# Patient Record
Sex: Female | Born: 1935 | Race: Black or African American | Hispanic: No | Smoking: Current some day smoker
Health system: Southern US, Community
[De-identification: ages and names within clinical notes are randomized; demographics above are authoritative.]

## PROBLEM LIST (undated history)

## (undated) DIAGNOSIS — Z5189 Encounter for other specified aftercare: Secondary | ICD-10-CM

## (undated) DIAGNOSIS — Z9289 Personal history of other medical treatment: Secondary | ICD-10-CM

## (undated) DIAGNOSIS — D649 Anemia, unspecified: Secondary | ICD-10-CM

## (undated) DIAGNOSIS — I340 Nonrheumatic mitral (valve) insufficiency: Secondary | ICD-10-CM

## (undated) DIAGNOSIS — I429 Cardiomyopathy, unspecified: Secondary | ICD-10-CM

## (undated) DIAGNOSIS — N183 Chronic kidney disease, stage 3 unspecified: Secondary | ICD-10-CM

## (undated) DIAGNOSIS — I351 Nonrheumatic aortic (valve) insufficiency: Secondary | ICD-10-CM

## (undated) DIAGNOSIS — I10 Essential (primary) hypertension: Secondary | ICD-10-CM

## (undated) DIAGNOSIS — I509 Heart failure, unspecified: Secondary | ICD-10-CM

## (undated) DIAGNOSIS — C50919 Malignant neoplasm of unspecified site of unspecified female breast: Secondary | ICD-10-CM

## (undated) HISTORY — DX: Personal history of other medical treatment: Z92.89

## (undated) HISTORY — DX: Encounter for other specified aftercare: Z51.89

## (undated) HISTORY — DX: Malignant neoplasm of unspecified site of unspecified female breast: C50.919

## (undated) HISTORY — DX: Nonrheumatic mitral (valve) insufficiency: I34.0

## (undated) HISTORY — DX: Cardiomyopathy, unspecified: I42.9

## (undated) HISTORY — DX: Nonrheumatic aortic (valve) insufficiency: I35.1

## (undated) HISTORY — PX: MASTECTOMY: SHX3

## (undated) SURGERY — VIDEO BRONCHOSCOPY WITHOUT FLUORO
Anesthesia: General

---

## 2002-12-27 ENCOUNTER — Encounter (INDEPENDENT_AMBULATORY_CARE_PROVIDER_SITE_OTHER): Payer: Self-pay | Admitting: *Deleted

## 2002-12-27 LAB — CONVERTED CEMR LAB

## 2004-06-24 ENCOUNTER — Encounter: Admission: RE | Admit: 2004-06-24 | Discharge: 2004-06-24 | Payer: Self-pay | Admitting: Family Medicine

## 2004-07-09 ENCOUNTER — Encounter: Admission: RE | Admit: 2004-07-09 | Discharge: 2004-07-09 | Payer: Self-pay | Admitting: Family Medicine

## 2004-08-10 ENCOUNTER — Encounter: Admission: RE | Admit: 2004-08-10 | Discharge: 2004-08-10 | Payer: Self-pay | Admitting: Family Medicine

## 2004-09-28 ENCOUNTER — Ambulatory Visit: Payer: Self-pay | Admitting: Family Medicine

## 2004-11-18 ENCOUNTER — Emergency Department (HOSPITAL_COMMUNITY): Admission: EM | Admit: 2004-11-18 | Discharge: 2004-11-18 | Payer: Self-pay | Admitting: Emergency Medicine

## 2005-01-25 ENCOUNTER — Ambulatory Visit: Payer: Self-pay | Admitting: Family Medicine

## 2005-02-27 ENCOUNTER — Emergency Department (HOSPITAL_COMMUNITY): Admission: EM | Admit: 2005-02-27 | Discharge: 2005-02-28 | Payer: Self-pay | Admitting: Emergency Medicine

## 2005-05-24 ENCOUNTER — Emergency Department (HOSPITAL_COMMUNITY): Admission: EM | Admit: 2005-05-24 | Discharge: 2005-05-24 | Payer: Self-pay | Admitting: Emergency Medicine

## 2007-02-23 DIAGNOSIS — I10 Essential (primary) hypertension: Secondary | ICD-10-CM

## 2007-02-23 DIAGNOSIS — H269 Unspecified cataract: Secondary | ICD-10-CM | POA: Insufficient documentation

## 2007-02-24 ENCOUNTER — Encounter (INDEPENDENT_AMBULATORY_CARE_PROVIDER_SITE_OTHER): Payer: Self-pay | Admitting: *Deleted

## 2007-06-13 ENCOUNTER — Other Ambulatory Visit: Admission: RE | Admit: 2007-06-13 | Discharge: 2007-06-13 | Payer: Self-pay | Admitting: Nephrology

## 2007-06-14 ENCOUNTER — Encounter: Admission: RE | Admit: 2007-06-14 | Discharge: 2007-06-14 | Payer: Self-pay | Admitting: Nephrology

## 2008-07-05 ENCOUNTER — Encounter: Admission: RE | Admit: 2008-07-05 | Discharge: 2008-07-05 | Payer: Self-pay | Admitting: Nephrology

## 2008-12-06 ENCOUNTER — Encounter: Admission: RE | Admit: 2008-12-06 | Discharge: 2008-12-06 | Payer: Self-pay | Admitting: Nephrology

## 2010-03-12 ENCOUNTER — Encounter: Admission: RE | Admit: 2010-03-12 | Discharge: 2010-03-12 | Payer: Self-pay | Admitting: Nephrology

## 2010-03-16 ENCOUNTER — Emergency Department (HOSPITAL_COMMUNITY): Admission: EM | Admit: 2010-03-16 | Discharge: 2010-03-16 | Payer: Self-pay | Admitting: Emergency Medicine

## 2010-05-05 ENCOUNTER — Ambulatory Visit: Payer: Self-pay | Admitting: Vascular Surgery

## 2010-11-27 ENCOUNTER — Encounter: Admission: RE | Admit: 2010-11-27 | Discharge: 2010-11-27 | Payer: Self-pay | Admitting: Nephrology

## 2011-05-08 ENCOUNTER — Emergency Department (HOSPITAL_COMMUNITY)
Admission: EM | Admit: 2011-05-08 | Discharge: 2011-05-08 | Disposition: A | Discharge status: Home / Self Care | Payer: Medicare Other | Attending: Emergency Medicine | Admitting: Emergency Medicine

## 2011-05-08 ENCOUNTER — Emergency Department (HOSPITAL_COMMUNITY): Payer: Medicare Other

## 2011-05-08 DIAGNOSIS — R071 Chest pain on breathing: Secondary | ICD-10-CM | POA: Insufficient documentation

## 2011-05-08 DIAGNOSIS — R0789 Other chest pain: Secondary | ICD-10-CM | POA: Insufficient documentation

## 2011-05-08 LAB — DIFFERENTIAL
Basophils Absolute: 0 10*3/uL (ref 0.0–0.1)
Basophils Relative: 0 % (ref 0–1)
Eosinophils Absolute: 0.1 10*3/uL (ref 0.0–0.7)
Eosinophils Relative: 1 % (ref 0–5)
Lymphocytes Relative: 19 % (ref 12–46)
Lymphs Abs: 1.6 10*3/uL (ref 0.7–4.0)
Monocytes Absolute: 0.6 10*3/uL (ref 0.1–1.0)
Monocytes Relative: 8 % (ref 3–12)
Neutro Abs: 6.2 10*3/uL (ref 1.7–7.7)
Neutrophils Relative %: 73 % (ref 43–77)

## 2011-05-08 LAB — TROPONIN I
Troponin I: <0.3 ng/mL (ref <0.30)
Troponin I: <0.3 ng/mL (ref <0.30)

## 2011-05-08 LAB — POCT I-STAT, CHEM 8
BUN: 17 mg/dL (ref 6–23)
Calcium, Ion: 1.23 mmol/L (ref 1.12–1.32)
Chloride: 105 mEq/L (ref 96–112)
Creatinine, Ser: 1 mg/dL (ref 0.4–1.2)
Glucose, Bld: 115 mg/dL — ABNORMAL HIGH (ref 70–99)
HCT: 34 % — ABNORMAL LOW (ref 36.0–46.0)
Hemoglobin: 11.6 g/dL — ABNORMAL LOW (ref 12.0–15.0)
Potassium: 4.3 mEq/L (ref 3.5–5.1)
Sodium: 141 mEq/L (ref 135–145)
TCO2: 29 mmol/L (ref 0–100)

## 2011-05-08 LAB — CBC
HCT: 33.2 % — ABNORMAL LOW (ref 36.0–46.0)
Hemoglobin: 11 g/dL — ABNORMAL LOW (ref 12.0–15.0)
MCH: 29.3 pg (ref 26.0–34.0)
MCHC: 33.1 g/dL (ref 30.0–36.0)
MCV: 88.5 fL (ref 78.0–100.0)
Platelets: 212 10*3/uL (ref 150–400)
RBC: 3.75 MIL/uL — ABNORMAL LOW (ref 3.87–5.11)
RDW: 13.7 % (ref 11.5–15.5)
WBC: 8.4 10*3/uL (ref 4.0–10.5)

## 2011-05-08 LAB — POCT CARDIAC MARKERS
CKMB, poc: 1.3 ng/mL (ref 1.0–8.0)
Myoglobin, poc: 74.5 ng/mL (ref 12–200)
Troponin i, poc: 0.19 ng/mL — ABNORMAL HIGH (ref 0.00–0.09)

## 2011-05-08 LAB — CK TOTAL AND CKMB (NOT AT ARMC)
CK, MB: 2 ng/mL (ref 0.3–4.0)
CK, MB: 2.1 ng/mL (ref 0.3–4.0)
Relative Index: INVALID (ref 0.0–2.5)
Relative Index: INVALID (ref 0.0–2.5)
Total CK: 76 U/L (ref 7–177)
Total CK: 75 U/L (ref 7–177)

## 2011-05-08 LAB — D-DIMER, QUANTITATIVE: D-Dimer, Quant: 1.59 ug/mL-FEU — ABNORMAL HIGH (ref 0.00–0.48)

## 2011-05-08 MED ORDER — IOHEXOL 300 MG/ML  SOLN
60.0000 mL | Freq: Once | INTRAMUSCULAR | Status: AC | PRN
  Administered 2011-05-08: 60 mL via INTRAVENOUS

## 2011-05-11 NOTE — Consult Note (Signed)
NEW PATIENT CONSULTATION   Downs, Valerie E  DOB:  Aug 03, 1930                                       05/05/2010  ZOXWR#:[id number]   The patient is a 77 year old female referred by Dr. Suzette Battiest for  circulation evaluation.  She has a fungus problem in the right first  toenail and needs removal of the nail.  He was concerned about her  healing potential.  She denies any claudication symptoms but does not  ambulate long distances.  She denies history of rest pain, nonhealing  ulcers, infection or other vascular problems.   CHRONIC MEDICAL PROBLEMS:  1. Proteinuria followed by Dr. Bascom Levels.  2. Previous left mastectomy for cancer.  3. Negative for diabetes, COPD, hypertension, hyperlipidemia, coronary      artery disease or stroke.   FAMILY HISTORY:  Negative for coronary artery disease, diabetes and  stroke.   PAST SURGICAL HISTORY:  Includes left mastectomy and cataract surgery.   SOCIAL HISTORY:  The patient has 10 children.  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  She smoked 4-5 cigarettes per day most of her  life until 18 months ago when she quit.  She does not use alcohol.   REVIEW OF SYSTEMS:  Positive for weight loss.  Negative for anorexia.  Negative for chest pain, dyspnea on exertion, wheezing, chronic  bronchitis.  Does have some kidney problems followed by Dr. Bascom Levels with  proteinuria, muscle pain.  All other systems in the review of systems  are negative.   PHYSICAL EXAMINATION:  Vital signs:  Blood pressure 148/73, heart rate  76, temperature 98.  General:  She is a well-developed, well-nourished  elderly female in no apparent distress.  Alert and oriented x3.  HEENT:  Exam is normal.  EOMs intact.  Lungs:  Clear to auscultation.  No  rhonchi or wheezing.  Cardiovascular:  Regular rhythm.  No murmurs.  Carotid pulses 3+.  No audible bruits.  Abdomen:  Soft, nontender with  no masses.  She has 3+ femoral, 2+ popliteal and 2+ dorsalis pedis  pulses palpable bilaterally.  Both feet are well-perfused.  There is no  distal edema noted.  Musculoskeletal:  Exam is free of major  deformities.  Neurological:  Normal.  Skin:  Free of rashes.  She does  have a hypertrophic nail of her left first toe but no evidence of  adjacent cellulitis is noted.   Today I ordered lower extremity arterial Doppler studies which I  reviewed and interpreted.  She has biphasic flow in posterior tibial  artery of both ankles with ABIs of 0.96 on the left and 0.94 on the  right.   I have reassured her regarding these findings.  I do think her  circulation is adequate for removal of the left first toe nail as much  as can be determined and would be happy to see her in the future as  necessary.     Quita Skye Hart [place], M.D.  Electronically Signed   JDL/MEDQ  D:  05/05/2010  T:  05/06/2010  Job:  [id number]   cc:   Nicole Kindred, M.D.

## 2011-06-17 ENCOUNTER — Ambulatory Visit
Admission: RE | Admit: 2011-06-17 | Discharge: 2011-06-17 | Disposition: A | Discharge status: Home / Self Care | Payer: Medicare Other | Source: Ambulatory Visit | Attending: Nephrology | Admitting: Nephrology

## 2011-06-17 ENCOUNTER — Other Ambulatory Visit: Payer: Self-pay | Admitting: Nephrology

## 2011-06-17 DIAGNOSIS — R52 Pain, unspecified: Secondary | ICD-10-CM

## 2013-02-16 ENCOUNTER — Emergency Department (HOSPITAL_COMMUNITY): Payer: Medicare Other

## 2013-02-16 ENCOUNTER — Encounter (HOSPITAL_COMMUNITY): Payer: Self-pay | Admitting: *Deleted

## 2013-02-16 ENCOUNTER — Emergency Department (HOSPITAL_COMMUNITY)
Admission: EM | Admit: 2013-02-16 | Discharge: 2013-02-16 | Disposition: A | Discharge status: Home / Self Care | Payer: Medicare Other | Attending: Emergency Medicine | Admitting: Emergency Medicine

## 2013-02-16 DIAGNOSIS — R29898 Other symptoms and signs involving the musculoskeletal system: Secondary | ICD-10-CM | POA: Insufficient documentation

## 2013-02-16 DIAGNOSIS — Z853 Personal history of malignant neoplasm of breast: Secondary | ICD-10-CM | POA: Insufficient documentation

## 2013-02-16 DIAGNOSIS — G8929 Other chronic pain: Secondary | ICD-10-CM | POA: Insufficient documentation

## 2013-02-16 DIAGNOSIS — M25562 Pain in left knee: Secondary | ICD-10-CM

## 2013-02-16 DIAGNOSIS — M25569 Pain in unspecified knee: Secondary | ICD-10-CM | POA: Insufficient documentation

## 2013-02-16 DIAGNOSIS — Z87891 Personal history of nicotine dependence: Secondary | ICD-10-CM | POA: Insufficient documentation

## 2013-02-16 LAB — CBC WITH DIFFERENTIAL/PLATELET
Basophils Relative: 0 % (ref 0–1)
Eosinophils Absolute: 0.1 10*3/uL (ref 0.0–0.7)
Eosinophils Relative: 1 % (ref 0–5)
HCT: 31.8 % — ABNORMAL LOW (ref 36.0–46.0)
Hemoglobin: 10.1 g/dL — ABNORMAL LOW (ref 12.0–15.0)
MCH: 28.5 pg (ref 26.0–34.0)
MCHC: 31.8 g/dL (ref 30.0–36.0)
MCV: 89.8 fL (ref 78.0–100.0)
Monocytes Absolute: 0.6 10*3/uL (ref 0.1–1.0)
Monocytes Relative: 12 % (ref 3–12)
RDW: 13.9 % (ref 11.5–15.5)

## 2013-02-16 LAB — D-DIMER, QUANTITATIVE: D-Dimer, Quant: 3.36 ug{FEU}/mL — ABNORMAL HIGH (ref 0.00–0.48)

## 2013-02-16 LAB — POCT I-STAT, CHEM 8
BUN: 21 mg/dL (ref 6–23)
Calcium, Ion: 1.28 mmol/L (ref 1.13–1.30)
Creatinine, Ser: 1.1 mg/dL (ref 0.50–1.10)
Glucose, Bld: 94 mg/dL (ref 70–99)
Sodium: 141 mEq/L (ref 135–145)
TCO2: 26 mmol/L (ref 0–100)

## 2013-02-16 LAB — PROTIME-INR
INR: 1.07 (ref 0.00–1.49)
Prothrombin Time: 13.8 s (ref 11.6–15.2)

## 2013-02-16 MED ORDER — OXYCODONE-ACETAMINOPHEN 5-325 MG PO TABS
1.0000 | ORAL_TABLET | Freq: Once | ORAL | Status: AC
  Administered 2013-02-16: 1 via ORAL
  Filled 2013-02-16: qty 1

## 2013-02-16 MED ORDER — OXYCODONE-ACETAMINOPHEN 5-325 MG PO TABS: 1.0000 | ORAL_TABLET | Freq: Four times a day (QID) | ORAL | Status: DC | PRN

## 2013-02-16 NOTE — ED Notes (Signed)
Pt reports chronic knee pain, today pt reports knot formed behind left knee and pain 8/10.

## 2013-02-16 NOTE — ED Provider Notes (Signed)
History     CSN: [id number]  Arrival date & time 02/16/13  1652   First MD Initiated Contact with Patient 02/16/13 1957      Chief Complaint  Patient presents with  . Knee Pain    (Consider location/radiation/quality/duration/timing/severity/associated sxs/prior treatment) HPI This 79 year old female has chronic left knee pain intermittently, for several months she has had intermittent left knee pain pushed in her for days to weeks at a time, sometimes her left knee does not hurt at all but quite often it does, she is a flare up of left knee pain for last several days, today she felt a small nontender lump in her popliteal region and her family was concerned about a blood clots she came to the ED, she is able to walk with a cane with a limp, she is no weakness or numbness to the leg there is no color change or swelling to the leg, she has no thigh pain or calf pain, she is no edema to the leg, she is no recent immobilization, she is no chest pain shortness of breath lightheadedness abdominal pain or other concerns. There is no treatment prior to arrival. Past Medical History  Diagnosis Date  . Cancer     breast    Past Surgical History  Procedure Laterality Date  . Mastectomy      left    History reviewed. No pertinent family history.  History  Substance Use Topics  . Smoking status: Former Games developer  . Smokeless tobacco: Not on file  . Alcohol Use: No    OB History   Grav Para Term Preterm Abortions TAB SAB Ect Mult Living                  Review of Systems 10 Systems reviewed and are negative for acute change except as noted in the HPI. Allergies  Penicillins  Home Medications   Current Outpatient Rx  Name  Route  Sig  Dispense  Refill  . oxyCODONE-acetaminophen (PERCOCET) 5-325 MG per tablet   Oral   Take 1 tablet by mouth every 6 (six) hours as needed for pain.   15 tablet   0     BP 148/71  Pulse 72  Temp(Src) 98.1 F (36.7 C) (Oral)  Resp 20  SpO2  96%  Physical Exam  Nursing note and vitals reviewed. Constitutional:  Awake, alert, nontoxic appearance.  HENT:  Head: Atraumatic.  Eyes: Right eye exhibits no discharge. Left eye exhibits no discharge.  Neck: Neck supple.  Pulmonary/Chest: Effort normal. She exhibits no tenderness.  Abdominal: Soft. There is no tenderness. There is no rebound.  Musculoskeletal: She exhibits tenderness. She exhibits no edema.  Baseline ROM, no obvious new focal weakness. Both arms and right leg are nontender. Left leg is nontender at the thigh calf ankle and foot. Left leg has minimal tenderness to the knee medially as well as laterally with no swelling appreciated except for a subcutaneous nodule in the popliteal region approximately 2-3 cm diameter which is nontender, there is no erythema to the skin of the knee, she has negative Lachman's testing negative McMurray's testing no laxity with varus or valgus stress testing full extension as well as good flexion past 90, her calves are symmetric, left foot his dorsalis pedis pulse intact, cap refill less than 2 seconds normal light touch good movement with good strength in her left leg  Neurological: She is alert.  Mental status and motor strength appears baseline for patient and situation.  Skin: No rash noted.  Psychiatric: She has a normal mood and affect.    ED Course  Procedures (including critical care time)  Labs Reviewed  D-DIMER, QUANTITATIVE - Abnormal; Notable for the following:    D-Dimer, Quant 3.36 (*)    All other components within normal limits  CBC WITH DIFFERENTIAL - Abnormal; Notable for the following:    RBC 3.54 (*)    Hemoglobin 10.1 (*)    HCT 31.8 (*)    Neutrophils Relative 38 (*)    Lymphocytes Relative 49 (*)    All other components within normal limits  POCT I-STAT, CHEM 8 - Abnormal; Notable for the following:    Hemoglobin 10.9 (*)    HCT 32.0 (*)    All other components within normal limits  PROTIME-INR   No  results found.   1. Left knee pain       MDM   Pt stable in ED with no significant deterioration in condition.  Patient / Family / Caregiver informed of clinical course, understand medical decision-making process, and agree with plan. I doubt any other EMC precluding discharge at this time including, but not necessarily limited to the following:DVT, SBI.      Hurman Horn, MD 02/24/13 2207

## 2013-02-16 NOTE — ED Notes (Signed)
MD at bedside. 

## 2013-02-17 ENCOUNTER — Emergency Department (HOSPITAL_COMMUNITY)
Admission: EM | Admit: 2013-02-17 | Discharge: 2013-02-17 | Discharge status: Against Medical Advice | Payer: Medicare Other

## 2013-02-17 ENCOUNTER — Ambulatory Visit (HOSPITAL_COMMUNITY)
Admission: RE | Admit: 2013-02-17 | Discharge: 2013-02-17 | Disposition: A | Discharge status: Home / Self Care | Payer: Medicare Other | Source: Ambulatory Visit | Attending: Emergency Medicine | Admitting: Emergency Medicine

## 2013-02-17 DIAGNOSIS — M7989 Other specified soft tissue disorders: Secondary | ICD-10-CM | POA: Insufficient documentation

## 2013-02-17 DIAGNOSIS — M79609 Pain in unspecified limb: Secondary | ICD-10-CM

## 2013-02-19 NOTE — Progress Notes (Signed)
VASCULAR LAB PRELIMINARY  PRELIMINARY  PRELIMINARY  PRELIMINARY  Left lower extremity venous Doppler completed.    Preliminary report:  There is no DVT or SVT noted in the left lower extremity.  Irvin Bastin, RVT 02/19/2013, 10:23 AM

## 2013-07-26 ENCOUNTER — Inpatient Hospital Stay (HOSPITAL_COMMUNITY)
Admission: EM | Admit: 2013-07-26 | Discharge: 2013-07-29 | DRG: 292 | Disposition: A | Discharge status: Home / Self Care | Payer: Medicare Other | Attending: Internal Medicine | Admitting: Internal Medicine

## 2013-07-26 ENCOUNTER — Emergency Department (HOSPITAL_COMMUNITY): Payer: Medicare Other

## 2013-07-26 ENCOUNTER — Encounter (HOSPITAL_COMMUNITY): Payer: Self-pay | Admitting: Emergency Medicine

## 2013-07-26 DIAGNOSIS — I10 Essential (primary) hypertension: Secondary | ICD-10-CM | POA: Diagnosis present

## 2013-07-26 DIAGNOSIS — D649 Anemia, unspecified: Secondary | ICD-10-CM

## 2013-07-26 DIAGNOSIS — Z880 Allergy status to penicillin: Secondary | ICD-10-CM

## 2013-07-26 DIAGNOSIS — I43 Cardiomyopathy in diseases classified elsewhere: Secondary | ICD-10-CM | POA: Diagnosis present

## 2013-07-26 DIAGNOSIS — I110 Hypertensive heart disease with heart failure: Principal | ICD-10-CM | POA: Diagnosis present

## 2013-07-26 DIAGNOSIS — Z87891 Personal history of nicotine dependence: Secondary | ICD-10-CM

## 2013-07-26 DIAGNOSIS — R42 Dizziness and giddiness: Secondary | ICD-10-CM | POA: Diagnosis present

## 2013-07-26 DIAGNOSIS — D62 Acute posthemorrhagic anemia: Secondary | ICD-10-CM | POA: Insufficient documentation

## 2013-07-26 DIAGNOSIS — Z853 Personal history of malignant neoplasm of breast: Secondary | ICD-10-CM

## 2013-07-26 DIAGNOSIS — Z9010 Acquired absence of unspecified breast and nipple: Secondary | ICD-10-CM

## 2013-07-26 DIAGNOSIS — N390 Urinary tract infection, site not specified: Secondary | ICD-10-CM | POA: Diagnosis present

## 2013-07-26 DIAGNOSIS — I509 Heart failure, unspecified: Secondary | ICD-10-CM | POA: Diagnosis present

## 2013-07-26 DIAGNOSIS — Z8249 Family history of ischemic heart disease and other diseases of the circulatory system: Secondary | ICD-10-CM

## 2013-07-26 DIAGNOSIS — E440 Moderate protein-calorie malnutrition: Secondary | ICD-10-CM | POA: Diagnosis present

## 2013-07-26 DIAGNOSIS — I5021 Acute systolic (congestive) heart failure: Secondary | ICD-10-CM | POA: Diagnosis present

## 2013-07-26 LAB — URINALYSIS, ROUTINE W REFLEX MICROSCOPIC
Nitrite: NEGATIVE
Specific Gravity, Urine: 1.022 (ref 1.005–1.030)
Urobilinogen, UA: 1 mg/dL (ref 0.0–1.0)

## 2013-07-26 LAB — CBC WITH DIFFERENTIAL/PLATELET
Basophils Absolute: 0 10*3/uL (ref 0.0–0.1)
Eosinophils Absolute: 0 10*3/uL (ref 0.0–0.7)
Eosinophils Relative: 1 % (ref 0–5)
Eosinophils Relative: 1 % (ref 0–5)
Lymphocytes Relative: 27 % (ref 12–46)
Lymphs Abs: 2.6 10*3/uL (ref 0.7–4.0)
MCH: 26.5 pg (ref 26.0–34.0)
MCHC: 30.6 g/dL (ref 30.0–36.0)
MCV: 86.4 fL (ref 78.0–100.0)
MCV: 86.8 fL (ref 78.0–100.0)
Platelets: 245 10*3/uL (ref 150–400)
Platelets: 237 10*3/uL (ref 150–400)
RBC: 2.87 MIL/uL — ABNORMAL LOW (ref 3.87–5.11)
RDW: 14.9 % (ref 11.5–15.5)
WBC: 6.4 10*3/uL (ref 4.0–10.5)

## 2013-07-26 LAB — COMPREHENSIVE METABOLIC PANEL
ALT: 11 U/L (ref 0–35)
AST: 15 U/L (ref 0–37)
Albumin: 2.6 g/dL — ABNORMAL LOW (ref 3.5–5.2)
BUN: 14 mg/dL (ref 6–23)
CO2: 26 mEq/L (ref 19–32)
Calcium: 8.9 mg/dL (ref 8.4–10.5)
Calcium: 8.6 mg/dL (ref 8.4–10.5)
GFR calc Af Amer: 68 mL/min — ABNORMAL LOW (ref >90)
GFR calc non Af Amer: 57 mL/min — ABNORMAL LOW (ref >90)
Glucose, Bld: 81 mg/dL (ref 70–99)
Sodium: 139 mEq/L (ref 135–145)
Sodium: 140 mEq/L (ref 135–145)
Total Protein: 6.5 g/dL (ref 6.0–8.3)
Total Protein: 6.4 g/dL (ref 6.0–8.3)

## 2013-07-26 LAB — ABO/RH: ABO/RH(D): O POS

## 2013-07-26 LAB — PROTIME-INR: INR: 1.13 (ref 0.00–1.49)

## 2013-07-26 LAB — OCCULT BLOOD, POC DEVICE: Fecal Occult Bld: NEGATIVE

## 2013-07-26 LAB — PRO B NATRIURETIC PEPTIDE: Pro B Natriuretic peptide (BNP): 13350 pg/mL — ABNORMAL HIGH (ref 0–450)

## 2013-07-26 LAB — MRSA PCR SCREENING: MRSA by PCR: NEGATIVE

## 2013-07-26 LAB — PREPARE RBC (CROSSMATCH)

## 2013-07-26 LAB — TROPONIN I
Troponin I: <0.3 ng/mL (ref <0.30)
Troponin I: <0.3 ng/mL (ref <0.30)

## 2013-07-26 LAB — URINE MICROSCOPIC-ADD ON

## 2013-07-26 LAB — MAGNESIUM: Magnesium: 1.7 mg/dL (ref 1.5–2.5)

## 2013-07-26 MED ORDER — METOPROLOL TARTRATE 12.5 MG HALF TABLET
12.5000 mg | ORAL_TABLET | Freq: Two times a day (BID) | ORAL | Status: DC
  Administered 2013-07-26 – 2013-07-29 (×6): 12.5 mg via ORAL
  Filled 2013-07-26 (×8): qty 1

## 2013-07-26 MED ORDER — HYDRALAZINE HCL 20 MG/ML IJ SOLN
10.0000 mg | Freq: Four times a day (QID) | INTRAMUSCULAR | Status: DC | PRN
  Administered 2013-07-27: 10 mg via INTRAVENOUS
  Filled 2013-07-26: qty 1

## 2013-07-26 MED ORDER — ONDANSETRON HCL 4 MG/2ML IJ SOLN
4.0000 mg | Freq: Once | INTRAMUSCULAR | Status: AC
  Administered 2013-07-26: 4 mg via INTRAVENOUS
  Filled 2013-07-26: qty 2

## 2013-07-26 MED ORDER — ACETAMINOPHEN 325 MG PO TABS: 650.0000 mg | ORAL_TABLET | ORAL | Status: DC | PRN

## 2013-07-26 MED ORDER — LEVOFLOXACIN IN D5W 750 MG/150ML IV SOLN
750.0000 mg | Freq: Once | INTRAVENOUS | Status: AC
  Administered 2013-07-26: 750 mg via INTRAVENOUS
  Filled 2013-07-26: qty 150

## 2013-07-26 MED ORDER — LEVOFLOXACIN IN D5W 750 MG/150ML IV SOLN
750.0000 mg | INTRAVENOUS | Status: DC
  Filled 2013-07-26: qty 150

## 2013-07-26 MED ORDER — FUROSEMIDE 10 MG/ML IJ SOLN
40.0000 mg | Freq: Two times a day (BID) | INTRAMUSCULAR | Status: DC
  Administered 2013-07-27 – 2013-07-28 (×3): 40 mg via INTRAVENOUS
  Filled 2013-07-26 (×6): qty 4

## 2013-07-26 MED ORDER — SODIUM CHLORIDE 0.9 % IJ SOLN
3.0000 mL | Freq: Two times a day (BID) | INTRAMUSCULAR | Status: DC
  Administered 2013-07-27 – 2013-07-29 (×5): 3 mL via INTRAVENOUS

## 2013-07-26 MED ORDER — SODIUM CHLORIDE 0.9 % IV BOLUS (SEPSIS)
500.0000 mL | Freq: Once | INTRAVENOUS | Status: AC
  Administered 2013-07-26: 1000 mL via INTRAVENOUS

## 2013-07-26 MED ORDER — ONDANSETRON HCL 4 MG/2ML IJ SOLN: 4.0000 mg | Freq: Four times a day (QID) | INTRAMUSCULAR | Status: DC | PRN

## 2013-07-26 MED ORDER — FUROSEMIDE 10 MG/ML IJ SOLN
40.0000 mg | Freq: Once | INTRAMUSCULAR | Status: AC
  Administered 2013-07-26: 40 mg via INTRAVENOUS
  Filled 2013-07-26: qty 4

## 2013-07-26 MED ORDER — POTASSIUM CHLORIDE CRYS ER 20 MEQ PO TBCR
20.0000 meq | EXTENDED_RELEASE_TABLET | Freq: Every day | ORAL | Status: DC
  Filled 2013-07-26: qty 1

## 2013-07-26 MED ORDER — LISINOPRIL 5 MG PO TABS
5.0000 mg | ORAL_TABLET | Freq: Every day | ORAL | Status: DC
  Administered 2013-07-27: 5 mg via ORAL
  Filled 2013-07-26 (×2): qty 1

## 2013-07-26 MED ORDER — SODIUM CHLORIDE 0.9 % IJ SOLN
3.0000 mL | INTRAMUSCULAR | Status: DC | PRN
  Administered 2013-07-29: 3 mL via INTRAVENOUS

## 2013-07-26 MED ORDER — SODIUM CHLORIDE 0.9 % IV SOLN: 250.0000 mL | INTRAVENOUS | Status: DC | PRN

## 2013-07-26 NOTE — ED Notes (Signed)
Pt c/o nausea, dizziness, and weakness x1 month.  Denies vomiting.  Reports SOB x2 weeks.  Denies cardiac hx.

## 2013-07-26 NOTE — ED Notes (Signed)
Received page for room@[id number] 

## 2013-07-26 NOTE — Progress Notes (Signed)
Patient does not have a computer at home. Pt does not want to sign up for My Chart. She wants to think about family member being a proxy for her. Briscoe Burns BSN, RN-BC Admissions RN  07/26/2013 8:26 PM

## 2013-07-26 NOTE — ED Provider Notes (Signed)
CSN: [id number]     Arrival date & time 07/26/13  1633 History     First MD Initiated Contact with Patient 07/26/13 1648     Chief Complaint  Patient presents with  . Nausea  . Dizziness   (Consider location/radiation/quality/duration/timing/severity/associated sxs/prior Treatment) HPI Comments: 79 yo female with breast CA hx (not active) presents with intermittent nausea, weakness and lightheaded for one month.  Pt has not seen a physician. No heart hx. Wt loss.  SOB is more general fatigue, at times exertional.  Pt has had a few episodes of atypical anterior cp lasting 5 minutes. Sxs not necessarily with exertion.  NO diaphoresis.  No headache.  NO fevers.  IMproves with time.  Pt still has gb.  The history is provided by the patient.    Past Medical History  Diagnosis Date  . Cancer     breast   Past Surgical History  Procedure Laterality Date  . Mastectomy      left   No family history on file. History  Substance Use Topics  . Smoking status: Former Games developer  . Smokeless tobacco: Not on file  . Alcohol Use: No   OB History   Grav Para Term Preterm Abortions TAB SAB Ect Mult Living                 Review of Systems  Constitutional: Positive for appetite change and fatigue. Negative for fever and chills.  HENT: Negative for neck pain.   Eyes: Negative for visual disturbance.  Respiratory: Positive for shortness of breath. Cough: not currently.   Cardiovascular: Negative for chest pain.  Gastrointestinal: Positive for nausea. Negative for vomiting, abdominal pain and blood in stool.  Genitourinary: Negative for dysuria and flank pain.  Musculoskeletal: Negative for back pain.  Skin: Negative for rash.  Neurological: Positive for weakness and light-headedness. Negative for headaches.    Allergies  Penicillins  Home Medications  No current outpatient prescriptions on file. BP 174/69  Pulse 86  Temp(Src) 99.2 F (37.3 C) (Oral)  Resp 14  SpO2 100% Physical  Exam  Nursing note and vitals reviewed. Constitutional: She is oriented to person, place, and time. She appears well-developed and well-nourished.  HENT:  Head: Normocephalic and atraumatic.  Dry mm mild  Eyes: Conjunctivae are normal. Right eye exhibits no discharge. Left eye exhibits no discharge.  Neck: Normal range of motion. Neck supple. No tracheal deviation present.  Cardiovascular: Normal rate and regular rhythm.   Pulmonary/Chest: Effort normal. She has rales (crackles at bases).  Abdominal: Soft. She exhibits no distension. There is no tenderness. There is no guarding.  Musculoskeletal: She exhibits no edema and no tenderness.  Neurological: She is alert and oriented to person, place, and time. No cranial nerve deficit.  Skin: Skin is warm. No rash noted.  Psychiatric: She has a normal mood and affect.    ED Course   Procedures (including critical care time)   EMERGENCY DEPARTMENT [place] CARDIAC EXAM "Study: Limited Ultrasound of the heart and pericardium"  INDICATIONS:Dyspnea Multiple views of the heart and pericardium are obtained with a multi-frequency probe.  PERFORMED BM:WUXLKG  IMAGES ARCHIVED?: Yes  FINDINGS: Small effusion and Decreased contractility  LIMITATIONS:  none  VIEWS USED: Subcostal 4 chamber, Parasternal long axis, Parasternal short axis and Apical 4 chamber   INTERPRETATION: Grossly decr EF with slight cardiac effusion   CRITICAL CARE Performed by: Enid Skeens   Total critical care time: 40 min  Critical care time was exclusive  of separately billable procedures and treating other patients.  Critical care was necessary to treat or prevent imminent or life-threatening deterioration.  Critical care was time spent personally by me on the following activities: development of treatment plan with patient and/or surrogate as well as nursing, discussions with consultants, evaluation of patient's response to treatment, examination of patient,  obtaining history from patient or surrogate, ordering and performing treatments and interventions, ordering and review of laboratory studies, ordering and review of radiographic studies, pulse oximetry and re-evaluation of patient's condition.   Labs Reviewed  CBC WITH DIFFERENTIAL - Abnormal; Notable for the following:    RBC 2.87 (*)    Hemoglobin 7.6 (*)    HCT 24.8 (*)    All other components within normal limits  COMPREHENSIVE METABOLIC PANEL - Abnormal; Notable for the following:    Albumin 2.7 (*)    GFR calc non Af Amer 58 (*)    GFR calc Af Amer 68 (*)    All other components within normal limits  URINALYSIS, ROUTINE W REFLEX MICROSCOPIC - Abnormal; Notable for the following:    APPearance CLOUDY (*)    Hgb urine dipstick TRACE (*)    Protein, ur >300 (*)    Leukocytes, UA MODERATE (*)    All other components within normal limits  URINE MICROSCOPIC-ADD ON - Abnormal; Notable for the following:    Squamous Epithelial / LPF FEW (*)    Bacteria, UA MANY (*)    Casts HYALINE CASTS (*)    All other components within normal limits  PRO B NATRIURETIC PEPTIDE - Abnormal; Notable for the following:    Pro B Natriuretic peptide (BNP) 13354.0 (*)    All other components within normal limits  URINE CULTURE  TROPONIN I  OCCULT BLOOD X 1 CARD TO LAB, STOOL  OCCULT BLOOD, POC DEVICE  PREPARE RBC (CROSSMATCH)  TYPE AND SCREEN   No results found. No diagnosis found.  MDM   Multiple vague sxs from patient.  General weakness/ fatigue is main sx. General workup revealed new onset CHF, anemia without any recent GI bleeding. Pt comfortable on recheck.  Discussed multiple findings. Lasix given in ED.   Date: 07/26/2013  Rate: *78  Rhythm: normal sinus rhythm  QRS Axis: indeterminate  Intervals: QT prolonged  ST/T Wave abnormalities: nonspecific ST changes  Conduction Disutrbances:none  PVCs  Narrative Interpretation:   Old EKG Reviewed: similar   Bedside US very mild  pericardial effusion- no clinically significant, LVH and grossly decr EF. Paged cardiology and hospitalist.  Ordered one unit of blood to be given slowly over 4 hrs.   UTI - culture and abx.  Multiple discussions with family, cardiology and hospitalist.  Pt comfortable on recheck and prefers to stay locally. Discussed with [place].  Dg Chest 2 View  07/26/2013   *RADIOLOGY REPORT*  Clinical Data: Nausea, dizziness, prior breast cancer  CHEST - 2 VIEW  Comparison: 06/17/2011  Findings: Heart is enlarged with vascular congestion and interstitial prominence versus early developing edema.  No large effusion or pneumothorax.  No collapse consolidation.  No definite pneumonia.  Postop changes from left mastectomy and axillary lymph node dissection.  IMPRESSION: Cardiomegaly with vascular congestion versus early edema   Original Report Authenticated By: Judie Petit. Miles Costain, M.D.      Enid Skeens, MD 07/26/13 2107

## 2013-07-26 NOTE — Progress Notes (Addendum)
ANTIBIOTIC CONSULT NOTE - INITIAL  Pharmacy Consult for Levaquin Indication: UTI  Allergies  Allergen Reactions  . Penicillins Swelling    Patient Measurements:   Adjusted Body Weight:   Vital Signs: Temp: 99.2 F (37.3 C) (07/31 1645) Temp src: Oral (07/31 1645) BP: 144/67 mmHg (07/31 2047) Pulse Rate: 84 (07/31 2047) Intake/Output from previous day:   Intake/Output from this shift:    Labs:  Recent Labs  07/26/13 1745  WBC 7.5  HGB 7.6*  PLT 245  CREATININE 0.90   CrCl is unknown because there is no height on file for the current visit. No results found for this basename: VANCOTROUGH, VANCOPEAK, VANCORANDOM, GENTTROUGH, GENTPEAK, GENTRANDOM, TOBRATROUGH, TOBRAPEAK, TOBRARND, AMIKACINPEAK, AMIKACINTROU, AMIKACIN,  in the last 72 hours   Microbiology: No results found for this or any previous visit (from the past 720 hour(s)).  Medical History: Past Medical History  Diagnosis Date  . Cancer     breast   Assessment: 6 yoF with hx breast CA presents with 1 month of nausea, weakness, and lightheadedness. U/A positive for bacteria, leukocytes.  Pharmacy consulted to dose levaquin for UTI.  First dose levaquin already given.    D#1 Antibiotics 7/31 >> Levaquin >>  No weight available yet in [facility].   Renal: SCr 0.90, normalized CrCl = 56 WBC WNL Afebrile  Cultures:   7/31 Urine collected  Plan:  1.  Will follow-up weight and CrCl and order maintenance dose tomorrow (8/1) when CrCl known.    Haynes Hoehn, PharmD 07/26/2013 9:26 PM  Pager: [phone]  Addendum:  Wt 66.6 kg.  CrCl 44 ml/min.  Will order Levaquin 750mg  IV q 48 hours.

## 2013-07-26 NOTE — H&P (Addendum)
[facility] History and Physical  Valerie Downs FAO:[id number] DOB: 10-24-1930 DOA: 07/26/2013  Referring physician: ER physician PCP: User [facility]ricity, MD   Chief Complaint: weakness   HPI:  79 year old female with past medical history of breast cancer status post left mastectomy who presented to [facility] ED for evaluation of an ongoing progressive weakness, fatigue and shortness of breath for past 1 month prior to this admission. Patient reports experiencing shortness of breath on and off mostly pronounced with activity but present at time even at rest. Patient reported no associated fevers or chills or productive cough. Patient did endorse few episodes of intermittent substernal chest pain, about 4-5/10 in intensity lasting few minutes at the time and spontaneously resolving. She had associated nausea but no vomiting. No reports of blood in stool or urine. No diarrhea or constipation. No loss of consciousness. In ED, BP was 174/69 with HR of 86, Tmax 99.2 F and O2 saturation of 93% on room air. CBC revealed hemoglobin of 7.6 and FOBT was negative. Urinalysis was significant for many bacteria. Additionally, the patient was found to have BNP of 13,354 and bedside ECHO preliminary report showed reduced EF. CXR showed cardiomegaly with vascular congestion. Patient was given 1 dose of lasix 40 mg IV in ED and cardiology was consulted by ED physician. Patient preferred to stay in [facility] so recommended admission to stepdown unit.   Assessment and Plan:  Principal Problem:   Acute decompensated heart failure - BNP on this admission is 13,354 - CHF order set in place - started lasix 40 mg Q 12 hours; strict intake and output and daily weight; potassium supplementation ordered however low dose as potassium is now at the lower limits of normal range; replete as needed - also ordered lisinopril 5 mg daily and low dose metoprolol 12.5 mg BID (BP 174/69 and HR 86) - obtain 12 lead EKG and follow up 2 D ECHO; no  previous 2 D ECHO on file - cycle cardiac enzymes for total of 3 sets - follow up TSH level and repeat BNP  - appreciate cardiology consult and recommendations - admission to stepdown unit  Active Problems:   Acute blood loss anemia - FOBT negative, unclear etiology of anemia - hemoglobin is 7.6 on this admission - 1 unit PRBC transfusion started in ED   UTI (lower urinary tract infection) - PCN allergy; will continue Levaquin started in ED - follow up urine culture results   HYPERTENSION, BENIGN SYSTEMIC - started low dose lisinopril and metoprolol - monitor renal function  - appreciate cardiology recomendations  Manson Passey [facility] [phone]  Review of Systems:  Constitutional: Negative for fever, chills and positive for malaise/fatigue. Negative for diaphoresis.  HENT: Negative for hearing loss, ear pain, nosebleeds, congestion, sore throat, neck pain, tinnitus and ear discharge.   Eyes: Negative for blurred vision, double vision, photophobia, pain, discharge and redness.  Respiratory: Negative for cough, hemoptysis, sputum production, positive for shortness of breath, no wheezing and stridor.   Cardiovascular: positive for chest pain, no palpitations, orthopnea, claudication and leg swelling.  Gastrointestinal: positive  for nausea, negative for vomiting and abdominal pain. Negative for heartburn, constipation, blood in stool and melena.  Genitourinary: Negative for dysuria, urgency, frequency, hematuria and flank pain.  Musculoskeletal: Negative for myalgias, back pain, joint pain and falls.  Skin: Negative for itching and rash.  Neurological: Negative for dizziness and positive for weakness. Negative for tingling, tremors, sensory change, speech change, focal weakness, loss of consciousness and headaches.  Endo/Heme/Allergies: Negative for environmental allergies and polydipsia. Does not bruise/bleed easily.  Psychiatric/Behavioral: Negative for suicidal ideas. The patient is not  nervous/anxious.      Past Medical History  Diagnosis Date  . Cancer     breast   Past Surgical History  Procedure Laterality Date  . Mastectomy      left   Social History:  reports that she quit smoking about 13 months ago. Her smoking use included Cigarettes. She smoked 0.00 packs per day for 50 years. Her smokeless tobacco use includes Snuff. She reports that she does not drink alcohol or use illicit drugs.  Allergies  Allergen Reactions  . Penicillins Swelling    Family History: Family medical history significant for HTN, HLD   Prior to Admission medications   Not on File   Physical Exam: Filed Vitals:   07/26/13 1645 07/26/13 1912 07/26/13 2047  BP: 174/69 169/74 144/67  Pulse: 86 85 84  Temp: 99.2 F (37.3 C)    TempSrc: Oral    Resp: 14 18 16   SpO2: 100% 93% 93%    Physical Exam  Constitutional: Appears well-developed and well-nourished. No distress.  HENT: Normocephalic. External right and left ear normal. Oropharynx is clear and moist.  Eyes: Conjunctivae and EOM are normal. PERRLA, no scleral icterus.  Neck: Normal ROM. Neck supple. No JVD. No tracheal deviation. No thyromegaly.  CVS: RRR, S1/S2 appreciate, basilar crackles Pulmonary: Effort and breath sounds normal, no stridor, rhonchi, wheezes, rales.  Abdominal: Soft. BS +,  no distension, tenderness, rebound or guarding.  Musculoskeletal: Normal range of motion. Trace LE edema and no tenderness.  Lymphadenopathy: No lymphadenopathy noted, cervical, inguinal. Neuro: Alert. No focal neurologic deficits Skin: Skin is warm and dry. No rash noted. Not diaphoretic. No erythema. No pallor.  Psychiatric: Normal mood and affect. Behavior, judgment, thought content normal.   Labs on Admission:  Basic Metabolic Panel:  Recent Labs Lab 07/26/13 1745  NA 139  K 3.5  CL 104  CO2 26  GLUCOSE 81  BUN 14  CREATININE 0.90  CALCIUM 8.9   Liver Function Tests:  Recent Labs Lab 07/26/13 1745  AST 29   ALT 15  ALKPHOS 74  BILITOT 0.6  PROT 6.5  ALBUMIN 2.7*   No results found for this basename: LIPASE, AMYLASE,  in the last 168 hours No results found for this basename: AMMONIA,  in the last 168 hours CBC:  Recent Labs Lab 07/26/13 1745  WBC 7.5  NEUTROABS 4.1  HGB 7.6*  HCT 24.8*  MCV 86.4  PLT 245   Cardiac Enzymes:  Recent Labs Lab 07/26/13 1745  TROPONINI <0.30   BNP: No components found with this basename: POCBNP,  CBG: No results found for this basename: GLUCAP,  in the last 168 hours  Radiological Exams on Admission: Dg Chest 2 View 07/26/2013   * IMPRESSION: Cardiomegaly with vascular congestion versus early edema   Original Report Authenticated By: Judie Petit. Miles Costain, M.D.    Code Status: Full Family Communication: Pt at bedside Disposition Plan: Admit for further evaluation  Manson Passey, MD  [facility] Pager [phone]  If 7PM-7AM, please contact night-coverage [email or web address] Password [email or web address] 07/26/2013, 9:15 PM

## 2013-07-27 DIAGNOSIS — D649 Anemia, unspecified: Secondary | ICD-10-CM

## 2013-07-27 DIAGNOSIS — E440 Moderate protein-calorie malnutrition: Secondary | ICD-10-CM

## 2013-07-27 DIAGNOSIS — I359 Nonrheumatic aortic valve disorder, unspecified: Secondary | ICD-10-CM

## 2013-07-27 LAB — CBC
MCH: 26.8 pg (ref 26.0–34.0)
MCHC: 30.8 g/dL (ref 30.0–36.0)
Platelets: 248 10*3/uL (ref 150–400)
RDW: 14.9 % (ref 11.5–15.5)

## 2013-07-27 LAB — BASIC METABOLIC PANEL
CO2: 26 mEq/L (ref 19–32)
Calcium: 8.8 mg/dL (ref 8.4–10.5)
Creatinine, Ser: 0.97 mg/dL (ref 0.50–1.10)
GFR calc Af Amer: 62 mL/min — ABNORMAL LOW (ref >90)

## 2013-07-27 LAB — TYPE AND SCREEN

## 2013-07-27 LAB — IRON AND TIBC
Iron: 32 ug/dL — ABNORMAL LOW (ref 42–135)
Saturation Ratios: 12 % — ABNORMAL LOW (ref 20–55)
TIBC: 274 ug/dL (ref 250–470)
UIBC: 242 ug/dL (ref 125–400)

## 2013-07-27 LAB — RETICULOCYTES: Retic Count, Absolute: 62.3 10*3/uL (ref 19.0–186.0)

## 2013-07-27 LAB — TSH: TSH: 1.007 u[IU]/mL (ref 0.350–4.500)

## 2013-07-27 LAB — FERRITIN: Ferritin: 19 ng/mL (ref 10–291)

## 2013-07-27 MED ORDER — CLONIDINE HCL 0.1 MG PO TABS
0.1000 mg | ORAL_TABLET | Freq: Every day | ORAL | Status: AC
  Administered 2013-07-27: 0.1 mg via ORAL
  Filled 2013-07-27: qty 1

## 2013-07-27 MED ORDER — POTASSIUM CHLORIDE CRYS ER 20 MEQ PO TBCR
40.0000 meq | EXTENDED_RELEASE_TABLET | Freq: Every day | ORAL | Status: DC
  Administered 2013-07-27 – 2013-07-29 (×3): 40 meq via ORAL
  Filled 2013-07-27 (×3): qty 2

## 2013-07-27 MED ORDER — ENSURE COMPLETE PO LIQD
237.0000 mL | Freq: Two times a day (BID) | ORAL | Status: DC
  Administered 2013-07-28 – 2013-07-29 (×3): 237 mL via ORAL

## 2013-07-27 MED ORDER — CLONIDINE HCL 0.1 MG PO TABS
0.1000 mg | ORAL_TABLET | Freq: Two times a day (BID) | ORAL | Status: AC
  Filled 2013-07-27: qty 1

## 2013-07-27 MED ORDER — POTASSIUM CHLORIDE CRYS ER 20 MEQ PO TBCR
40.0000 meq | EXTENDED_RELEASE_TABLET | Freq: Once | ORAL | Status: AC
  Administered 2013-07-27: 40 meq via ORAL
  Filled 2013-07-27: qty 2

## 2013-07-27 NOTE — Progress Notes (Signed)
INITIAL NUTRITION ASSESSMENT  DOCUMENTATION CODES Per approved criteria  -Non-severe (moderate) malnutrition in the context of chronic illness   INTERVENTION: Ensure Complete po BID, each supplement provides 350 kcal and 13 grams of protein. Assisted patient with meal ordering. Provide preferences within diet order Encourage intake Instructed on low sodium diet.  Teach back method used.  Written info provided with RD name and number.   NUTRITION DIAGNOSIS: Inadequate oral intake related to poor appetite as evidenced by patient report.   Goal: Intake of >/=90% of estimated needs with po meals and supplements.    Monitor:  Intake, labs, weight  Reason for Assessment: MST  79 y.o. female  Admitting Dx: Acute decompensated heart failure  ASSESSMENT: Patient admitted with CHF, UTI and normocytic anemia.  Patient reports regular diet with little added salt prior to admit.  Decreased intake of <50% of meals for greater than one month due to decreased appetite.  Was not taking any supplements.  UBW unknown.  Patient lives alone and states that she can prepare her own food but has not been hungry.  Patient meets criteria for moderate malnutrition related to chronic illness AEB intake <50% for>1 month and decreased muscle mass and body fat.    Nutrition Focused Physical Exam:  Subcutaneous Fat:  Orbital Region: moderate Upper Arm Region: moderate Thoracic and Lumbar Region: moderate  Muscle:  Temple Region: mild Clavicle Bone Region: wnl Clavicle and Acromion Bone Region: wnl Scapular Bone Region: mild Dorsal Hand: wnl Patellar Region: wnl Anterior Thigh Region: mod Posterior Calf Region: n/a  Edema: chf, no edema lower extremities.   Height: Ht Readings from Last 1 Encounters:  07/26/13 5\' 2"  (1.575 m)    Weight: Wt Readings from Last 1 Encounters:  07/27/13 144 lb 2.9 oz (65.4 kg)    Ideal Body Weight: 110 lbs  % Ideal Body Weight:  131  Wt Readings from  Last 10 Encounters:  07/27/13 144 lb 2.9 oz (65.4 kg)    Usual Body Weight: unknown  % Usual Body Weight: unknown  BMI:  Body mass index is 26.36 kg/(m^2).  Estimated Nutritional Needs: Kcal: 1400-1500 Protein: 60-70 gm Fluid: >1.4L  Skin: wnl  Diet Order: Cardiac  EDUCATION NEEDS: -Education needs addressed   Intake/Output Summary (Last 24 hours) at 07/27/13 1310 Last data filed at 07/27/13 1000  Gross per 24 hour  Intake  437.5 ml  Output   1550 ml  Net -1112.5 ml      Labs:   Recent Labs Lab 07/26/13 1745 07/26/13 2245 07/27/13 0424  NA 139 140 140  K 3.5 2.9* 3.2*  CL 104 105 105  CO2 26 26 26   BUN 14 14 14   CREATININE 0.90 0.92 0.97  CALCIUM 8.9 8.6 8.8  MG  --  1.7  --   GLUCOSE 81 90 91    CBG (last 3)  No results found for this basename: GLUCAP,  in the last 72 hours  Scheduled Meds: . cloNIDine  0.1 mg Oral BID  . furosemide  40 mg Intravenous Q12H  . [START ON 07/28/2013] levofloxacin (LEVAQUIN) IV  750 mg Intravenous Q48H  . lisinopril  5 mg Oral Daily  . metoprolol tartrate  12.5 mg Oral Q12H  . potassium chloride  40 mEq Oral Daily  . sodium chloride  3 mL Intravenous Q12H    Continuous Infusions:   Past Medical History  Diagnosis Date  . Cancer     breast    Past Surgical History  Procedure  Laterality Date  . Mastectomy      left    Oran Rein, RD, LDN Clinical Inpatient Dietitian Pager:  [phone] Weekend and after hours pager:  [phone]

## 2013-07-27 NOTE — Progress Notes (Addendum)
[facility] PROGRESS NOTE  Valerie Downs ZOX:[id number] DOB: 1930-09-22 DOA: 07/26/2013 PCP: User Centricity, MD  Assessment/Plan: 1. CHF exacerbation -continue IV lasix 40mg  Q12 -2D echo pending -strict I/Os -daily weights -replete K -continue metoprolol and ACE -FU TSH  2. Normocytic anemia -transfused 1 unit PRBC overnight -Hemoccult negative -check anemia panel  3. HTN:  -continue ACE/metoprolol and clonidine  4. H/o breast CA s/p mastectomy  5. UTi -on ceftriaxone, Fu cultures  DVT proph: SCDs  Code Status: FUll Family Communication: none at bedside Disposition Plan: Tx to tele   Consultants:    HPI/Subjective: Breathing ok, no specific complaints  Objective: Filed Vitals:   07/27/13 0600 07/27/13 0654 07/27/13 0700 07/27/13 0800  BP: 159/75  165/89 167/73  Pulse: 76 80 77 73  Temp:    98.5 F (36.9 C)  TempSrc:    Oral  Resp: 22 22 23 16   Height:      Weight:  65.4 kg (144 lb 2.9 oz)    SpO2: 96% 96% 99% 95%    Intake/Output Summary (Last 24 hours) at 07/27/13 1220 Last data filed at 07/27/13 1000  Gross per 24 hour  Intake  437.5 ml  Output   1550 ml  Net -1112.5 ml   Filed Weights   07/26/13 2200 07/27/13 0654  Weight: 66.6 kg (146 lb 13.2 oz) 65.4 kg (144 lb 2.9 oz)    Exam:   General:AAOx3, no distress  Cardiovascular: S1S2/RRR  Respiratory:basilar crackles  Abdomen: soft, NT, BS present  Musculoskeletal: no edema c/c   Data Reviewed: Basic Metabolic Panel:  Recent Labs Lab 07/26/13 1745 07/26/13 2245 07/27/13 0424  NA 139 140 140  K 3.5 2.9* 3.2*  CL 104 105 105  CO2 26 26 26   GLUCOSE 81 90 91  BUN 14 14 14   CREATININE 0.90 0.92 0.97  CALCIUM 8.9 8.6 8.8  MG  --  1.7  --    Liver Function Tests:  Recent Labs Lab 07/26/13 1745 07/26/13 2245  AST 29 15  ALT 15 11  ALKPHOS 74 77  BILITOT 0.6 0.6  PROT 6.5 6.4  ALBUMIN 2.7* 2.6*   No results found for this basename: LIPASE, AMYLASE,  in the last  168 hours No results found for this basename: AMMONIA,  in the last 168 hours CBC:  Recent Labs Lab 07/26/13 1745 07/26/13 2245 07/27/13 0910  WBC 7.5 6.4 6.6  NEUTROABS 4.1 3.9  --   HGB 7.6* 7.7* 8.8*  HCT 24.8* 24.9* 28.6*  MCV 86.4 86.8 87.2  PLT 245 237 248   Cardiac Enzymes:  Recent Labs Lab 07/26/13 1745 07/26/13 2245 07/27/13 0424  TROPONINI <0.30 <0.30 <0.30   BNP (last 3 results)  Recent Labs  07/26/13 1745 07/26/13 2245  PROBNP 13354.0* 13350.0*   CBG: No results found for this basename: GLUCAP,  in the last 168 hours  Recent Results (from the past 240 hour(s))  MRSA PCR SCREENING     Status: None   Collection Time    07/26/13 10:17 PM      Result Value Range Status   MRSA by PCR NEGATIVE  NEGATIVE Final   Comment:            The GeneXpert MRSA Assay (FDA     approved for NASAL specimens     only), is one component of a     comprehensive MRSA colonization     surveillance program. It is not     intended to diagnose  MRSA     infection nor to guide or     monitor treatment for     MRSA infections.     Studies: Dg Chest 2 View  07/26/2013   *RADIOLOGY REPORT*  Clinical Data: Nausea, dizziness, prior breast cancer  CHEST - 2 VIEW  Comparison: 06/17/2011  Findings: Heart is enlarged with vascular congestion and interstitial prominence versus early developing edema.  No large effusion or pneumothorax.  No collapse consolidation.  No definite pneumonia.  Postop changes from left mastectomy and axillary lymph node dissection.  IMPRESSION: Cardiomegaly with vascular congestion versus early edema   Original Report Authenticated By: Judie Petit. Shick, M.D.    Scheduled Meds: . furosemide  40 mg Intravenous Q12H  . [START ON 07/28/2013] levofloxacin (LEVAQUIN) IV  750 mg Intravenous Q48H  . lisinopril  5 mg Oral Daily  . metoprolol tartrate  12.5 mg Oral Q12H  . potassium chloride  40 mEq Oral Daily  . sodium chloride  3 mL Intravenous Q12H   Continuous  Infusions:   Principal Problem:   Acute decompensated heart failure Active Problems:   HYPERTENSION, BENIGN SYSTEMIC   Acute blood loss anemia   UTI (lower urinary tract infection)    Time spent:    [facility]  [facility] Pager [phone]. If 7PM-7AM, please contact night-coverage at [email or web address], password [facility] 07/27/2013, 12:20 PM  LOS: 1 day

## 2013-07-27 NOTE — Progress Notes (Signed)
Pt and son watched CHF video #102.

## 2013-07-27 NOTE — Progress Notes (Signed)
Echo Lab  2D Echocardiogram completed.  Dennette Faulconer L Onika Gudiel, RDCS 07/27/2013 2:09 PM

## 2013-07-27 NOTE — Progress Notes (Signed)
  [id number]/Joshuah Minella, RN, BSN, CCN: [phone]/ Case management. Chart reviewed for discharge planning and present needs. Discharge needs: none present at time of review. Next chart review due:  [id number] 

## 2013-07-28 DIAGNOSIS — E440 Moderate protein-calorie malnutrition: Secondary | ICD-10-CM | POA: Diagnosis present

## 2013-07-28 LAB — CBC
HCT: 27.8 % — ABNORMAL LOW (ref 36.0–46.0)
Hemoglobin: 8.5 g/dL — ABNORMAL LOW (ref 12.0–15.0)
MCHC: 30.6 g/dL (ref 30.0–36.0)
MCV: 87.7 fL (ref 78.0–100.0)
RDW: 15 % (ref 11.5–15.5)

## 2013-07-28 LAB — BASIC METABOLIC PANEL
BUN: 20 mg/dL (ref 6–23)
CO2: 26 mEq/L (ref 19–32)
Chloride: 104 mEq/L (ref 96–112)
Creatinine, Ser: 1.27 mg/dL — ABNORMAL HIGH (ref 0.50–1.10)
Glucose, Bld: 105 mg/dL — ABNORMAL HIGH (ref 70–99)
Potassium: 3.9 mEq/L (ref 3.5–5.1)

## 2013-07-28 LAB — URINE CULTURE: Culture: NO GROWTH

## 2013-07-28 MED ORDER — LIVING BETTER WITH HEART FAILURE BOOK
Freq: Once | Status: AC
  Administered 2013-07-28: 10:00:00
  Filled 2013-07-28: qty 1

## 2013-07-28 MED ORDER — FUROSEMIDE 40 MG PO TABS
40.0000 mg | ORAL_TABLET | Freq: Two times a day (BID) | ORAL | Status: DC
  Administered 2013-07-28 – 2013-07-29 (×2): 40 mg via ORAL
  Filled 2013-07-28 (×4): qty 1

## 2013-07-28 MED ORDER — LEVOFLOXACIN 250 MG PO TABS
250.0000 mg | ORAL_TABLET | Freq: Every day | ORAL | Status: DC
  Administered 2013-07-28: 250 mg via ORAL
  Filled 2013-07-28: qty 1

## 2013-07-28 MED ORDER — FERROUS SULFATE 325 (65 FE) MG PO TABS
325.0000 mg | ORAL_TABLET | Freq: Two times a day (BID) | ORAL | Status: DC
  Administered 2013-07-28 – 2013-07-29 (×2): 325 mg via ORAL
  Filled 2013-07-28 (×4): qty 1

## 2013-07-28 MED ORDER — POLYETHYLENE GLYCOL 3350 17 G PO PACK
17.0000 g | PACK | Freq: Two times a day (BID) | ORAL | Status: DC | PRN
  Filled 2013-07-28: qty 1

## 2013-07-28 NOTE — Progress Notes (Addendum)
[facility] PROGRESS NOTE  Valerie Downs ZOX:[id number] DOB: [birth year] DOA: 07/26/2013 PCP: User Centricity, MD  Assessment/Plan: 1. CHF exacerbation -change IV lasix  To PO 40mg  Q12 -2D echo with EF of 45% diffuse hypokinesis and severe LVH suggestive of hypertensive cardiomyopathy -strict I/Os -daily weights -replete K -continue metoprolol and ACE -FU TSH -FU with [place] cardiology  2. Normocytic anemia -transfused 1 unit PRBC 7/31 -Hemoccult negative -Anemia panel c/w Iron deficiency -start PO Fe, h/o colonoscopy 12 years ago, needs outpatient eval  3. HTN:  -continue ACE/metoprolol and clonidine  4. H/o breast CA s/p mastectomy  5. UTi -Dc abx, cultures negative  DVT proph: SCDs  Code Status: FUll Family Communication: none at bedside Disposition Plan: Tx to tele   Consultants:    HPI/Subjective: Breathing ok, no specific complaints  Objective: Filed Vitals:   07/27/13 1713 07/27/13 2207 07/28/13 0432 07/28/13 1318  BP: 116/62 136/64 122/57 125/59  Pulse: 67 74 64 71  Temp: 98.3 F (36.8 C) 98.2 F (36.8 C) 98.4 F (36.9 C) 98.2 F (36.8 C)  TempSrc: Oral Oral Oral Oral  Resp: 15 18 18 18   Height: 5\' 2"  (1.575 m)     Weight: 66.679 kg (147 lb)     SpO2: 100% 100% 98% 100%    Intake/Output Summary (Last 24 hours) at 07/28/13 1742 Last data filed at 07/28/13 1400  Gross per 24 hour  Intake    720 ml  Output   2150 ml  Net  -1430 ml   Filed Weights   07/26/13 2200 07/27/13 0654 07/27/13 1713  Weight: 66.6 kg (146 lb 13.2 oz) 65.4 kg (144 lb 2.9 oz) 66.679 kg (147 lb)    Exam:   General:AAOx3, no distress  Cardiovascular: S1S2/RRR  Respiratory:basilar crackles  Abdomen: soft, NT, BS present  Musculoskeletal: no edema c/c   Data Reviewed: Basic Metabolic Panel:  Recent Labs Lab 07/26/13 1745 07/26/13 2245 07/27/13 0424 07/28/13 0600  NA 139 140 140 137  K 3.5 2.9* 3.2* 3.9  CL 104 105 105 104  CO2 26 26 26 26    GLUCOSE 81 90 91 105*  BUN 14 14 14 20   CREATININE 0.90 0.92 0.97 1.27*  CALCIUM 8.9 8.6 8.8 9.3  MG  --  1.7  --   --    Liver Function Tests:  Recent Labs Lab 07/26/13 1745 07/26/13 2245  AST 29 15  ALT 15 11  ALKPHOS 74 77  BILITOT 0.6 0.6  PROT 6.5 6.4  ALBUMIN 2.7* 2.6*   No results found for this basename: LIPASE, AMYLASE,  in the last 168 hours No results found for this basename: AMMONIA,  in the last 168 hours CBC:  Recent Labs Lab 07/26/13 1745 07/26/13 2245 07/27/13 0910 07/28/13 0600  WBC 7.5 6.4 6.6 6.7  NEUTROABS 4.1 3.9  --   --   HGB 7.6* 7.7* 8.8* 8.5*  HCT 24.8* 24.9* 28.6* 27.8*  MCV 86.4 86.8 87.2 87.7  PLT 245 237 248 216   Cardiac Enzymes:  Recent Labs Lab 07/26/13 1745 07/26/13 2245 07/27/13 0424  TROPONINI <0.30 <0.30 <0.30   BNP (last 3 results)  Recent Labs  07/26/13 1745 07/26/13 2245  PROBNP 13354.0* 13350.0*   CBG: No results found for this basename: GLUCAP,  in the last 168 hours  Recent Results (from the past 240 hour(s))  URINE CULTURE     Status: None   Collection Time    07/26/13  5:47 PM  Result Value Range Status   Specimen Description URINE, CLEAN CATCH   Final   Special Requests NONE   Final   Culture  Setup Time 07/27/2013 07:18   Final   Colony Count NO GROWTH   Final   Culture NO GROWTH   Final   Report Status 07/28/2013 FINAL   Final  MRSA PCR SCREENING     Status: None   Collection Time    07/26/13 10:17 PM      Result Value Range Status   MRSA by PCR NEGATIVE  NEGATIVE Final   Comment:            The GeneXpert MRSA Assay (FDA     approved for NASAL specimens     only), is one component of a     comprehensive MRSA colonization     surveillance program. It is not     intended to diagnose MRSA     infection nor to guide or     monitor treatment for     MRSA infections.     Studies: Dg Chest 2 View  07/26/2013   *RADIOLOGY REPORT*  Clinical Data: Nausea, dizziness, prior breast cancer   CHEST - 2 VIEW  Comparison: 06/17/2011  Findings: Heart is enlarged with vascular congestion and interstitial prominence versus early developing edema.  No large effusion or pneumothorax.  No collapse consolidation.  No definite pneumonia.  Postop changes from left mastectomy and axillary lymph node dissection.  IMPRESSION: Cardiomegaly with vascular congestion versus early edema   Original Report Authenticated By: Judie Petit. Shick, M.D.    Scheduled Meds: . feeding supplement  237 mL Oral BID BM  . ferrous sulfate  325 mg Oral BID WC  . furosemide  40 mg Oral BID  . levofloxacin  250 mg Oral Daily  . metoprolol tartrate  12.5 mg Oral Q12H  . potassium chloride  40 mEq Oral Daily  . sodium chloride  3 mL Intravenous Q12H   Continuous Infusions:   Principal Problem:   Acute decompensated heart failure Active Problems:   HYPERTENSION, BENIGN SYSTEMIC   Acute blood loss anemia   UTI (lower urinary tract infection)   Malnutrition of moderate degree    Time spent:    [facility]  [facility] Pager [phone]. If 7PM-7AM, please contact night-coverage at [email or web address], password [facility] 07/28/2013, 5:42 PM  LOS: 2 days

## 2013-07-28 NOTE — Progress Notes (Addendum)
1500-Report received from N. Broadnax,RN; no change in assessment. Valerie Downs 

## 2013-07-29 DIAGNOSIS — N390 Urinary tract infection, site not specified: Secondary | ICD-10-CM

## 2013-07-29 DIAGNOSIS — E440 Moderate protein-calorie malnutrition: Secondary | ICD-10-CM

## 2013-07-29 DIAGNOSIS — I5021 Acute systolic (congestive) heart failure: Secondary | ICD-10-CM

## 2013-07-29 DIAGNOSIS — I509 Heart failure, unspecified: Secondary | ICD-10-CM

## 2013-07-29 LAB — CBC
HCT: 28.9 % — ABNORMAL LOW (ref 36.0–46.0)
Hemoglobin: 8.8 g/dL — ABNORMAL LOW (ref 12.0–15.0)
MCH: 26.7 pg (ref 26.0–34.0)
MCHC: 30.4 g/dL (ref 30.0–36.0)
RDW: 14.9 % (ref 11.5–15.5)

## 2013-07-29 LAB — BASIC METABOLIC PANEL
BUN: 20 mg/dL (ref 6–23)
Creatinine, Ser: 1.15 mg/dL — ABNORMAL HIGH (ref 0.50–1.10)
GFR calc Af Amer: 50 mL/min — ABNORMAL LOW (ref >90)
GFR calc non Af Amer: 43 mL/min — ABNORMAL LOW (ref >90)
Glucose, Bld: 91 mg/dL (ref 70–99)

## 2013-07-29 MED ORDER — METOPROLOL TARTRATE 12.5 MG HALF TABLET: 12.5000 mg | ORAL_TABLET | Freq: Two times a day (BID) | ORAL | Status: DC

## 2013-07-29 MED ORDER — FUROSEMIDE 40 MG PO TABS: 40.0000 mg | ORAL_TABLET | Freq: Every day | ORAL | Status: DC

## 2013-07-29 MED ORDER — POTASSIUM CHLORIDE CRYS ER 20 MEQ PO TBCR: 20.0000 meq | EXTENDED_RELEASE_TABLET | Freq: Every day | ORAL | Status: DC

## 2013-07-29 MED ORDER — FERROUS SULFATE 325 (65 FE) MG PO TABS: 325.0000 mg | ORAL_TABLET | Freq: Two times a day (BID) | ORAL | Status: DC

## 2013-07-29 NOTE — Discharge Summary (Signed)
Physician Discharge Summary  Valerie Downs MWU:[id number] DOB: 02-06-1930 DOA: 07/26/2013  PCP: User Centricity, MD  Admit date: 07/26/2013 Discharge date: 07/29/2013  Time spent: 35 minutes  Recommendations for Outpatient Follow-up:  1. [place] cards 8/7  Discharge Diagnoses:  Principal Problem:   Acute decompensated heart failure Active Problems:   HYPERTENSION, BENIGN SYSTEMIC   Malnutrition of moderate degree   Acute systolic CHF (congestive heart failure)  Moderate MR  Moderate AI  Discharge Condition: stable  Diet recommendation: low sodium  Filed Weights   07/27/13 0654 07/27/13 1713 07/29/13 0509  Weight: 65.4 kg (144 lb 2.9 oz) 66.679 kg (147 lb) 63.821 kg (140 lb 11.2 oz)    History of present illness:  79 year old female with past medical history of breast cancer status post left mastectomy who presented to [facility] ED for evaluation of an ongoing progressive weakness, fatigue and shortness of breath for past 1 month prior to this admission. Patient reports experiencing shortness of breath on and off mostly pronounced with activity but present at time even at rest. Patient reported no associated fevers or chills or productive cough. Patient did endorse few episodes of intermittent substernal chest pain, about 4-5/10 in intensity lasting few minutes at the time and spontaneously resolving. She had associated nausea but no vomiting. No reports of blood in stool or urine. No diarrhea or constipation. No loss of consciousness.  In ED, BP was 174/69 with HR of 86, Tmax 99.2 F and O2 saturation of 93% on room air. CBC revealed hemoglobin of 7.6 and FOBT was negative. Urinalysis was significant for many bacteria. Additionally, the patient was found to have BNP of 13,354 and bedside ECHO preliminary report showed reduced EF. CXR showed cardiomegaly with vascular congestion. Patient was given 1 dose of lasix 40 mg IV in ED and cardiology was consulted by ED physician.   Hospital Course:   1. CHF exacerbation -Diuresed with IV lasix, then changed to PO 40mg  Q12  -clinically improved -cardiac enzymes negative x3, no events on Tele -2D echo with EF of 45% diffuse hypokinesis and severe LVH suggestive of hypertensive cardiomyopathy  -KCL supplementation -continue metoprolol and ACE  -FU with [place] cardiology appt made for 8/7 to determine need for ischemic eval  2. Normocytic anemia  -transfused 1 unit PRBC 7/31  -Hemoccult negative  -Anemia panel c/w Iron deficiency  -start PO Fe, h/o colonoscopy 12 years ago, needs outpatient eval   3. HTN:  -continue ACE/metoprolol and clonidine   4. H/o breast CA s/p mastectomy   5. UTi  -stopped abx, cultures negative   Procedures:  2D ECHO   Discharge Exam: Filed Vitals:   07/28/13 0432 07/28/13 1318 07/28/13 2114 07/29/13 0509  BP: 122/57 125/59 155/63 166/68  Pulse: 64 71 79 81  Temp: 98.4 F (36.9 C) 98.2 F (36.8 C) 97.2 F (36.2 C) 98.1 F (36.7 C)  TempSrc: Oral Oral Oral Oral  Resp: 18 18 20 18   Height:      Weight:    63.821 kg (140 lb 11.2 oz)  SpO2: 98% 100% 96% 96%    General: AAOx3 Cardiovascular:S1S2/RRR Respiratory: CTAB  Discharge Instructions  Discharge Orders   Future Orders Complete By Expires     Diet - low sodium heart healthy  As directed     Increase activity slowly  As directed         Medication List         ferrous sulfate 325 (65 FE) MG tablet  Take 1 tablet (325 mg total) by mouth 2 (two) times daily with a meal.     furosemide 40 MG tablet  Commonly known as:  LASIX  Take 1 tablet (40 mg total) by mouth daily.     metoprolol tartrate 12.5 mg Tabs  Commonly known as:  LOPRESSOR  Take 0.5 tablets (12.5 mg total) by mouth every 12 (twelve) hours.     potassium chloride SA 20 MEQ tablet  Commonly known as:  K-DUR,KLOR-CON  Take 1 tablet (20 mEq total) by mouth daily.       Allergies  Allergen Reactions  . Penicillins Swelling      The results of  significant diagnostics from this hospitalization (including imaging, microbiology, ancillary and laboratory) are listed below for reference.    Significant Diagnostic Studies: Dg Chest 2 View  07/26/2013   *RADIOLOGY REPORT*  Clinical Data: Nausea, dizziness, prior breast cancer  CHEST - 2 VIEW  Comparison: 06/17/2011  Findings: Heart is enlarged with vascular congestion and interstitial prominence versus early developing edema.  No large effusion or pneumothorax.  No collapse consolidation.  No definite pneumonia.  Postop changes from left mastectomy and axillary lymph node dissection.  IMPRESSION: Cardiomegaly with vascular congestion versus early edema   Original Report Authenticated By: Judie Petit. Miles Costain, M.D.    Microbiology: Recent Results (from the past 240 hour(s))  URINE CULTURE     Status: None   Collection Time    07/26/13  5:47 PM      Result Value Range Status   Specimen Description URINE, CLEAN CATCH   Final   Special Requests NONE   Final   Culture  Setup Time 07/27/2013 07:18   Final   Colony Count NO GROWTH   Final   Culture NO GROWTH   Final   Report Status 07/28/2013 FINAL   Final  MRSA PCR SCREENING     Status: None   Collection Time    07/26/13 10:17 PM      Result Value Range Status   MRSA by PCR NEGATIVE  NEGATIVE Final   Comment:            The GeneXpert MRSA Assay (FDA     approved for NASAL specimens     only), is one component of a     comprehensive MRSA colonization     surveillance program. It is not     intended to diagnose MRSA     infection nor to guide or     monitor treatment for     MRSA infections.     Labs: Basic Metabolic Panel:  Recent Labs Lab 07/26/13 1745 07/26/13 2245 07/27/13 0424 07/28/13 0600 07/29/13 0456  NA 139 140 140 137 136  K 3.5 2.9* 3.2* 3.9 3.7  CL 104 105 105 104 100  CO2 26 26 26 26 31   GLUCOSE 81 90 91 105* 91  BUN 14 14 14 20 20   CREATININE 0.90 0.92 0.97 1.27* 1.15*  CALCIUM 8.9 8.6 8.8 9.3 9.3  MG  --  1.7  --    --   --    Liver Function Tests:  Recent Labs Lab 07/26/13 1745 07/26/13 2245  AST 29 15  ALT 15 11  ALKPHOS 74 77  BILITOT 0.6 0.6  PROT 6.5 6.4  ALBUMIN 2.7* 2.6*   No results found for this basename: LIPASE, AMYLASE,  in the last 168 hours No results found for this basename: AMMONIA,  in the last 168 hours CBC:  Recent Labs Lab 07/26/13 1745 07/26/13 2245 07/27/13 0910 07/28/13 0600 07/29/13 0456  WBC 7.5 6.4 6.6 6.7 6.9  NEUTROABS 4.1 3.9  --   --   --   HGB 7.6* 7.7* 8.8* 8.5* 8.8*  HCT 24.8* 24.9* 28.6* 27.8* 28.9*  MCV 86.4 86.8 87.2 87.7 87.6  PLT 245 237 248 216 264   Cardiac Enzymes:  Recent Labs Lab 07/26/13 1745 07/26/13 2245 07/27/13 0424  TROPONINI <0.30 <0.30 <0.30   BNP: BNP (last 3 results)  Recent Labs  07/26/13 1745 07/26/13 2245  PROBNP 13354.0* 13350.0*   CBG: No results found for this basename: GLUCAP,  in the last 168 hours     Signed:  Mattalyn Anderegg  [facility] 07/29/2013, 11:58 AM

## 2013-07-29 NOTE — Progress Notes (Signed)
Pt left facility at 1410 this day with her daughter at her side.  Pt without c/o; alert and oriented. Discharge instructions given/explained with pt and daughter verbalizing understanding. Followup appointments noted. Prescription pickup noted at [facility].

## 2013-07-29 NOTE — Care Management Note (Signed)
    Page 1 of 2   07/29/2013     2:15:29 PM   CARE MANAGEMENT NOTE 07/29/2013  Patient:  RICHEL, MILLSPAUGH   Account Number:  [id number]  Date Initiated:  07/27/2013  Documentation initiated by:  DAVIS,RHONDA  Subjective/Objective Assessment:   pt with hx of chf now with increased wob,and dyspnea ,cxr positive for chf and cardiomegaly.     Action/Plan:   pt lives at home with children   Anticipated DC Date:  07/29/2013   Anticipated DC Plan:  HOME/SELF CARE  In-house referral  NA      DC Planning Services  CM consult      PAC Choice  NA   Choice offered to / List presented to:  NA   DME arranged  NA      DME agency  NA     HH arranged  NA      HH agency  NA   Status of service:  Completed, signed off Medicare Important Message given?  NA - LOS <3 / Initial given by admissions (If response is "NO", the following Medicare IM given date fields will be blank) Date Medicare IM given:   Date Additional Medicare IM given:    Discharge Disposition:  HOME/SELF CARE  Per UR Regulation:  Reviewed for med. necessity/level of care/duration of stay  If discussed at Long Length of Stay Meetings, dates discussed:    Comments:  07/29/13 Lanier Clam RN,BSN NCM WEEKEND [phone] D/C HOME NO ORDERS OR NEEDS.  [id number]/QIONGE Earlene Plater, RN, BSN, CCN: [phone] Case management. Chart reviewed for discharge planning and present needs. Discharge needs: none present at time of review. Next chart review due:  [id number]

## 2013-08-02 ENCOUNTER — Ambulatory Visit (INDEPENDENT_AMBULATORY_CARE_PROVIDER_SITE_OTHER): Payer: Medicare Other | Admitting: Cardiovascular Disease

## 2013-08-02 ENCOUNTER — Encounter: Payer: Self-pay | Admitting: *Deleted

## 2013-08-02 VITALS — BP 130/62 | HR 82 | Ht 64.0 in | Wt 132.0 lb

## 2013-08-02 DIAGNOSIS — I422 Other hypertrophic cardiomyopathy: Secondary | ICD-10-CM

## 2013-08-02 DIAGNOSIS — I10 Essential (primary) hypertension: Secondary | ICD-10-CM

## 2013-08-02 DIAGNOSIS — I5022 Chronic systolic (congestive) heart failure: Secondary | ICD-10-CM

## 2013-08-02 DIAGNOSIS — I509 Heart failure, unspecified: Secondary | ICD-10-CM

## 2013-08-02 LAB — BASIC METABOLIC PANEL
BUN: 23 mg/dL (ref 6–23)
Calcium: 9.6 mg/dL (ref 8.4–10.5)
GFR: 51 mL/min — ABNORMAL LOW (ref >60.00)
Glucose, Bld: 95 mg/dL (ref 70–99)
Potassium: 4.3 mEq/L (ref 3.5–5.1)
Sodium: 139 mEq/L (ref 135–145)

## 2013-08-02 MED ORDER — LISINOPRIL 5 MG PO TABS: 5.0000 mg | ORAL_TABLET | Freq: Every day | ORAL | Status: DC

## 2013-08-02 NOTE — Progress Notes (Signed)
History of Present Illness: 79 yo female with history with history of breast cancer and new diagnosis of systolic LV dysfunction who is here today to establish cardiology care. She was admitted to [facility] 07/26/13 with c/o SOB. She was found to have volume overload with BNP of 13,000. Chest x-ray with mild vascular congestion. Echo 07/27/13 with severe LVH, LVEF 40-45%, moderate AI, moderate MR, moderate TR. She was diuresed and discharged home 07/29/13.   She tells me today that she feels great. No chest pain or SOB. NO LE edema. Tolerating all meds.   Primary Care Physician:  Jeri Cos  Past Medical History  Diagnosis Date  . Breast cancer   . Cardiomyopathy   . Aortic insufficiency   . Mitral regurgitation     Past Surgical History  Procedure Laterality Date  . Mastectomy      left    Current Outpatient Prescriptions  Medication Sig Dispense Refill  . ferrous sulfate 325 (65 FE) MG tablet Take 1 tablet (325 mg total) by mouth 2 (two) times daily with a meal.  30 tablet  1  . furosemide (LASIX) 40 MG tablet Take 1 tablet (40 mg total) by mouth daily.  30 tablet  0  . metoprolol tartrate (LOPRESSOR) 12.5 mg TABS tablet Take 6.25 mg by mouth every 12 (twelve) hours.      . potassium chloride SA (K-DUR,KLOR-CON) 20 MEQ tablet Take 1 tablet (20 mEq total) by mouth daily.  30 tablet  0   No current facility-administered medications for this visit.    Allergies  Allergen Reactions  . Penicillins Swelling    History   Social History  . Marital Status: Widowed    Spouse Name: N/A    Number of Children: N/A  . Years of Education: N/A   Occupational History  . Not on file.   Social History Main Topics  . Smoking status: Former Smoker -- 50 years    Types: Cigarettes    Quit date: 06/25/2012  . Smokeless tobacco: Current User    Types: Snuff  . Alcohol Use: No  . Drug Use: No  . Sexually Active: Not on file   Other Topics Concern  . Not on file    Social History Narrative  . No narrative on file    Family History  Problem Relation Age of Onset  . Hypertension Mother     Review of Systems:  As stated in the HPI and otherwise negative.   BP 130/62  Pulse 82  Ht 5\' 4"  (1.626 m)  Wt 132 lb (59.875 kg)  BMI 22.65 kg/m2  SpO2 98%  Physical Examination: General: Well developed, well nourished, NAD HEENT: OP clear, mucus membranes moist SKIN: warm, dry. No rashes. Neuro: No focal deficits Musculoskeletal: Muscle strength 5/5 all ext Psychiatric: Mood and affect normal Neck: No JVD, no carotid bruits, no thyromegaly, no lymphadenopathy. Lungs:Clear bilaterally, no wheezes, rhonci, crackles Cardiovascular: Regular rate and rhythm. Systolic murmur. No gallops or rubs. Abdomen:Soft. Bowel sounds present. Non-tender.  Extremities: No lower extremity edema. Pulses are 2 + in the bilateral DP/PT.  EKG 07/28/13: NSR, LVH  Echo 07/27/13: Left ventricle: The cavity size was normal. Wall thickness was increased in a pattern of severe LVH. There was mild focal basal hypertrophy of the septum. Systolic function was mildly to moderately reduced. The estimated ejection fraction was in the range of 40% to 45%. Diffuse hypokinesis. Doppler parameters are consistent with high ventricular filling pressure. - Aortic valve:  Moderate regurgitation. - Mitral valve: Moderate regurgitation. - Left atrium: The atrium was moderately dilated. - Right atrium: The atrium was severely dilated. - Tricuspid valve: Moderate regurgitation. - Pulmonic valve: Moderate regurgitation. - Pulmonary arteries: PA peak pressure: 48mm Hg (S). - Pericardium, extracardiac: A trivial pericardial effusion was identified posterior to the heart.  Assessment and Plan:   1. Cardiomyopathy: Unknown etiology. She has no known obstructive CAD. Likely due to hypertension with severe LVH on echo. Will not pursue ischemic evaluation at this time. Will continue beta  blocker. Will add Lisinopril 5 mg po Qdaily. BMET today and in 10 days.   2. Chronic systolic CHF: Volume status ok. Will continue Lasix 40 mg po Qdaily with KDur.   3. Mitral regurgitation: Moderate by echo July 2014. Repeat echo one year  4. Aortic insufficiency: Moderate by echo July 2014. Repeat echo one year.   5. HTN: BP controlled.

## 2013-08-02 NOTE — Patient Instructions (Signed)
Your physician recommends that you schedule a follow-up appointment in: 3 months with Dr. Clifton James  Your physician recommends that you return for lab work in:  2 weeks--August 16, 2013. You do not have to be fasting. The lab opens at 7:30 AM.  Your physician has recommended you make the following change in your medication:  Start Lisinopril 5 mg by mouth daily

## 2013-08-16 ENCOUNTER — Other Ambulatory Visit (INDEPENDENT_AMBULATORY_CARE_PROVIDER_SITE_OTHER): Payer: Medicare Other

## 2013-08-16 DIAGNOSIS — I509 Heart failure, unspecified: Secondary | ICD-10-CM

## 2013-08-16 DIAGNOSIS — I5022 Chronic systolic (congestive) heart failure: Secondary | ICD-10-CM

## 2013-08-16 DIAGNOSIS — I10 Essential (primary) hypertension: Secondary | ICD-10-CM

## 2013-08-16 LAB — BASIC METABOLIC PANEL
CO2: 28 mEq/L (ref 19–32)
Calcium: 10.1 mg/dL (ref 8.4–10.5)
Creatinine, Ser: 1.2 mg/dL (ref 0.4–1.2)
Sodium: 136 mEq/L (ref 135–145)

## 2013-11-07 ENCOUNTER — Encounter: Payer: Self-pay | Admitting: Cardiovascular Disease

## 2013-11-07 ENCOUNTER — Ambulatory Visit (INDEPENDENT_AMBULATORY_CARE_PROVIDER_SITE_OTHER): Payer: Medicare Other | Admitting: Cardiovascular Disease

## 2013-11-07 VITALS — BP 142/74 | HR 81 | Ht 60.0 in | Wt 144.4 lb

## 2013-11-07 DIAGNOSIS — I509 Heart failure, unspecified: Secondary | ICD-10-CM

## 2013-11-07 MED ORDER — FUROSEMIDE 40 MG PO TABS: ORAL_TABLET | ORAL | Status: DC

## 2013-11-07 NOTE — Patient Instructions (Signed)
Your physician recommends that you schedule a follow-up appointment in: 3 months.   Your physician has recommended you make the following change in your medication: Increase furosemide to 40 mg by mouth twice daily on Monday, Wednesday and Friday.  Take one tablet by mouth daily on the other days of the week.

## 2013-11-07 NOTE — Progress Notes (Signed)
History of Present Illness: 79 yo female with history with history of breast cancer, systolic LV dysfunction due to presumed non-ischemic cardiomyopathy who is here today for cardiac follow up. She was admitted to [facility] 07/26/13 with c/o SOB. She was found to have volume overload with BNP of 13,000. Chest x-ray with mild vascular congestion. Echo 07/27/13 with severe LVH, LVEF 40-45%, moderate AI, moderate MR, moderate TR. She was diuresed and discharged home 07/29/13. I last saw her in the office 08/02/13.   She is here today for follow up. She tells me today that she feels great. No chest pain. Occasional SOB with exertion. No LE edema. Tolerating all meds. She has gained 12 lbs in last 3 months. She states she is trying to gain weight.   Primary Care Physician:  Jeri Cos  Past Medical History  Diagnosis Date  . Breast cancer   . Cardiomyopathy   . Aortic insufficiency   . Mitral regurgitation     Past Surgical History  Procedure Laterality Date  . Mastectomy      left    Current Outpatient Prescriptions  Medication Sig Dispense Refill  . furosemide (LASIX) 40 MG tablet Take 1 tablet (40 mg total) by mouth daily.  30 tablet  0  . lisinopril (PRINIVIL,ZESTRIL) 5 MG tablet Take 1 tablet (5 mg total) by mouth daily.  30 tablet  11  . metoprolol tartrate (LOPRESSOR) 25 MG tablet Take 12.5 mg by mouth 2 (two) times daily.      . potassium chloride SA (K-DUR,KLOR-CON) 20 MEQ tablet Take 1 tablet (20 mEq total) by mouth daily.  30 tablet  0   No current facility-administered medications for this visit.    Allergies  Allergen Reactions  . Penicillins Swelling    History   Social History  . Marital Status: Widowed    Spouse Name: N/A    Number of Children: N/A  . Years of Education: N/A   Occupational History  . Not on file.   Social History Main Topics  . Smoking status: Former Smoker -- 50 years    Types: Cigarettes    Quit date: 06/25/2012  .  Smokeless tobacco: Current User    Types: Snuff  . Alcohol Use: No  . Drug Use: No  . Sexual Activity: Not on file   Other Topics Concern  . Not on file   Social History Narrative  . No narrative on file    Family History  Problem Relation Age of Onset  . Hypertension Mother     Review of Systems:  As stated in the HPI and otherwise negative.   BP 142/74  Pulse 81  Ht 5' (1.524 m)  Wt 144 lb 6.4 oz (65.499 kg)  BMI 28.20 kg/m2  Physical Examination: General: Well developed, well nourished, NAD HEENT: OP clear, mucus membranes moist SKIN: warm, dry. No rashes. Neuro: No focal deficits Musculoskeletal: Muscle strength 5/5 all ext Psychiatric: Mood and affect normal Neck: No JVD, no carotid bruits, no thyromegaly, no lymphadenopathy. Lungs:Clear bilaterally, no wheezes, rhonci, crackles Cardiovascular: Regular rate and rhythm. Systolic murmur. No gallops or rubs. Abdomen:Soft. Bowel sounds present. Non-tender.  Extremities: No lower extremity edema. Pulses are 2 + in the bilateral DP/PT.  Echo 07/27/13: Left ventricle: The cavity size was normal. Wall thickness was increased in a pattern of severe LVH. There was mild focal basal hypertrophy of the septum. Systolic function was mildly to moderately reduced. The estimated ejection fraction was in  the range of 40% to 45%. Diffuse hypokinesis. Doppler parameters are consistent with high ventricular filling pressure. - Aortic valve: Moderate regurgitation. - Mitral valve: Moderate regurgitation. - Left atrium: The atrium was moderately dilated. - Right atrium: The atrium was severely dilated. - Tricuspid valve: Moderate regurgitation. - Pulmonic valve: Moderate regurgitation. - Pulmonary arteries: PA peak pressure: 48mm Hg (S). - Pericardium, extracardiac: A trivial pericardial effusion was identified posterior to the heart.  Assessment and Plan:   1. Non-ischemic Cardiomyopathy: Unknown etiology. Felt to be  non-ischemic. She has no known obstructive CAD. Likely due to hypertension with severe LVH on echo. Will not pursue ischemic evaluation at this time. Will continue beta blocker and Ace-inh.    2. Chronic systolic CHF: She has occasional dyspnea. Weight is up 12 lbs in 3 months. She states that she is trying to gain weight. Some JVD. No LE edema. Will continue Lasix 40 mg po daily with 40 mg po BID on Monday, Wednesday and Friday. Continue KDur.   3. Mitral regurgitation: Moderate by echo July 2014. Repeat echo August 2015.  4. Aortic insufficiency: Moderate by echo July 2014. Repeat echo August 2015.  5. HTN: BP controlled. No changes today.

## 2014-02-07 ENCOUNTER — Encounter: Payer: Self-pay | Admitting: Cardiovascular Disease

## 2014-02-07 ENCOUNTER — Encounter (INDEPENDENT_AMBULATORY_CARE_PROVIDER_SITE_OTHER): Payer: Self-pay

## 2014-02-07 ENCOUNTER — Ambulatory Visit (INDEPENDENT_AMBULATORY_CARE_PROVIDER_SITE_OTHER): Payer: Medicare Other | Admitting: Cardiovascular Disease

## 2014-02-07 VITALS — BP 118/70 | HR 91 | Ht 61.0 in | Wt 138.0 lb

## 2014-02-07 DIAGNOSIS — I5022 Chronic systolic (congestive) heart failure: Secondary | ICD-10-CM

## 2014-02-07 DIAGNOSIS — I351 Nonrheumatic aortic (valve) insufficiency: Secondary | ICD-10-CM

## 2014-02-07 DIAGNOSIS — I059 Rheumatic mitral valve disease, unspecified: Secondary | ICD-10-CM

## 2014-02-07 DIAGNOSIS — R05 Cough: Secondary | ICD-10-CM

## 2014-02-07 DIAGNOSIS — I359 Nonrheumatic aortic valve disorder, unspecified: Secondary | ICD-10-CM

## 2014-02-07 DIAGNOSIS — R059 Cough, unspecified: Secondary | ICD-10-CM

## 2014-02-07 DIAGNOSIS — I10 Essential (primary) hypertension: Secondary | ICD-10-CM

## 2014-02-07 DIAGNOSIS — I428 Other cardiomyopathies: Secondary | ICD-10-CM

## 2014-02-07 DIAGNOSIS — I340 Nonrheumatic mitral (valve) insufficiency: Secondary | ICD-10-CM

## 2014-02-07 DIAGNOSIS — I509 Heart failure, unspecified: Secondary | ICD-10-CM

## 2014-02-07 NOTE — Progress Notes (Signed)
History of Present Illness: 80 yo female with history with history of breast cancer, systolic LV dysfunction due to presumed non-ischemic cardiomyopathy who is here today for cardiac follow up. She was admitted to [facility] 07/26/13 with c/o SOB. She was found to have volume overload with BNP of 13,000. Chest x-ray with mild vascular congestion. Echo 07/27/13 with severe LVH, LVEF 40-45%, moderate AI, moderate MR, moderate TR. She was diuresed and discharged home 07/29/13.   She is here today for follow up. She tells me today that she feels great. No chest pain or SOB. No LE edema. Tolerating all meds. Weight is stable. Dry cough for 4 weeks. Seems to be improving. She has been on an [place].   Primary Care Physician:  Grant Fontana  Past Medical History  Diagnosis Date  . Breast cancer   . Cardiomyopathy   . Aortic insufficiency   . Mitral regurgitation     Past Surgical History  Procedure Laterality Date  . Mastectomy      left    Current Outpatient Prescriptions  Medication Sig Dispense Refill  . furosemide (LASIX) 40 MG tablet Take one tablet by mouth twice daily on Mon, Wed and Fri. Take one tablet by mouth daily the other days of the week.  40 tablet  6  . lisinopril (PRINIVIL,ZESTRIL) 5 MG tablet Take 1 tablet (5 mg total) by mouth daily.  30 tablet  11  . metoprolol tartrate (LOPRESSOR) 25 MG tablet Take 12.5 mg by mouth 2 (two) times daily.      . potassium chloride SA (K-DUR,KLOR-CON) 20 MEQ tablet Take 1 tablet (20 mEq total) by mouth daily.  30 tablet  0   No current facility-administered medications for this visit.    Allergies  Allergen Reactions  . Penicillins Swelling    History   Social History  . Marital Status: Widowed    Spouse Name: N/A    Number of Children: N/A  . Years of Education: N/A   Occupational History  . Not on file.   Social History Main Topics  . Smoking status: Former Smoker -- 50 years    Types: Cigarettes    Quit  date: 06/25/2012  . Smokeless tobacco: Current User    Types: Snuff  . Alcohol Use: No  . Drug Use: No  . Sexual Activity: Not on file   Other Topics Concern  . Not on file   Social History Narrative  . No narrative on file    Family History  Problem Relation Age of Onset  . Hypertension Mother     Review of Systems:  As stated in the HPI and otherwise negative.   BP 118/70  Pulse 91  Ht 5\' 1"  (1.549 m)  Wt 138 lb (62.596 kg)  BMI 26.09 kg/m2  SpO2 95%  Physical Examination: General: Well developed, well nourished, NAD HEENT: OP clear, mucus membranes moist SKIN: warm, dry. No rashes. Neuro: No focal deficits Musculoskeletal: Muscle strength 5/5 all ext Psychiatric: Mood and affect normal Neck: No JVD, no carotid bruits, no thyromegaly, no lymphadenopathy. Lungs:Clear bilaterally, no wheezes, rhonci, crackles Cardiovascular: Regular rate and rhythm. Systolic murmur. No gallops or rubs. Abdomen:Soft. Bowel sounds present. Non-tender.  Extremities: No lower extremity edema. Pulses are 2 + in the bilateral DP/PT.  Echo 07/27/13: Left ventricle: The cavity size was normal. Wall thickness was increased in a pattern of severe LVH. There was mild focal basal hypertrophy of the septum. Systolic function was mildly to  moderately reduced. The estimated ejection fraction was in the range of 40% to 45%. Diffuse hypokinesis. Doppler parameters are consistent with high ventricular filling pressure. - Aortic valve: Moderate regurgitation. - Mitral valve: Moderate regurgitation. - Left atrium: The atrium was moderately dilated. - Right atrium: The atrium was severely dilated. - Tricuspid valve: Moderate regurgitation. - Pulmonic valve: Moderate regurgitation. - Pulmonary arteries: PA peak pressure: 84mm Hg (S). - Pericardium, extracardiac: A trivial pericardial effusion was identified posterior to the heart.  Assessment and Plan:   1. Non-ischemic Cardiomyopathy: Unknown  etiology. Felt to be non-ischemic. She has no known obstructive CAD. Likely due to hypertension with severe LVH on echo. Will not pursue ischemic evaluation at this time. Will continue beta blocker and Ace-inh.    2. Chronic systolic CHF: Will continue Lasix 40 mg po daily with 40 mg po BID on Monday, Wednesday and Friday. Continue KDur.   3. Mitral regurgitation: Moderate by echo July 2014. Repeat echo August 2015.  4. Aortic insufficiency: Moderate by echo July 2014. Repeat echo August 2015.  5. HTN: BP controlled. No changes today.   6. Cough: She has had a cough for 4 weeks. Dry without mucus production. I have suggested changing Ace-inh to an ARB but she wishes to see if the cough resolves before doing

## 2014-02-07 NOTE — Patient Instructions (Signed)
Your physician wants you to follow-up in:  12 months. You will receive a reminder letter in the mail two months in advance. If you don't receive a letter, please call our office to schedule the follow-up appointment.  Your physician has requested that you have an echocardiogram. Echocardiography is a painless test that uses sound waves to create images of your heart. It provides your doctor with information about the size and shape of your heart and how well your heart's chambers and valves are working. This procedure takes approximately one hour. There are no restrictions for this procedure. To be done at the end of August

## 2014-05-10 ENCOUNTER — Telehealth: Payer: Self-pay | Admitting: Cardiovascular Disease

## 2014-05-10 NOTE — Telephone Encounter (Signed)
Left message to call back  

## 2014-05-10 NOTE — Telephone Encounter (Signed)
New problem    Pt needs a call back about her medications please.

## 2014-05-16 NOTE — Telephone Encounter (Signed)
Follow Up:  Pt is returning Pat's call.

## 2014-05-16 NOTE — Telephone Encounter (Signed)
Spoke with pt who reports she thinks her medications are making her sick. No medicine changes were made at last office visit. Change from Ace-inh to ARB was discussed but pt did not want to make change at that time.  She is continuing to have dry cough.  Other complaints are bilateral shoulder pain which is worse with movement of shoulders and swelling around tooth.  She will contact primary care and dentist regarding these.  She will continue all medications and I will send message to Dr. Angelena Form regarding on going cough.

## 2014-05-16 NOTE — Telephone Encounter (Signed)
We can change her to Cozaar 25 mg daily and stop Lisinopril. cdm

## 2014-05-17 MED ORDER — LOSARTAN POTASSIUM 25 MG PO TABS: 25.0000 mg | ORAL_TABLET | Freq: Every day | ORAL | Status: DC

## 2014-05-17 NOTE — Telephone Encounter (Signed)
Pt notified of medication change. Will send prescription for Cozaar to [facility] on [address].

## 2014-06-21 ENCOUNTER — Encounter (HOSPITAL_COMMUNITY): Payer: Self-pay | Admitting: Emergency Medicine

## 2014-06-21 ENCOUNTER — Observation Stay (HOSPITAL_COMMUNITY)
Admission: EM | Admit: 2014-06-21 | Discharge: 2014-06-24 | Disposition: A | Discharge status: Home / Self Care | Payer: Medicare Other | Attending: Family Medicine | Admitting: Family Medicine

## 2014-06-21 DIAGNOSIS — K2940 Chronic atrophic gastritis without bleeding: Secondary | ICD-10-CM | POA: Diagnosis not present

## 2014-06-21 DIAGNOSIS — I10 Essential (primary) hypertension: Secondary | ICD-10-CM | POA: Diagnosis not present

## 2014-06-21 DIAGNOSIS — I428 Other cardiomyopathies: Secondary | ICD-10-CM | POA: Diagnosis not present

## 2014-06-21 DIAGNOSIS — I509 Heart failure, unspecified: Secondary | ICD-10-CM | POA: Diagnosis not present

## 2014-06-21 DIAGNOSIS — N390 Urinary tract infection, site not specified: Secondary | ICD-10-CM

## 2014-06-21 DIAGNOSIS — I080 Rheumatic disorders of both mitral and aortic valves: Secondary | ICD-10-CM | POA: Diagnosis not present

## 2014-06-21 DIAGNOSIS — A048 Other specified bacterial intestinal infections: Secondary | ICD-10-CM | POA: Diagnosis not present

## 2014-06-21 DIAGNOSIS — I5022 Chronic systolic (congestive) heart failure: Secondary | ICD-10-CM

## 2014-06-21 DIAGNOSIS — D649 Anemia, unspecified: Secondary | ICD-10-CM | POA: Diagnosis present

## 2014-06-21 DIAGNOSIS — E440 Moderate protein-calorie malnutrition: Secondary | ICD-10-CM

## 2014-06-21 DIAGNOSIS — Z87891 Personal history of nicotine dependence: Secondary | ICD-10-CM | POA: Insufficient documentation

## 2014-06-21 DIAGNOSIS — R195 Other fecal abnormalities: Secondary | ICD-10-CM | POA: Diagnosis not present

## 2014-06-21 DIAGNOSIS — K319 Disease of stomach and duodenum, unspecified: Secondary | ICD-10-CM | POA: Insufficient documentation

## 2014-06-21 DIAGNOSIS — Z853 Personal history of malignant neoplasm of breast: Secondary | ICD-10-CM | POA: Insufficient documentation

## 2014-06-21 DIAGNOSIS — D131 Benign neoplasm of stomach: Secondary | ICD-10-CM | POA: Insufficient documentation

## 2014-06-21 DIAGNOSIS — Z79899 Other long term (current) drug therapy: Secondary | ICD-10-CM | POA: Insufficient documentation

## 2014-06-21 DIAGNOSIS — K922 Gastrointestinal hemorrhage, unspecified: Secondary | ICD-10-CM | POA: Insufficient documentation

## 2014-06-21 DIAGNOSIS — I5021 Acute systolic (congestive) heart failure: Secondary | ICD-10-CM

## 2014-06-21 DIAGNOSIS — Z9010 Acquired absence of unspecified breast and nipple: Secondary | ICD-10-CM | POA: Insufficient documentation

## 2014-06-21 DIAGNOSIS — D509 Iron deficiency anemia, unspecified: Secondary | ICD-10-CM | POA: Diagnosis present

## 2014-06-21 HISTORY — DX: Essential (primary) hypertension: I10

## 2014-06-21 HISTORY — DX: Heart failure, unspecified: I50.9

## 2014-06-21 HISTORY — DX: Anemia, unspecified: D64.9

## 2014-06-21 LAB — PREPARE RBC (CROSSMATCH)

## 2014-06-21 LAB — CBC WITH DIFFERENTIAL/PLATELET
BASOS PCT: 1 % (ref 0–1)
Basophils Absolute: 0 10*3/uL (ref 0.0–0.1)
Eosinophils Absolute: 0.2 10*3/uL (ref 0.0–0.7)
Eosinophils Relative: 3 % (ref 0–5)
HCT: 26.5 % — ABNORMAL LOW (ref 36.0–46.0)
Hemoglobin: 8.1 g/dL — ABNORMAL LOW (ref 12.0–15.0)
LYMPHS PCT: 42 % (ref 12–46)
Lymphs Abs: 2.3 10*3/uL (ref 0.7–4.0)
MCH: 27.9 pg (ref 26.0–34.0)
MCHC: 30.6 g/dL (ref 30.0–36.0)
MCV: 91.4 fL (ref 78.0–100.0)
Monocytes Absolute: 0.5 10*3/uL (ref 0.1–1.0)
Monocytes Relative: 9 % (ref 3–12)
NEUTROS ABS: 2.6 10*3/uL (ref 1.7–7.7)
NEUTROS PCT: 45 % (ref 43–77)
PLATELETS: 310 10*3/uL (ref 150–400)
RBC: 2.9 MIL/uL — ABNORMAL LOW (ref 3.87–5.11)
RDW: 14.5 % (ref 11.5–15.5)
WBC: 5.6 10*3/uL (ref 4.0–10.5)

## 2014-06-21 LAB — SAMPLE TO BLOOD BANK

## 2014-06-21 LAB — BASIC METABOLIC PANEL
BUN: 21 mg/dL (ref 6–23)
CALCIUM: 9.5 mg/dL (ref 8.4–10.5)
CO2: 23 meq/L (ref 19–32)
Chloride: 105 mEq/L (ref 96–112)
Creatinine, Ser: 1.14 mg/dL — ABNORMAL HIGH (ref 0.50–1.10)
GFR calc Af Amer: 51 mL/min — ABNORMAL LOW (ref >90)
GFR, EST NON AFRICAN AMERICAN: 44 mL/min — AB (ref >90)
Glucose, Bld: 121 mg/dL — ABNORMAL HIGH (ref 70–99)
Potassium: 4.7 mEq/L (ref 3.7–5.3)
SODIUM: 141 meq/L (ref 137–147)

## 2014-06-21 LAB — POC OCCULT BLOOD, ED: FECAL OCCULT BLD: POSITIVE — AB

## 2014-06-21 MED ORDER — SODIUM CHLORIDE 0.9 % IV SOLN: INTRAVENOUS | Status: AC

## 2014-06-21 MED ORDER — POTASSIUM CHLORIDE CRYS ER 10 MEQ PO TBCR
10.0000 meq | EXTENDED_RELEASE_TABLET | Freq: Two times a day (BID) | ORAL | Status: DC
  Filled 2014-06-21: qty 1

## 2014-06-21 MED ORDER — FUROSEMIDE 20 MG PO TABS
20.0000 mg | ORAL_TABLET | Freq: Every day | ORAL | Status: DC
  Administered 2014-06-21 – 2014-06-24 (×4): 20 mg via ORAL
  Filled 2014-06-21 (×5): qty 1

## 2014-06-21 MED ORDER — PEG-KCL-NACL-NASULF-NA ASC-C 100 G PO SOLR
0.5000 | Freq: Once | ORAL | Status: DC
  Filled 2014-06-21: qty 1

## 2014-06-21 MED ORDER — ONDANSETRON HCL 4 MG/2ML IJ SOLN: 4.0000 mg | Freq: Four times a day (QID) | INTRAMUSCULAR | Status: DC | PRN

## 2014-06-21 MED ORDER — ACETAMINOPHEN 650 MG RE SUPP
650.0000 mg | Freq: Four times a day (QID) | RECTAL | Status: DC | PRN
Start: 2014-06-21 — End: 2014-06-24

## 2014-06-21 MED ORDER — MORPHINE SULFATE 2 MG/ML IJ SOLN: 2.0000 mg | INTRAMUSCULAR | Status: DC | PRN

## 2014-06-21 MED ORDER — POTASSIUM CHLORIDE CRYS ER 10 MEQ PO TBCR
10.0000 meq | EXTENDED_RELEASE_TABLET | Freq: Two times a day (BID) | ORAL | Status: DC
  Administered 2014-06-21 – 2014-06-24 (×6): 10 meq via ORAL
  Filled 2014-06-21 (×8): qty 1

## 2014-06-21 MED ORDER — LOSARTAN POTASSIUM 25 MG PO TABS
25.0000 mg | ORAL_TABLET | Freq: Every day | ORAL | Status: DC
  Administered 2014-06-21 – 2014-06-24 (×4): 25 mg via ORAL
  Filled 2014-06-21 (×4): qty 1

## 2014-06-21 MED ORDER — METOPROLOL TARTRATE 25 MG PO TABS
25.0000 mg | ORAL_TABLET | Freq: Two times a day (BID) | ORAL | Status: DC
  Administered 2014-06-21 – 2014-06-24 (×6): 25 mg via ORAL
  Filled 2014-06-21 (×7): qty 1

## 2014-06-21 MED ORDER — PEG-KCL-NACL-NASULF-NA ASC-C 100 G PO SOLR: 1.0000 | Freq: Once | ORAL | Status: DC

## 2014-06-21 MED ORDER — SODIUM CHLORIDE 0.9 % IV SOLN: INTRAVENOUS | Status: DC

## 2014-06-21 MED ORDER — ACETAMINOPHEN 325 MG PO TABS: 650.0000 mg | ORAL_TABLET | Freq: Four times a day (QID) | ORAL | Status: DC | PRN

## 2014-06-21 MED ORDER — PEG-KCL-NACL-NASULF-NA ASC-C 100 G PO SOLR
0.5000 | Freq: Once | ORAL | Status: AC
  Administered 2014-06-21: 100 g via ORAL
  Filled 2014-06-21: qty 1

## 2014-06-21 MED ORDER — SODIUM CHLORIDE 0.9 % IV SOLN
INTRAVENOUS | Status: DC
  Administered 2014-06-22 – 2014-06-23 (×3): via INTRAVENOUS
  Administered 2014-06-23: 75 mL/h via INTRAVENOUS

## 2014-06-21 MED ORDER — ONDANSETRON HCL 4 MG PO TABS: 4.0000 mg | ORAL_TABLET | Freq: Four times a day (QID) | ORAL | Status: DC | PRN

## 2014-06-21 NOTE — Progress Notes (Signed)
Notified Walden Field NP about having colonoscopy tomorrow and pt refusing to drink second bottle of prep. Pt has completed almost one bottle and she stated that it makes her sick to her stomach. Offered nausea medication. She refused. NP Donnal Debar stated that she will let physician know about the pt refusal to drink prep. Will continue to monitor.

## 2014-06-21 NOTE — ED Provider Notes (Signed)
CSN: [id number]     Arrival date & time 06/21/14  8182 History   First MD Initiated Contact with Patient 06/21/14 0754     Chief Complaint  Patient presents with  . Abnormal Lab      HPI Pt was seen at 0755. Per pt, c/o gradual onset and persistence of constant "abnormal labs" for the past 1 week. Pt states she was evaluated by her PMD at the [facility] with routine labs checked. Pt states she was called by the office and told to come to the ED for further evaluation of "abnormal labs." Pt states she cannot remember what lab was abnormal, just that "something was 7.4" and "I might need a blood transfusion." Pt only c/o persistent generalized weakness/fatigue for the past few months, worse over the past few weeks. Pt's family states pt "has been sleeping a lot lately." Denies CP/palpitations, no SOB/cough, no abd pain, no N/V/D, no fevers, no black or blood in stools.      Past Medical History  Diagnosis Date  . Breast cancer   . Cardiomyopathy   . Aortic insufficiency   . Mitral regurgitation   . Anemia   . CHF (congestive heart failure)   . Hypertension    Past Surgical History  Procedure Laterality Date  . Mastectomy Left    Family History  Problem Relation Age of Onset  . Hypertension Mother    History  Substance Use Topics  . Smoking status: Former Smoker -- 50 years    Types: Cigarettes    Quit date: 06/25/2012  . Smokeless tobacco: Current User    Types: Snuff  . Alcohol Use: No    Review of Systems ROS: Statement: All systems negative except as marked or noted in the HPI; Constitutional: Negative for fever and chills. +generalized weakness/fatigue.; ; Eyes: Negative for eye pain, redness and discharge. ; ; ENMT: Negative for ear pain, hoarseness, nasal congestion, sinus pressure and sore throat. ; ; Cardiovascular: Negative for chest pain, palpitations, diaphoresis, dyspnea and peripheral edema. ; ; Respiratory: Negative for cough, wheezing and stridor. ;  ; Gastrointestinal: Negative for nausea, vomiting, diarrhea, abdominal pain, blood in stool, hematemesis, jaundice and rectal bleeding. . ; ; Genitourinary: Negative for dysuria, flank pain and hematuria. ; ; Musculoskeletal: Negative for back pain and neck pain. Negative for swelling and trauma.; ; Skin: Negative for pruritus, rash, abrasions, blisters, bruising and skin lesion.; ; Neuro: Negative for headache, lightheadedness and neck stiffness. Negative for altered level of consciousness , altered mental status, extremity weakness, paresthesias, involuntary movement, seizure and syncope.      Allergies  Penicillins  Home Medications   Prior to Admission medications   Medication Sig Start Date End Date Taking? Authorizing Provider  furosemide (LASIX) 40 MG tablet Take one tablet by mouth twice daily on Mon, Wed and Fri. Take one tablet by mouth daily the other days of the week. 11/07/13   Burnell Blanks, MD  losartan (COZAAR) 25 MG tablet Take 1 tablet (25 mg total) by mouth daily. 05/17/14   Burnell Blanks, MD  metoprolol tartrate (LOPRESSOR) 25 MG tablet Take 12.5 mg by mouth 2 (two) times daily.    Historical Provider, MD  potassium chloride SA (K-DUR,KLOR-CON) 20 MEQ tablet Take 1 tablet (20 mEq total) by mouth daily. 07/29/13   Domenic Polite, MD   BP 135/63  Pulse 86  Temp(Src) 98.3 F (36.8 C) (Oral)  Resp 15  SpO2 98% Physical Exam 0800: Physical examination:  Nursing notes reviewed; Vital signs and O2 SAT reviewed;  Constitutional: Well developed, Well nourished, Well hydrated, In no acute distress; Head:  Normocephalic, atraumatic; Eyes: EOMI, PERRL, No scleral icterus. Conjunctiva pale.; ENMT: Mouth and pharynx normal, Mucous membranes moist; Neck: Supple, Full range of motion, No lymphadenopathy; Cardiovascular: Regular rate and rhythm, No gallop; Respiratory: Breath sounds clear & equal bilaterally, No wheezes.  Speaking full sentences with ease, Normal respiratory  effort/excursion; Chest: Nontender, Movement normal; Abdomen: Soft, Nontender, Nondistended, Normal bowel sounds. Rectal exam performed w/permission of pt and ED RN chaperone present.  Anal tone normal.  Non-tender, soft brown stool in rectal vault, heme positive.  No fissures, no external hemorrhoids, no palp masses.; Genitourinary: No CVA tenderness; Extremities: Pulses normal, No tenderness, No edema, No calf edema or asymmetry.; Neuro: AA&Ox3, Major CN grossly intact.  Speech clear. No gross focal motor or sensory deficits in extremities. Climbs on and off stretcher easily by herself. Gait steady.; Skin: Color pale, Warm, Dry.    ED Course  Procedures     EKG Interpretation None      MDM  MDM Reviewed: previous chart, nursing note and vitals Reviewed previous: labs Interpretation: labs Total time providing critical care: 30-74 minutes. This excludes time spent performing separately reportable procedures and services. Consults: admitting MD and gastrointestinal   CRITICAL CARE Performed by: Alfonzo Feller Total critical care time: 35 Critical care time was exclusive of separately billable procedures and treating other patients. Critical care was necessary to treat or prevent imminent or life-threatening deterioration. Critical care was time spent personally by me on the following activities: development of treatment plan with patient and/or surrogate as well as nursing, discussions with consultants, evaluation of patient's response to treatment, examination of patient, obtaining history from patient or surrogate, ordering and performing treatments and interventions, ordering and review of laboratory studies, ordering and review of radiographic studies, pulse oximetry and re-evaluation of patient's condition.   Results for orders placed during the hospital encounter of 27/06/23  BASIC METABOLIC PANEL      Result Value Ref Range   Sodium 141  137 - 147 mEq/L   Potassium 4.7   3.7 - 5.3 mEq/L   Chloride 105  96 - 112 mEq/L   CO2 23  19 - 32 mEq/L   Glucose, Bld 121 (*) 70 - 99 mg/dL   BUN 21  6 - 23 mg/dL   Creatinine, Ser 1.14 (*) 0.50 - 1.10 mg/dL   Calcium 9.5  8.4 - 10.5 mg/dL   GFR calc non Af Amer 44 (*) >90 mL/min   GFR calc Af Amer 51 (*) >90 mL/min  CBC WITH DIFFERENTIAL      Result Value Ref Range   WBC 5.6  4.0 - 10.5 K/uL   RBC 2.90 (*) 3.87 - 5.11 MIL/uL   Hemoglobin 8.1 (*) 12.0 - 15.0 g/dL   HCT 26.5 (*) 36.0 - 46.0 %   MCV 91.4  78.0 - 100.0 fL   MCH 27.9  26.0 - 34.0 pg   MCHC 30.6  30.0 - 36.0 g/dL   RDW 14.5  11.5 - 15.5 %   Platelets 310  150 - 400 K/uL   Neutrophils Relative % 45  43 - 77 %   Neutro Abs 2.6  1.7 - 7.7 K/uL   Lymphocytes Relative 42  12 - 46 %   Lymphs Abs 2.3  0.7 - 4.0 K/uL   Monocytes Relative 9  3 - 12 %   Monocytes Absolute  0.5  0.1 - 1.0 K/uL   Eosinophils Relative 3  0 - 5 %   Eosinophils Absolute 0.2  0.0 - 0.7 K/uL   Basophils Relative 1  0 - 1 %   Basophils Absolute 0.0  0.0 - 0.1 K/uL  POC OCCULT BLOOD, ED      Result Value Ref Range   Fecal Occult Bld POSITIVE (*) NEGATIVE  SAMPLE TO BLOOD BANK      Result Value Ref Range   Blood Bank Specimen SAMPLE AVAILABLE FOR TESTING     Sample Expiration 06/24/2014      0945:   Will transfuse PRBC for symptomatic anemia. Dx and testing d/w pt and family.  Questions answered.  Verb understanding, agreeable to admit. T/C to [facility]. Wyline Copas, case discussed, including:  HPI, pertinent PM/SHx, VS/PE, dx testing, ED course and treatment:  Agreeable to admit, requests to keep NPO, call GI MD for consult, write temporary orders, obtain observation medical bed to team 8. T/C to GI PA Amy, case discussed, including:  HPI, pertinent PM/SHx, VS/PE, dx testing, ED course and treatment:  Agreeable to consult.       Alfonzo Feller, DO 06/22/14 2138

## 2014-06-21 NOTE — ED Notes (Signed)
Pt sts her PCP sent her here for abnormal lab results, unsure which lab, however reports she was told "something was 7.4"  Dr Thurnell Garbe at bedside

## 2014-06-21 NOTE — ED Notes (Signed)
Admitting MD at bedside.

## 2014-06-21 NOTE — ED Notes (Signed)
Pt states goes to [facility].  Pt went to MD for routine labs.  Told to come in and discuss results.  Called back to MD office and told to come here to get more blood work.  Pt cannot tell me lab or level.  Suggesting possible blood transfusion.

## 2014-06-21 NOTE — ED Notes (Signed)
Bed: WA05 Expected date:  Expected time:  Means of arrival:  Comments: 

## 2014-06-21 NOTE — H&P (Signed)
[facility] History and Physical  Valerie Downs ZOX:[id number] DOB: July 16, 1930 DOA: 06/21/2014  Referring physician: Emergency Department PCP: User Centricity, MD  Specialists:   Chief Complaint: Anemia  HPI: Valerie Downs is a 80 y.o. female  With a hx of CHF, AS, MR, HTN who presents with an abnormally low hgb from clinic. In the ED, repeat hgb was found to be 8.1 (was reportedly 7.4 outpatient). Pt found to be heme positive. GI was consulted and hospitalist consulted for consideration for admission.  On further questioning, pt reports last normal screening colon done out of town 9yrs ago. Denies melena or brbpr. Denies abd pain. Denies any significant unintentional weight loss  Review of Systems:  Per above, the remainder of the 10pt ros reviewed and are neg  Past Medical History  Diagnosis Date  . Breast cancer   . Cardiomyopathy   . Aortic insufficiency   . Mitral regurgitation   . Anemia   . CHF (congestive heart failure)   . Hypertension    Past Surgical History  Procedure Laterality Date  . Mastectomy Left    Social History:  reports that she quit smoking about 1 years ago. Her smoking use included Cigarettes. She smoked 0.00 packs per day for 50 years. Her smokeless tobacco use includes Snuff. She reports that she does not drink alcohol or use illicit drugs.  where does patient live--home, ALF, SNF? and with whom if at home?  Can patient participate in ADLs?  Allergies  Allergen Reactions  . Penicillins Anaphylaxis and Swelling    Family History  Problem Relation Age of Onset  . Hypertension Mother     (be sure to complete)  Prior to Admission medications   Medication Sig Start Date End Date Taking? Authorizing Provider  furosemide (LASIX) 20 MG tablet Take 20 mg by mouth daily.   Yes Historical Provider, MD  losartan (COZAAR) 25 MG tablet Take 1 tablet (25 mg total) by mouth daily. 05/17/14  Yes Burnell Blanks, MD  metoprolol tartrate  (LOPRESSOR) 25 MG tablet Take 25 mg by mouth 2 (two) times daily.    Yes Historical Provider, MD  potassium chloride SA (K-DUR,KLOR-CON) 20 MEQ tablet Take 10 mEq by mouth 2 (two) times daily.   Yes Historical Provider, MD   Physical Exam: Filed Vitals:   06/21/14 0746  BP: 135/63  Pulse: 86  Temp: 98.3 F (36.8 C)  TempSrc: Oral  Resp: 15  SpO2: 98%     General:  Awake, in nad  Eyes: PERRL B  ENT: membranes moist, dentition fair  Neck: trachea midline, neck supple  Cardiovascular: regular, s1, s2  Respiratory: normal resp effort, no wheezing  Abdomen: soft, nondistended  Skin: normal skin turgor, no abnormal skin lesions seen  Musculoskeletal: perfuse, no clubbing  Psychiatric: mood/affect normal // no auditory/visual hallucinations  Neurologic: cn2-12 grossly intact, strength/sensation intact  Labs on Admission:  Basic Metabolic Panel:  Recent Labs Lab 06/21/14 0814  NA 141  K 4.7  CL 105  CO2 23  GLUCOSE 121*  BUN 21  CREATININE 1.14*  CALCIUM 9.5   Liver Function Tests: No results found for this basename: AST, ALT, ALKPHOS, BILITOT, PROT, ALBUMIN,  in the last 168 hours No results found for this basename: LIPASE, AMYLASE,  in the last 168 hours No results found for this basename: AMMONIA,  in the last 168 hours CBC:  Recent Labs Lab 06/21/14 0814  WBC 5.6  NEUTROABS 2.6  HGB 8.1*  HCT 26.5*  MCV 91.4  PLT 310   Cardiac Enzymes: No results found for this basename: CKTOTAL, CKMB, CKMBINDEX, TROPONINI,  in the last 168 hours  BNP (last 3 results)  Recent Labs  07/26/13 1745 07/26/13 2245  PROBNP 13354.0* 13350.0*   CBG: No results found for this basename: GLUCAP,  in the last 168 hours  Radiological Exams on Admission: No results found.   Assessment/Plan Principal Problem:   Symptomatic anemia Active Problems:   HYPERTENSION, BENIGN SYSTEMIC   Iron deficiency anemia   Chronic systolic CHF (congestive heart  failure)  1. Anemia 1. Blood tx ordered through Ed 2. Will follow CBC and transfuse as needed 3. GI consulted through the ED 4. Labs from 2014 noted mild iron deficiency 5. Will admit to med-surg 2. HTN 1. BP stable 2. Cont current regimen 3. Chronic systolic chf 1. Stable and compensated 2. Cont lasix per home regimen 4. DVT prophylaxis 1. SCD's  Code Status: Full (must indicate code status--if unknown or must be presumed, indicate so) Family Communication: Pt and daughter in room Disposition Plan: Pending  Time spent: 16min  [facility], [facility] Pager [phone]  If 7PM-7AM, please contact night-coverage [email or web address] Password [facility] 06/21/2014, 10:15 AM

## 2014-06-21 NOTE — ED Notes (Signed)
Pt ambulated around the department O2 96-98% on room air; heart rate 72-77

## 2014-06-21 NOTE — Consult Note (Signed)
Referring Provider: [facility] Primary Care Physician:   Dr. Jimmye Norman Primary Gastroenterologist:  none  Reason for Consultation:  Anemia, heme positive stool  HPI: Valerie Downs is a 80 y.o. female admitted earlier this morning through the emergency room after advised by her primary care physician to come to the ER for remission as her hemoglobin was quite low. She says she had been seen on Wednesday at her primary care office and was complaining at that time of fatigue. Apparently hemoglobin was 7.4 however when repeated in the ER this morning was 8.1. Patient says she has been feeling poorly and extremely tired over the past several weeks. She's also had some intermittent lightheadedness. Her appetite has been poor but she has not had any weight loss that she is aware of. She says she normally has a bowel movement about every 3 days and has not noted any melena or hematochezia. She has no complaints of abdominal pain nausea vomiting heartburn dysphagia. She has not been on any regular aspirin or NSAIDs. Review of records from 2014 shows that she was noted to be anemic when hospitalized in August of 2014 with congestive heart failure. At that time she had a hemoglobin of 7.6 and was iron deficient with an iron of 32 TIBC of 274 iron sat of 12. She was Hemoccult-negative. He was mentioned that she should have outpatient workup but that has not been done. Patient says she had a colonoscopy about 12 years ago in [place] and was told this was "OK". Family history is negative for colon cancer polyps. Patient has history of previous breast cancer, a nonischemic cardiomyopathy with an EF of 40-45% and is followed by [facility] cardiology. Stool is noted to be Hemoccult-positive this morning   Past Medical History  Diagnosis Date  . Breast cancer   . Cardiomyopathy   . Aortic insufficiency   . Mitral regurgitation   . Anemia   . CHF (congestive heart failure)   . Hypertension     Past  Surgical History  Procedure Laterality Date  . Mastectomy Left     Prior to Admission medications   Medication Sig Start Date End Date Taking? Authorizing Provider  furosemide (LASIX) 20 MG tablet Take 20 mg by mouth daily.   Yes Historical Provider, MD  losartan (COZAAR) 25 MG tablet Take 1 tablet (25 mg total) by mouth daily. 05/17/14  Yes Burnell Blanks, MD  metoprolol tartrate (LOPRESSOR) 25 MG tablet Take 25 mg by mouth 2 (two) times daily.    Yes Historical Provider, MD  potassium chloride SA (K-DUR,KLOR-CON) 20 MEQ tablet Take 10 mEq by mouth 2 (two) times daily.   Yes Historical Provider, MD    Current Facility-Administered Medications  Medication Dose Route Frequency Provider Last Rate Last Dose  . 0.9 %  sodium chloride infusion   Intravenous Continuous Alfonzo Feller, DO      . 0.9 %  sodium chloride infusion   Intravenous STAT Alfonzo Feller, DO      . 0.9 %  sodium chloride infusion   Intravenous Continuous Donne Hazel, MD      . acetaminophen (TYLENOL) tablet 650 mg  650 mg Oral Q6H PRN Donne Hazel, MD       Or  . acetaminophen (TYLENOL) suppository 650 mg  650 mg Rectal Q6H PRN Donne Hazel, MD      . furosemide (LASIX) tablet 20 mg  20 mg Oral Daily Donne Hazel, MD      .  losartan (COZAAR) tablet 25 mg  25 mg Oral Daily Donne Hazel, MD      . metoprolol tartrate (LOPRESSOR) tablet 25 mg  25 mg Oral BID Donne Hazel, MD      . morphine 2 MG/ML injection 2 mg  2 mg Intravenous Q4H PRN Donne Hazel, MD      . ondansetron [facility]) tablet 4 mg  4 mg Oral Q6H PRN Donne Hazel, MD       Or  . ondansetron [facility]) injection 4 mg  4 mg Intravenous Q6H PRN Donne Hazel, MD      . potassium chloride (K-DUR,KLOR-CON) CR tablet 10 mEq  10 mEq Oral BID Donne Hazel, MD        Allergies as of 06/21/2014 - Review Complete 06/21/2014  Allergen Reaction Noted  . Penicillins Anaphylaxis and Swelling 05/08/2011    Family History  Problem  Relation Age of Onset  . Hypertension Mother     History   Social History  . Marital Status: Widowed    Spouse Name: N/A    Number of Children: N/A  . Years of Education: N/A   Occupational History  . Not on file.   Social History Main Topics  . Smoking status: Former Smoker -- 50 years    Types: Cigarettes    Quit date: 06/25/2012  . Smokeless tobacco: Current User    Types: Snuff  . Alcohol Use: No  . Drug Use: No  . Sexual Activity: No   Other Topics Concern  . Not on file   Social History Narrative  . No narrative on file    Review of Systems: Pertinent positive and negative review of systems were noted in the above HPI section.  All other review of systems was otherwise negative.  Physical Exam: Vital signs in last 24 hours: Temp:  [98 F (36.7 C)-98.3 F (36.8 C)] 98 F (36.7 C) (06/26 1115) Pulse Rate:  [65-86] 65 (06/26 1115) Resp:  [15-18] 18 (06/26 1115) BP: (135-156)/(63-72) 156/67 mmHg (06/26 1115) SpO2:  [97 %-100 %] 100 % (06/26 1115) Weight:  [141 lb (63.957 kg)] 141 lb (63.957 kg) (06/26 1115)   General:   Alert,  Well-developed, well-nourished, elderly female,pleasant and cooperative in NAD Head:  Normocephalic and atraumatic. Eyes:  Sclera clear, no icterus.   Conjunctiva pale  Ears:  Normal auditory acuity. Nose:  No deformity, discharge,  or lesions. Mouth:  No deformity or lesions.   Neck:  Supple; no masses or thyromegaly. Lungs:  Clear throughout to auscultation.   No wheezes, crackles, or rhonchi. Heart:  Regular rate and rhythm; soft murmurs, Abdomen:  Soft,nontender, BS active,nonpalp mass or hsm.   Rectal:  Deferred ,documented heme positive in ER Msk:  Symmetrical without gross deformities. . Pulses:  Normal pulses noted. Extremities:  Without clubbing or edema. Neurologic:  Alert and  oriented x4;  grossly normal neurologically. Skin:  Intact without significant lesions or rashes.. Psych:  Alert and cooperative. Normal mood  and affect.  Intake/Output from previous day:   Intake/Output this shift: Total I/O In: 250 [I.V.:250] Out: -   Lab Results:  Recent Labs  06/21/14 0814  WBC 5.6  HGB 8.1*  HCT 26.5*  PLT 310   BMET  Recent Labs  06/21/14 0814  NA 141  K 4.7  CL 105  CO2 23  GLUCOSE 121*  BUN 21  CREATININE 1.14*  CALCIUM 9.5   LFT No results found for this basename:  PROT, ALBUMIN, AST, ALT, ALKPHOS, BILITOT, BILIDIR, IBILI,  in the last 72 hours PT/INR No results found for this basename: LABPROT, INR,  in the last 72 hours Hepatitis Panel No results found for this basename: HEPBSAG, HCVAB, HEPAIGM, HEPBIGM,  in the last 72 hours    MPRESSION:  #32  80 year old female with a normocytic presumably iron deficient anemia symptomatic with fatigue and lightheadedness. Patient documented Hemoccult-positive. This patient appears to have a chronic iron deficiency anemia dating at least back to last summer when her hemoglobin was 7.6 and iron studies were consistent with iron deficiency. She was Hemoccult-negative at that time and no GI workup pursued. Rule out occult colon neoplasm, AVMs or other upper or lower GI source for chronic GI blood loss #2 congestive heart failure with nonischemic cardiomyopathy EF 40 of 45% #3 hypertension #4 remote history of breast cancer  PLAN: Transfuse to keep hemoglobin in the 8-9 range Iron studies, and start oral iron Will plan for colonoscopy+/ -EGD tomorrow with Dr. Hilarie Fredrickson. Procedures discussed in detail with the patient and she is agreeable to proceed. Will start prep this evening Clear liquid diet Further plans pending results of endoscopic evaluation   Amy Esterwood  06/21/2014, 11:25 AM

## 2014-06-21 NOTE — ED Notes (Signed)
Pt reluctantly signed cosent for blood transfusion, sts she really doesn't want it and wants [place] to hold off with starting transfusion and to check if there is anything else that we can do prior to it. Will notify Dr Thurnell Garbe.

## 2014-06-21 NOTE — Consult Note (Signed)
Patient seen, examined, and I agree with the above documentation, including the assessment and plan. Iron deficiency anemia with heme positive stool Agree with transfusion to keep hemoglobin greater than 8 g/dL, followup iron studies and begin iron replacement An EGD colonoscopy tomorrow The nature of the procedure, as well as the risks, benefits, and alternatives were carefully and thoroughly reviewed with the patient. Ample time for discussion and questions allowed. The patient understood, was satisfied, and agreed to proceed.

## 2014-06-22 ENCOUNTER — Encounter (HOSPITAL_COMMUNITY)
Admission: EM | Disposition: A | Discharge status: Home / Self Care | Payer: Self-pay | Source: Home / Self Care | Attending: Emergency Medicine

## 2014-06-22 ENCOUNTER — Encounter (HOSPITAL_COMMUNITY): Payer: Self-pay | Admitting: *Deleted

## 2014-06-22 DIAGNOSIS — D509 Iron deficiency anemia, unspecified: Secondary | ICD-10-CM

## 2014-06-22 DIAGNOSIS — K2940 Chronic atrophic gastritis without bleeding: Secondary | ICD-10-CM | POA: Diagnosis not present

## 2014-06-22 DIAGNOSIS — R195 Other fecal abnormalities: Secondary | ICD-10-CM

## 2014-06-22 HISTORY — PX: COLONOSCOPY: SHX5424

## 2014-06-22 HISTORY — PX: ESOPHAGOGASTRODUODENOSCOPY: SHX5428

## 2014-06-22 LAB — CBC
HCT: 28.8 % — ABNORMAL LOW (ref 36.0–46.0)
Hemoglobin: 8.9 g/dL — ABNORMAL LOW (ref 12.0–15.0)
MCH: 28 pg (ref 26.0–34.0)
MCHC: 30.9 g/dL (ref 30.0–36.0)
MCV: 90.6 fL (ref 78.0–100.0)
Platelets: 271 10*3/uL (ref 150–400)
RBC: 3.18 MIL/uL — ABNORMAL LOW (ref 3.87–5.11)
RDW: 14.7 % (ref 11.5–15.5)
WBC: 5.4 10*3/uL (ref 4.0–10.5)

## 2014-06-22 LAB — COMPREHENSIVE METABOLIC PANEL
ALT: 5 U/L (ref 0–35)
AST: 13 U/L (ref 0–37)
Albumin: 3.2 g/dL — ABNORMAL LOW (ref 3.5–5.2)
Alkaline Phosphatase: 95 U/L (ref 39–117)
BUN: 16 mg/dL (ref 6–23)
CO2: 22 meq/L (ref 19–32)
CREATININE: 1.02 mg/dL (ref 0.50–1.10)
Calcium: 9.6 mg/dL (ref 8.4–10.5)
Chloride: 109 mEq/L (ref 96–112)
GFR, EST AFRICAN AMERICAN: 58 mL/min — AB (ref >90)
GFR, EST NON AFRICAN AMERICAN: 50 mL/min — AB (ref >90)
GLUCOSE: 87 mg/dL (ref 70–99)
Potassium: 5.3 mEq/L (ref 3.7–5.3)
Sodium: 141 mEq/L (ref 137–147)
TOTAL PROTEIN: 7 g/dL (ref 6.0–8.3)
Total Bilirubin: 0.4 mg/dL (ref 0.3–1.2)

## 2014-06-22 SURGERY — COLONOSCOPY
Anesthesia: Moderate Sedation

## 2014-06-22 MED ORDER — MIDAZOLAM HCL 10 MG/2ML IJ SOLN
INTRAMUSCULAR | Status: AC
  Filled 2014-06-22: qty 2

## 2014-06-22 MED ORDER — FENTANYL CITRATE 0.05 MG/ML IJ SOLN
INTRAMUSCULAR | Status: DC | PRN
  Administered 2014-06-22 (×2): 25 ug via INTRAVENOUS

## 2014-06-22 MED ORDER — BUTAMBEN-TETRACAINE-BENZOCAINE 2-2-14 % EX AERO
INHALATION_SPRAY | CUTANEOUS | Status: DC | PRN
  Administered 2014-06-22: 2 via TOPICAL

## 2014-06-22 MED ORDER — FERROUS SULFATE 325 (65 FE) MG PO TABS
325.0000 mg | ORAL_TABLET | Freq: Two times a day (BID) | ORAL | Status: DC
  Administered 2014-06-22 – 2014-06-24 (×4): 325 mg via ORAL
  Filled 2014-06-22 (×6): qty 1

## 2014-06-22 MED ORDER — MIDAZOLAM HCL 10 MG/2ML IJ SOLN
INTRAMUSCULAR | Status: DC | PRN
  Administered 2014-06-22: 2 mg via INTRAVENOUS
  Administered 2014-06-22: 1 mg via INTRAVENOUS
  Administered 2014-06-22: 2 mg via INTRAVENOUS

## 2014-06-22 MED ORDER — FENTANYL CITRATE 0.05 MG/ML IJ SOLN
INTRAMUSCULAR | Status: AC
  Filled 2014-06-22: qty 2

## 2014-06-22 NOTE — Progress Notes (Signed)
UR Completed.  Downs, Valerie Jane [phone] 06/22/2014  

## 2014-06-22 NOTE — Progress Notes (Addendum)
Notes indicate patient refused second portion of prep She reports her stools are muddy brown this morning Will not be able to proceed with colonoscopy due to incomplete preparation; if patient willing can repeat prep for colonoscopy tomorrow The nature of the procedure, as well as the risks, benefits, and alternatives were carefully and thoroughly reviewed with the patient. Ample time for discussion and questions allowed. The patient understood, was satisfied, and agreed to proceed.   Addendum: For sedation for EGD patient had bowel movement and endoscopy lab. Stool light brown and watery, will attempt colonoscopy today, as previously recommended. If visualization poor, will abort procedure and reprep for tomorrow. Patient agreeable as above

## 2014-06-22 NOTE — Progress Notes (Signed)
[facility] PROGRESS NOTE  Valerie Downs TZG:[id number] DOB: [birth year] DOA: 06/21/2014 PCP: User Centricity, MD  Assessment/Plan: Principal Problem:   Symptomatic anemia -GI workup currently. Patient was unable to get proper GI workup secondary to compliance with Prep prior to colonoscopy. - Plan will be to try prep again for further evaluation.  Active Problems:   HYPERTENSION, BENIGN SYSTEMIC   Iron deficiency anemia - Continue iron supplementation unless otherwise indicated by GI given plans for colonoscopy    Chronic systolic CHF (congestive heart failure) - Currently compensated continue ARB and beta blocker    GI bleed - Continue to monitor blood levels    Heme positive stool - Please see plan as listed above  Code Status: Full Family Communication: Discussed with patient and family at bedside Disposition Plan: Pending further workup   Consultants:  Gastroenterologist: Dr. Hilarie Fredrickson  Procedures:  Colonoscopy attempted  Antibiotics:  None  HPI/Subjective: The patient has no new complaints  Objective: Filed Vitals:   06/22/14 1316  BP: 136/67  Pulse: 67  Temp: 96.8 F (36 C)  Resp: 16    Intake/Output Summary (Last 24 hours) at 06/22/14 1536 Last data filed at 06/22/14 1317  Gross per 24 hour  Intake 3375.75 ml  Output    450 ml  Net 2925.75 ml   Filed Weights   06/21/14 1115  Weight: 63.957 kg (141 lb)    Exam:   General:  Pt in nad, alert and awake  Cardiovascular: no cyanosis at extremities  Respiratory: cta bl, no wheezes  Abdomen: Nd, NT  Musculoskeletal: no cyanosis or clubbing   Data Reviewed: Basic Metabolic Panel:  Recent Labs Lab 06/21/14 0814 06/22/14 0532  NA 141 141  K 4.7 5.3  CL 105 109  CO2 23 22  GLUCOSE 121* 87  BUN 21 16  CREATININE 1.14* 1.02  CALCIUM 9.5 9.6   Liver Function Tests:  Recent Labs Lab 06/22/14 0532  AST 13  ALT 5  ALKPHOS 95  BILITOT 0.4  PROT 7.0  ALBUMIN 3.2*   No  results found for this basename: LIPASE, AMYLASE,  in the last 168 hours No results found for this basename: AMMONIA,  in the last 168 hours CBC:  Recent Labs Lab 06/21/14 0814 06/22/14 0532  WBC 5.6 5.4  NEUTROABS 2.6  --   HGB 8.1* 8.9*  HCT 26.5* 28.8*  MCV 91.4 90.6  PLT 310 271   Cardiac Enzymes: No results found for this basename: CKTOTAL, CKMB, CKMBINDEX, TROPONINI,  in the last 168 hours BNP (last 3 results)  Recent Labs  07/26/13 1745 07/26/13 2245  PROBNP 13354.0* 13350.0*   CBG: No results found for this basename: GLUCAP,  in the last 168 hours  No results found for this or any previous visit (from the past 240 hour(s)).   Studies: No results found.  Scheduled Meds: . ferrous sulfate  325 mg Oral BID WC  . furosemide  20 mg Oral Daily  . losartan  25 mg Oral Daily  . metoprolol tartrate  25 mg Oral BID  . potassium chloride SA  10 mEq Oral BID   Continuous Infusions: . sodium chloride    . sodium chloride 75 mL/hr at 06/22/14 0257     Time spent: > 35 minutes    Velvet Bathe  [facility] Pager [phone] If 7PM-7AM, please contact night-coverage at [email or web address], password [facility] 06/22/2014, 3:36 PM  LOS: 1 day

## 2014-06-22 NOTE — Op Note (Signed)
[facility] [place], [address]   ENDOSCOPY PROCEDURE REPORT  PATIENT: Donelda, Mailhot  MR#: [id number] BIRTHDATE: 1930/05/18 , 83  yrs. old GENDER: Female ENDOSCOPIST: Jerene Bears, MD REFERRED BY:  [facility] PROCEDURE DATE:  06/22/2014 PROCEDURE:  EGD w/ biopsy for H.pylori ASA CLASS:     Class III INDICATIONS:  Iron deficiency anemia.   Heme positive stool. MEDICATIONS: These medications were titrated to patient response per physician's verbal order, Fentanyl 50 mcg IV, and Versed 5 mg IV TOPICAL ANESTHETIC: Cetacaine Spray  DESCRIPTION OF PROCEDURE: After the risks benefits and alternatives of the procedure were thoroughly explained, informed consent was obtained.  The Hunter [id number] endoscope was introduced through the mouth and advanced to the second portion of the duodenum. Without limitations.  The instrument was slowly withdrawn as the mucosa was fully examined.  ESOPHAGUS: The mucosa of the esophagus appeared normal.  STOMACH: Mild nodular gastritis (inflammation) was found in the gastric body and gastric antrum.  Biopsies were taken in the gastric body, antrum and angularis.  Biopsy specimens include nodular areas.  A few small, benign-appearing, sessile polyps were found in the cardia and gastric body.  DUODENUM: The duodenal mucosa showed no abnormalities in the bulb and second portion of the duodenum.  Retroflexed views revealed no abnormalities.     The scope was then withdrawn from the patient and the procedure completed.  COMPLICATIONS: There were no complications. ENDOSCOPIC IMPRESSION: 1.   The mucosa of the esophagus appeared normal 2.   Mild nodular gastritis (inflammation) was found in the gastric body and gastric antrum; biopsies were taken in the antrum and angularis to exclude H. Pylori which can be associated with iron deficiency 3.   Few small, benign, sessile polyps were found in the cardia  and gastric body 4.   The duodenal mucosa showed no abnormalities in the bulb and second portion of the duodenum  RECOMMENDATIONS: 1.  Await biopsy results 2.  Follow-up of helicobacter pylori status, treat if indicated 3.  Daily PPI 4.  Colonoscopy today  eSigned:  Jerene Bears, MD 06/22/2014 10:10 AM      CC:The Patient

## 2014-06-22 NOTE — Op Note (Signed)
[facility] [place], [address]   COLONOSCOPY PROCEDURE REPORT  PATIENT: Valerie Downs, Valerie Downs  MR#: [id number] BIRTHDATE: 07/12/1930 , 83  yrs. old GENDER: Female ENDOSCOPIST: Jerene Bears, MD REFERRED XN:[facility] PROCEDURE DATE:  06/22/2014 PROCEDURE:   Colonoscopy, diagnostic First Screening Colonoscopy - Avg.  risk and is 50 yrs.  old or older - No.  Prior Negative Screening - Now for repeat screening. N/A  History of Adenoma - Now for follow-up colonoscopy & has been > or = to 3 yrs.  N/A  Polyps Removed Today? No.  Recommend repeat exam, <10 yrs? No. ASA CLASS:   Class III INDICATIONS:Iron Deficiency Anemia and heme-positive stool. MEDICATIONS: These medications were titrated to patient response per physician's verbal order, Versed 5 mg IV, and Fentanyl 50 mcg IV  DESCRIPTION OF PROCEDURE:   After the risks benefits and alternatives of the procedure were thoroughly explained, informed consent was obtained.  A digital rectal exam revealed no rectal mass.   The Pentax Ped Colon [id number]  endoscope was introduced through the anus and advanced to the cecum, which was identified by both the appendix and ileocecal valve. No adverse events experienced.   The quality of the prep was good, using MoviPrep The instrument was then slowly withdrawn as the colon was fully examined.   COLON FINDINGS: A normal appearing cecum, ileocecal valve, and appendiceal orifice were identified.  The ascending, hepatic flexure, transverse, splenic flexure, descending, sigmoid colon and rectum appeared unremarkable.  No bleeding lesion, angioectasias, polyps or cancers were seen.  Retroflexed views revealed very small internal hemorrhoids.      The scope was withdrawn and the procedure completed.  COMPLICATIONS: There were no complications.  ENDOSCOPIC IMPRESSION: Normal colon  RECOMMENDATIONS: 1.  Iron replacement therapy 2.  Monitor Hgb/HCT and iron stores.   If not improving with iron replacement, then next test would be video capsule endoscopy to imaging small bowel mucosa   eSigned:  Jerene Bears, MD 06/22/2014 10:15 AM   cc: The Patient

## 2014-06-23 DIAGNOSIS — K2940 Chronic atrophic gastritis without bleeding: Secondary | ICD-10-CM | POA: Diagnosis not present

## 2014-06-23 LAB — CBC
HCT: 31.4 % — ABNORMAL LOW (ref 36.0–46.0)
HEMOGLOBIN: 9.6 g/dL — AB (ref 12.0–15.0)
MCH: 27.7 pg (ref 26.0–34.0)
MCHC: 30.6 g/dL (ref 30.0–36.0)
MCV: 90.8 fL (ref 78.0–100.0)
PLATELETS: 269 10*3/uL (ref 150–400)
RBC: 3.46 MIL/uL — AB (ref 3.87–5.11)
RDW: 14.3 % (ref 11.5–15.5)
WBC: 6.1 10*3/uL (ref 4.0–10.5)

## 2014-06-23 NOTE — Progress Notes (Signed)
[facility] PROGRESS NOTE  Valerie Downs GYI:[id number] DOB: 10-31-1930 DOA: 06/21/2014 PCP: [facility], MD  Assessment/Plan: Principal Problem:   Symptomatic anemia -GI workup currently. Patient was unable to get proper GI workup secondary to compliance with Prep prior to colonoscopy. - Pt does not want to take prep, will reassess cbc next am and if patient steady and no active bleeding will plan on discharging next am with f/u with GI should patient desire further work up.  Active Problems:   HYPERTENSION, BENIGN SYSTEMIC   Iron deficiency anemia - Continue iron supplementation unless otherwise indicated by GI given plans for colonoscopy    Chronic systolic CHF (congestive heart failure) - Currently compensated continue ARB and beta blocker    GI bleed - Continue to monitor blood levels    Heme positive stool - Please see plan as listed above  Code Status: Full Family Communication: Discussed with patient and family at bedside Disposition Plan: Pending further workup   Consultants:  Gastroenterologist: Dr. Hilarie Fredrickson  Procedures:  Colonoscopy attempted  Antibiotics:  None  HPI/Subjective: The patient has no new complaints  Objective: Filed Vitals:   06/23/14 1321  BP: 185/80  Pulse: 66  Temp: 98.2 F (36.8 C)  Resp: 16    Intake/Output Summary (Last 24 hours) at 06/23/14 1727 Last data filed at 06/23/14 1714  Gross per 24 hour  Intake 2816.25 ml  Output   3050 ml  Net -233.75 ml   Filed Weights   06/21/14 1115  Weight: 63.957 kg (141 lb)    Exam:   General:  Pt in nad, alert and awake  Cardiovascular: no cyanosis at extremities  Respiratory: cta bl, no wheezes  Abdomen: Nd, NT  Musculoskeletal: no cyanosis or clubbing   Data Reviewed: Basic Metabolic Panel:  Recent Labs Lab 06/21/14 0814 06/22/14 0532  NA 141 141  K 4.7 5.3  CL 105 109  CO2 23 22  GLUCOSE 121* 87  BUN 21 16  CREATININE 1.14* 1.02  CALCIUM 9.5 9.6    Liver Function Tests:  Recent Labs Lab 06/22/14 0532  AST 13  ALT 5  ALKPHOS 95  BILITOT 0.4  PROT 7.0  ALBUMIN 3.2*   No results found for this basename: LIPASE, AMYLASE,  in the last 168 hours No results found for this basename: AMMONIA,  in the last 168 hours CBC:  Recent Labs Lab 06/21/14 0814 06/22/14 0532 06/23/14 1404  WBC 5.6 5.4 6.1  NEUTROABS 2.6  --   --   HGB 8.1* 8.9* 9.6*  HCT 26.5* 28.8* 31.4*  MCV 91.4 90.6 90.8  PLT 310 271 269   Cardiac Enzymes: No results found for this basename: CKTOTAL, CKMB, CKMBINDEX, TROPONINI,  in the last 168 hours BNP (last 3 results)  Recent Labs  07/26/13 1745 07/26/13 2245  PROBNP 13354.0* 13350.0*   CBG: No results found for this basename: GLUCAP,  in the last 168 hours  No results found for this or any previous visit (from the past 240 hour(s)).   Studies: No results found.  Scheduled Meds: . ferrous sulfate  325 mg Oral BID WC  . furosemide  20 mg Oral Daily  . losartan  25 mg Oral Daily  . metoprolol tartrate  25 mg Oral BID  . potassium chloride SA  10 mEq Oral BID   Continuous Infusions: . sodium chloride    . sodium chloride 75 mL/hr (06/23/14 0837)     Time spent: > 35 minutes    [name],  [facility]  [facility] Pager [phone] If 7PM-7AM, please contact night-coverage at [email or web address], password [email or web address] 06/23/2014, 5:27 PM  LOS: 2 days

## 2014-06-24 ENCOUNTER — Encounter (HOSPITAL_COMMUNITY): Payer: Self-pay | Admitting: Internal Medicine

## 2014-06-24 DIAGNOSIS — K2940 Chronic atrophic gastritis without bleeding: Secondary | ICD-10-CM | POA: Diagnosis not present

## 2014-06-24 LAB — CBC
HEMATOCRIT: 30.9 % — AB (ref 36.0–46.0)
HEMOGLOBIN: 9.7 g/dL — AB (ref 12.0–15.0)
MCH: 28 pg (ref 26.0–34.0)
MCHC: 31.4 g/dL (ref 30.0–36.0)
MCV: 89.3 fL (ref 78.0–100.0)
Platelets: 267 10*3/uL (ref 150–400)
RBC: 3.46 MIL/uL — AB (ref 3.87–5.11)
RDW: 14.3 % (ref 11.5–15.5)
WBC: 5.7 10*3/uL (ref 4.0–10.5)

## 2014-06-24 MED ORDER — FERROUS SULFATE 325 (65 FE) MG PO TABS: 325.0000 mg | ORAL_TABLET | Freq: Three times a day (TID) | ORAL | Status: DC

## 2014-06-24 NOTE — Progress Notes (Signed)
Nurse reviewed discharge instructions with pt and family.  Pt verbalized understanding of discharge instructions, follow up appointment and new medications. No concerns at time of discharge.

## 2014-06-24 NOTE — Discharge Summary (Signed)
Physician Discharge Summary  Valerie Downs SWH:[id number] DOB: 02/11/1930 DOA: 06/21/2014  PCP: [facility], MD  Admit date: 06/21/2014 Discharge date: 06/24/2014  Time spent: > 35 minutes  Recommendations for Outpatient Follow-up:  1. Placed on iron supplementation on discharge 2. Consider further work up for anemia. Scheduling appointment with Dr. Hilarie Fredrickson on d/c for further plans for evaluation. Pt did not have good prep in house and as such was not carried out.   Discharge Diagnoses:  Principal Problem:   Symptomatic anemia Active Problems:   HYPERTENSION, BENIGN SYSTEMIC   Iron deficiency anemia   Chronic systolic CHF (congestive heart failure)   GI bleed   Heme positive stool   Discharge Condition: stable  Diet recommendation: heart healthy  Filed Weights   06/21/14 1115  Weight: 63.957 kg (141 lb)    History of present illness:  80 y/o with hx of CHF, AS, MR, HTN who presented to the ED with abnormally low hgb from clinic.  Hospital Course:  Anemia - unfortunately pt refused 2nd bottle of prep for colonoscopy and procedure was not able to be effectively carried out. Will have secretary set up appointment as outpatient with GI for further evaluation and recommendations. - Place on Iron supplementation.  Procedures:  Colonoscopy attempted but unsuccessful  Consultations:  Gastroenterology  Discharge Exam: Filed Vitals:   06/24/14 0607  BP: 170/71  Pulse: 62  Temp: 98 F (36.7 C)  Resp: 18    General: Pt in NAD, alert and awake Cardiovascular: RRR, no MRG Respiratory: CTA BL, no wheezes Abdomen: soft, ND, NT  Discharge Instructions You were cared for by a hospitalist during your hospital stay. If you have any questions about your discharge medications or the care you received while you were in the hospital after you are discharged, you can call the unit and asked to speak with the hospitalist on call if the hospitalist that took care of you is not  available. Once you are discharged, your primary care physician will handle any further medical issues. Please note that NO REFILLS for any discharge medications will be authorized once you are discharged, as it is imperative that you return to your primary care physician (or establish a relationship with a primary care physician if you do not have one) for your aftercare needs so that they can reassess your need for medications and monitor your lab values.  Discharge Instructions   Call MD for:  severe uncontrolled pain    Complete by:  As directed      Call MD for:  temperature >100.4    Complete by:  As directed      Diet - low sodium heart healthy    Complete by:  As directed      Increase activity slowly    Complete by:  As directed             Medication List         ferrous sulfate 325 (65 FE) MG tablet  Take 1 tablet (325 mg total) by mouth 3 (three) times daily with meals.     furosemide 20 MG tablet  Commonly known as:  LASIX  Take 20 mg by mouth daily.     losartan 25 MG tablet  Commonly known as:  COZAAR  Take 1 tablet (25 mg total) by mouth daily.     metoprolol tartrate 25 MG tablet  Commonly known as:  LOPRESSOR  Take 25 mg by mouth 2 (two) times daily.  potassium chloride SA 20 MEQ tablet  Commonly known as:  K-DUR,KLOR-CON  Take 10 mEq by mouth 2 (two) times daily.       Allergies  Allergen Reactions  . Penicillins Anaphylaxis and Swelling      The results of significant diagnostics from this hospitalization (including imaging, microbiology, ancillary and laboratory) are listed below for reference.    Significant Diagnostic Studies: No results found.  Microbiology: No results found for this or any previous visit (from the past 240 hour(s)).   Labs: Basic Metabolic Panel:  Recent Labs Lab 06/21/14 0814 06/22/14 0532  NA 141 141  K 4.7 5.3  CL 105 109  CO2 23 22  GLUCOSE 121* 87  BUN 21 16  CREATININE 1.14* 1.02  CALCIUM 9.5 9.6    Liver Function Tests:  Recent Labs Lab 06/22/14 0532  AST 13  ALT 5  ALKPHOS 95  BILITOT 0.4  PROT 7.0  ALBUMIN 3.2*   No results found for this basename: LIPASE, AMYLASE,  in the last 168 hours No results found for this basename: AMMONIA,  in the last 168 hours CBC:  Recent Labs Lab 06/21/14 0814 06/22/14 0532 06/23/14 1404 06/24/14 0537  WBC 5.6 5.4 6.1 5.7  NEUTROABS 2.6  --   --   --   HGB 8.1* 8.9* 9.6* 9.7*  HCT 26.5* 28.8* 31.4* 30.9*  MCV 91.4 90.6 90.8 89.3  PLT 310 271 269 267   Cardiac Enzymes: No results found for this basename: CKTOTAL, CKMB, CKMBINDEX, TROPONINI,  in the last 168 hours BNP: BNP (last 3 results)  Recent Labs  07/26/13 1745 07/26/13 2245  PROBNP 13354.0* 13350.0*   CBG: No results found for this basename: GLUCAP,  in the last 168 hours     Signed:  Velvet Bathe  [facility] 06/24/2014, 12:32 PM

## 2014-06-25 LAB — TYPE AND SCREEN
ABO/RH(D): O POS
Antibody Screen: NEGATIVE
UNIT DIVISION: 0
Unit division: 0

## 2014-07-01 ENCOUNTER — Encounter: Payer: Self-pay | Admitting: Internal Medicine

## 2014-07-02 ENCOUNTER — Telehealth: Payer: Self-pay

## 2014-07-02 ENCOUNTER — Other Ambulatory Visit: Payer: Self-pay

## 2014-07-02 MED ORDER — BIS SUBCIT-METRONID-TETRACYC 140-125-125 MG PO CAPS: 3.0000 | ORAL_CAPSULE | Freq: Three times a day (TID) | ORAL | Status: DC

## 2014-07-02 MED ORDER — OMEPRAZOLE 20 MG PO CPDR: 20.0000 mg | DELAYED_RELEASE_CAPSULE | Freq: Every day | ORAL | Status: DC

## 2014-07-03 NOTE — Telephone Encounter (Signed)
Message copied by Greggory Keen on Wed Jul 03, 2014  9:16 AM ------      Message from: Jerene Bears      Created: Tue Jul 02, 2014  2:37 PM       PPI is a component of pylera, so if this was prescribed, then additional ppi not necessary,       Otherwise omeprazole 20 mg BID      JMP            ----- Message -----         From: Virgina Evener, LPN         Sent: 09/02/5882  10:09 AM           To: Jerene Bears, MD            What PPI do you want to use      ----- Message -----         From: Jerene Bears, MD         Sent: 07/01/2014   2:20 PM           To: Kellie Moor, RN            H. pylori positive gastritis      Please treat with Pylera and twice-daily PPI      Have her let us know if course of abx cannot be obtained or completed             ------

## 2014-07-03 NOTE — Telephone Encounter (Signed)
Left message to call.

## 2014-07-08 NOTE — Telephone Encounter (Signed)
Left message to call.

## 2014-08-07 ENCOUNTER — Ambulatory Visit (HOSPITAL_COMMUNITY): Payer: Medicare Other | Attending: Cardiovascular Disease

## 2014-08-07 DIAGNOSIS — I379 Nonrheumatic pulmonary valve disorder, unspecified: Secondary | ICD-10-CM | POA: Diagnosis not present

## 2014-08-07 DIAGNOSIS — I509 Heart failure, unspecified: Secondary | ICD-10-CM | POA: Insufficient documentation

## 2014-08-07 DIAGNOSIS — I517 Cardiomegaly: Secondary | ICD-10-CM | POA: Diagnosis not present

## 2014-08-07 DIAGNOSIS — Z87891 Personal history of nicotine dependence: Secondary | ICD-10-CM | POA: Insufficient documentation

## 2014-08-07 DIAGNOSIS — I351 Nonrheumatic aortic (valve) insufficiency: Secondary | ICD-10-CM

## 2014-08-07 DIAGNOSIS — I059 Rheumatic mitral valve disease, unspecified: Secondary | ICD-10-CM | POA: Insufficient documentation

## 2014-08-07 DIAGNOSIS — N390 Urinary tract infection, site not specified: Secondary | ICD-10-CM | POA: Insufficient documentation

## 2014-08-07 DIAGNOSIS — I5022 Chronic systolic (congestive) heart failure: Secondary | ICD-10-CM

## 2014-08-07 DIAGNOSIS — I10 Essential (primary) hypertension: Secondary | ICD-10-CM | POA: Diagnosis not present

## 2014-08-07 DIAGNOSIS — E46 Unspecified protein-calorie malnutrition: Secondary | ICD-10-CM | POA: Insufficient documentation

## 2014-08-07 DIAGNOSIS — I359 Nonrheumatic aortic valve disorder, unspecified: Secondary | ICD-10-CM | POA: Insufficient documentation

## 2014-08-07 DIAGNOSIS — D649 Anemia, unspecified: Secondary | ICD-10-CM | POA: Insufficient documentation

## 2014-08-07 DIAGNOSIS — K922 Gastrointestinal hemorrhage, unspecified: Secondary | ICD-10-CM | POA: Insufficient documentation

## 2014-08-07 DIAGNOSIS — I428 Other cardiomyopathies: Secondary | ICD-10-CM

## 2014-08-07 DIAGNOSIS — I340 Nonrheumatic mitral (valve) insufficiency: Secondary | ICD-10-CM

## 2014-08-07 DIAGNOSIS — I079 Rheumatic tricuspid valve disease, unspecified: Secondary | ICD-10-CM | POA: Diagnosis not present

## 2014-08-07 NOTE — Progress Notes (Signed)
2D Echo completed. 08/07/2014 

## 2014-08-15 ENCOUNTER — Encounter (HOSPITAL_COMMUNITY): Payer: Self-pay | Admitting: Emergency Medicine

## 2014-08-15 ENCOUNTER — Observation Stay (HOSPITAL_COMMUNITY)
Admission: EM | Admit: 2014-08-15 | Discharge: 2014-08-17 | Disposition: A | Discharge status: Home / Self Care | Payer: Medicare Other | Attending: Internal Medicine | Admitting: Internal Medicine

## 2014-08-15 DIAGNOSIS — E440 Moderate protein-calorie malnutrition: Secondary | ICD-10-CM

## 2014-08-15 DIAGNOSIS — D500 Iron deficiency anemia secondary to blood loss (chronic): Secondary | ICD-10-CM | POA: Diagnosis not present

## 2014-08-15 DIAGNOSIS — Z79899 Other long term (current) drug therapy: Secondary | ICD-10-CM | POA: Insufficient documentation

## 2014-08-15 DIAGNOSIS — Z9010 Acquired absence of unspecified breast and nipple: Secondary | ICD-10-CM | POA: Diagnosis not present

## 2014-08-15 DIAGNOSIS — D649 Anemia, unspecified: Secondary | ICD-10-CM

## 2014-08-15 DIAGNOSIS — Z880 Allergy status to penicillin: Secondary | ICD-10-CM | POA: Insufficient documentation

## 2014-08-15 DIAGNOSIS — I5021 Acute systolic (congestive) heart failure: Secondary | ICD-10-CM

## 2014-08-15 DIAGNOSIS — I428 Other cardiomyopathies: Secondary | ICD-10-CM | POA: Insufficient documentation

## 2014-08-15 DIAGNOSIS — I509 Heart failure, unspecified: Secondary | ICD-10-CM

## 2014-08-15 DIAGNOSIS — D509 Iron deficiency anemia, unspecified: Secondary | ICD-10-CM

## 2014-08-15 DIAGNOSIS — Z853 Personal history of malignant neoplasm of breast: Secondary | ICD-10-CM | POA: Diagnosis not present

## 2014-08-15 DIAGNOSIS — D62 Acute posthemorrhagic anemia: Secondary | ICD-10-CM | POA: Diagnosis not present

## 2014-08-15 DIAGNOSIS — I080 Rheumatic disorders of both mitral and aortic valves: Secondary | ICD-10-CM | POA: Diagnosis not present

## 2014-08-15 DIAGNOSIS — K625 Hemorrhage of anus and rectum: Secondary | ICD-10-CM

## 2014-08-15 DIAGNOSIS — R195 Other fecal abnormalities: Secondary | ICD-10-CM

## 2014-08-15 DIAGNOSIS — N390 Urinary tract infection, site not specified: Secondary | ICD-10-CM

## 2014-08-15 DIAGNOSIS — I5022 Chronic systolic (congestive) heart failure: Secondary | ICD-10-CM

## 2014-08-15 DIAGNOSIS — N179 Acute kidney failure, unspecified: Secondary | ICD-10-CM

## 2014-08-15 DIAGNOSIS — K922 Gastrointestinal hemorrhage, unspecified: Secondary | ICD-10-CM | POA: Diagnosis present

## 2014-08-15 DIAGNOSIS — I10 Essential (primary) hypertension: Secondary | ICD-10-CM | POA: Diagnosis not present

## 2014-08-15 LAB — COMPREHENSIVE METABOLIC PANEL
ALT: 11 U/L (ref 0–35)
AST: 24 U/L (ref 0–37)
Albumin: 3.6 g/dL (ref 3.5–5.2)
Alkaline Phosphatase: 116 U/L (ref 39–117)
Anion gap: 14 (ref 5–15)
BUN: 25 mg/dL — ABNORMAL HIGH (ref 6–23)
CALCIUM: 10.1 mg/dL (ref 8.4–10.5)
CO2: 21 meq/L (ref 19–32)
CREATININE: 1.36 mg/dL — AB (ref 0.50–1.10)
Chloride: 105 mEq/L (ref 96–112)
GFR calc Af Amer: 41 mL/min — ABNORMAL LOW (ref >90)
GFR, EST NON AFRICAN AMERICAN: 35 mL/min — AB (ref >90)
Glucose, Bld: 104 mg/dL — ABNORMAL HIGH (ref 70–99)
Potassium: 4.7 mEq/L (ref 3.7–5.3)
SODIUM: 140 meq/L (ref 137–147)
Total Bilirubin: <0.2 mg/dL — ABNORMAL LOW (ref 0.3–1.2)
Total Protein: 8.2 g/dL (ref 6.0–8.3)

## 2014-08-15 LAB — CBC
HCT: 26.8 % — ABNORMAL LOW (ref 36.0–46.0)
Hemoglobin: 8.6 g/dL — ABNORMAL LOW (ref 12.0–15.0)
MCH: 29.6 pg (ref 26.0–34.0)
MCHC: 32.1 g/dL (ref 30.0–36.0)
MCV: 92.1 fL (ref 78.0–100.0)
PLATELETS: 293 10*3/uL (ref 150–400)
RBC: 2.91 MIL/uL — AB (ref 3.87–5.11)
RDW: 16.2 % — AB (ref 11.5–15.5)
WBC: 5.3 10*3/uL (ref 4.0–10.5)

## 2014-08-15 LAB — PROTIME-INR
INR: 1.07 (ref 0.00–1.49)
PROTHROMBIN TIME: 13.9 s (ref 11.6–15.2)

## 2014-08-15 LAB — TYPE AND SCREEN
ABO/RH(D): O POS
ANTIBODY SCREEN: NEGATIVE

## 2014-08-15 LAB — POC OCCULT BLOOD, ED: FECAL OCCULT BLD: POSITIVE — AB

## 2014-08-15 LAB — TSH: TSH: 0.51 u[IU]/mL (ref 0.350–4.500)

## 2014-08-15 LAB — APTT: APTT: 31 s (ref 24–37)

## 2014-08-15 MED ORDER — OXYCODONE HCL 5 MG PO TABS: 5.0000 mg | ORAL_TABLET | ORAL | Status: DC | PRN

## 2014-08-15 MED ORDER — ONDANSETRON HCL 4 MG PO TABS: 4.0000 mg | ORAL_TABLET | Freq: Four times a day (QID) | ORAL | Status: DC | PRN

## 2014-08-15 MED ORDER — POTASSIUM CHLORIDE CRYS ER 10 MEQ PO TBCR
10.0000 meq | EXTENDED_RELEASE_TABLET | Freq: Two times a day (BID) | ORAL | Status: DC
  Administered 2014-08-15 – 2014-08-17 (×4): 10 meq via ORAL
  Filled 2014-08-15 (×5): qty 1

## 2014-08-15 MED ORDER — ONDANSETRON HCL 4 MG/2ML IJ SOLN: 4.0000 mg | Freq: Four times a day (QID) | INTRAMUSCULAR | Status: DC | PRN

## 2014-08-15 MED ORDER — FERROUS SULFATE 325 (65 FE) MG PO TABS
325.0000 mg | ORAL_TABLET | Freq: Three times a day (TID) | ORAL | Status: DC
  Administered 2014-08-16 – 2014-08-17 (×4): 325 mg via ORAL
  Filled 2014-08-15 (×8): qty 1

## 2014-08-15 MED ORDER — ENSURE COMPLETE PO LIQD
237.0000 mL | Freq: Every day | ORAL | Status: DC
  Administered 2014-08-16 – 2014-08-17 (×2): 237 mL via ORAL

## 2014-08-15 MED ORDER — SODIUM CHLORIDE 0.9 % IV SOLN
INTRAVENOUS | Status: AC
  Administered 2014-08-15: 20:00:00 via INTRAVENOUS

## 2014-08-15 MED ORDER — METOPROLOL TARTRATE 25 MG PO TABS
25.0000 mg | ORAL_TABLET | Freq: Two times a day (BID) | ORAL | Status: DC
  Administered 2014-08-15 – 2014-08-17 (×4): 25 mg via ORAL
  Filled 2014-08-15 (×5): qty 1

## 2014-08-15 NOTE — H&P (Addendum)
[facility] History and Physical  Valerie Downs RFF:[id number] DOB: 01-14-1930 DOA: 08/15/2014  Referring physician: ED physician PCP: York Ram, MD  Chief Complaint:   HPI:  Pt is 80 yo female with a history of breast cancer, cardiomyopathy, HTN, CHF and anemia who presented to [facility] ED after receiving a call from PCP about the abnormal blood test result. Patient and family do not know exactly which test and what the value was but they thinks it was about blood loss and potential need for transfusion. Pt was hospitalized in June 2015 for acute blood loss anemia  But colonoscopy was not done as pt was unable to tolerate prep. Pt currently denies chest pain or shortness of breath, no specific abd or urinary concerns. She denies noticing blood in stool or urine.   Pt noted to be hemodynamically stable in ED, vitals stable, Hg appears to be slightly down since last month. FOBT positive. [facility] asked to admit for observation and evaluation.   Her PCP is York Ram in [place] (Phone: [phone]).   Assessment and Plan: Active Problems: Acute on chronic blood loss anemia - admit to medical unit - review of records indicate that endoscopy and colonoscopy done in June 2015 - colonoscopy was normal but apparently not completed as pt not able to complete prep - EGD notable for gastritis and recommendation was to continue PPI - will monitor CBC and will consider GI consult in AM if further drop in Hg noted  - continue iron supplementation  Acute renal failure - hold Losartan and Lasix until renal function stabilizes  - provide IVF and repeat BMP in AM HTN - blood pressure stable on admission - hold Lasix and Losartan due to ARF - may be able to resume home medical regimen in AM - will continue Metoprolol as per home medical regimen   Radiological Exams on Admission: No results found.  Code Status: Full Family Communication: Pt at bedside Disposition Plan: Admit for further  evaluation    Review of Systems:  Constitutional: Negative for fever, chills and malaise/fatigue. Negative for diaphoresis.  HENT: Negative for hearing loss, ear pain, nosebleeds, , tinnitus and ear discharge.   Eyes: Negative for blurred vision, double vision, photophobia, pain, discharge and redness.  Respiratory: Negative for cough, hemoptysis, sputum production, shortness of breath, wheezing and stridor.   Cardiovascular: Negative for chest pain, palpitations, orthopnea, claudication and leg swelling.  Gastrointestinal: Negative for nausea, vomiting and abdominal pain.  Genitourinary: Negative for dysuria, urgency, frequency, hematuria and flank pain.  Musculoskeletal: Negative for myalgias, back pain, joint pain and falls.  Skin: Negative for itching and rash.  Neurological: Negative for dizziness and weakness.  Endo/Heme/Allergies: Negative for environmental allergies and polydipsia. Does not bruise/bleed easily.  Psychiatric/Behavioral: Negative for suicidal ideas. The patient is not nervous/anxious.      Past Medical History  Diagnosis Date  . Breast cancer   . Cardiomyopathy   . Aortic insufficiency   . Mitral regurgitation   . Hypertension   . CHF (congestive heart failure)     Past Surgical History  Procedure Laterality Date  . Mastectomy Left   . Colonoscopy N/A 06/22/2014    Procedure: COLONOSCOPY;  Surgeon: Jerene Bears, MD;  Location: [facility];  Service: Endoscopy;  Laterality: N/A;  . Esophagogastroduodenoscopy N/A 06/22/2014    Procedure: ESOPHAGOGASTRODUODENOSCOPY (EGD);  Surgeon: Jerene Bears, MD;  Location: [facility];  Service: Endoscopy;  Laterality: N/A;    Social History:  reports that she quit smoking  about 2 years ago. Her smoking use included Cigarettes. She smoked 0.00 packs per day for 50 years. Her smokeless tobacco use includes Snuff. She reports that she does not drink alcohol or use illicit drugs.  Allergies  Allergen Reactions  .  Penicillins Anaphylaxis and Swelling    Family History  Problem Relation Age of Onset  . Hypertension Mother     Prior to Admission medications   Medication Sig Start Date End Date Taking? Authorizing Provider  feeding supplement, ENSURE COMPLETE, (ENSURE COMPLETE) LIQD Take 237 mLs by mouth daily.   Yes Historical Provider, MD  ferrous sulfate 325 (65 FE) MG tablet Take 325 mg by mouth 3 (three) times daily with meals.    Yes Historical Provider, MD  furosemide (LASIX) 20 MG tablet Take 20 mg by mouth daily.   Yes Historical Provider, MD  losartan (COZAAR) 25 MG tablet Take 25 mg by mouth daily.   Yes Historical Provider, MD  metoprolol tartrate (LOPRESSOR) 25 MG tablet Take 25 mg by mouth 2 (two) times daily.    Yes Historical Provider, MD  potassium chloride SA (K-DUR,KLOR-CON) 20 MEQ tablet Take 10 mEq by mouth 2 (two) times daily.   Yes Historical Provider, MD    Physical Exam: Filed Vitals:   08/15/14 1425 08/15/14 1725 08/15/14 1928 08/15/14 1939  BP: 133/55 137/63 129/74 144/70  Pulse: 91 73 79 79  Temp: 98.2 F (36.8 C)   97.7 F (36.5 C)  TempSrc: Oral   Axillary  Resp: 20 16 16 19   Height:    5\' 1"  (1.549 m)  Weight:    62.596 kg (138 lb)  SpO2: 98% 99% 97% 100%    Physical Exam  Constitutional: Appears well-developed and well-nourished. No distress.  HENT: Normocephalic. External right and left ear normal. Oropharynx is clear and moist.  Eyes: Conjunctivae and EOM are normal. PERRLA, no scleral icterus.  Neck: Normal ROM. Neck supple. No JVD. No tracheal deviation. No thyromegaly.  CVS: RRR, S1/S2 +, no murmurs, no gallops, no carotid bruit.  Pulmonary: Effort and breath sounds normal, no stridor, rhonchi, wheezes, rales.  Abdominal: Soft. BS +,  no distension, tenderness, rebound or guarding.  Musculoskeletal: Normal range of motion. No edema and no tenderness.  Lymphadenopathy: No lymphadenopathy noted, cervical, inguinal. Neuro: Alert. Normal reflexes, muscle  tone coordination. No cranial nerve deficit. Skin: Skin is warm and dry. No rash noted. Not diaphoretic. No erythema. No pallor.  Psychiatric: Normal mood and affect.   Labs on Admission:  Basic Metabolic Panel:  Recent Labs Lab 08/15/14 1535  NA 140  K 4.7  CL 105  CO2 21  GLUCOSE 104*  BUN 25*  CREATININE 1.36*  CALCIUM 10.1   Liver Function Tests:  Recent Labs Lab 08/15/14 1535  AST 24  ALT 11  ALKPHOS 116  BILITOT <0.2*  PROT 8.2  ALBUMIN 3.6   CBC:  Recent Labs Lab 08/15/14 1535  WBC 5.3  HGB 8.6*  HCT 26.8*  MCV 92.1  PLT 293    EKG: Normal sinus rhythm, no ST/T wave changes  Faye Ramsay, MD  [facility] Pager [phone]  If 7PM-7AM, please contact night-coverage [email or web address] Password [facility] 08/15/2014, 8:42 PM

## 2014-08-15 NOTE — ED Notes (Signed)
Attempted to call report to the floor, no answer.

## 2014-08-15 NOTE — ED Provider Notes (Signed)
CSN: [id number]     Arrival date & time 08/15/14  1403 History   First MD Initiated Contact with Patient 08/15/14 1531     Chief Complaint  Patient presents with  . Abnormal Lab   HPI  Ms. Godown is a 80 yo female with a history of breast cancer, cardiomyopathy, HTN, CHF and anemia who presents today to the ED after getting a call from her PCP about an abnormal lab results.   Patient and family do not know exactly which test and what value the lab was, but they do report that the test was performed around August 1, almost three weeks ago. Of note, Ms. Wissinger was hospitalized between 6/26-6/29 for symptomatic anemia, likely 2/2 to lower GI bleed. A colonoscopy was attempted in house, but was not completed since the patient could not finish the oral prep. Patient was sent for outpatient colonscopy, though she has not yet received one.  Since her hospitalization, patient reports no new symptoms. She says that she has been feeling herself again, denying any malaise, exertional chest pain, dyspnea, fevers, chills, nausea, vomiting, constipation, dysuria or diarrhea. She also has not noticed any blood in her stool or urine.   Her PCP is York Ram in [place] (Phone: [phone]), though he could not be reached at the time of exam.  Past Medical History  Diagnosis Date  . Breast cancer   . Cardiomyopathy   . Aortic insufficiency   . Mitral regurgitation   . Hypertension   . CHF (congestive heart failure)    Past Surgical History  Procedure Laterality Date  . Mastectomy Left   . Colonoscopy N/A 06/22/2014    Procedure: COLONOSCOPY;  Surgeon: Jerene Bears, MD;  Location: [facility];  Service: Endoscopy;  Laterality: N/A;  . Esophagogastroduodenoscopy N/A 06/22/2014    Procedure: ESOPHAGOGASTRODUODENOSCOPY (EGD);  Surgeon: Jerene Bears, MD;  Location: [facility];  Service: Endoscopy;  Laterality: N/A;   Family History  Problem Relation Age of Onset  . Hypertension Mother    History   Substance Use Topics  . Smoking status: Former Smoker -- 50 years    Types: Cigarettes    Quit date: 06/25/2012  . Smokeless tobacco: Current User    Types: Snuff  . Alcohol Use: No   OB History   Grav Para Term Preterm Abortions TAB SAB Ect Mult Living                 Review of Systems  Constitutional: Negative for fever and chills.  Respiratory: Negative for cough, shortness of breath and wheezing.   Cardiovascular: Negative for chest pain.  Gastrointestinal: Negative for nausea, vomiting, diarrhea and constipation.  Genitourinary: Negative for dysuria and hematuria.  Neurological: Negative for light-headedness.      Allergies  Penicillins  Home Medications   Prior to Admission medications   Medication Sig Start Date End Date Taking? Authorizing Provider  feeding supplement, ENSURE COMPLETE, (ENSURE COMPLETE) LIQD Take 237 mLs by mouth daily.   Yes Historical Provider, MD  ferrous sulfate 325 (65 FE) MG tablet Take 325 mg by mouth 3 (three) times daily with meals.    Yes Historical Provider, MD  furosemide (LASIX) 20 MG tablet Take 20 mg by mouth daily.   Yes Historical Provider, MD  losartan (COZAAR) 25 MG tablet Take 25 mg by mouth daily.   Yes Historical Provider, MD  metoprolol tartrate (LOPRESSOR) 25 MG tablet Take 25 mg by mouth 2 (two) times daily.    Yes  Historical Provider, MD  potassium chloride SA (K-DUR,KLOR-CON) 20 MEQ tablet Take 10 mEq by mouth 2 (two) times daily.   Yes Historical Provider, MD   BP 144/70  Pulse 79  Temp(Src) 97.7 F (36.5 C) (Axillary)  Resp 19  Ht 5\' 1"  (1.549 m)  Wt 138 lb (62.596 kg)  BMI 26.09 kg/m2  SpO2 100% Physical Exam  Constitutional: She is oriented to person, place, and time. She appears well-developed and well-nourished. No distress.  HENT:  Head: Normocephalic and atraumatic.  Eyes:  Conjunctiva pale  Cardiovascular: Normal rate and regular rhythm.  Exam reveals no gallop and no friction rub.   No murmur  heard. Pulmonary/Chest: Effort normal and breath sounds normal. No respiratory distress. She has no wheezes. She has no rales.  Abdominal: Soft. Bowel sounds are normal. She exhibits no distension. There is no tenderness.  Rectal exam without any signs of external bleeding. No gross blood in the rectal vault, though hemaoccult positive.  Musculoskeletal: She exhibits no edema and no tenderness.  Neurological: She is alert and oriented to person, place, and time.    ED Course  Procedures (including critical care time) Labs Review Labs Reviewed  CBC - Abnormal; Notable for the following:    RBC 2.91 (*)    Hemoglobin 8.6 (*)    HCT 26.8 (*)    RDW 16.2 (*)    All other components within normal limits  COMPREHENSIVE METABOLIC PANEL - Abnormal; Notable for the following:    Glucose, Bld 104 (*)    BUN 25 (*)    Creatinine, Ser 1.36 (*)    Total Bilirubin <0.2 (*)    GFR calc non Af Amer 35 (*)    GFR calc Af Amer 41 (*)    All other components within normal limits  POC OCCULT BLOOD, ED - Abnormal; Notable for the following:    Fecal Occult Bld POSITIVE (*)    All other components within normal limits  URINE CULTURE  APTT  PROTIME-INR  OCCULT BLOOD X 1 CARD TO LAB, STOOL  BASIC METABOLIC PANEL  CBC  TSH  URINALYSIS, ROUTINE W REFLEX MICROSCOPIC  TYPE AND SCREEN    Imaging Review No results found.   EKG Interpretation None      MDM   Final diagnoses:  Heme positive stool    Patient is a 80 yo female who presents asymptomatically after a presumably aberrant lab value from PCP clinic, potentially dated to nearly three weeks ago. Patient has a recent history of symptomatic and anemia and found to be hemaoccult positive but has not yet received a colonoscopy for evaluation. Hemaoccult positive, though no signs of active bleeding and vitals wnl. Transfusion not indicated at this time. Chronology of bleeding is unclear.  - CBC, CMP, coags  4:52 PM  - Patient with Hg 8.6,  down from 9.7 on 06/24/2014.   - Consult to hospitalist team, considering the ambiguity concerning the chronology of patient's lower GI bleeding.   - Hospitalist team will admit.    Luan Moore, MD 08/15/14 2043

## 2014-08-15 NOTE — ED Provider Notes (Signed)
Patient was sent to the emergency room because of an abnormal lab value. Patient states she saw her doctor earlier this month. However she was just called today and was told that her blood count was low. Patient states she's actually been feeling pretty well. She's not had any trouble with abdominal pain. No chest pain or shortness of breath. She has noticed that her stools have been very dark. She is taking an iron supplement. She has not noticed any bright red blood in her stools Physical Exam  BP 133/55  Pulse 91  Temp(Src) 98.2 F (36.8 C) (Oral)  Resp 20  SpO2 98%  Physical Exam  Nursing note and vitals reviewed. Constitutional: She appears well-developed and well-nourished. No distress.  HENT:  Head: Normocephalic and atraumatic.  Right Ear: External ear normal.  Left Ear: External ear normal.  Eyes: Conjunctivae are normal. Right eye exhibits no discharge. Left eye exhibits no discharge. No scleral icterus.  Neck: Neck supple. No tracheal deviation present.  Cardiovascular: Normal rate.   Pulmonary/Chest: Effort normal. No stridor. No respiratory distress.  Musculoskeletal: She exhibits no edema.  Neurological: She is alert. Cranial nerve deficit: no gross deficits.  Skin: Skin is warm and dry. No rash noted.  Psychiatric: She has a normal mood and affect.    ED Course  Procedures  MDM Patient's hemoglobin has dropped one point since her last laboratory value in the system. She does have guaiac-positive stools. Considering her age and comorbidities we will consult the medical service regarding admission for observation and serial CBCs, and GI consultation.      Dorie Rank, MD 08/15/14 1640

## 2014-08-15 NOTE — ED Notes (Addendum)
Pt presents due to an abnormal lab value.  Pt reports blood work taken at the beginning of the month.  She was called today and told to come in for a blood transfusion.  Pt denies complaints and sts that she feels "better than normal."  Denies abnormal bleeding, weakness, and dizziness.  Hx of breast CA.  Previous transfusion in June 2015.

## 2014-08-15 NOTE — ED Notes (Signed)
Initial Contact - pt A+Ox4, presents with family with reportedly told to come to ER for blood transfusion.  Pt reports blood drawn at PCP office at the beginning of the month she was initially told was normal, but was called today and told "numbers were low".  Pt denies all complaints and reports "feeling better than normal".  Skin PWD.  MAEI, ambulatory with steady gait.  Speaking full/clear sentences.  NAD.

## 2014-08-16 DIAGNOSIS — D509 Iron deficiency anemia, unspecified: Secondary | ICD-10-CM

## 2014-08-16 DIAGNOSIS — I509 Heart failure, unspecified: Secondary | ICD-10-CM

## 2014-08-16 DIAGNOSIS — N179 Acute kidney failure, unspecified: Secondary | ICD-10-CM

## 2014-08-16 DIAGNOSIS — D62 Acute posthemorrhagic anemia: Secondary | ICD-10-CM | POA: Diagnosis not present

## 2014-08-16 DIAGNOSIS — I10 Essential (primary) hypertension: Secondary | ICD-10-CM

## 2014-08-16 DIAGNOSIS — I5022 Chronic systolic (congestive) heart failure: Secondary | ICD-10-CM

## 2014-08-16 DIAGNOSIS — R195 Other fecal abnormalities: Secondary | ICD-10-CM

## 2014-08-16 LAB — CBC
HEMATOCRIT: 24.3 % — AB (ref 36.0–46.0)
Hemoglobin: 7.8 g/dL — ABNORMAL LOW (ref 12.0–15.0)
MCH: 29.7 pg (ref 26.0–34.0)
MCHC: 32.1 g/dL (ref 30.0–36.0)
MCV: 92.4 fL (ref 78.0–100.0)
PLATELETS: 234 10*3/uL (ref 150–400)
RBC: 2.63 MIL/uL — AB (ref 3.87–5.11)
RDW: 16.2 % — ABNORMAL HIGH (ref 11.5–15.5)
WBC: 4.1 10*3/uL (ref 4.0–10.5)

## 2014-08-16 LAB — URINALYSIS, ROUTINE W REFLEX MICROSCOPIC
BILIRUBIN URINE: NEGATIVE
Glucose, UA: NEGATIVE mg/dL
HGB URINE DIPSTICK: NEGATIVE
Ketones, ur: NEGATIVE mg/dL
Nitrite: NEGATIVE
Protein, ur: NEGATIVE mg/dL
Specific Gravity, Urine: 1.012 (ref 1.005–1.030)
UROBILINOGEN UA: 0.2 mg/dL (ref 0.0–1.0)
pH: 5.5 (ref 5.0–8.0)

## 2014-08-16 LAB — BASIC METABOLIC PANEL
Anion gap: 10 (ref 5–15)
BUN: 22 mg/dL (ref 6–23)
CALCIUM: 9.7 mg/dL (ref 8.4–10.5)
CO2: 21 meq/L (ref 19–32)
Chloride: 109 mEq/L (ref 96–112)
Creatinine, Ser: 1.18 mg/dL — ABNORMAL HIGH (ref 0.50–1.10)
GFR calc Af Amer: 48 mL/min — ABNORMAL LOW (ref >90)
GFR, EST NON AFRICAN AMERICAN: 42 mL/min — AB (ref >90)
GLUCOSE: 104 mg/dL — AB (ref 70–99)
Potassium: 4.7 mEq/L (ref 3.7–5.3)
Sodium: 140 mEq/L (ref 137–147)

## 2014-08-16 LAB — URINE MICROSCOPIC-ADD ON

## 2014-08-16 MED ORDER — PANTOPRAZOLE SODIUM 40 MG PO TBEC
40.0000 mg | DELAYED_RELEASE_TABLET | Freq: Two times a day (BID) | ORAL | Status: DC
  Administered 2014-08-16 – 2014-08-17 (×3): 40 mg via ORAL
  Filled 2014-08-16 (×4): qty 1

## 2014-08-16 NOTE — Progress Notes (Signed)
   [facility] PROGRESS NOTE  Assessment/Plan: GI bleeding - FOBT +, Hbg with mild variation. RDW, high check anemia panel. - cont PPI, - EGD notable for gastritis. - colonoscopy was normal but apparently not completed as pt not able to complete prep  - pt want to leave today, I have explained to her it is risky to discharge her today, and I cannot discharge her.. Will monitor cbc in am.  AKI (acute kidney injury) - improved with IV fluids - hold ACE-I and diuretics..  Chronic systolic CHF (congestive heart failure) - hold ACE-I and lasix, currently euvolemic.  HYPERTENSION, BENIGN SYSTEMIC: - controlled.    Code Status: Full  Family Communication: Pt at bedside  Disposition Plan: Admit for further evaluation     Consultants:  none  Procedures:  CXR  Antibiotics:  None  HPI/Subjective: Hostile tone and behavior. I qoute "I am going home today with or with out your permission" No melena  Objective: Filed Vitals:   08/15/14 1725 08/15/14 1928 08/15/14 1939 08/16/14 0659  BP: 137/63 129/74 144/70 132/59  Pulse: 73 79 79 62  Temp:   97.7 F (36.5 C) 98.5 F (36.9 C)  TempSrc:   Axillary Oral  Resp: 16 16 19 16   Height:   5\' 1"  (1.549 m)   Weight:   62.596 kg (138 lb)   SpO2: 99% 97% 100% 100%   No intake or output data in the 24 hours ending 08/16/14 0842 Filed Weights   08/15/14 1939  Weight: 62.596 kg (138 lb)    Exam:  General: Alert, awake, oriented x3, refused.  Data Reviewed: Basic Metabolic Panel:  Recent Labs Lab 08/15/14 1535 08/16/14 0540  NA 140 140  K 4.7 4.7  CL 105 109  CO2 21 21  GLUCOSE 104* 104*  BUN 25* 22  CREATININE 1.36* 1.18*  CALCIUM 10.1 9.7   Liver Function Tests:  Recent Labs Lab 08/15/14 1535  AST 24  ALT 11  ALKPHOS 116  BILITOT <0.2*  PROT 8.2  ALBUMIN 3.6   No results found for this basename: LIPASE, AMYLASE,  in the last 168 hours No results found for this basename: AMMONIA,  in the  last 168 hours CBC:  Recent Labs Lab 08/15/14 1535 08/16/14 0540  WBC 5.3 4.1  HGB 8.6* 7.8*  HCT 26.8* 24.3*  MCV 92.1 92.4  PLT 293 234   Cardiac Enzymes: No results found for this basename: CKTOTAL, CKMB, CKMBINDEX, TROPONINI,  in the last 168 hours BNP (last 3 results) No results found for this basename: PROBNP,  in the last 8760 hours CBG: No results found for this basename: GLUCAP,  in the last 168 hours  No results found for this or any previous visit (from the past 240 hour(s)).   Studies: No results found.  Scheduled Meds: . feeding supplement (ENSURE COMPLETE)  237 mL Oral Daily  . ferrous sulfate  325 mg Oral TID WC  . metoprolol tartrate  25 mg Oral BID  . potassium chloride SA  10 mEq Oral BID   Continuous Infusions:    Charlynne Cousins  [facility] Pager [phone]. If 8PM-8AM, please contact night-coverage at [email or web address], password [facility] 08/16/2014, 8:42 AM  LOS: 1 day      **Disclaimer: This note may have been dictated with voice recognition software. Similar sounding words can inadvertently be transcribed and this note may contain transcription errors which may not have been corrected upon publication of note.**

## 2014-08-16 NOTE — Care Management Note (Signed)
    Page 1 of 1   08/16/2014     11:14:27 AM CARE MANAGEMENT NOTE 08/16/2014  Patient:  Valerie Downs, Valerie Downs   Account Number:  [id number]  Date Initiated:  08/16/2014  Documentation initiated by:  Sunday Spillers  Subjective/Objective Assessment:   80 yo female admitted with rectal bleed, anemia. PTA lived at home with daughter.     Action/Plan:   Home when stable   Anticipated DC Date:  08/17/2014   Anticipated DC Plan:  [place]  CM consult      Choice offered to / List presented to:             Status of service:  Completed, signed off Medicare Important Message given?  NA - LOS <3 / Initial given by admissions (If response is "NO", the following Medicare IM given date fields will be blank) Date Medicare IM given:   Medicare IM given by:   Date Additional Medicare IM given:   Additional Medicare IM given by:    Discharge Disposition:  HOME/SELF CARE  Per UR Regulation:  Reviewed for med. necessity/level of care/duration of stay  If discussed at [place] of Stay Meetings, dates discussed:    Comments:

## 2014-08-17 DIAGNOSIS — D62 Acute posthemorrhagic anemia: Secondary | ICD-10-CM | POA: Diagnosis not present

## 2014-08-17 LAB — CBC
HCT: 25.7 % — ABNORMAL LOW (ref 36.0–46.0)
Hemoglobin: 8.1 g/dL — ABNORMAL LOW (ref 12.0–15.0)
MCH: 29.3 pg (ref 26.0–34.0)
MCHC: 31.5 g/dL (ref 30.0–36.0)
MCV: 93.1 fL (ref 78.0–100.0)
PLATELETS: 254 10*3/uL (ref 150–400)
RBC: 2.76 MIL/uL — AB (ref 3.87–5.11)
RDW: 16.1 % — ABNORMAL HIGH (ref 11.5–15.5)
WBC: 4.2 10*3/uL (ref 4.0–10.5)

## 2014-08-17 LAB — URINE CULTURE: Colony Count: >=100000

## 2014-08-17 MED ORDER — PANTOPRAZOLE SODIUM 40 MG PO TBEC
40.0000 mg | DELAYED_RELEASE_TABLET | Freq: Two times a day (BID) | ORAL | Status: DC
Start: 2014-08-17 — End: 2016-06-09

## 2014-08-17 NOTE — Progress Notes (Signed)
Pt discharged to home. Left unit in wheelchair pushed by nurse tech. Left in good condition. Pt's dtr came in to take pt to home. Vwilliams,rn.

## 2014-08-17 NOTE — Discharge Summary (Signed)
Physician Discharge Summary  Valerie Downs DQQ:[id number] DOB: 1930-02-13 DOA: 08/15/2014  PCP: [facility], MD  Admit date: 08/15/2014 Discharge date: 08/17/2014  Time spent: 35 minutes  Recommendations for Outpatient Follow-up:  1. Follow up with Dr. Hilarie Fredrickson in 4 weeks.  Discharge Diagnoses:  Active Problems:   GI bleeding   HYPERTENSION, BENIGN SYSTEMIC   Chronic systolic CHF (congestive heart failure)   Rectal bleed   AKI (acute kidney injury)   Discharge Condition: stable  Diet recommendation: heart healthy  Filed Weights   08/15/14 1939  Weight: 62.596 kg (138 lb)    History of present illness:  Valerie Downs with a history of breast cancer, cardiomyopathy, HTN, CHF and anemia who presented to [facility] ED after receiving a call from PCP about the abnormal blood test result. Patient and family do not know exactly which test and what the value was but they thinks it was about blood loss and potential need for transfusion. Pt was hospitalized in June 2015 for acute blood loss anemia But colonoscopy was not done as pt was unable to tolerate prep. Pt currently denies chest pain or shortness of breath, no specific abd or urinary concerns. She denies noticing blood in stool or urine.    Hospital Course:  Occult GI bleeding  - FOBT +, Hbg mild drop as pt was hemoconcentrated and drop with hydration, she relates no melena or bright red blood per rectum while in the hospital. - Hbg is unchange from 2 months ago. - cont PPI, - EGD notable for gastritis. Cont protonix. - colonoscopy was normal but apparently not completed as pt not able to complete prep. - follow up with GI.  AKI (acute kidney injury):  - Most likely pre-renal. Resolved with hydration.  - held ACE-I and diuretics..  - resume as an outpatient.   Chronic systolic CHF (congestive heart failure)  - hold ACE-I and lasix, currently euvolemic.   HYPERTENSION, BENIGN SYSTEMIC:  -  controlled.  Procedures: none Consultations:  none  Discharge Exam: Filed Vitals:   08/17/14 1012  BP: 120/52  Pulse: 66  Temp:   Resp: 16    General: see progress note  Discharge Instructions You were cared for by a hospitalist during your hospital stay. If you have any questions about your discharge medications or the care you received while you were in the hospital after you are discharged, you can call the unit and asked to speak with the hospitalist on call if the hospitalist that took care of you is not available. Once you are discharged, your primary care physician will handle any further medical issues. Please note that NO REFILLS for any discharge medications will be authorized once you are discharged, as it is imperative that you return to your primary care physician (or establish a relationship with a primary care physician if you do not have one) for your aftercare needs so that they can reassess your need for medications and monitor your lab values.      Discharge Instructions   Diet - low sodium heart healthy    Complete by:  As directed      Increase activity slowly    Complete by:  As directed             Medication List         feeding supplement (ENSURE COMPLETE) Liqd  Take 237 mLs by mouth daily.     ferrous sulfate 325 (65 FE) MG tablet  Take 325 mg by mouth  3 (three) times daily with meals.     furosemide 20 MG tablet  Commonly known as:  LASIX  Take 20 mg by mouth daily.     losartan 25 MG tablet  Commonly known as:  COZAAR  Take 25 mg by mouth daily.     metoprolol tartrate 25 MG tablet  Commonly known as:  LOPRESSOR  Take 25 mg by mouth 2 (two) times daily.     pantoprazole 40 MG tablet  Commonly known as:  PROTONIX  Take 1 tablet (40 mg total) by mouth 2 (two) times daily.     potassium chloride SA 20 MEQ tablet  Commonly known as:  K-DUR,KLOR-CON  Take 10 mEq by mouth 2 (two) times daily.       Allergies  Allergen Reactions   . Penicillins Anaphylaxis and Swelling   Follow-up Information   Follow up with PYRTLE, Lajuan Lines, MD In 3 weeks. (hospital follow up)    Specialty:  Gastroenterology   Contact information:   [address] [place] [address] [phone]        The results of significant diagnostics from this hospitalization (including imaging, microbiology, ancillary and laboratory) are listed below for reference.    Significant Diagnostic Studies: No results found.  Microbiology: Recent Results (from the past 240 hour(s))  URINE CULTURE     Status: None   Collection Time    08/16/14  5:35 AM      Result Value Ref Range Status   Specimen Description URINE, RANDOM   Final   Special Requests NONE   Final   Culture  Setup Time     Final   Value: 08/16/2014 11:07     Performed at [place]     Final   Value: >=100,000 COLONIES/ML     Performed at [facility]   Culture     Final   Value: Multiple bacterial morphotypes present, none predominant. Suggest appropriate recollection if clinically indicated.     Performed at [facility]   Report Status 08/17/2014 FINAL   Final     Labs: Basic Metabolic Panel:  Recent Labs Lab 08/15/14 1535 08/16/14 0540  NA 140 140  K 4.7 4.7  CL 105 109  CO2 21 21  GLUCOSE 104* 104*  BUN 25* 22  CREATININE 1.36* 1.18*  CALCIUM 10.1 9.7   Liver Function Tests:  Recent Labs Lab 08/15/14 1535  AST 24  ALT 11  ALKPHOS 116  BILITOT <0.2*  PROT 8.2  ALBUMIN 3.6   No results found for this basename: LIPASE, AMYLASE,  in the last 168 hours No results found for this basename: AMMONIA,  in the last 168 hours CBC:  Recent Labs Lab 08/15/14 1535 08/16/14 0540  WBC 5.3 4.1  HGB 8.6* 7.8*  HCT 26.8* 24.3*  MCV 92.1 92.4  PLT 293 234   Cardiac Enzymes: No results found for this basename: CKTOTAL, CKMB, CKMBINDEX, TROPONINI,  in the last 168 hours BNP: BNP (last 3 results) No results found for  this basename: PROBNP,  in the last 8760 hours CBG: No results found for this basename: GLUCAP,  in the last 168 hours     Signed:  Charlynne Cousins  [facility] 08/17/2014, 10:29 AM

## 2014-08-17 NOTE — Progress Notes (Signed)
[facility] PROGRESS NOTE  Assessment/Plan: GI bleeding - FOBT +, Hbg mild drop as pt was hemoconcentrated and drop with hydration, she relates no melena. But the same from 2 months ago - cont PPI, - EGD notable for gastritis. - colonoscopy was normal but apparently not completed as pt not able to complete prep   AKI (acute kidney injury): - Most likely pre-renal. Resolved with hydration. - held ACE-I and diuretics.. - resume as an outpatient.  Chronic systolic CHF (congestive heart failure) - hold ACE-I and lasix, currently euvolemic.  HYPERTENSION, BENIGN SYSTEMIC: - controlled.    Code Status: Full  Family Communication: Pt at bedside  Disposition Plan: Admit for further evaluation     Consultants:  none  Procedures:  CXR  Antibiotics:  None  HPI/Subjective:  1 BM no blood or  melena  Objective: Filed Vitals:   08/16/14 0659 08/16/14 1410 08/16/14 2129 08/17/14 1012  BP: 132/59 126/76 145/63 120/52  Pulse: 62 73 68 66  Temp: 98.5 F (36.9 C) 98.4 F (36.9 C) 97.8 F (36.6 C)   TempSrc: Oral Oral Oral Oral  Resp: 16 18 18 16   Height:      Weight:      SpO2: 100% 99% 100% 100%    Intake/Output Summary (Last 24 hours) at 08/17/14 1020 Last data filed at 08/17/14 9323  Gross per 24 hour  Intake    120 ml  Output    200 ml  Net    -80 ml   Filed Weights   08/15/14 1939  Weight: 62.596 kg (138 lb)    Exam:  General: Alert, awake, oriented x3, refused.  Data Reviewed: Basic Metabolic Panel:  Recent Labs Lab 08/15/14 1535 08/16/14 0540  NA 140 140  K 4.7 4.7  CL 105 109  CO2 21 21  GLUCOSE 104* 104*  BUN 25* 22  CREATININE 1.36* 1.18*  CALCIUM 10.1 9.7   Liver Function Tests:  Recent Labs Lab 08/15/14 1535  AST 24  ALT 11  ALKPHOS 116  BILITOT <0.2*  PROT 8.2  ALBUMIN 3.6   No results found for this basename: LIPASE, AMYLASE,  in the last 168 hours No results found for this basename: AMMONIA,  in the last 168  hours CBC:  Recent Labs Lab 08/15/14 1535 08/16/14 0540  WBC 5.3 4.1  HGB 8.6* 7.8*  HCT 26.8* 24.3*  MCV 92.1 92.4  PLT 293 234   Cardiac Enzymes: No results found for this basename: CKTOTAL, CKMB, CKMBINDEX, TROPONINI,  in the last 168 hours BNP (last 3 results) No results found for this basename: PROBNP,  in the last 8760 hours CBG: No results found for this basename: GLUCAP,  in the last 168 hours  Recent Results (from the past 240 hour(s))  URINE CULTURE     Status: None   Collection Time    08/16/14  5:35 AM      Result Value Ref Range Status   Specimen Description URINE, RANDOM   Final   Special Requests NONE   Final   Culture  Setup Time     Final   Value: 08/16/2014 11:07     Performed at [facility]     Final   Value: >=100,000 COLONIES/ML     Performed at [facility]   Culture     Final   Value: Multiple bacterial morphotypes present, none predominant. Suggest appropriate recollection if clinically indicated.     Performed at  [facility]   Report Status 08/17/2014 FINAL   Final     Studies: No results found.  Scheduled Meds: . feeding supplement (ENSURE COMPLETE)  237 mL Oral Daily  . ferrous sulfate  325 mg Oral TID WC  . metoprolol tartrate  25 mg Oral BID  . pantoprazole  40 mg Oral BID  . potassium chloride SA  10 mEq Oral BID   Continuous Infusions:    Charlynne Cousins  [facility] Pager [phone]. If 8PM-8AM, please contact night-coverage at [email or web address], password [facility] 08/17/2014, 10:20 AM  LOS: 2 days      **Disclaimer: This note may have been dictated with voice recognition software. Similar sounding words can inadvertently be transcribed and this note may contain transcription errors which may not have been corrected upon publication of note.**

## 2014-08-17 NOTE — Progress Notes (Signed)
Pt given discharge instructions. Prescription x 1 given for protonix. Pt encouraged to stop and fill prescription. Voiced understanding with no concerns. Awaiting ride to be transported home. Vwilliams,rn.

## 2014-12-06 ENCOUNTER — Telehealth: Payer: Self-pay | Admitting: Cardiovascular Disease

## 2014-12-06 NOTE — Telephone Encounter (Signed)
New message     Talk to the nurse regarding one of her medications

## 2014-12-06 NOTE — Telephone Encounter (Signed)
Spoke with pt. She has questions about which medications she should be taking. She states Dr. Angelena Form gave her one medication.  I told pt she should be taking Cozaar 25 mg by mouth daily. I told her we had not made any other changes in her medications. She thinks other changes were made by her primary care doctor--Dr. Jimmye Norman.

## 2015-02-26 ENCOUNTER — Encounter: Payer: Self-pay | Admitting: Cardiovascular Disease

## 2015-02-26 ENCOUNTER — Ambulatory Visit (INDEPENDENT_AMBULATORY_CARE_PROVIDER_SITE_OTHER): Payer: Medicare Other | Admitting: Cardiovascular Disease

## 2015-02-26 VITALS — BP 132/64 | HR 67 | Ht 62.0 in | Wt 140.0 lb

## 2015-02-26 DIAGNOSIS — I340 Nonrheumatic mitral (valve) insufficiency: Secondary | ICD-10-CM

## 2015-02-26 DIAGNOSIS — I351 Nonrheumatic aortic (valve) insufficiency: Secondary | ICD-10-CM

## 2015-02-26 DIAGNOSIS — I5022 Chronic systolic (congestive) heart failure: Secondary | ICD-10-CM

## 2015-02-26 DIAGNOSIS — I428 Other cardiomyopathies: Secondary | ICD-10-CM

## 2015-02-26 DIAGNOSIS — I429 Cardiomyopathy, unspecified: Secondary | ICD-10-CM

## 2015-02-26 DIAGNOSIS — I10 Essential (primary) hypertension: Secondary | ICD-10-CM | POA: Diagnosis not present

## 2015-02-26 LAB — BASIC METABOLIC PANEL
BUN: 30 mg/dL — ABNORMAL HIGH (ref 6–23)
CO2: 26 meq/L (ref 19–32)
CREATININE: 1.29 mg/dL — AB (ref 0.40–1.20)
Calcium: 10.3 mg/dL (ref 8.4–10.5)
Chloride: 107 mEq/L (ref 96–112)
GFR: 50.8 mL/min — ABNORMAL LOW (ref >60.00)
Glucose, Bld: 80 mg/dL (ref 70–99)
Potassium: 4.2 mEq/L (ref 3.5–5.1)
SODIUM: 139 meq/L (ref 135–145)

## 2015-02-26 NOTE — Progress Notes (Signed)
History of Present Illness: 81 yo female with history with history of breast cancer, systolic LV dysfunction due to presumed non-ischemic cardiomyopathy who is here today for cardiac follow up. She was admitted to [facility] 07/26/13 with c/o SOB. She was found to have volume overload with BNP of 13,000. Chest x-ray with mild vascular congestion. Echo 07/27/13 with severe LVH, LVEF 40-45%, moderate AI, moderate MR, moderate TR. She was diuresed and discharged home 07/29/13. She has done well since then. Echo 08/07/14 with LVEF=55-60%, mild to moderate AI and mild MR.   She is here today for follow up. She tells me today that she feels great. No chest pain or SOB. No LE edema. Tolerating all meds. Weight is stable.   Primary Care Physician:  Grant Fontana  Past Medical History  Diagnosis Date  . Breast cancer   . Cardiomyopathy   . Aortic insufficiency   . Mitral regurgitation   . Hypertension   . CHF (congestive heart failure)     Past Surgical History  Procedure Laterality Date  . Mastectomy Left   . Colonoscopy N/A 06/22/2014    Procedure: COLONOSCOPY;  Surgeon: Jerene Bears, MD;  Location: [facility] ENDOSCOPY;  Service: Endoscopy;  Laterality: N/A;  . Esophagogastroduodenoscopy N/A 06/22/2014    Procedure: ESOPHAGOGASTRODUODENOSCOPY (EGD);  Surgeon: Jerene Bears, MD;  Location: [facility];  Service: Endoscopy;  Laterality: N/A;    Current Outpatient Prescriptions  Medication Sig Dispense Refill  . feeding supplement, ENSURE COMPLETE, (ENSURE COMPLETE) LIQD Take 237 mLs by mouth daily.    . ferrous sulfate 325 (65 FE) MG tablet Take 325 mg by mouth 3 (three) times daily with meals.     . furosemide (LASIX) 20 MG tablet Take 20 mg by mouth daily.    Marland Kitchen losartan (COZAAR) 25 MG tablet Take 25 mg by mouth daily.    . metoprolol tartrate (LOPRESSOR) 25 MG tablet Take 25 mg by mouth 2 (two) times daily.     . pantoprazole (PROTONIX) 40 MG tablet Take 1 tablet (40 mg total) by mouth 2  (two) times daily. (Patient not taking: Reported on 02/26/2015) 60 tablet 3  . potassium chloride SA (K-DUR,KLOR-CON) 20 MEQ tablet Take 10 mEq by mouth 2 (two) times daily.     No current facility-administered medications for this visit.    Allergies  Allergen Reactions  . Penicillins Anaphylaxis and Swelling    History   Social History  . Marital Status: Widowed    Spouse Name: N/A  . Number of Children: N/A  . Years of Education: N/A   Occupational History  . Not on file.   Social History Main Topics  . Smoking status: Former Smoker -- 50 years    Types: Cigarettes    Quit date: 06/25/2012  . Smokeless tobacco: Current User    Types: Snuff  . Alcohol Use: No  . Drug Use: No  . Sexual Activity: No   Other Topics Concern  . Not on file   Social History Narrative    Family History  Problem Relation Age of Onset  . Hypertension Mother     Review of Systems:  As stated in the HPI and otherwise negative.   BP 132/64 mmHg  Pulse 67  Ht 5\' 2"  (1.575 m)  Wt 140 lb (63.504 kg)  BMI 25.60 kg/m2  Physical Examination: General: Well developed, well nourished, NAD HEENT: OP clear, mucus membranes moist SKIN: warm, dry. No rashes. Neuro: No focal deficits Musculoskeletal:  Muscle strength 5/5 all ext Psychiatric: Mood and affect normal Neck: No JVD, no carotid bruits, no thyromegaly, no lymphadenopathy. Lungs:Clear bilaterally, no wheezes, rhonci, crackles Cardiovascular: Regular rate and rhythm. Systolic murmur. No gallops or rubs. Abdomen:Soft. Bowel sounds present. Non-tender.  Extremities: No lower extremity edema. Pulses are 2 + in the bilateral DP/PT.  Echo 08/07/14: Left ventricle: Severe basal septal hypertrophy No obvious SAM, LVOT gradient or subvalvular web. The cavity size was normal. Systolic function was normal. The estimated ejection fraction was in the range of 55% to 60%. - Aortic valve: There was mild to moderate regurgitation. - Aorta:  Aortic root appears dilated but poorly seen Consider f/u MRA/CT for better measurement. - Mitral valve: Calcified annulus. Mildly thickened leaflets . There was mild regurgitation. - Left atrium: The atrium was mildly dilated. - Atrial septum: No defect or patent foramen ovale was identified. - Pulmonary arteries: PA peak pressure: 34 mm Hg (S).  EKG: Sinus, rate 67 bpm. PACs. Poor R wave progression.    Assessment and Plan:   1. Non-ischemic Cardiomyopathy: LV systolic function has improved on good medical therapy. Last LVEF=55-60% by echo August 2015. She has no known obstructive CAD. Likely due to hypertension with severe LVH on echo. Will not pursue ischemic evaluation at this time. Will continue beta blocker and ARB. She did not tolerate Ace-inh due to cough.   2. Chronic systolic CHF: Will continue Lasix 20 mg po daily. She will follow daily weights and take extra Lasix as needed for weight gain. She stopped her KDur. Repeat BMET today.   3. Mitral regurgitation: Mild by echo August 2015.   4. Aortic insufficiency: Mild to Moderate by echo August 2015.   5. HTN: BP controlled. No changes today.

## 2015-02-26 NOTE — Patient Instructions (Signed)
Your physician wants you to follow-up in:  12 months.  You will receive a reminder letter in the mail two months in advance. If you don't receive a letter, please call our office to schedule the follow-up appointment.   

## 2015-03-28 ENCOUNTER — Emergency Department (HOSPITAL_COMMUNITY): Payer: Medicare Other

## 2015-03-28 ENCOUNTER — Emergency Department (HOSPITAL_COMMUNITY)
Admission: EM | Admit: 2015-03-28 | Discharge: 2015-03-28 | Disposition: A | Discharge status: Home / Self Care | Payer: Medicare Other | Attending: Emergency Medicine | Admitting: Emergency Medicine

## 2015-03-28 ENCOUNTER — Encounter (HOSPITAL_COMMUNITY): Payer: Self-pay | Admitting: Emergency Medicine

## 2015-03-28 DIAGNOSIS — M25512 Pain in left shoulder: Secondary | ICD-10-CM | POA: Diagnosis not present

## 2015-03-28 DIAGNOSIS — M19011 Primary osteoarthritis, right shoulder: Secondary | ICD-10-CM | POA: Diagnosis not present

## 2015-03-28 DIAGNOSIS — R52 Pain, unspecified: Secondary | ICD-10-CM | POA: Diagnosis present

## 2015-03-28 DIAGNOSIS — Z853 Personal history of malignant neoplasm of breast: Secondary | ICD-10-CM | POA: Diagnosis not present

## 2015-03-28 DIAGNOSIS — Z880 Allergy status to penicillin: Secondary | ICD-10-CM | POA: Diagnosis not present

## 2015-03-28 DIAGNOSIS — M542 Cervicalgia: Secondary | ICD-10-CM | POA: Insufficient documentation

## 2015-03-28 DIAGNOSIS — M25511 Pain in right shoulder: Secondary | ICD-10-CM | POA: Diagnosis not present

## 2015-03-28 DIAGNOSIS — Z87891 Personal history of nicotine dependence: Secondary | ICD-10-CM | POA: Diagnosis not present

## 2015-03-28 DIAGNOSIS — I509 Heart failure, unspecified: Secondary | ICD-10-CM | POA: Insufficient documentation

## 2015-03-28 DIAGNOSIS — M1711 Unilateral primary osteoarthritis, right knee: Secondary | ICD-10-CM | POA: Diagnosis not present

## 2015-03-28 DIAGNOSIS — M25561 Pain in right knee: Secondary | ICD-10-CM

## 2015-03-28 DIAGNOSIS — R260 Ataxic gait: Secondary | ICD-10-CM | POA: Diagnosis not present

## 2015-03-28 DIAGNOSIS — Z79899 Other long term (current) drug therapy: Secondary | ICD-10-CM | POA: Insufficient documentation

## 2015-03-28 DIAGNOSIS — I10 Essential (primary) hypertension: Secondary | ICD-10-CM | POA: Diagnosis not present

## 2015-03-28 MED ORDER — OXYCODONE-ACETAMINOPHEN 5-325 MG PO TABS: 1.0000 | ORAL_TABLET | Freq: Once | ORAL | Status: DC

## 2015-03-28 MED ORDER — HYDROCODONE-ACETAMINOPHEN 5-325 MG PO TABS
1.0000 | ORAL_TABLET | Freq: Once | ORAL | Status: AC
  Administered 2015-03-28: 1 via ORAL
  Filled 2015-03-28: qty 1

## 2015-03-28 NOTE — ED Provider Notes (Signed)
Medical screening examination/treatment/procedure(s) were conducted as a shared visit with non-physician practitioner(s) and myself.  I personally evaluated the patient during the encounter.   EKG Interpretation None      81 year old female presenting with complaint of bilateral knee pain. Has been going on for at least 2 weeks. Worse on the right. Sometimes it is hard for her to get out of bed in the morning. She is stiff when she first tries to walk, but loosens up with walking.  On exam, well appearing, nontoxic, not distressed, normal respiratory effort, normal perfusion, bilateral knees without effusion, erythema, warmth, swelling. No lower extremity edema. Neurovascularly intact distally in both legs. Plan symptomatic treatment.  Clinical Impression: 1. Shoulder pain, acute, left    bilateral knee pain    Valerie Grit, MD 03/28/15 [phone]

## 2015-03-28 NOTE — ED Notes (Signed)
Per pt, states she aches all over-first started last Monday, went away and started back again yesterday

## 2015-03-28 NOTE — Progress Notes (Addendum)
Consulted by EDPA/NP to provided DME and possible HHPT  Pt to be evaluated  7939 Pt evaluated Pt does not prefer HHPT.  Primary caregiver is her daughter at the bedside She and daughter choice for DME was care [facility] [facility] [place] [facility] provided Cm spoke with [facility] at [phone] to confirm they are unable to provide service to [place] health care clients.  [facility] contacted and a voice message was left to obtain DME for pt. Pending response Preference is for rolling walker delivery to [facility] prior to d/c. ED PA/NP/RN updated

## 2015-03-28 NOTE — Discharge Instructions (Signed)
Return to the emergency room with worsening of symptoms, new symptoms or with symptoms that are concerning, especially fevers, redness, swelling, numbness, tingling, weakness. RICE: Rest, Ice (three cycles of 20 mins on, 58mins off at least twice a day), compression/brace, elevation. Heating pad works well for back pain. Use your walker at home until you have been evaluated by orthopedics. Tylenol for pain. Follow up with orthopedist. Call to make appointment. Number provided above. Call to make appointment with the wellness center to establish care with a primary doctor. Number above. Read below information and follow recommendations.   Arthralgia Your caregiver has diagnosed you as suffering from an arthralgia. Arthralgia means there is pain in a joint. This can come from many reasons including:  Bruising the joint which causes soreness (inflammation) in the joint.  Wear and tear on the joints which occur as we grow older (osteoarthritis).  Overusing the joint.  Various forms of arthritis.  Infections of the joint. Regardless of the cause of pain in your joint, most of these different pains respond to anti-inflammatory drugs and rest. The exception to this is when a joint is infected, and these cases are treated with antibiotics, if it is a bacterial infection. HOME CARE INSTRUCTIONS   Rest the injured area for as long as directed by your caregiver. Then slowly start using the joint as directed by your caregiver and as the pain allows. Crutches as directed may be useful if the ankles, knees or hips are involved. If the knee was splinted or casted, continue use and care as directed. If an stretchy or elastic wrapping bandage has been applied today, it should be removed and re-applied every 3 to 4 hours. It should not be applied tightly, but firmly enough to keep swelling down. Watch toes and feet for swelling, bluish discoloration, coldness, numbness or excessive pain. If any of these  problems (symptoms) occur, remove the ace bandage and re-apply more loosely. If these symptoms persist, contact your caregiver or return to this location.  For the first 24 hours, keep the injured extremity elevated on pillows while lying down.  Apply ice for 15-20 minutes to the sore joint every couple hours while awake for the first half day. Then 03-04 times per day for the first 48 hours. Put the ice in a plastic bag and place a towel between the bag of ice and your skin.  Wear any splinting, casting, elastic bandage applications, or slings as instructed.  Only take over-the-counter or prescription medicines for pain, discomfort, or fever as directed by your caregiver. Do not use aspirin immediately after the injury unless instructed by your physician. Aspirin can cause increased bleeding and bruising of the tissues.  If you were given crutches, continue to use them as instructed and do not resume weight bearing on the sore joint until instructed. Persistent pain and inability to use the sore joint as directed for more than 2 to 3 days are warning signs indicating that you should see a caregiver for a follow-up visit as soon as possible. Initially, a hairline fracture (break in bone) may not be evident on X-rays. Persistent pain and swelling indicate that further evaluation, non-weight bearing or use of the joint (use of crutches or slings as instructed), or further X-rays are indicated. X-rays may sometimes not show a small fracture until a week or 10 days later. Make a follow-up appointment with your own caregiver or one to whom we have referred you. A radiologist (specialist in reading X-rays) may read  your X-rays. Make sure you know how you are to obtain your X-ray results. Do not assume everything is normal if you do not hear from [place]. SEEK MEDICAL CARE IF: Bruising, swelling, or pain increases. SEEK IMMEDIATE MEDICAL CARE IF:   Your fingers or toes are numb or blue.  The pain is not  responding to medications and continues to stay the same or get worse.  The pain in your joint becomes severe.  You develop a fever over 102 F (38.9 C).  It becomes impossible to move or use the joint. MAKE SURE YOU:   Understand these instructions.  Will watch your condition.  Will get help right away if you are not doing well or get worse. Document Released: 12/13/2005 Document Revised: 03/06/2012 Document Reviewed: 07/31/2008 [facility] Patient Information 2015 [place], [place]. This information is not intended to replace advice given to you by your health care provider. Make sure you discuss any questions you have with your health care provider.

## 2015-03-28 NOTE — ED Notes (Signed)
Pt c/o pain in bilateral knees, shoulders, neck,head area

## 2015-03-28 NOTE — ED Provider Notes (Signed)
CSN: [id number]     Arrival date & time 03/28/15  1228 History   First MD Initiated Contact with Patient 03/28/15 1329     Chief Complaint  Patient presents with  . Generalized Body Aches     (Consider location/radiation/quality/duration/timing/severity/associated sxs/prior Treatment) HPI  Valerie Downs is a 81 y.o. female with PMH of congestive heart failure, hypertension, cardiomyopathy, breast cancer presenting with bilateral knee pain worse on right as well as left shoulder pain describes ache and worse with movement. She denies alleviating factors. No numbness tingling or weakness. No falls or head injury. Patient not on anticoagulants. She denies any chest pain, shortness of breath, nausea, vomiting, visual changes or slurred speech. No headaches. No back pain. No fevers, chills, erythema, swelling.    Past Medical History  Diagnosis Date  . Breast cancer   . Cardiomyopathy   . Aortic insufficiency   . Mitral regurgitation   . Hypertension   . CHF (congestive heart failure)    Past Surgical History  Procedure Laterality Date  . Mastectomy Left   . Colonoscopy N/A 06/22/2014    Procedure: COLONOSCOPY;  Surgeon: Jerene Bears, MD;  Location: [facility];  Service: Endoscopy;  Laterality: N/A;  . Esophagogastroduodenoscopy N/A 06/22/2014    Procedure: ESOPHAGOGASTRODUODENOSCOPY (EGD);  Surgeon: Jerene Bears, MD;  Location: [facility];  Service: Endoscopy;  Laterality: N/A;   Family History  Problem Relation Age of Onset  . Hypertension Mother    History  Substance Use Topics  . Smoking status: Former Smoker -- 50 years    Types: Cigarettes    Quit date: 06/25/2012  . Smokeless tobacco: Current User    Types: Snuff  . Alcohol Use: No   OB History    No data available     Review of Systems 10 Systems reviewed and are negative for acute change except as noted in the HPI.    Allergies  Penicillins  Home Medications   Prior to Admission medications   Medication  Sig Start Date End Date Taking? Authorizing Provider  ferrous sulfate 325 (65 FE) MG tablet Take 325 mg by mouth 3 (three) times daily with meals.    Yes Historical Provider, MD  furosemide (LASIX) 20 MG tablet Take 20 mg by mouth daily.   Yes Historical Provider, MD  losartan (COZAAR) 25 MG tablet Take 25 mg by mouth daily.   Yes Historical Provider, MD  metoprolol tartrate (LOPRESSOR) 25 MG tablet Take 25 mg by mouth 2 (two) times daily.    Yes Historical Provider, MD  Phenylephrine-Pheniramine-DM [facility] COLD & COUGH PO) Take 1 Package by mouth as needed (cold and flu symptoms).   Yes Historical Provider, MD  pantoprazole (PROTONIX) 40 MG tablet Take 1 tablet (40 mg total) by mouth 2 (two) times daily. Patient not taking: Reported on 02/26/2015 08/17/14   Charlynne Cousins, MD   BP 118/73 mmHg  Pulse 85  Temp(Src) 98.3 F (36.8 C) (Oral)  Resp 18  SpO2 97% Physical Exam  Constitutional: She appears well-developed and well-nourished. No distress.  HENT:  Head: Normocephalic and atraumatic.  Mouth/Throat: Oropharynx is clear and moist.  Eyes: Conjunctivae and EOM are normal. Right eye exhibits no discharge. Left eye exhibits no discharge.  Cardiovascular: Normal rate and regular rhythm.   Pulmonary/Chest: Effort normal and breath sounds normal. No respiratory distress. She has no wheezes.  Abdominal: Soft. Bowel sounds are normal. She exhibits no distension. There is no tenderness.  Musculoskeletal:  Tenderness to anterior  and posterior left shoulder with trapezius muscle tightness. Full range of motion, strength and sensation intact.  No other back pain no midline back pain step off or crepitus. Tenderness to right anterior knee without any tenderness to medial and lateral joint line. No effusion noted. No ligamentous laxity full range of motion without crepitus. No tenderness to left knee.  Neurological: She is alert. She exhibits normal muscle tone. Coordination normal.  Strength  and sensation intact. No pronator drift. Normal steady gait without assistance  Skin: Skin is warm and dry. She is not diaphoretic.  Nursing note and vitals reviewed.   ED Course  Procedures (including critical care time) Labs Review Labs Reviewed - No data to display  Imaging Review Dg Shoulder Right  03/28/2015   CLINICAL DATA:  Right shoulder pain for 5 days, no known injury, initial encounter  EXAM: RIGHT SHOULDER - 2+ VIEW  COMPARISON:  None.  FINDINGS: No acute fracture dislocation is noted. The humeral head is somewhat high-riding consistent with the underlying rotator cuff injury. Additionally superior subluxation in the glenoid labrum is noted.  IMPRESSION: Degenerative change without acute abnormality.   Electronically Signed   By: Inez Catalina M.D.   On: 03/28/2015 14:58   Dg Knee Complete 4 Views Right  03/28/2015   CLINICAL DATA:  Right knee pain for 5 days, initial encounter no specific injury noted.  EXAM: RIGHT KNEE - COMPLETE 4+ VIEW  COMPARISON:  None.  FINDINGS: Degenerative changes of the right knee joint are noted most marked in the medial joint space with osteophytic change. No acute fracture or dislocation is seen. No joint effusion is noted.  IMPRESSION: Degenerative change without acute abnormality.   Electronically Signed   By: Inez Catalina M.D.   On: 03/28/2015 14:56     EKG Interpretation None      MDM   Final diagnoses:  Shoulder pain, acute, left  Right knee pain  Neck pain on left side   Patient presenting with bilateral knee and left shoulder pain without any acute injury. Neurovascularly intact and no effusion noted to knees. X-rays of right knee left shoulder without evidence of acute fracture. Left shoulder with high riding humerus consistent with possible rotational cuff tear. Patient to follow-up with orthopedics. No neurological deficits and patient with steady gait and no assistance. No recent history of falls. Recommend ambulation with walker.  Walker ordered by case management. Pt refusing PT in home at this time. Pt given sling and knee sleeve. Pt encouraged to continue to move shoulder to prevent adhesive capsulitis. Pt to establish care and follow up with the wellness center as well.   Discussed return precautions with patient. Discussed all results and patient verbalizes understanding and agrees with plan.  This is a shared patient. This patient was discussed with the physician who saw and evaluated the patient and agrees with the plan.   Al Corpus, PA-C 03/28/15 [place], MD 03/29/15 [phone]

## 2015-04-07 DIAGNOSIS — M25562 Pain in left knee: Secondary | ICD-10-CM | POA: Diagnosis not present

## 2015-04-07 DIAGNOSIS — M436 Torticollis: Secondary | ICD-10-CM | POA: Diagnosis not present

## 2015-04-07 DIAGNOSIS — M1712 Unilateral primary osteoarthritis, left knee: Secondary | ICD-10-CM | POA: Diagnosis not present

## 2015-04-07 DIAGNOSIS — M25561 Pain in right knee: Secondary | ICD-10-CM | POA: Diagnosis not present

## 2015-04-07 DIAGNOSIS — M25511 Pain in right shoulder: Secondary | ICD-10-CM | POA: Diagnosis not present

## 2015-06-16 ENCOUNTER — Other Ambulatory Visit: Payer: Self-pay | Admitting: *Deleted

## 2015-06-16 MED ORDER — LOSARTAN POTASSIUM 25 MG PO TABS: 25.0000 mg | ORAL_TABLET | Freq: Every day | ORAL | Status: DC

## 2015-07-22 DIAGNOSIS — R42 Dizziness and giddiness: Secondary | ICD-10-CM | POA: Diagnosis not present

## 2015-07-22 DIAGNOSIS — R5382 Chronic fatigue, unspecified: Secondary | ICD-10-CM | POA: Diagnosis not present

## 2015-07-22 DIAGNOSIS — E785 Hyperlipidemia, unspecified: Secondary | ICD-10-CM | POA: Diagnosis not present

## 2015-07-22 DIAGNOSIS — I10 Essential (primary) hypertension: Secondary | ICD-10-CM | POA: Diagnosis not present

## 2015-07-25 DIAGNOSIS — H02834 Dermatochalasis of left upper eyelid: Secondary | ICD-10-CM | POA: Diagnosis not present

## 2015-07-25 DIAGNOSIS — H02831 Dermatochalasis of right upper eyelid: Secondary | ICD-10-CM | POA: Diagnosis not present

## 2015-07-25 DIAGNOSIS — D3131 Benign neoplasm of right choroid: Secondary | ICD-10-CM | POA: Diagnosis not present

## 2015-07-25 DIAGNOSIS — Z961 Presence of intraocular lens: Secondary | ICD-10-CM | POA: Diagnosis not present

## 2015-08-12 ENCOUNTER — Encounter (HOSPITAL_COMMUNITY): Payer: Self-pay | Admitting: Emergency Medicine

## 2015-08-12 ENCOUNTER — Emergency Department (HOSPITAL_COMMUNITY): Payer: Medicare Other

## 2015-08-12 ENCOUNTER — Emergency Department (HOSPITAL_COMMUNITY)
Admission: EM | Admit: 2015-08-12 | Discharge: 2015-08-12 | Disposition: A | Discharge status: Home / Self Care | Payer: Medicare Other | Attending: Emergency Medicine | Admitting: Emergency Medicine

## 2015-08-12 DIAGNOSIS — Z853 Personal history of malignant neoplasm of breast: Secondary | ICD-10-CM | POA: Diagnosis not present

## 2015-08-12 DIAGNOSIS — Y9289 Other specified places as the place of occurrence of the external cause: Secondary | ICD-10-CM | POA: Diagnosis not present

## 2015-08-12 DIAGNOSIS — I509 Heart failure, unspecified: Secondary | ICD-10-CM | POA: Insufficient documentation

## 2015-08-12 DIAGNOSIS — Y998 Other external cause status: Secondary | ICD-10-CM | POA: Diagnosis not present

## 2015-08-12 DIAGNOSIS — S82892A Other fracture of left lower leg, initial encounter for closed fracture: Secondary | ICD-10-CM | POA: Diagnosis not present

## 2015-08-12 DIAGNOSIS — Z880 Allergy status to penicillin: Secondary | ICD-10-CM | POA: Insufficient documentation

## 2015-08-12 DIAGNOSIS — Y9389 Activity, other specified: Secondary | ICD-10-CM | POA: Diagnosis not present

## 2015-08-12 DIAGNOSIS — Z87891 Personal history of nicotine dependence: Secondary | ICD-10-CM | POA: Diagnosis not present

## 2015-08-12 DIAGNOSIS — M25572 Pain in left ankle and joints of left foot: Secondary | ICD-10-CM | POA: Diagnosis not present

## 2015-08-12 DIAGNOSIS — S99922A Unspecified injury of left foot, initial encounter: Secondary | ICD-10-CM | POA: Diagnosis not present

## 2015-08-12 DIAGNOSIS — Z79899 Other long term (current) drug therapy: Secondary | ICD-10-CM | POA: Insufficient documentation

## 2015-08-12 DIAGNOSIS — W1839XA Other fall on same level, initial encounter: Secondary | ICD-10-CM | POA: Insufficient documentation

## 2015-08-12 DIAGNOSIS — I10 Essential (primary) hypertension: Secondary | ICD-10-CM | POA: Diagnosis not present

## 2015-08-12 DIAGNOSIS — S99912A Unspecified injury of left ankle, initial encounter: Secondary | ICD-10-CM | POA: Diagnosis present

## 2015-08-12 MED ORDER — HYDROCODONE-ACETAMINOPHEN 5-325 MG PO TABS: 1.0000 | ORAL_TABLET | Freq: Four times a day (QID) | ORAL | Status: DC | PRN

## 2015-08-12 NOTE — ED Notes (Signed)
Pt states that she fell yesterday.  Pt c/o left ankle pain. swelling noted to ankle.

## 2015-08-12 NOTE — Discharge Instructions (Signed)
Return here as needed.  Follow-up with the orthopedic provided.  Ice and elevate your ankle

## 2015-08-12 NOTE — ED Provider Notes (Signed)
CSN: [id number]     Arrival date & time 08/12/15  13 History   First MD Initiated Contact with Patient 08/12/15 2221     Chief Complaint  Patient presents with  . Ankle Injury  . Fall     (Consider location/radiation/quality/duration/timing/severity/associated sxs/prior Treatment) HPI Patient presents to the emergency department with left ankle injury after a fall yesterday.  The patient states that she was putting close out on the clothesline when she twisted her ankle.  She states that she has had swelling and pain in ankle.  Since that time.  She states she did not hit her head and does not have any neck pain or hip pain.  Patient states that she has been able to ambulate but does have pain with ambulation to the left ankle.  Patient denies headache, blurred vision, chest pain, shortness of breath, abdominal pain, back pain, neck pain, fever, dizziness, lightheadedness or syncope.  Patient states that she did not take any medications prior to arrival for her symptoms Past Medical History  Diagnosis Date  . Breast cancer   . Cardiomyopathy   . Aortic insufficiency   . Mitral regurgitation   . Hypertension   . CHF (congestive heart failure)    Past Surgical History  Procedure Laterality Date  . Mastectomy Left   . Colonoscopy N/A 06/22/2014    Procedure: COLONOSCOPY;  Surgeon: Jerene Bears, MD;  Location: [facility];  Service: Endoscopy;  Laterality: N/A;  . Esophagogastroduodenoscopy N/A 06/22/2014    Procedure: ESOPHAGOGASTRODUODENOSCOPY (EGD);  Surgeon: Jerene Bears, MD;  Location: [facility];  Service: Endoscopy;  Laterality: N/A;   Family History  Problem Relation Age of Onset  . Hypertension Mother    Social History  Substance Use Topics  . Smoking status: Former Smoker -- 50 years    Types: Cigarettes    Quit date: 06/25/2012  . Smokeless tobacco: Current User    Types: Snuff  . Alcohol Use: No   OB History    No data available     Review of Systems  All  other systems negative except as documented in the HPI. All pertinent positives and negatives as reviewed in the HPI.  Allergies  Penicillins  Home Medications   Prior to Admission medications   Medication Sig Start Date End Date Taking? Authorizing Provider  ferrous sulfate 325 (65 FE) MG tablet Take 325 mg by mouth 3 (three) times daily with meals.    Yes Historical Provider, MD  furosemide (LASIX) 20 MG tablet Take 20 mg by mouth daily.   Yes Historical Provider, MD  losartan (COZAAR) 25 MG tablet Take 1 tablet (25 mg total) by mouth daily. 06/16/15  Yes Burnell Blanks, MD  metoprolol tartrate (LOPRESSOR) 25 MG tablet Take 25 mg by mouth 2 (two) times daily.    Yes Historical Provider, MD  pantoprazole (PROTONIX) 40 MG tablet Take 1 tablet (40 mg total) by mouth 2 (two) times daily. Patient not taking: Reported on 02/26/2015 08/17/14   Charlynne Cousins, MD  Phenylephrine-Pheniramine-DM [facility] COLD & COUGH PO) Take 1 Package by mouth as needed (cold and flu symptoms).    Historical Provider, MD   BP 165/64 mmHg  Pulse 85  Temp(Src) 97.9 F (36.6 C) (Oral)  Resp 18  SpO2 98% Physical Exam  Constitutional: She appears well-developed and well-nourished. No distress.  HENT:  Head: Normocephalic and atraumatic.  Mouth/Throat: Oropharynx is clear and moist.  Eyes: Pupils are equal, round, and reactive to light.  Neck: Normal range of motion. Neck supple. No thyromegaly present.  Pulmonary/Chest: Effort normal.  Musculoskeletal:       Left ankle: She exhibits decreased range of motion and swelling. She exhibits no ecchymosis, no laceration and normal pulse. Tenderness. Achilles tendon normal.  Skin: Skin is warm and dry. No rash noted. No erythema.  Psychiatric: She has a normal mood and affect. Her behavior is normal.  Nursing note and vitals reviewed.   ED Course  Procedures (including critical care time) Labs Review Labs Reviewed - No data to display  Imaging  Review Dg Ankle Complete Left  08/12/2015   CLINICAL DATA:  Status post fall, with lateral ankle pain. Initial encounter.  EXAM: LEFT ANKLE COMPLETE - 3+ VIEW  COMPARISON:  Left ankle radiographs performed 03/12/2010  FINDINGS: Tiny osseous fragments at the distal tip of the distal fibula may reflect an avulsion injury. There is no additional evidence of fracture. The ankle mortise is intact; the interosseous space is within normal limits. No talar tilt or subluxation is seen.  The joint spaces are preserved. Mild lateral soft tissue swelling is noted.  IMPRESSION: Tiny osseous fragments at the distal tip of the distal fibula may reflect an avulsion injury.   Electronically Signed   By: Garald Balding M.D.   On: 08/12/2015 17:52   Dg Foot Complete Left  08/12/2015   CLINICAL DATA:  Patient fell yesterday and injured the left foot and ankle. Initial encounter.  EXAM: LEFT FOOT - COMPLETE 3+ VIEW  COMPARISON:  Left ankle x-rays obtained concurrently. No prior left foot imaging.  FINDINGS: Generalized osseous demineralization. No evidence of acute fracture or dislocation. Mild joint space narrowing involving the 1st MTP joint without associated erosions. Remaining joint spaces preserved joint spaces for age.  IMPRESSION: No acute osseous abnormality. Osseous demineralization. 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   Electronically Signed   By: Evangeline Dakin M.D.   On: 08/12/2015 17:53   I have personally reviewed and evaluated these images and lab results as part of my medical decision-making.    Patient will be referred to orthopedics.  Told ice and elevate her ankle.  Told to return here as needed.  The patient rates the plan and all questions were answered  Dalia Heading, PA-C 08/12/15 2306  Leo Grosser, MD 08/13/15 1504

## 2015-08-19 DIAGNOSIS — S8262XA Displaced fracture of lateral malleolus of left fibula, initial encounter for closed fracture: Secondary | ICD-10-CM | POA: Diagnosis not present

## 2016-06-08 ENCOUNTER — Telehealth: Payer: Self-pay | Admitting: Cardiovascular Disease

## 2016-06-08 ENCOUNTER — Other Ambulatory Visit (INDEPENDENT_AMBULATORY_CARE_PROVIDER_SITE_OTHER): Payer: Medicare Other | Admitting: *Deleted

## 2016-06-08 ENCOUNTER — Other Ambulatory Visit (HOSPITAL_COMMUNITY)
Admit: 2016-06-08 | Discharge: 2016-06-08 | Disposition: A | Discharge status: Home / Self Care | Payer: Medicare Other | Source: Ambulatory Visit | Attending: Nurse Practitioner | Admitting: Nurse Practitioner

## 2016-06-08 ENCOUNTER — Ambulatory Visit
Admission: RE | Admit: 2016-06-08 | Discharge: 2016-06-08 | Disposition: A | Discharge status: Home / Self Care | Payer: Medicare Other | Source: Ambulatory Visit | Attending: Nurse Practitioner | Admitting: Nurse Practitioner

## 2016-06-08 DIAGNOSIS — R0602 Shortness of breath: Secondary | ICD-10-CM | POA: Diagnosis not present

## 2016-06-08 LAB — BASIC METABOLIC PANEL
Anion gap: 6 (ref 5–15)
BUN: 22 mg/dL — ABNORMAL HIGH (ref 6–20)
CO2: 24 mmol/L (ref 22–32)
Calcium: 10.1 mg/dL (ref 8.9–10.3)
Chloride: 109 mmol/L (ref 101–111)
Creatinine, Ser: 1.04 mg/dL — ABNORMAL HIGH (ref 0.44–1.00)
GFR calc Af Amer: 56 mL/min — ABNORMAL LOW (ref >60)
GFR calc non Af Amer: 48 mL/min — ABNORMAL LOW (ref >60)
Glucose, Bld: 138 mg/dL — ABNORMAL HIGH (ref 65–99)
Potassium: 4.8 mmol/L (ref 3.5–5.1)
Sodium: 139 mmol/L (ref 135–145)

## 2016-06-08 LAB — CBC WITH DIFFERENTIAL/PLATELET
Basophils Absolute: 0 10*3/uL (ref 0.0–0.1)
Basophils Relative: 0 %
Eosinophils Absolute: 0.1 10*3/uL (ref 0.0–0.7)
Eosinophils Relative: 2 %
HCT: 28.1 % — ABNORMAL LOW (ref 36.0–46.0)
Hemoglobin: 8.6 g/dL — ABNORMAL LOW (ref 12.0–15.0)
Lymphocytes Relative: 47 %
Lymphs Abs: 2.4 10*3/uL (ref 0.7–4.0)
MCH: 26.2 pg (ref 26.0–34.0)
MCHC: 30.6 g/dL (ref 30.0–36.0)
MCV: 85.7 fL (ref 78.0–100.0)
Monocytes Absolute: 0.5 10*3/uL (ref 0.1–1.0)
Monocytes Relative: 9 %
Neutro Abs: 2.1 10*3/uL (ref 1.7–7.7)
Neutrophils Relative %: 42 %
Platelets: 210 10*3/uL (ref 150–400)
RBC: 3.28 MIL/uL — ABNORMAL LOW (ref 3.87–5.11)
RDW: 16.1 % — ABNORMAL HIGH (ref 11.5–15.5)
WBC: 5.1 10*3/uL (ref 4.0–10.5)

## 2016-06-08 LAB — BRAIN NATRIURETIC PEPTIDE: B Natriuretic Peptide: 1011.3 pg/mL — ABNORMAL HIGH (ref 0.0–100.0)

## 2016-06-08 NOTE — Addendum Note (Signed)
Addended by: Eulis Foster on: 06/08/2016 02:27 PM   Modules accepted: Orders

## 2016-06-08 NOTE — Addendum Note (Signed)
Addended by: Eulis Foster on: 06/08/2016 02:07 PM   Modules accepted: Orders

## 2016-06-08 NOTE — Telephone Encounter (Signed)
New message    Laying down and per nurse anemic losing weight, the pt still per nurse is there now   Pt c/o Shortness Of Breath: STAT if SOB developed within the last 24 hours or pt is noticeably SOB on the phone  1. Are you currently SOB (can you hear that pt is SOB on the phone)? Per nurse the pt is sob  2. How long have you been experiencing SOB? About a month  3. Are you SOB when sitting or when up moving around? Per nurse Lying down  4. Are you currently experiencing any other symptoms? Per nurse Generalized  Weak all the time and sob

## 2016-06-08 NOTE — Telephone Encounter (Signed)
Reviewed with Truitt Merle, NP and pt needs STAT BMP, BNP and CBC today. Also needs Chest X-ray. Pt has tentatively been scheduled to see Truitt Merle, NP tomorrow at 2:30. I spoke with [facility] nurse who is currently with pt.  She gave pt information regarding lab work, chest X-ray and tentative appt.  Pt aware appt may be changed based on results of lab work.  Pt will come to our office today for lab work and will go to [facility] at [facility] today for chest X-ray.

## 2016-06-08 NOTE — Telephone Encounter (Signed)
Received direct call from operator and spoke with [place] nurse is seeing pt today for her yearly visit. Nurse reports pt is short of breath when sitting upright. Rales in left base. Pt can't lie flat due to shortness of breath. Is fatigued all the time.  Has had 20 lb weight loss since September. No edema. Shortness of breath has been going on for about a month.  Today is no worse than usual.  Nurse reports pt has history of anemia. No recent lab work.  Nurse is requesting pt be seen in office this week.  Pt last seen here in March 2016 with year follow up planned. Pt has not seen primary care (Dr. York Ram) recently.  I told [facility] nurse I would review with provider in office.  Nurse requests I call pt with appt information.

## 2016-06-09 ENCOUNTER — Encounter: Payer: Self-pay | Admitting: Nurse Practitioner

## 2016-06-09 ENCOUNTER — Ambulatory Visit (INDEPENDENT_AMBULATORY_CARE_PROVIDER_SITE_OTHER): Payer: Medicare Other | Admitting: Nurse Practitioner

## 2016-06-09 ENCOUNTER — Telehealth: Payer: Self-pay | Admitting: Nurse Practitioner

## 2016-06-09 VITALS — BP 126/76 | HR 89 | Ht 62.0 in | Wt 116.4 lb

## 2016-06-09 DIAGNOSIS — I5022 Chronic systolic (congestive) heart failure: Secondary | ICD-10-CM | POA: Diagnosis not present

## 2016-06-09 DIAGNOSIS — I428 Other cardiomyopathies: Secondary | ICD-10-CM

## 2016-06-09 DIAGNOSIS — R0602 Shortness of breath: Secondary | ICD-10-CM

## 2016-06-09 DIAGNOSIS — I429 Cardiomyopathy, unspecified: Secondary | ICD-10-CM | POA: Diagnosis not present

## 2016-06-09 DIAGNOSIS — I10 Essential (primary) hypertension: Secondary | ICD-10-CM | POA: Diagnosis not present

## 2016-06-09 MED ORDER — METOPROLOL TARTRATE 25 MG PO TABS: 25.0000 mg | ORAL_TABLET | Freq: Two times a day (BID) | ORAL | Status: DC

## 2016-06-09 MED ORDER — LOSARTAN POTASSIUM 25 MG PO TABS: 25.0000 mg | ORAL_TABLET | Freq: Every day | ORAL | Status: DC

## 2016-06-09 NOTE — Progress Notes (Signed)
CARDIOLOGY OFFICE NOTE  Date:  06/09/2016    Valerie [facility]s Date of Birth: 29-Jun-1930 Medical Record [id number]  PCP:  Pcp Not In Morton Peters, MD  Cardiologist:  [facility]  Chief Complaint  Patient presents with  . Congestive Heart Failure  . Cardiomyopathy  . Shortness of Breath    Work in visit - seen for [facility]    History of Present Illness: [facility] is a 82 y.o. female who presents today for a work in visit. Seen for [facility].   She has had a history of breast cancer, systolic LV dysfunction due to presumed non-ischemic cardiomyopathy with recovery of her EF.  She was admitted to [facility] 07/26/13 with c/o SOB. She was found to have volume overload with BNP of 13,000. Chest x-ray with mild vascular congestion. Echo 07/27/13 with severe LVH, LVEF 40-45%, moderate AI, moderate MR, moderate TR. She was diuresed and discharged home 07/29/13.  Echo 08/07/14 with LVEF=55-60%, mild to moderate AI and mild MR.   She has not been seen since March of 2016 - felt to be doing well at that time.   Phone call yesterday - "Received direct call from operator and spoke with [facility] nurse. [facility] nurse is seeing pt today for her yearly visit. Nurse reports pt is short of breath when sitting upright. Rales in left base. Pt can't lie flat due to shortness of breath. Is fatigued all the time. Has had 20 lb weight loss since September. No edema. Shortness of breath has been going on for about a month. Today is no worse than usual. Nurse reports pt has history of anemia. No recent lab work. Nurse is requesting pt be seen in office this week. Pt last seen here in March 2016 with year follow up planned. Pt has not seen primary care ([facility]) recently. I told [facility] nurse I would review with provider in office. Nurse requests I call pt with appt information"  Thus added to my schedule.   Comes in today. Here with daughter and grand daughter. She says she is here  because of BP and feeling short of breath. Not clear what the issue with her BP is. No syncope. Occasional dizziness if gets up too fast. She is short of breath with exertion. Worse over the past few months. No cough. No swelling. Not bloated. Has lost a considerable amount of weight since last here. Has not seen her PCP in over a year. Had a transfusion back in 2014 - not really clear who she saw then to get this done. Family notes she eats lots of ice and in general has a poor appetite and her balance is poor. Says her stools look ok.  Her BNP in the past was over 13,000.   Past Medical History  Diagnosis Date  . Breast cancer ([place])   . Cardiomyopathy ([place])   . Aortic insufficiency   . Mitral regurgitation   . Hypertension   . CHF (congestive heart failure) [facility])     Past Surgical History  Procedure Laterality Date  . Mastectomy Left   . Colonoscopy N/A 06/22/2014    Procedure: COLONOSCOPY;  Surgeon: [facility] Bears, MD;  Location: [facility];  Service: [facility];  Laterality: N/A;  . Esophagogastroduodenoscopy N/A 06/22/2014    Procedure: ESOPHAGOGASTRODUODENOSCOPY (EGD);  Surgeon: [facility]erie Bears, MD;  Location: [facility];  Service: [facility];  Laterality: N/A;     Medications: Current Outpatient Prescriptions  Medication Sig Dispense  Refill  . ferrous sulfate 325 (65 FE) MG tablet Take 325 mg by mouth 3 (three) times daily with meals.     Marland Kitchen losartan (COZAAR) 25 MG tablet Take 1 tablet (25 mg total) by mouth daily. 90 tablet 3  . metoprolol tartrate (LOPRESSOR) 25 MG tablet Take 1 tablet (25 mg total) by mouth 2 (two) times daily. 180 tablet 3  . Multiple Vitamins-Minerals (CENTRUM SILVER 50+WOMEN) TABS Take 1 tablet by mouth daily.     No current facility-administered medications for this visit.    Allergies: Allergies  Allergen Reactions  . Penicillins Anaphylaxis and Swelling    Social History: The patient  reports that she quit smoking about 3 years ago. Her smoking use  included Cigarettes. She quit after 50 years of use. Her smokeless tobacco use includes Snuff. She reports that she does not drink alcohol or use illicit drugs.   Family History: The patient's family history includes Hypertension in her mother.   Review of Systems: Please see the history of present illness.   Otherwise, the review of systems is positive for none.   All other systems are reviewed and negative.   Physical Exam: VS:  BP 126/76 mmHg  Pulse 89  Ht 5\' 2"  (1.575 m)  Wt 116 lb 6.4 oz (52.799 kg)  BMI 21.28 kg/m2  SpO2 97% .  BMI Body mass index is 21.28 kg/(m^2).  Wt Readings from Last 3 Encounters:  06/09/16 116 lb 6.4 oz (52.799 kg)  02/26/15 140 lb (63.504 kg)  08/15/14 138 lb (62.596 kg)    General:  Alert. She is elderly black female who looks pale. Looks chronically ill. Her weight is down 24 pounds since last visit here.  HEENT: Normal. Neck: Supple, no JVD, carotid bruits, or masses noted.  Cardiac: Regular rate and rhythm. No murmurs, rubs, or gallops. No edema.  Respiratory:  Lungs are clear to auscultation bilaterally with normal work of breathing.  GI: Soft and nontender.  MS: No deformity or atrophy. Gait and ROM intact. Skin: Warm and dry. Color is normal.  Neuro:  Strength and sensation are intact and no gross focal deficits noted.  Psych: Alert, appropriate and with normal affect.   LABORATORY DATA:  EKG:  EKG is ordered today. This demonstrates NSR with PACs and LVH.  Lab Results  Component Value Date   WBC 5.1 06/08/2016   HGB 8.6* 06/08/2016   HCT 28.1* 06/08/2016   PLT 210 06/08/2016   GLUCOSE 138* 06/08/2016   ALT 11 08/15/2014   AST 24 08/15/2014   NA 139 06/08/2016   K 4.8 06/08/2016   CL 109 06/08/2016   CREATININE 1.04* 06/08/2016   BUN 22* 06/08/2016   CO2 24 06/08/2016   TSH 0.510 08/15/2014   INR 1.07 08/15/2014    BNP (last 3 results)  Recent Labs  06/08/16 1547  BNP 1011.3*    ProBNP (last 3 results) No results  for input(s): PROBNP in the last 8760 hours.   Other Studies Reviewed Today:  CXR FINDINGS: Mediastinum hilar structures normal. Cardiomegaly with normal pulmonary vascularity. No focal pulmonary infiltrate. No pleural effusion or pneumothorax. Left mastectomy. Surgical clips left axilla. Degenerative changes thoracic spine.  IMPRESSION: 1. Cardiomegaly. No pulmonary venous congestion.  2. No acute pulmonary disease.  3. Left mastectomy.   Electronically Signed  By: Marcello Moores Register  On: 06/08/2016 14:51    Echo Study Conclusions from 07/2014  - Left ventricle: Severe basal septal hypertrophy No obvious SAM, LVOT gradient or  subvalvular web. The cavity size was normal. Systolic function was normal. The estimated ejection fraction was in the range of 55% to 60%. - Aortic valve: There was mild to moderate regurgitation. - Aorta: Aortic root appears dilated but poorly seen Consider f/u MRA/CT for better measurement. - Mitral valve: Calcified annulus. Mildly thickened leaflets . There was mild regurgitation. - Left atrium: The atrium was mildly dilated. - Atrial septum: No defect or patent foramen ovale was identified. - Pulmonary arteries: PA peak pressure: 34 mm Hg (S).  Assessment/Plan: 1. Non-ischemic Cardiomyopathy: LV systolic function has improved on good medical therapy. Last LVEF=55-60% by echo August 2015. She has no known obstructive CAD. Her NICM was likely due to hypertension with severe LVH on echo. Will continue beta blocker and ARB. She did not tolerate Ace-inh due to cough. Now with more dyspnea - will get echo updated.   2. Dyspnea - I do not think her symptoms are from heart failure - she has lost 24 pounds since last visit. No swelling. I think her symptoms are from her anemia/weight loss. CXR was ok. BNP yesterday was 1000 but is improved from 13000. I have asked her to see her PCP tomorrow. I suspect she needs to be referred to  hematology.   2. Chronic systolic CHF: EF has improved. Weight is down considerably. No longer on any diuretics and I really do not think she needs at this time.   3. Valvular heart disease - updating her echo  4. HTN - BP ok on her current regimen. Medicines refilled today.  5. Weight loss - I worry about possible malignancy - she needs a weight loss evaluation. She says she will see her PCP tomorrow.           Current medicines are reviewed with the patient today.  The patient does not have concerns regarding medicines other than what has been noted above.  The following changes have been made:  See above.  Labs/ tests ordered today include:    Orders Placed This Encounter  Procedures  . EKG 12-Lead  . ECHOCARDIOGRAM COMPLETE     Disposition:   F   Patient is agreeable to this plan and will call if any problems develop in the interim.   Signed: Burtis Junes, RN, ANP-C 06/09/2016 3:00 PM  [facility] [address] [place], [place]  [address] Phone: [phone] Fax: [phone]

## 2016-06-09 NOTE — Telephone Encounter (Signed)
New message     Talk to Fraser Din again about this pt.  She is a pt of Dr Angelena Form

## 2016-06-09 NOTE — Patient Instructions (Addendum)
We will be checking the following labs today - NONE   Medication Instructions:    Continue with your current medicines.     Testing/Procedures To Be Arranged:  Echocardiogram  Follow-Up:   See Dr. Jimmye Norman in his walk in clinic about your anemia    Other Special Instructions:   N/A    If you need a refill on your cardiac medications before your next appointment, please call your pharmacy.   Call the [place] office at [phone] if you have any questions, problems or concerns.

## 2016-06-09 NOTE — Telephone Encounter (Signed)
Spoke with [facility] home health nurse and updated her on office visit today.

## 2016-06-09 NOTE — Telephone Encounter (Signed)
Pt saw Kathrene Alu today

## 2016-06-09 NOTE — Telephone Encounter (Signed)
BNP elevated.  Pt will need to keep appt with Truitt Merle, NP today.  I placed call to pt and left message to call back

## 2016-06-09 NOTE — Telephone Encounter (Signed)
I have tried 3 times today to reach pt to let her know she needs to keep appt today.  Message has been left on home number to call office.  Cell number rings a fast busy.

## 2016-06-24 DIAGNOSIS — R5383 Other fatigue: Secondary | ICD-10-CM | POA: Diagnosis not present

## 2016-06-24 DIAGNOSIS — I10 Essential (primary) hypertension: Secondary | ICD-10-CM | POA: Diagnosis not present

## 2016-06-30 ENCOUNTER — Ambulatory Visit (HOSPITAL_COMMUNITY): Payer: Medicare Other | Attending: Cardiovascular Disease

## 2016-06-30 DIAGNOSIS — R0989 Other specified symptoms and signs involving the circulatory and respiratory systems: Secondary | ICD-10-CM

## 2016-07-06 ENCOUNTER — Emergency Department (HOSPITAL_COMMUNITY): Payer: Medicare Other

## 2016-07-06 ENCOUNTER — Observation Stay (HOSPITAL_COMMUNITY)
Admission: EM | Admit: 2016-07-06 | Discharge: 2016-07-08 | Disposition: A | Discharge status: Home / Self Care | Payer: Medicare Other | Attending: Internal Medicine | Admitting: Internal Medicine

## 2016-07-06 ENCOUNTER — Observation Stay (HOSPITAL_COMMUNITY): Payer: Medicare Other

## 2016-07-06 ENCOUNTER — Encounter (HOSPITAL_COMMUNITY): Payer: Self-pay

## 2016-07-06 DIAGNOSIS — R079 Chest pain, unspecified: Principal | ICD-10-CM | POA: Insufficient documentation

## 2016-07-06 DIAGNOSIS — R6881 Early satiety: Secondary | ICD-10-CM | POA: Diagnosis not present

## 2016-07-06 DIAGNOSIS — I10 Essential (primary) hypertension: Secondary | ICD-10-CM | POA: Diagnosis not present

## 2016-07-06 DIAGNOSIS — I110 Hypertensive heart disease with heart failure: Secondary | ICD-10-CM | POA: Diagnosis not present

## 2016-07-06 DIAGNOSIS — R072 Precordial pain: Secondary | ICD-10-CM

## 2016-07-06 DIAGNOSIS — R634 Abnormal weight loss: Secondary | ICD-10-CM | POA: Insufficient documentation

## 2016-07-06 DIAGNOSIS — Z853 Personal history of malignant neoplasm of breast: Secondary | ICD-10-CM | POA: Diagnosis not present

## 2016-07-06 DIAGNOSIS — I5022 Chronic systolic (congestive) heart failure: Secondary | ICD-10-CM | POA: Diagnosis not present

## 2016-07-06 DIAGNOSIS — D509 Iron deficiency anemia, unspecified: Secondary | ICD-10-CM | POA: Insufficient documentation

## 2016-07-06 DIAGNOSIS — I429 Cardiomyopathy, unspecified: Secondary | ICD-10-CM | POA: Diagnosis not present

## 2016-07-06 DIAGNOSIS — N838 Other noninflammatory disorders of ovary, fallopian tube and broad ligament: Secondary | ICD-10-CM

## 2016-07-06 DIAGNOSIS — R9431 Abnormal electrocardiogram [ECG] [EKG]: Secondary | ICD-10-CM | POA: Diagnosis present

## 2016-07-06 DIAGNOSIS — Z9012 Acquired absence of left breast and nipple: Secondary | ICD-10-CM | POA: Diagnosis not present

## 2016-07-06 DIAGNOSIS — Z6821 Body mass index (BMI) 21.0-21.9, adult: Secondary | ICD-10-CM | POA: Diagnosis not present

## 2016-07-06 DIAGNOSIS — N839 Noninflammatory disorder of ovary, fallopian tube and broad ligament, unspecified: Secondary | ICD-10-CM | POA: Insufficient documentation

## 2016-07-06 DIAGNOSIS — F17290 Nicotine dependence, other tobacco product, uncomplicated: Secondary | ICD-10-CM | POA: Diagnosis not present

## 2016-07-06 DIAGNOSIS — Z87891 Personal history of nicotine dependence: Secondary | ICD-10-CM | POA: Diagnosis not present

## 2016-07-06 DIAGNOSIS — I491 Atrial premature depolarization: Secondary | ICD-10-CM | POA: Insufficient documentation

## 2016-07-06 DIAGNOSIS — R0789 Other chest pain: Secondary | ICD-10-CM | POA: Diagnosis not present

## 2016-07-06 DIAGNOSIS — Z79899 Other long term (current) drug therapy: Secondary | ICD-10-CM | POA: Insufficient documentation

## 2016-07-06 DIAGNOSIS — M549 Dorsalgia, unspecified: Secondary | ICD-10-CM | POA: Diagnosis not present

## 2016-07-06 DIAGNOSIS — N289 Disorder of kidney and ureter, unspecified: Secondary | ICD-10-CM | POA: Insufficient documentation

## 2016-07-06 LAB — I-STAT TROPONIN, ED: Troponin i, poc: 0.03 ng/mL (ref 0.00–0.08)

## 2016-07-06 LAB — LIPID PANEL
CHOL/HDL RATIO: 2.6 ratio
Cholesterol: 101 mg/dL (ref 0–200)
HDL: 39 mg/dL — AB (ref >40)
LDL CALC: 49 mg/dL (ref 0–99)
Triglycerides: 66 mg/dL (ref <150)
VLDL: 13 mg/dL (ref 0–40)

## 2016-07-06 LAB — IRON AND TIBC
Iron: 21 ug/dL — ABNORMAL LOW (ref 28–170)
Saturation Ratios: 8 % — ABNORMAL LOW (ref 10.4–31.8)
TIBC: 258 ug/dL (ref 250–450)
UIBC: 237 ug/dL

## 2016-07-06 LAB — BASIC METABOLIC PANEL
Anion gap: 5 (ref 5–15)
BUN: 26 mg/dL — AB (ref 6–20)
CHLORIDE: 116 mmol/L — AB (ref 101–111)
CO2: 19 mmol/L — AB (ref 22–32)
CREATININE: 0.93 mg/dL (ref 0.44–1.00)
Calcium: 10.1 mg/dL (ref 8.9–10.3)
GFR calc Af Amer: >60 mL/min (ref >60)
GFR calc non Af Amer: 55 mL/min — ABNORMAL LOW (ref >60)
Glucose, Bld: 111 mg/dL — ABNORMAL HIGH (ref 65–99)
POTASSIUM: 4.8 mmol/L (ref 3.5–5.1)
Sodium: 140 mmol/L (ref 135–145)

## 2016-07-06 LAB — FERRITIN: FERRITIN: 41 ng/mL (ref 11–307)

## 2016-07-06 LAB — RETICULOCYTES
RBC.: 3.23 MIL/uL — ABNORMAL LOW (ref 3.87–5.11)
RETIC CT PCT: 0.5 % (ref 0.4–3.1)
Retic Count, Absolute: 16.2 10*3/uL — ABNORMAL LOW (ref 19.0–186.0)

## 2016-07-06 LAB — TROPONIN I: TROPONIN I: 0.05 ng/mL — AB (ref <0.03)

## 2016-07-06 LAB — CBC
HEMATOCRIT: 26.1 % — AB (ref 36.0–46.0)
Hemoglobin: 8.6 g/dL — ABNORMAL LOW (ref 12.0–15.0)
MCH: 28.1 pg (ref 26.0–34.0)
MCHC: 33 g/dL (ref 30.0–36.0)
MCV: 85.3 fL (ref 78.0–100.0)
PLATELETS: 225 10*3/uL (ref 150–400)
RBC: 3.06 MIL/uL — AB (ref 3.87–5.11)
RDW: 15.3 % (ref 11.5–15.5)
WBC: 9.1 10*3/uL (ref 4.0–10.5)

## 2016-07-06 LAB — FOLATE: Folate: 25.6 ng/mL (ref >5.9)

## 2016-07-06 LAB — TSH: TSH: 0.038 u[IU]/mL — AB (ref 0.350–4.500)

## 2016-07-06 LAB — BRAIN NATRIURETIC PEPTIDE: B Natriuretic Peptide: 1416.9 pg/mL — ABNORMAL HIGH (ref 0.0–100.0)

## 2016-07-06 LAB — VITAMIN B12: Vitamin B-12: 238 pg/mL (ref 180–914)

## 2016-07-06 MED ORDER — CENTRUM SILVER 50+WOMEN PO TABS: 1.0000 | ORAL_TABLET | Freq: Every day | ORAL | Status: DC

## 2016-07-06 MED ORDER — ACETAMINOPHEN 325 MG PO TABS
650.0000 mg | ORAL_TABLET | ORAL | Status: DC | PRN
  Administered 2016-07-08: 650 mg via ORAL
  Filled 2016-07-06: qty 2

## 2016-07-06 MED ORDER — GI COCKTAIL ~~LOC~~: 30.0000 mL | Freq: Four times a day (QID) | ORAL | Status: DC | PRN

## 2016-07-06 MED ORDER — PANTOPRAZOLE SODIUM 40 MG PO TBEC
40.0000 mg | DELAYED_RELEASE_TABLET | Freq: Every day | ORAL | Status: DC
  Administered 2016-07-07 – 2016-07-08 (×2): 40 mg via ORAL
  Filled 2016-07-06 (×2): qty 1

## 2016-07-06 MED ORDER — FERROUS SULFATE 325 (65 FE) MG PO TABS
325.0000 mg | ORAL_TABLET | Freq: Every day | ORAL | Status: DC
  Administered 2016-07-06 – 2016-07-07 (×2): 325 mg via ORAL
  Filled 2016-07-06 (×2): qty 1

## 2016-07-06 MED ORDER — ENOXAPARIN SODIUM 40 MG/0.4ML ~~LOC~~ SOLN
40.0000 mg | SUBCUTANEOUS | Status: DC
  Administered 2016-07-06 – 2016-07-07 (×2): 40 mg via SUBCUTANEOUS
  Filled 2016-07-06 (×2): qty 0.4

## 2016-07-06 MED ORDER — NITROGLYCERIN 0.4 MG SL SUBL
0.4000 mg | SUBLINGUAL_TABLET | SUBLINGUAL | Status: DC | PRN
Start: 2016-07-06 — End: 2016-07-08

## 2016-07-06 MED ORDER — FUROSEMIDE 10 MG/ML IJ SOLN
40.0000 mg | Freq: Once | INTRAMUSCULAR | Status: AC
  Administered 2016-07-06: 40 mg via INTRAVENOUS
  Filled 2016-07-06: qty 4

## 2016-07-06 MED ORDER — ASPIRIN EC 81 MG PO TBEC
81.0000 mg | DELAYED_RELEASE_TABLET | Freq: Every day | ORAL | Status: DC
  Administered 2016-07-06 – 2016-07-08 (×3): 81 mg via ORAL
  Filled 2016-07-06 (×3): qty 1

## 2016-07-06 MED ORDER — ONDANSETRON HCL 4 MG/2ML IJ SOLN: 4.0000 mg | Freq: Four times a day (QID) | INTRAMUSCULAR | Status: DC | PRN

## 2016-07-06 MED ORDER — ADULT MULTIVITAMIN W/MINERALS CH
1.0000 | ORAL_TABLET | Freq: Every day | ORAL | Status: DC
  Administered 2016-07-07 – 2016-07-08 (×2): 1 via ORAL
  Filled 2016-07-06 (×2): qty 1

## 2016-07-06 MED ORDER — METOPROLOL TARTRATE 25 MG PO TABS
25.0000 mg | ORAL_TABLET | Freq: Two times a day (BID) | ORAL | Status: DC
  Administered 2016-07-06 – 2016-07-08 (×4): 25 mg via ORAL
  Filled 2016-07-06 (×4): qty 1

## 2016-07-06 MED ORDER — FENTANYL CITRATE (PF) 100 MCG/2ML IJ SOLN
50.0000 ug | Freq: Once | INTRAMUSCULAR | Status: AC
  Administered 2016-07-06: 50 ug via INTRAVENOUS
  Filled 2016-07-06: qty 2

## 2016-07-06 MED ORDER — IOPAMIDOL (ISOVUE-370) INJECTION 76%
100.0000 mL | Freq: Once | INTRAVENOUS | Status: AC | PRN
  Administered 2016-07-06: 80 mL via INTRAVENOUS

## 2016-07-06 MED ORDER — MORPHINE SULFATE (PF) 2 MG/ML IV SOLN: 1.0000 mg | INTRAVENOUS | Status: DC | PRN

## 2016-07-06 NOTE — Progress Notes (Signed)
CRITICAL VALUE ALERT  Critical value received: troponin 0.05  Date of notification:  07/06/16  Time of notification:  2042  Critical value read back:Yes.    Nurse who received alert:  Virgina Norfolk   MD notified (1st page):  Baltazar Najjar  Time of first page:  2044  MD notified (2nd page):  Time of second page:  Responding MD:  Baltazar Najjar   Time MD responded:  2050

## 2016-07-06 NOTE — H&P (Signed)
History and Physical  Valerie [facility]s [id number] DOB: 02-14-1930 DOA: 07/06/2016  Referring physician: Yvone Neu, MD PCP: Harvie Junior, MD   Chief Complaint: Sudden chest pain   HPI: [facility] is a 82 y.o. female with PMH of HTN, tobacco use history, and previous AKI who presents to the emergency department for evaluation of sudden onset back, chest, abdominal discomfort at approximately 6 AM today the persisted throughout the day.  Pt has had no shortness of breath or palpitations. She states it began suddenly after eating breakfast. Denies any injury. Denies any numbness or tingling in the hands or feet. She has never had a similar pain before. She reports that the discomfort felt as if it was traveling through her back and her abdomen. She denies any associated shortness of breath, vomiting, diarrhea. At this time she is mostly pain-free. She also notes a heaviness in her arms and legs bilaterally. She is not taken any over-the-counter medication for the discomfort.  EMS reports a fever in the field. Patient states she has not had any recent illness. She is being admitted for chest pain and sudden onset chest and back pain.   Past Medical History  Diagnosis Date  . Breast cancer ([place])   . Cardiomyopathy ([place])   . Aortic insufficiency   . Mitral regurgitation   . Hypertension   . CHF (congestive heart failure) [facility])    Past Surgical History  Procedure Laterality Date  . Mastectomy Left   . Colonoscopy N/A 06/22/2014    Procedure: COLONOSCOPY;  Surgeon: Jerene Bears, MD;  Location: [facility];  Service: Endoscopy;  Laterality: N/A;  . Esophagogastroduodenoscopy N/A 06/22/2014    Procedure: ESOPHAGOGASTRODUODENOSCOPY (EGD);  Surgeon: Jerene Bears, MD;  Location: [facility];  Service: Endoscopy;  Laterality: N/A;   Social History:  reports that she quit smoking about 4 years ago. Her smoking use included Cigarettes. She quit after 50 years of use. Her smokeless tobacco  use includes Snuff. She reports that she does not drink alcohol or use illicit drugs.   Allergies  Allergen Reactions  . Penicillins Anaphylaxis and Swelling    Has patient had a PCN reaction causing immediate rash, facial/tongue/throat swelling, SOB or lightheadedness with hypotension: Yes Has patient had a PCN reaction causing severe rash involving mucus membranes or skin necrosis: Yes Has patient had a PCN reaction that required hospitalization Yes Has patient had a PCN reaction occurring within the last 10 years: No If all of the above answers are "NO", then may proceed with Cephalosporin use.     Family History  Problem Relation Age of Onset  . Hypertension Mother    Review of Systems  Constitutional: No fever/chills Eyes: No visual changes. EN T: No sore throat. Cardiovascular: Positive chest pain and back pain.  Respiratory: Denies shortness of breath. Gastrointestinal: No abdominal pain. No nausea, no vomiting. No diarrhea. No constipation. Genitourinary: Negative for dysuria. Musculoskeletal: Positive for back pain. Skin: Negative for rash. Neurological: Negative for headaches, focal weakness or numbness. 10-point ROS otherwise negative.  Prior to Admission medications   Medication Sig Start Date End Date Taking? Authorizing Provider  ferrous sulfate 325 (65 FE) MG tablet Take 325 mg by mouth at bedtime.    Yes Historical Provider, MD  metoprolol tartrate (LOPRESSOR) 25 MG tablet Take 1 tablet (25 mg total) by mouth 2 (two) times daily. 06/09/16  Yes Burtis Junes, NP  Multiple Vitamins-Minerals (CENTRUM SILVER 50+WOMEN) TABS Take 1 tablet by  mouth daily.   Yes Historical Provider, MD   Physical Exam: Filed Vitals:   07/06/16 1500 07/06/16 1538 07/06/16 1608 07/06/16 1638  BP: 129/67 120/85 142/79 138/64  Pulse: 95 94 91 85  Temp:      Resp: 15 20 17 25   SpO2: 96% 96% 90% 95%    Constitutional: Alert and oriented. Well appearing and in no acute distress.  Chronically ill appearing and emaciate.  Eyes: Conjunctivae are normal. PERRL. EOMI. Head: Atraumatic. Normocephalic.  Nose: No congestion/rhinnorhea. Mouth/Throat: Mucous membranes are moist. Oropharynx non-erythematous. Neck: No stridor. No meningeal signs. No cervical spine tenderness to palpation. Cardiovascular: Normal rate, regular rhythm. Good peripheral circulation with equal pulses throughout. Grossly normal heart sounds.  Respiratory: Normal respiratory effort. No retractions. Lungs CTAB. Gastrointestinal: Soft and nontender. No distention.  Musculoskeletal: No lower extremity tenderness nor edema. No gross deformities of extremities. Neurologic: Normal speech and language. No gross focal neurologic deficits are appreciated.  Skin: Skin is warm, dry and intact. No rash noted. Psychiatric: Mood and affect are normal. Speech and behavior are normal..  Labs on Admission:  Basic Metabolic Panel:  Recent Labs Lab 07/06/16 1426  NA 140  K 4.8  CL 116*  CO2 19*  GLUCOSE 111*  BUN 26*  CREATININE 0.93  CALCIUM 10.1   Liver Function Tests: No results for input(s): AST, ALT, ALKPHOS, BILITOT, PROT, ALBUMIN in the last 168 hours. No results for input(s): LIPASE, AMYLASE in the last 168 hours. No results for input(s): AMMONIA in the last 168 hours. CBC:  Recent Labs Lab 07/06/16 1426  WBC 9.1  HGB 8.6*  HCT 26.1*  MCV 85.3  PLT 225   Cardiac Enzymes: No results for input(s): CKTOTAL, CKMB, CKMBINDEX, TROPONINI in the last 168 hours.  BNP (last 3 results) No results for input(s): PROBNP in the last 8760 hours. CBG: No results for input(s): GLUCAP in the last 168 hours.  Radiological Exams on Admission: Dg Chest 2 View  07/06/2016  CLINICAL DATA:  Chest and back pain.  Fever. EXAM: CHEST  2 VIEW COMPARISON:  PA and lateral chest 06/08/2016 and 07/26/2013. FINDINGS: Postoperative change of left axillary dissection is again seen. Mild atelectasis is seen in the  periphery of the right lung base. The lungs are otherwise clear. Heart size is upper normal. No pneumothorax or pleural effusion. No focal bony abnormality. IMPRESSION: No acute disease. Electronically Signed   By: Inge Rise M.D.   On: 07/06/2016 14:45    EKG: Independently reviewed.  Assessment/Plan Active Problems:   Chest pain   Abnormal EKG   HYPERTENSION, BENIGN SYSTEMIC   Iron deficiency anemia   Chronic systolic CHF (congestive heart failure) ([place])  1. Sudden onset chest pain - concerning for dissection, check CTA to rule out PE and dissection.  EKG reviewed, no acute ischemic changes.  Cycle troponin to rule out cardiac ischemia. Cardiology following.  See orders.   2. History of non-ischemic cardiomyopathy - check echocardiogram.   3. Hypertension - fairly well controlled and will continue to monitor.  Resume home meds.   4. Chronic iron deficiency anemia - continue daily iron supplement.  Follow CBC. Transfuse for Hg less than 7.     DVT Prophylaxis: enoxaparin Code Status: Full  Family Communication: bedside  Disposition Plan: TBD  Irwin Brakeman, MD [facility] Pager [phone]  If 7PM-7AM, please contact night-coverage [email or web address] Password [facility] 07/06/2016, 4:41 PM

## 2016-07-06 NOTE — ED Provider Notes (Signed)
Emergency Department Provider Note  Time seen: Approximately 3:14 PM  I have reviewed the triage vital signs and the nursing notes.   HISTORY  Chief Complaint Chest Pain   HPI Valerie Downs is a 82 y.o. female with PMH of HTN, tobacco use history, and prior AKI now resolved presents to the emergency department for evaluation of sudden onset back, chest, abdominal discomfort at approximately 6 AM today. She states it began suddenly after eating breakfast. Denies any injury. Denies any numbness or tingling in the hands or feet. She has never had a similar pain before. She reports that the discomfort felt as if it was traveling through her back and her abdomen. She denies any associated shortness of breath, vomiting, diarrhea. At this time she is mostly pain-free. She also notes a heaviness in her arms and legs bilaterally. She is not taken any over-the-counter medication for the discomfort.  EMS reports a fever in the field. Patient states she has not had any obvious signs or symptoms to suggest underlying illness.    Past Medical History  Diagnosis Date  . Breast cancer ([place])   . Cardiomyopathy ([place])   . Aortic insufficiency   . Mitral regurgitation   . Hypertension   . CHF (congestive heart failure) [facility])     Patient Active Problem List   Diagnosis Date Noted  . AKI (acute kidney injury) ([place]) 08/16/2014  . GI bleeding 08/15/2014  . Rectal bleed 08/15/2014  . Heme positive stool 06/22/2014  . Symptomatic anemia 06/21/2014  . Iron deficiency anemia 06/21/2014  . Chronic systolic CHF (congestive heart failure) ([place]) 06/21/2014  . GI bleed 06/21/2014  . Acute systolic CHF (congestive heart failure) ([employer]) 07/29/2013  . Malnutrition of moderate degree ([place]) 07/28/2013  . UTI (lower urinary tract infection) 07/26/2013  . Acute decompensated heart failure ([place]) 07/26/2013  . HYPERTENSION, BENIGN SYSTEMIC 02/23/2007    Past Surgical History  Procedure Laterality Date  .  Mastectomy Left   . Colonoscopy N/A 06/22/2014    Procedure: COLONOSCOPY;  Surgeon: Jerene Bears, MD;  Location: [facility] ENDOSCOPY;  Service: Endoscopy;  Laterality: N/A;  . Esophagogastroduodenoscopy N/A 06/22/2014    Procedure: ESOPHAGOGASTRODUODENOSCOPY (EGD);  Surgeon: Jerene Bears, MD;  Location: [facility];  Service: Endoscopy;  Laterality: N/A;    Current Outpatient Rx  Name  Route  Sig  Dispense  Refill  . ferrous sulfate 325 (65 FE) MG tablet   Oral   Take 325 mg by mouth at bedtime.          . metoprolol tartrate (LOPRESSOR) 25 MG tablet   Oral   Take 1 tablet (25 mg total) by mouth 2 (two) times daily.   180 tablet   3   . Multiple Vitamins-Minerals (CENTRUM SILVER 50+WOMEN) TABS   Oral   Take 1 tablet by mouth daily.         Marland Kitchen losartan (COZAAR) 25 MG tablet   Oral   Take 1 tablet (25 mg total) by mouth daily. Patient not taking: Reported on 07/06/2016   90 tablet   3     Allergies Penicillins  Family History  Problem Relation Age of Onset  . Hypertension Mother     Social History Social History  Substance Use Topics  . Smoking status: Former Smoker -- 50 years    Types: Cigarettes    Quit date: 06/25/2012  . Smokeless tobacco: Current User    Types: Snuff  . Alcohol Use: No  Review of Systems  Constitutional: No fever/chills Eyes: No visual changes. ENT: No sore throat. Cardiovascular: Positive chest pain and back pain.  Respiratory: Denies shortness of breath. Gastrointestinal: No abdominal pain.  No nausea, no vomiting.  No diarrhea.  No constipation. Genitourinary: Negative for dysuria. Musculoskeletal: Positive for back pain. Skin: Negative for rash. Neurological: Negative for headaches, focal weakness or numbness.  10-point ROS otherwise negative.  ____________________________________________   PHYSICAL EXAM:  VITAL SIGNS: ED Triage Vitals  Enc Vitals Group     BP 07/06/16 1500 129/67 mmHg     Pulse Rate 07/06/16 1500 95      Resp 07/06/16 1432 16     Temp 07/06/16 1432 98.4 F (36.9 C)     SpO2 07/06/16 1432 96 %     Pain Score 07/06/16 1426 4   Constitutional: Alert and oriented. Well appearing and in no acute distress. Eyes: Conjunctivae are normal. PERRL. EOMI. Head: Atraumatic. Nose: No congestion/rhinnorhea. Mouth/Throat: Mucous membranes are moist.  Oropharynx non-erythematous. Neck: No stridor.  No meningeal signs. No cervical spine tenderness to palpation. Cardiovascular: Normal rate, regular rhythm. Good peripheral circulation with equal pulses throughout. Grossly normal heart sounds.   Respiratory: Normal respiratory effort.  No retractions. Lungs CTAB. Gastrointestinal: Soft and nontender. No distention.  Musculoskeletal: No lower extremity tenderness nor edema. No gross deformities of extremities. Neurologic:  Normal speech and language. No gross focal neurologic deficits are appreciated.  Skin:  Skin is warm, dry and intact. No rash noted. Psychiatric: Mood and affect are normal. Speech and behavior are normal.  ____________________________________________   LABS (all labs ordered are listed, but only abnormal results are displayed)  Labs Reviewed  BASIC METABOLIC PANEL - Abnormal; Notable for the following:    Chloride 116 (*)    CO2 19 (*)    Glucose, Bld 111 (*)    BUN 26 (*)    GFR calc non Af Amer 55 (*)    All other components within normal limits  CBC - Abnormal; Notable for the following:    RBC 3.06 (*)    Hemoglobin 8.6 (*)    HCT 26.1 (*)    All other components within normal limits  I-STAT TROPOININ, ED   ____________________________________________  EKG  Reviewed in MUSE. ST depressions noted laterally. No elevations.  ____________________________________________  RADIOLOGY  Dg Chest 2 View  07/06/2016  CLINICAL DATA:  Chest and back pain.  Fever. EXAM: CHEST  2 VIEW COMPARISON:  PA and lateral chest 06/08/2016 and 07/26/2013. FINDINGS: Postoperative change of  left axillary dissection is again seen. Mild atelectasis is seen in the periphery of the right lung base. The lungs are otherwise clear. Heart size is upper normal. No pneumothorax or pleural effusion. No focal bony abnormality. IMPRESSION: No acute disease. Electronically Signed   By: Inge Rise M.D.   On: 07/06/2016 14:45    ____________________________________________   PROCEDURES  Procedure(s) performed:   Procedures  None ____________________________________________   INITIAL IMPRESSION / ASSESSMENT AND PLAN / ED COURSE  Pertinent labs & imaging results that were available during my care of the patient were reviewed by me and considered in my medical decision making (see chart for details).  Patient resents to the emergency department for evaluation of sudden onset chest pain radiating down into her abdomen. She has equal pulses bilaterally. Pain is diminished at this time. Low suspicion for aortic dissection or associated injury. Patient does have significant cardiac risk factors and EKG changes noted above. Primary concern is  for ACS in this patient. CXR reviewed. She was given ASA and nitro en route with EMS. Afebrile here in the ED with no signs/symptoms to suggest underlying infection. Will repeat EKG now to assess for any dynamic changes.   Discussed EKG with Cardiology who do not feel strongly at this time that the ST depression represent ischemia. Will admit for chest pain observation and biomarker trending. Appreciate Cardiology assistance.   04:26 PM Discussed patient's case with hospitalist, Dr. Wynetta Emery.  Recommend admission to observation, telemetry bed.  I will place holding orders per their request. Patient and family (if present) updated with plan. Care transferred to hospitalist service.  I reviewed all nursing notes, vitals, pertinent old records, EKGs, labs, imaging (as available).   ____________________________________________  FINAL CLINICAL  IMPRESSION(S) / ED DIAGNOSES  Final diagnoses:  Chest pain, unspecified chest pain type  Chronic systolic CHF (congestive heart failure) (HCC)  Chest pain     MEDICATIONS GIVEN DURING THIS VISIT:  Medications  metoprolol tartrate (LOPRESSOR) tablet 25 mg (25 mg Oral Given 07/06/16 2324)  ferrous sulfate tablet 325 mg (325 mg Oral Given 07/06/16 2324)  acetaminophen (TYLENOL) tablet 650 mg (not administered)  ondansetron (ZOFRAN) injection 4 mg (not administered)  enoxaparin (LOVENOX) injection 40 mg (40 mg Subcutaneous Given 07/06/16 2003)  morphine 2 MG/ML injection 1 mg (not administered)  gi cocktail (Maalox,Lidocaine,Donnatal) (not administered)  aspirin EC tablet 81 mg (81 mg Oral Given 07/06/16 2003)  nitroGLYCERIN (NITROSTAT) SL tablet 0.4 mg (not administered)  pantoprazole (PROTONIX) EC tablet 40 mg (40 mg Oral Given 07/07/16 0501)  multivitamin with minerals tablet 1 tablet (not administered)  fentaNYL (SUBLIMAZE) injection 50 mcg (50 mcg Intravenous Given 07/06/16 1600)  iopamidol (ISOVUE-370) 76 % injection 100 mL (80 mLs Intravenous Contrast Given 07/06/16 1717)  furosemide (LASIX) injection 40 mg (40 mg Intravenous Given 07/06/16 2324)     NEW OUTPATIENT MEDICATIONS STARTED DURING THIS VISIT:  None    Note:  This document was prepared using Dragon voice recognition software and may include unintentional dictation errors.  Nanda Quinton, MD Emergency Medicine  Margette Fast, MD 07/07/16 1055

## 2016-07-06 NOTE — ED Notes (Signed)
BIB EMS c/o chest pain, back pain, fever of 101.1 tympanic. Pt has had x3 nitro sublingual. Pt reports relief at 4/10 from 10/10.

## 2016-07-06 NOTE — ED Notes (Signed)
Bed: WA20 Expected date:  Expected time:  Means of arrival:  Comments: EMS- aspirin nitro on board

## 2016-07-06 NOTE — Consult Note (Signed)
CONSULTATION NOTE  Reason for Consult: Chest pain  Requesting Physician: Dr. Laverta Baltimore  Cardiologist: Dr. Angelena Form  HPI: This is a 82 y.o. female with a past medical history significant for breast cancer, systolic LV dysfunction due to presumed non-ischemic cardiomyopathy with recovery of her EF. She was admitted to [facility] 07/26/13 with c/o SOB. She was found to have volume overload with BNP of 13,000. Chest x-ray with mild vascular congestion. Echo 07/27/13 with severe LVH, LVEF 40-45%, moderate AI, moderate MR, moderate TR. She was diuresed and discharged home 07/29/13. Echo 08/07/14 with LVEF=55-60%, mild to moderate AI and mild MR. She has not been seen since March of 2016 - felt to be doing well at that time. Recently seen by Truitt Merle, NP on 06/09/2016 for dyspnea, thought to be CHF. However, exam and work-up was not consistent with this. It was also noted that the patient had significant weight loss. EKG shows high voltage suggestive of LVH with lateral downsloping ST depression suggestive of repolarization abnormality.   She now presents with chest pain. This started this morning after breakfast and was associated with back and abdominal discomfort as well. There was report of fever to 101F by EMS, however, no fever is noted here. She also reported heaviness in her arms and legs. EKG shows sinus rhythm with LVH and lateral repolarization abnormality. Cardiology is asked to evaluate for chest pain which is thought to be concerning for ACS. Initial troponin was negative at 0.03.  PMHx:  Past Medical History  Diagnosis Date  . Breast cancer ([place])   . Cardiomyopathy ([place])   . Aortic insufficiency   . Mitral regurgitation   . Hypertension   . CHF (congestive heart failure) [facility])    Past Surgical History  Procedure Laterality Date  . Mastectomy Left   . Colonoscopy N/A 06/22/2014    Procedure: COLONOSCOPY;  Surgeon: Jerene Bears, MD;  Location: [facility];  Service:  Endoscopy;  Laterality: N/A;  . Esophagogastroduodenoscopy N/A 06/22/2014    Procedure: ESOPHAGOGASTRODUODENOSCOPY (EGD);  Surgeon: Jerene Bears, MD;  Location: [facility];  Service: Endoscopy;  Laterality: N/A;    FAMHx: Family History  Problem Relation Age of Onset  . Hypertension Mother     SOCHx:  reports that she quit smoking about 4 years ago. Her smoking use included Cigarettes. She quit after 50 years of use. Her smokeless tobacco use includes Snuff. She reports that she does not drink alcohol or use illicit drugs.  ALLERGIES: Allergies  Allergen Reactions  . Penicillins Anaphylaxis and Swelling    Has patient had a PCN reaction causing immediate rash, facial/tongue/throat swelling, SOB or lightheadedness with hypotension: Yes Has patient had a PCN reaction causing severe rash involving mucus membranes or skin necrosis: Yes Has patient had a PCN reaction that required hospitalization Yes Has patient had a PCN reaction occurring within the last 10 years: No If all of the above answers are "NO", then may proceed with Cephalosporin use.     ROS: Pertinent items noted in HPI and remainder of comprehensive ROS otherwise negative.  HOME MEDICATIONS:   Medication List    ASK your doctor about these medications        CENTRUM SILVER 50+WOMEN Tabs  Take 1 tablet by mouth daily.     ferrous sulfate 325 (65 FE) MG tablet  Take 325 mg by mouth at bedtime.     losartan 25 MG tablet  Commonly known as:  COZAAR  Take 1 tablet (  25 mg total) by mouth daily.     metoprolol tartrate 25 MG tablet  Commonly known as:  LOPRESSOR  Take 1 tablet (25 mg total) by mouth 2 (two) times daily.        HOSPITAL MEDICATIONS: Prior to Admission:  (Not in a hospital admission)  VITALS: Blood pressure 142/79, pulse 91, temperature 98.4 F (36.9 C), resp. rate 17, SpO2 90 %.  PHYSICAL EXAM: General appearance: appears older than stated age, cachectic, no distress and somewhat  sleepy Neck: JVD - 2 cm above sternal notch and no carotid bruit Lungs: clear to auscultation bilaterally Heart: regular rate and rhythm and diastolic murmur: mid diastolic 2/6, blowing at 2nd left intercostal space Abdomen: soft, mild TTP, no rebound Extremities: extremities normal, atraumatic, no cyanosis or edema and reproducible left lower ribcage pain with palpation Pulses: 2+ and symmetric Skin: Skin color, texture, turgor normal. No rashes or lesions Neurologic: Grossly normal Psych: Pleasant  LABS: Results for orders placed or performed during the hospital encounter of 07/06/16 (from the past 48 hour(s))  Basic metabolic panel     Status: Abnormal   Collection Time: 07/06/16  2:26 PM  Result Value Ref Range   Sodium 140 135 - 145 mmol/L   Potassium 4.8 3.5 - 5.1 mmol/L   Chloride 116 (H) 101 - 111 mmol/L   CO2 19 (L) 22 - 32 mmol/L   Glucose, Bld 111 (H) 65 - 99 mg/dL   BUN 26 (H) 6 - 20 mg/dL   Creatinine, Ser 0.93 0.44 - 1.00 mg/dL   Calcium 10.1 8.9 - 10.3 mg/dL   GFR calc non Af Amer 55 (L) >60 mL/min   GFR calc Af Amer >60 >60 mL/min    Comment: (NOTE) The eGFR has been calculated using the CKD EPI equation. This calculation has not been validated in all clinical situations. eGFR's persistently <60 mL/min signify possible Chronic Kidney Disease.    Anion gap 5 5 - 15  CBC     Status: Abnormal   Collection Time: 07/06/16  2:26 PM  Result Value Ref Range   WBC 9.1 4.0 - 10.5 K/uL   RBC 3.06 (L) 3.87 - 5.11 MIL/uL   Hemoglobin 8.6 (L) 12.0 - 15.0 g/dL   HCT 26.1 (L) 36.0 - 46.0 %   MCV 85.3 78.0 - 100.0 fL   MCH 28.1 26.0 - 34.0 pg   MCHC 33.0 30.0 - 36.0 g/dL   RDW 15.3 11.5 - 15.5 %   Platelets 225 150 - 400 K/uL  I-stat troponin, ED     Status: None   Collection Time: 07/06/16  2:48 PM  Result Value Ref Range   Troponin i, poc 0.03 0.00 - 0.08 ng/mL   Comment 3            Comment: Due to the release kinetics of cTnI, a negative result within the first  hours of the onset of symptoms does not rule out myocardial infarction with certainty. If myocardial infarction is still suspected, repeat the test at appropriate intervals.     IMAGING: Dg Chest 2 View  07/06/2016  CLINICAL DATA:  Chest and back pain.  Fever. EXAM: CHEST  2 VIEW COMPARISON:  PA and lateral chest 06/08/2016 and 07/26/2013. FINDINGS: Postoperative change of left axillary dissection is again seen. Mild atelectasis is seen in the periphery of the right lung base. The lungs are otherwise clear. Heart size is upper normal. No pneumothorax or pleural effusion. No focal bony abnormality. IMPRESSION: No  acute disease. Electronically Signed   By: Inge Rise M.D.   On: 07/06/2016 14:45    HOSPITAL DIAGNOSES: Active Problems:   HYPERTENSION, BENIGN SYSTEMIC   Iron deficiency anemia   Chronic systolic CHF (congestive heart failure) (HCC)   Chest pain   IMPRESSION: 1. Atypical chest pain for ACS, however, possibly concerning for dissection or PE 2. Reproducible left lower rib cage/flank pain 3. Weight loss, early satiety 4. Chronic anemia 5. History of cardiomyopathy - normalized LVEF 07/2014  RECOMMENDATION: 1. Acute onset chest pain, back pain and upper abdominal pain - ?if improved with nitro quickly. Symptoms with radiation to back as well as left lower chest wall could describe PE or dissection. I agree with CT angio ASAP to evaluate for these etiologies. Would trend troponin and consider additional testing in am tomorrow. EKG changes more suggestive of LVH with strain, rather than ischemia.  Thanks for the consult. Cardiology will follow with you.  Time Spent Directly with Patient: 30 minutes  Pixie Casino, MD, [facility] Attending Cardiologist [place] 07/06/2016, 4:32 PM

## 2016-07-07 ENCOUNTER — Ambulatory Visit (HOSPITAL_BASED_OUTPATIENT_CLINIC_OR_DEPARTMENT_OTHER)
Admit: 2016-07-07 | Discharge: 2016-07-07 | Disposition: A | Discharge status: Home / Self Care | Payer: Medicare Other | Attending: Family Medicine | Admitting: Family Medicine

## 2016-07-07 ENCOUNTER — Observation Stay (HOSPITAL_BASED_OUTPATIENT_CLINIC_OR_DEPARTMENT_OTHER): Payer: Medicare Other

## 2016-07-07 ENCOUNTER — Ambulatory Visit (HOSPITAL_COMMUNITY)
Admit: 2016-07-07 | Discharge: 2016-07-07 | Disposition: A | Discharge status: Home / Self Care | Payer: Medicare Other | Attending: Family Medicine | Admitting: Family Medicine

## 2016-07-07 DIAGNOSIS — I5022 Chronic systolic (congestive) heart failure: Secondary | ICD-10-CM | POA: Diagnosis not present

## 2016-07-07 DIAGNOSIS — I110 Hypertensive heart disease with heart failure: Secondary | ICD-10-CM | POA: Diagnosis not present

## 2016-07-07 DIAGNOSIS — R079 Chest pain, unspecified: Secondary | ICD-10-CM

## 2016-07-07 DIAGNOSIS — D509 Iron deficiency anemia, unspecified: Secondary | ICD-10-CM | POA: Diagnosis not present

## 2016-07-07 DIAGNOSIS — M549 Dorsalgia, unspecified: Secondary | ICD-10-CM | POA: Diagnosis not present

## 2016-07-07 DIAGNOSIS — I429 Cardiomyopathy, unspecified: Secondary | ICD-10-CM | POA: Diagnosis not present

## 2016-07-07 DIAGNOSIS — I351 Nonrheumatic aortic (valve) insufficiency: Secondary | ICD-10-CM

## 2016-07-07 DIAGNOSIS — I10 Essential (primary) hypertension: Secondary | ICD-10-CM | POA: Diagnosis not present

## 2016-07-07 DIAGNOSIS — F17290 Nicotine dependence, other tobacco product, uncomplicated: Secondary | ICD-10-CM | POA: Diagnosis not present

## 2016-07-07 DIAGNOSIS — N838 Other noninflammatory disorders of ovary, fallopian tube and broad ligament: Secondary | ICD-10-CM

## 2016-07-07 DIAGNOSIS — R9431 Abnormal electrocardiogram [ECG] [EKG]: Secondary | ICD-10-CM | POA: Diagnosis not present

## 2016-07-07 LAB — CBC
HCT: 27.3 % — ABNORMAL LOW (ref 36.0–46.0)
HEMOGLOBIN: 8.8 g/dL — AB (ref 12.0–15.0)
MCH: 27.7 pg (ref 26.0–34.0)
MCHC: 32.2 g/dL (ref 30.0–36.0)
MCV: 85.8 fL (ref 78.0–100.0)
PLATELETS: 216 10*3/uL (ref 150–400)
RBC: 3.18 MIL/uL — AB (ref 3.87–5.11)
RDW: 15.3 % (ref 11.5–15.5)
WBC: 9.8 10*3/uL (ref 4.0–10.5)

## 2016-07-07 LAB — ECHOCARDIOGRAM COMPLETE
HEIGHTINCHES: 64 in
Weight: 2010.6 oz

## 2016-07-07 LAB — NM MYOCAR MULTI W/SPECT W/WALL MOTION / EF
CHL CUP MPHR: 136 {beats}/min
CHL CUP RESTING HR STRESS: 88 {beats}/min
CSEPEDS: 0 s
CSEPEW: 1 METS
CSEPPHR: 107 {beats}/min
Exercise duration (min): 5 min
Percent HR: 78 %

## 2016-07-07 LAB — COMPREHENSIVE METABOLIC PANEL
ALBUMIN: 3.6 g/dL (ref 3.5–5.0)
ALK PHOS: 87 U/L (ref 38–126)
ALT: 10 U/L — AB (ref 14–54)
AST: 15 U/L (ref 15–41)
Anion gap: 5 (ref 5–15)
BUN: 26 mg/dL — AB (ref 6–20)
CALCIUM: 10.2 mg/dL (ref 8.9–10.3)
CHLORIDE: 110 mmol/L (ref 101–111)
CO2: 22 mmol/L (ref 22–32)
CREATININE: 1.05 mg/dL — AB (ref 0.44–1.00)
GFR calc non Af Amer: 47 mL/min — ABNORMAL LOW (ref >60)
GFR, EST AFRICAN AMERICAN: 55 mL/min — AB (ref >60)
GLUCOSE: 86 mg/dL (ref 65–99)
Potassium: 4.2 mmol/L (ref 3.5–5.1)
SODIUM: 137 mmol/L (ref 135–145)
Total Bilirubin: 0.6 mg/dL (ref 0.3–1.2)
Total Protein: 7.5 g/dL (ref 6.5–8.1)

## 2016-07-07 LAB — TROPONIN I: Troponin I: 0.04 ng/mL (ref <0.03)

## 2016-07-07 MED ORDER — TECHNETIUM TC 99M TETROFOSMIN IV KIT
10.0000 | PACK | Freq: Once | INTRAVENOUS | Status: AC | PRN
  Administered 2016-07-07: 10 via INTRAVENOUS

## 2016-07-07 MED ORDER — TECHNETIUM TC 99M TETROFOSMIN IV KIT
30.0000 | PACK | Freq: Once | INTRAVENOUS | Status: AC | PRN
  Administered 2016-07-07: 30 via INTRAVENOUS

## 2016-07-07 MED ORDER — REGADENOSON 0.4 MG/5ML IV SOLN
INTRAVENOUS | Status: AC
  Filled 2016-07-07: qty 5

## 2016-07-07 MED ORDER — REGADENOSON 0.4 MG/5ML IV SOLN
0.4000 mg | Freq: Once | INTRAVENOUS | Status: AC
  Administered 2016-07-07: 0.4 mg via INTRAVENOUS

## 2016-07-07 NOTE — Progress Notes (Signed)
Patient Profile: 82 y.o. female with a past medical history significant for breast cancer, systolic LV dysfunction due to presumed non-ischemic cardiomyopathy with recovery of her EF. She was admitted 07/06/16 for acute onset of chest/ back pain.   Subjective: Resting in bed. Denies any symptoms currently.   Objective: Vital signs in last 24 hours: Temp:  [98 F (36.7 C)-99.3 F (37.4 C)] 98.9 F (37.2 C) (07/12 0559) Pulse Rate:  [77-95] 92 (07/12 0559) Resp:  [12-25] 14 (07/12 0559) BP: (115-155)/(43-94) 155/43 mmHg (07/12 0559) SpO2:  [90 %-100 %] 96 % (07/12 0559) Weight:  [125 lb 10.6 oz (57 kg)] 125 lb 10.6 oz (57 kg) (07/11 1837) Last BM Date: 07/06/16  Intake/Output from previous day: 07/11 0701 - 07/12 0700 In: 100 [P.O.:100] Out: 1250 [Urine:1250] Intake/Output this shift: Total I/O In: 0  Out: 200 [Urine:200]  Medications Current Facility-Administered Medications  Medication Dose Route Frequency Provider Last Rate Last Dose  . acetaminophen (TYLENOL) tablet 650 mg  650 mg Oral Q4H PRN Clanford Marisa Hua, MD      . aspirin EC tablet 81 mg  81 mg Oral Daily Clanford Marisa Hua, MD   81 mg at 07/06/16 2003  . enoxaparin (LOVENOX) injection 40 mg  40 mg Subcutaneous Q24H Clanford Marisa Hua, MD   40 mg at 07/06/16 2003  . ferrous sulfate tablet 325 mg  325 mg Oral QHS Clanford Marisa Hua, MD   325 mg at 07/06/16 2324  . gi cocktail (Maalox,Lidocaine,Donnatal)  30 mL Oral QID PRN Clanford Marisa Hua, MD      . metoprolol tartrate (LOPRESSOR) tablet 25 mg  25 mg Oral BID Clanford Marisa Hua, MD   25 mg at 07/06/16 2324  . morphine 2 MG/ML injection 1 mg  1 mg Intravenous Q4H PRN Clanford Marisa Hua, MD      . multivitamin with minerals tablet 1 tablet  1 tablet Oral Daily Clanford Marisa Hua, MD      . nitroGLYCERIN (NITROSTAT) SL tablet 0.4 mg  0.4 mg Sublingual Q5 min PRN Clanford Marisa Hua, MD      . ondansetron (ZOFRAN) injection 4 mg  4 mg Intravenous Q6H PRN Clanford  Marisa Hua, MD      . pantoprazole (PROTONIX) EC tablet 40 mg  40 mg Oral Q0600 Clanford Marisa Hua, MD   40 mg at 07/07/16 0501    PE: General appearance: alert, cooperative, no distress and elderly and frail Neck: no carotid bruit and no JVD Lungs: clear to auscultation bilaterally Heart: regular rate and rhythm, 2/6 murmur at LUSB Extremities: no LEE Pulses: 2+ and symmetric Skin: warm and dry Neurologic: Grossly normal  Lab Results:   Recent Labs  07/06/16 1426 07/07/16 0152  WBC 9.1 9.8  HGB 8.6* 8.8*  HCT 26.1* 27.3*  PLT 225 216   BMET  Recent Labs  07/06/16 1426 07/07/16 0152  NA 140 137  K 4.8 4.2  CL 116* 110  CO2 19* 22  GLUCOSE 111* 86  BUN 26* 26*  CREATININE 0.93 1.05*  CALCIUM 10.1 10.2   PT/INR No results for input(s): LABPROT, INR in the last 72 hours. Cholesterol  Recent Labs  07/06/16 1954  CHOL 101   Cardiac Panel (last 3 results)  Recent Labs  07/06/16 1954 07/07/16 0152  TROPONINI 0.05* 0.04*    Studies/Results: CT of chest 07/06/16 IMPRESSION: No evidence of aortic dissection or pulmonary embolism.  Complex appearing lesion in the expected region of the left  ovary. Given the patient's age this is suspicious for underlying neoplasm and further workup is recommended.  Rounded area of soft tissue within the right lower lobe posterior to the carina as described. Followup imaging is recommended. These changes may also be related to the left ovary.  Cystic area adjacent to the pancreas which appears separate from the pancreas but may be related to the changes in the left ovary.  Assessment/Plan  Active Problems:   HYPERTENSION, BENIGN SYSTEMIC   Iron deficiency anemia   Chronic systolic CHF (congestive heart failure) (HCC)   Chest pain   Abnormal EKG   1. Chest Pain: Chest CT negative for dissection and PE. Troponins cycled x 3 with flat low level trend, 0.05>>0.04, not consistent with ACS. 2D echo pending. NST ordered  for today.   2. Abnormal CT Findings: CT ruled out acute cardiac/pulmonary pathology, however there were incidental findings of complex appearing lesion in the expected region of the left ovary, concerning for underlying neoplasm. Further w/u per primary team.   3. Iron Deficiency Anemia: Hgb 8.8. Management per IM.   4. H/o NICM: she has a h/o systolic LV dysfunction due to presumed non-ischemic cardiomyopathy with recovery of her EF in the past. F/u 2D echo pending.    Brittainy M. Rosita Fire, PA-C 07/07/2016 9:07 AM

## 2016-07-07 NOTE — Progress Notes (Signed)
Patient refused morning lab draw.

## 2016-07-07 NOTE — Progress Notes (Signed)
  Echocardiogram 2D Echocardiogram has been performed.  Darlina Sicilian M 07/07/2016, 9:01 AM

## 2016-07-07 NOTE — Progress Notes (Addendum)
PROGRESS NOTE    Valerie Downs  [id number] DOB: [birth year] DOA: 07/06/2016  PCP: Harvie Junior, MD   Brief Narrative:  82 y/o with HTN and nicotine abuse presents for chest pain described as heaviness in her left chest.   Subjective: No pain during stress test today. Had some pain under left axilla last night but this resolved on its own. No other complaints.   Assessment & Plan:   Principal Problem:   Chest pain - myoview shows a scar and EF of 40 %- cardiology plans on cath if patient is agreeable - CT negative for PE  Active Problems:   Ovarian mass, left - noted incidentally on CTA - has possible lesions in RLL and adjacent to pancrease as well - have discussed with patient and daughters- she will take CD of CT scan with her to Gyn for further work up  Mild renal insufficiency - recheck Bmet tomorrow AM - Losartan on hold    HYPERTENSION, BENIGN SYSTEMIC - Metoprolol - Losartan on hold    Iron deficiency anemia - ferrous sulfate    Chronic systolic CHF (congestive heart failure)- NICM - compensated- see ECHO below - on Losartan and Metoprolol     DVT prophylaxis: Lovenox Code Status: full code Family Communication:  daughters Disposition Plan: home when work up complete Consultants:   cardiology Procedures:   Myoview IMPRESSION: 1. No reversible ischemia. Inferior wall attenuation or scar.  2. Global hypokinesis most marked in the septum.  3. Left ventricular ejection fraction 44%  4. Intermediate-risk stress test findings*.   ECHO Study Conclusions  - Left ventricle: The cavity size was normal. There was severe  focal basal and moderate concentric hypertrophy of the left  ventricle . Systolic function was normal. The estimated ejection  fraction was in the range of 60% to 65%. Wall motion was normal;  there were no regional wall motion abnormalities. Doppler  parameters are consistent with abnormal left ventricular   relaxation (grade 1 diastolic dysfunction). Doppler parameters  are consistent with elevated ventricular end-diastolic filling  pressure. - Aortic valve: There was moderate regurgitation. - Aortic root: The aortic root was mildly dilated measuring 40 mm. - Mitral valve: Calcified annulus. Mildly thickened leaflets .  There was moderate regurgitation. - Left atrium: The atrium was severely dilated. - Tricuspid valve: There was mild regurgitation. - Pulmonic valve: There was mild regurgitation. - Pulmonary arteries: Systolic pressure was within the normal  range.  Antimicrobials:  Anti-infectives    None       Objective: Filed Vitals:   07/06/16 2329 07/07/16 0148 07/07/16 0559 07/07/16 1417  BP: 152/70 142/94 155/43 158/89  Pulse:  77 92 92  Temp: 98 F (36.7 C) 98.4 F (36.9 C) 98.9 F (37.2 C) 98.4 F (36.9 C)  TempSrc: Oral Oral Oral Oral  Resp:  12 14 20   Height:      Weight:      SpO2:  98% 96% 100%    Intake/Output Summary (Last 24 hours) at 07/07/16 1802 Last data filed at 07/07/16 1418  Gross per 24 hour  Intake    100 ml  Output   1450 ml  Net  -1350 ml   Filed Weights   07/06/16 1837  Weight: 57 kg (125 lb 10.6 oz)    Examination: General exam: Appears comfortable  HEENT: PERRLA, oral mucosa moist, no sclera icterus or thrush Respiratory system: Clear to auscultation. Respiratory effort normal. Cardiovascular system: S1 & S2 heard, RRR.  No  murmurs  Gastrointestinal system: Abdomen soft, non-tender, nondistended. Normal bowel sound. No organomegaly Central nervous system: Alert and oriented. No focal neurological deficits. Extremities: No cyanosis, clubbing or edema Skin: No rashes or ulcers Psychiatry:  Mood & affect appropriate.     Data Reviewed: I have personally reviewed following labs and imaging studies  CBC:  Recent Labs Lab 07/06/16 1426 07/07/16 0152  WBC 9.1 9.8  HGB 8.6* 8.8*  HCT 26.1* 27.3*  MCV 85.3 85.8  PLT 225  [id number]   Basic Metabolic Panel:  Recent Labs Lab 07/06/16 1426 07/07/16 0152  NA 140 137  K 4.8 4.2  CL 116* 110  CO2 19* 22  GLUCOSE 111* 86  BUN 26* 26*  CREATININE 0.93 1.05*  CALCIUM 10.1 10.2   GFR: Estimated Creatinine Clearance: 34.4 mL/min (by C-G formula based on Cr of 1.05). Liver Function Tests:  Recent Labs Lab 07/07/16 0152  AST 15  ALT 10*  ALKPHOS 87  BILITOT 0.6  PROT 7.5  ALBUMIN 3.6   No results for input(s): LIPASE, AMYLASE in the last 168 hours. No results for input(s): AMMONIA in the last 168 hours. Coagulation Profile: No results for input(s): INR, PROTIME in the last 168 hours. Cardiac Enzymes:  Recent Labs Lab 07/06/16 1954 07/07/16 0152  TROPONINI 0.05* 0.04*   BNP (last 3 results) No results for input(s): PROBNP in the last 8760 hours. HbA1C: No results for input(s): HGBA1C in the last 72 hours. CBG: No results for input(s): GLUCAP in the last 168 hours. Lipid Profile:  Recent Labs  07/06/16 1954  CHOL 101  HDL 39*  LDLCALC 49  TRIG 66  CHOLHDL 2.6   Thyroid Function Tests:  Recent Labs  07/06/16 1954  TSH 0.038*   Anemia Panel:  Recent Labs  07/06/16 1954 07/06/16 1956  VITAMINB12  --  238  FOLATE  --  25.6  FERRITIN  --  41  TIBC  --  258  IRON  --  21*  RETICCTPCT 0.5  --    Urine analysis:    Component Value Date/Time   COLORURINE YELLOW 08/16/2014 0535   APPEARANCEUR CLOUDY* 08/16/2014 0535   LABSPEC 1.012 08/16/2014 0535   PHURINE 5.5 08/16/2014 [place] 08/16/2014 0535   HGBUR NEGATIVE 08/16/2014 0535   BILIRUBINUR NEGATIVE 08/16/2014 0535   KETONESUR NEGATIVE 08/16/2014 0535   PROTEINUR NEGATIVE 08/16/2014 0535   UROBILINOGEN 0.2 08/16/2014 0535   NITRITE NEGATIVE 08/16/2014 0535   LEUKOCYTESUR SMALL* 08/16/2014 0535   Sepsis Labs: @LABRCNTIP (procalcitonin:4,lacticidven:4) )No results found for this or any previous visit (from the past 240 hour(s)).       Radiology  Studies: Dg Chest 2 View  07/06/2016  CLINICAL DATA:  Chest and back pain.  Fever. EXAM: CHEST  2 VIEW COMPARISON:  PA and lateral chest 06/08/2016 and 07/26/2013. FINDINGS: Postoperative change of left axillary dissection is again seen. Mild atelectasis is seen in the periphery of the right lung base. The lungs are otherwise clear. Heart size is upper normal. No pneumothorax or pleural effusion. No focal bony abnormality. IMPRESSION: No acute disease. Electronically Signed   By: Inge Rise M.D.   On: 07/06/2016 14:45   Nm Myocar Multi W/spect W/wall Motion / Ef  07/07/2016  CLINICAL DATA:  Cardiomyopathy.  Hypertension, CHF, chest pain. EXAM: MYOCARDIAL IMAGING WITH SPECT (REST AND PHARMACOLOGIC-STRESS) GATED LEFT VENTRICULAR WALL MOTION STUDY LEFT VENTRICULAR EJECTION FRACTION TECHNIQUE: Standard myocardial SPECT imaging was performed after resting intravenous injection of 10 mCi  Tc-33m sestamibi. Subsequently, intravenous infusion of Lexiscan was performed under the supervision of the Cardiology staff. At peak effect of the drug, 30 mCi Tc-53m sestamibi was injected intravenously and standard myocardial SPECT imaging was performed. Quantitative gated imaging was also performed to evaluate left ventricular wall motion, and estimate left ventricular ejection fraction. COMPARISON:  None. FINDINGS: Perfusion: There is a moderate area of mildly decreased activity involving the inferior wall of the left ventricular myocardium on both the rest and stress deep studies. No suggestion of reversible ischemia. Wall Motion: Global hypokinesis particularly in the set him. No left ventricular dilation. Left Ventricular Ejection Fraction: 44 % End diastolic volume 123456 ml End systolic volume 67 ml IMPRESSION: 1. No reversible ischemia.  Inferior wall attenuation or scar. 2. Global hypokinesis most marked in the septum. 3. Left ventricular ejection fraction 44% 4. Intermediate-risk stress test findings*. *2012  Appropriate Use Criteria for Coronary Revascularization Focused Update: J Am Coll Cardiol. [id number]. [email or web address] Electronically Signed   By: Lucrezia Europe M.D.   On: 07/07/2016 15:10   Ct Angio Chest/abd/pel For Dissection W And/or W/wo  07/06/2016  CLINICAL DATA:  Chest pain and back pain for 1 day, initial encounter EXAM: CT ANGIOGRAPHY CHEST, ABDOMEN AND PELVIS TECHNIQUE: Multidetector CT imaging through the chest, abdomen and pelvis was performed using the standard protocol during bolus administration of intravenous contrast. Multiplanar reconstructed images and MIPs were obtained and reviewed to evaluate the vascular anatomy. CONTRAST:  80 mL Isovue 370 COMPARISON:  05/08/2011. FINDINGS: CTA CHEST FINDINGS Nonvascular: The lungs are well aerated bilaterally. There is a rounded area soft tissue density measuring approximately 2 cm just posterior to the carina within the right lower lobe. This likely represents an area of round pneumonia although follow-up examination is recommended to assess for resolution. No other nodules are seen. No focal infiltrate or sizable effusion is seen. The thoracic inlet is within normal limits. No significant hilar or mediastinal adenopathy is noted. The osseous structures show degenerative change of the thoracic spine. No acute bony abnormality is seen. Vascular: The thoracic aorta and its branches are well visualized. Dilatation of the ascending aorta to 5 cm is noted. Mild coronary calcifications are seen. No findings of dissection are noted. The pulmonary artery demonstrates a normal branching pattern. No findings to suggest pulmonary embolus are noted. The cardiac structures are otherwise within normal limits. Review of the MIP images confirms the above findings. CTA ABDOMEN AND PELVIS FINDINGS Vascular: The aorta shows atherosclerotic calcifications although no aneurysmal dilatation is seen. No findings of dissection  are noted. No focal areas of narrowing are seen. The celiac axis, superior mesenteric artery and inferior mesenteric artery are all widely patent. Single renal artery is identified on the right with dual renal arteries on the left. Nonvascular: The liver, spleen, adrenal glands and pancreas are within normal limits. Kidneys demonstrates some cystic change. No obstructive changes are noted. No calculi are identified. Just beneath the pancreas there is a small cystic appearing area which appears separate from the pancreas. May be related to prior inflammatory change. It measures 2.3 cm and is best seen on image number 96 of series 5. The appendix is within normal limits. The bladder is well distended. Multiple calcified uterine fibroids are seen. There is a predominately cystic appearing mass lesion arising in the expected area of the left ovary. It demonstrates some adjacent soft tissue component and measures approximately 7.8 by 3.7 cm in greatest dimension. Given the patient's age these changes are suspicious for  underlying neoplasm. Further workup is recommended. No significant lymphadenopathy is identified. The changes previously described adjacent to the pancreas may be related to this lesion. The bony structures show degenerative changes of the lumbar spine. Review of the MIP images confirms the above findings. IMPRESSION: No evidence of aortic dissection or pulmonary embolism. Complex appearing lesion in the expected region of the left ovary. Given the patient's age this is suspicious for underlying neoplasm and further workup is recommended. Rounded area of soft tissue within the right lower lobe posterior to the carina as described. Followup imaging is recommended. These changes may also be related to the left ovary. Cystic area adjacent to the pancreas which appears separate from the pancreas but may be related to the changes in the left ovary. Electronically Signed   By: Inez Catalina M.D.   On: 07/06/2016  18:01      Scheduled Meds: . aspirin EC  81 mg Oral Daily  . enoxaparin (LOVENOX) injection  40 mg Subcutaneous Q24H  . ferrous sulfate  325 mg Oral QHS  . metoprolol tartrate  25 mg Oral BID  . multivitamin with minerals  1 tablet Oral Daily  . pantoprazole  40 mg Oral Q0600   Continuous Infusions:       Time spent in minutes: [place], MD [facility] Pager: [email or web address] Password [facility] 07/07/2016, 6:02 PM

## 2016-07-07 NOTE — Discharge Summary (Signed)
Physician Discharge Summary  Valerie Downs [id number] DOB: 1930/04/30 DOA: 07/06/2016  PCP: Harvie Junior, MD  Admit date: 07/06/2016 Discharge date: 07/08/2016  Admitted From: home  Disposition:  home   Recommendations for Outpatient Follow-up:  1. F/u on ovarian mass 2. bmet in 1 wk- resume Losartan if Cr back to baseline  Home Health:  no  Equipment/Devices:  none  Discharge Condition:  stable   CODE STATUS:  Full code   Diet recommendation:   Heart healthy Consultations:  cardiology    Discharge Diagnoses:  Principal Problem:   Chest pain Active Problems:   HYPERTENSION, BENIGN SYSTEMIC   Iron deficiency anemia   Chronic systolic CHF (congestive heart failure) (HCC)   Ovarian mass, left   Brief Summary: 82 y/o with HTN and nicotine abuse presents for chest pain described as heaviness in her left chest.   Hospital Course:  Principal Problem:  Chest pain - myoview shows a scar and EF of 40 %- cardiology recommended a cath- patient has declined this - will discharge on a baby aspirin daily- already on Metoprolol - ECHO does not show a scar and shows a normal EF- see report below - CT negative for PE  Active Problems:  Ovarian mass, left - noted incidentally on CTA - has possible lesions in RLL and adjacent to pancrease as well - have discussed with patient and daughters- she will take CD of CT scan with her to Gyn for further work up  Mild renal insufficiency - see labs below - Losartan on hold during hospital stay- she refused a blood draw today   HYPERTENSION, BENIGN SYSTEMIC - Metoprolol - Losartan on hold   Iron deficiency anemia - ferrous sulfate   Chronic systolic CHF (congestive heart failure)- NICM - compensated- see ECHO below - on Losartan (held) and Metoprolol   Discharge Instructions      Discharge Instructions    Diet - low sodium heart healthy    Complete by:  As directed      Discharge instructions    Complete by:  As  directed   Discuss ovarian mass with your PCP and Gyn- you have the CD with you.     Increase activity slowly    Complete by:  As directed             Medication List    TAKE these medications        aspirin 81 MG EC tablet  Take 1 tablet (81 mg total) by mouth daily.     CENTRUM SILVER 50+WOMEN Tabs  Take 1 tablet by mouth daily.     ferrous sulfate 325 (65 FE) MG tablet  Take 325 mg by mouth at bedtime.     metoprolol tartrate 25 MG tablet  Commonly known as:  LOPRESSOR  Take 1 tablet (25 mg total) by mouth 2 (two) times daily.        Allergies  Allergen Reactions  . Penicillins Anaphylaxis and Swelling    Has patient had a PCN reaction causing immediate rash, facial/tongue/throat swelling, SOB or lightheadedness with hypotension: Yes Has patient had a PCN reaction causing severe rash involving mucus membranes or skin necrosis: Yes Has patient had a PCN reaction that required hospitalization Yes Has patient had a PCN reaction occurring within the last 10 years: No If all of the above answers are "NO", then may proceed with Cephalosporin use.      Procedures/Studies: 2D ECHO Left ventricle: The cavity size was normal. There was  severe  focal basal and moderate concentric hypertrophy of the left  ventricle . Systolic function was normal. The estimated ejection  fraction was in the range of 60% to 65%. Wall motion was normal;  there were no regional wall motion abnormalities. Doppler  parameters are consistent with abnormal left ventricular  relaxation (grade 1 diastolic dysfunction). Doppler parameters  are consistent with elevated ventricular end-diastolic filling  pressure. - Aortic valve: There was moderate regurgitation. - Aortic root: The aortic root was mildly dilated measuring 40 mm. - Mitral valve: Calcified annulus. Mildly thickened leaflets .  There was moderate regurgitation. - Left atrium: The atrium was severely dilated. - Tricuspid  valve: There was mild regurgitation. - Pulmonic valve: There was mild regurgitation. - Pulmonary arteries: Systolic pressure was within the normal  range.   Dg Chest 2 View  07/06/2016  CLINICAL DATA:  Chest and back pain.  Fever. EXAM: CHEST  2 VIEW COMPARISON:  PA and lateral chest 06/08/2016 and 07/26/2013. FINDINGS: Postoperative change of left axillary dissection is again seen. Mild atelectasis is seen in the periphery of the right lung base. The lungs are otherwise clear. Heart size is upper normal. No pneumothorax or pleural effusion. No focal bony abnormality. IMPRESSION: No acute disease. Electronically Signed   By: Inge Rise M.D.   On: 07/06/2016 14:45   Nm Myocar Multi W/spect W/wall Motion / Ef  07/07/2016  CLINICAL DATA:  Cardiomyopathy.  Hypertension, CHF, chest pain. EXAM: MYOCARDIAL IMAGING WITH SPECT (REST AND PHARMACOLOGIC-STRESS) GATED LEFT VENTRICULAR WALL MOTION STUDY LEFT VENTRICULAR EJECTION FRACTION TECHNIQUE: Standard myocardial SPECT imaging was performed after resting intravenous injection of 10 mCi Tc-73m sestamibi. Subsequently, intravenous infusion of Lexiscan was performed under the supervision of the Cardiology staff. At peak effect of the drug, 30 mCi Tc-72m sestamibi was injected intravenously and standard myocardial SPECT imaging was performed. Quantitative gated imaging was also performed to evaluate left ventricular wall motion, and estimate left ventricular ejection fraction. COMPARISON:  None. FINDINGS: Perfusion: There is a moderate area of mildly decreased activity involving the inferior wall of the left ventricular myocardium on both the rest and stress deep studies. No suggestion of reversible ischemia. Wall Motion: Global hypokinesis particularly in the set him. No left ventricular dilation. Left Ventricular Ejection Fraction: 44 % End diastolic volume 123456 ml End systolic volume 67 ml IMPRESSION: 1. No reversible ischemia.  Inferior wall attenuation or  scar. 2. Global hypokinesis most marked in the septum. 3. Left ventricular ejection fraction 44% 4. Intermediate-risk stress test findings*. *2012 Appropriate Use Criteria for Coronary Revascularization Focused Update: J Am Coll Cardiol. [id number]. http://content.airportbarriers.com.aspx?[email or web address] Electronically Signed   By: Lucrezia Europe M.D.   On: 07/07/2016 15:10   Ct Angio Chest/abd/pel For Dissection W And/or W/wo  07/06/2016  CLINICAL DATA:  Chest pain and back pain for 1 day, initial encounter EXAM: CT ANGIOGRAPHY CHEST, ABDOMEN AND PELVIS TECHNIQUE: Multidetector CT imaging through the chest, abdomen and pelvis was performed using the standard protocol during bolus administration of intravenous contrast. Multiplanar reconstructed images and MIPs were obtained and reviewed to evaluate the vascular anatomy. CONTRAST:  80 mL Isovue 370 COMPARISON:  05/08/2011. FINDINGS: CTA CHEST FINDINGS Nonvascular: The lungs are well aerated bilaterally. There is a rounded area soft tissue density measuring approximately 2 cm just posterior to the carina within the right lower lobe. This likely represents an area of round pneumonia although follow-up examination is recommended to assess for resolution. No other nodules are seen. No focal infiltrate or sizable effusion  is seen. The thoracic inlet is within normal limits. No significant hilar or mediastinal adenopathy is noted. The osseous structures show degenerative change of the thoracic spine. No acute bony abnormality is seen. Vascular: The thoracic aorta and its branches are well visualized. Dilatation of the ascending aorta to 5 cm is noted. Mild coronary calcifications are seen. No findings of dissection are noted. The pulmonary artery demonstrates a normal branching pattern. No findings to suggest pulmonary embolus are noted. The cardiac structures are otherwise within normal limits. Review of the MIP images confirms the above findings. CTA ABDOMEN  AND PELVIS FINDINGS Vascular: The aorta shows atherosclerotic calcifications although no aneurysmal dilatation is seen. No findings of dissection are noted. No focal areas of narrowing are seen. The celiac axis, superior mesenteric artery and inferior mesenteric artery are all widely patent. Single renal artery is identified on the right with dual renal arteries on the left. Nonvascular: The liver, spleen, adrenal glands and pancreas are within normal limits. Kidneys demonstrates some cystic change. No obstructive changes are noted. No calculi are identified. Just beneath the pancreas there is a small cystic appearing area which appears separate from the pancreas. May be related to prior inflammatory change. It measures 2.3 cm and is best seen on image number 96 of series 5. The appendix is within normal limits. The bladder is well distended. Multiple calcified uterine fibroids are seen. There is a predominately cystic appearing mass lesion arising in the expected area of the left ovary. It demonstrates some adjacent soft tissue component and measures approximately 7.8 by 3.7 cm in greatest dimension. Given the patient's age these changes are suspicious for underlying neoplasm. Further workup is recommended. No significant lymphadenopathy is identified. The changes previously described adjacent to the pancreas may be related to this lesion. The bony structures show degenerative changes of the lumbar spine. Review of the MIP images confirms the above findings. IMPRESSION: No evidence of aortic dissection or pulmonary embolism. Complex appearing lesion in the expected region of the left ovary. Given the patient's age this is suspicious for underlying neoplasm and further workup is recommended. Rounded area of soft tissue within the right lower lobe posterior to the carina as described. Followup imaging is recommended. These changes may also be related to the left ovary. Cystic area adjacent to the pancreas which  appears separate from the pancreas but may be related to the changes in the left ovary. Electronically Signed   By: Inez Catalina M.D.   On: 07/06/2016 18:01       Discharge Exam: Filed Vitals:   07/08/16 1058 07/08/16 1615  BP: 109/79 131/52  Pulse: 86 86  Temp:  97.9 F (36.6 C)  Resp:  20   Filed Vitals:   07/08/16 0500 07/08/16 0542 07/08/16 1058 07/08/16 1615  BP:  149/62 109/79 131/52  Pulse:  87 86 86  Temp:  98.6 F (37 C)  97.9 F (36.6 C)  TempSrc:  Oral  Oral  Resp:  20  20  Height:      Weight: 48.6 kg (107 lb 2.3 oz)     SpO2:  98%  100%    General: Pt is alert, awake, not in acute distress Cardiovascular: RRR, S1/S2 +, no rubs, no gallops Respiratory: CTA bilaterally, no wheezing, no rhonchi Abdominal: Soft, NT, ND, bowel sounds + Extremities: no edema, no cyanosis    The results of significant diagnostics from this hospitalization (including imaging, microbiology, ancillary and laboratory) are listed below for reference.  Microbiology: No results found for this or any previous visit (from the past 240 hour(s)).   Labs: BNP (last 3 results)  Recent Labs  06/08/16 1547 07/06/16 1954  BNP 1011.3* 123456*   Basic Metabolic Panel:  Recent Labs Lab 07/06/16 1426 07/07/16 0152  NA 140 137  K 4.8 4.2  CL 116* 110  CO2 19* 22  GLUCOSE 111* 86  BUN 26* 26*  CREATININE 0.93 1.05*  CALCIUM 10.1 10.2   Liver Function Tests:  Recent Labs Lab 07/07/16 0152  AST 15  ALT 10*  ALKPHOS 87  BILITOT 0.6  PROT 7.5  ALBUMIN 3.6   No results for input(s): LIPASE, AMYLASE in the last 168 hours. No results for input(s): AMMONIA in the last 168 hours. CBC:  Recent Labs Lab 07/06/16 1426 07/07/16 0152  WBC 9.1 9.8  HGB 8.6* 8.8*  HCT 26.1* 27.3*  MCV 85.3 85.8  PLT 225 216   Cardiac Enzymes:  Recent Labs Lab 07/06/16 1954 07/07/16 0152  TROPONINI 0.05* 0.04*   BNP: Invalid input(s): POCBNP CBG: No results for input(s): GLUCAP  in the last 168 hours. D-Dimer No results for input(s): DDIMER in the last 72 hours. Hgb A1c  Recent Labs  07/06/16 1956  HGBA1C 5.4   Lipid Profile  Recent Labs  07/06/16 1954  CHOL 101  HDL 39*  LDLCALC 49  TRIG 66  CHOLHDL 2.6   Thyroid function studies  Recent Labs  07/06/16 1954  TSH 0.038*   Anemia work up  Recent Labs  07/06/16 1954 07/06/16 1956  VITAMINB12  --  238  FOLATE  --  25.6  FERRITIN  --  41  TIBC  --  258  IRON  --  21*  RETICCTPCT 0.5  --    Urinalysis    Component Value Date/Time   COLORURINE YELLOW 08/16/2014 0535   APPEARANCEUR CLOUDY* 08/16/2014 0535   LABSPEC 1.012 08/16/2014 0535   PHURINE 5.5 08/16/2014 0535   GLUCOSEU NEGATIVE 08/16/2014 0535   HGBUR NEGATIVE 08/16/2014 0535   BILIRUBINUR NEGATIVE 08/16/2014 0535   KETONESUR NEGATIVE 08/16/2014 0535   PROTEINUR NEGATIVE 08/16/2014 0535   UROBILINOGEN 0.2 08/16/2014 0535   NITRITE NEGATIVE 08/16/2014 0535   LEUKOCYTESUR SMALL* 08/16/2014 0535   Sepsis Labs Invalid input(s): PROCALCITONIN,  WBC,  LACTICIDVEN Microbiology No results found for this or any previous visit (from the past 240 hour(s)).   Time coordinating discharge: Over 30 minutes  SIGNED:   Debbe Odea, MD  [facility] 07/08/2016, 5:29 PM Pager   If 7PM-7AM, please contact night-coverage [email or web address] Password [email or web address]

## 2016-07-08 ENCOUNTER — Encounter (HOSPITAL_COMMUNITY)
Admission: EM | Disposition: A | Discharge status: Home / Self Care | Payer: Self-pay | Source: Home / Self Care | Attending: Emergency Medicine

## 2016-07-08 DIAGNOSIS — I10 Essential (primary) hypertension: Secondary | ICD-10-CM | POA: Diagnosis not present

## 2016-07-08 DIAGNOSIS — N839 Noninflammatory disorder of ovary, fallopian tube and broad ligament, unspecified: Secondary | ICD-10-CM

## 2016-07-08 DIAGNOSIS — I5022 Chronic systolic (congestive) heart failure: Secondary | ICD-10-CM | POA: Diagnosis not present

## 2016-07-08 DIAGNOSIS — R9431 Abnormal electrocardiogram [ECG] [EKG]: Secondary | ICD-10-CM | POA: Diagnosis not present

## 2016-07-08 DIAGNOSIS — D509 Iron deficiency anemia, unspecified: Secondary | ICD-10-CM | POA: Diagnosis not present

## 2016-07-08 DIAGNOSIS — R079 Chest pain, unspecified: Secondary | ICD-10-CM | POA: Diagnosis not present

## 2016-07-08 LAB — HEMOGLOBIN A1C
HEMOGLOBIN A1C: 5.4 % (ref 4.8–5.6)
Mean Plasma Glucose: 108 mg/dL

## 2016-07-08 SURGERY — LEFT HEART CATH AND CORONARY ANGIOGRAPHY

## 2016-07-08 MED ORDER — ENSURE ENLIVE PO LIQD: 237.0000 mL | Freq: Two times a day (BID) | ORAL | Status: DC

## 2016-07-08 MED ORDER — ASPIRIN 81 MG PO TBEC: 81.0000 mg | DELAYED_RELEASE_TABLET | Freq: Every day | ORAL | Status: DC

## 2016-07-08 NOTE — Progress Notes (Signed)
Initial Nutrition Assessment  DOCUMENTATION CODES:   Severe malnutrition in context of chronic illness  INTERVENTION:   Provide Ensure Enlive po TID, each supplement provides 350 kcal and 20 grams of protein Encourage PO intake RD to continue to monitor  NUTRITION DIAGNOSIS:   Malnutrition related to chronic illness as evidenced by percent weight loss, energy intake < or equal to 75% for > or equal to 1 month, severe depletion of body fat, severe depletion of muscle mass.  GOAL:   Patient will meet greater than or equal to 90% of their needs  MONITOR:   PO intake, Supplement acceptance, Labs, Weight trends, I & O's  REASON FOR ASSESSMENT:   Malnutrition Screening Tool    ASSESSMENT:   82 y.o. female with a past medical history significant for breast cancer, systolic LV dysfunction due to presumed non-ischemic cardiomyopathy with recovery of her EF. She was admitted 07/06/16 for acute onset of chest/ back pain. Minimally elevated troponins. Incidental findings of a left ovarian mass on CT scan. Intermediate risk NST.   Patient reports poor appetite and 26 lb weight loss since November 2016. Pt states that prior to this date she "ate like a hog". Pt states she has had no taste for food, certain foods have no taste. She craves salty foods the most. Pt has not eaten. She states she does plan to order lunch. Discussed eating small frequent meals and provided examples of high protein snacks. Pt states she likes Ensure supplements, RD will order. Per weight history, pt has lost 9 lb since 6/14 (8% wt loss x 1 month, significant for time frame). Pt with severe fat and severe muscle depletion. No edema.  Labs reviewed. Medications: Ferrous sulfate daily, Multivitamin with minerals daily  Diet Order:  Diet Heart Room service appropriate?: Yes; Fluid consistency:: Thin  Skin:  Reviewed, no issues  Last BM:  7/11  Height:   Ht Readings from Last 1 Encounters:  07/06/16 5\' 4"   (1.626 m)    Weight:   Wt Readings from Last 1 Encounters:  07/08/16 107 lb 2.3 oz (48.6 kg)    Ideal Body Weight:  54.5 kg  BMI:  Body mass index is 18.38 kg/(m^2).  Estimated Nutritional Needs:   Kcal:  1400-1600  Protein:  65-75g  Fluid:  1.6L/day  EDUCATION NEEDS:   Education needs addressed  Clayton Bibles, MS, RD, LDN Pager: [phone] After Hours Pager: [phone]

## 2016-07-08 NOTE — Progress Notes (Signed)
Patient Profile: 82 y.o. female with a past medical history significant for breast cancer, systolic LV dysfunction due to presumed non-ischemic cardiomyopathy with recovery of her EF. She was admitted 07/06/16 for acute onset of chest/ back pain. Minimally elevated troponins. Incidental findings of a left ovarian mass on CT scan. Intermediate risk NST.   Subjective: Resting in bed. Denies any symptoms currently. Undecided on cath at this point.    Objective: Vital signs in last 24 hours: Temp:  [97.7 F (36.5 C)-98.6 F (37 C)] 98.6 F (37 C) (07/13 0542) Pulse Rate:  [85-92] 87 (07/13 0542) Resp:  [20-22] 20 (07/13 0542) BP: (148-158)/(58-89) 149/62 mmHg (07/13 0542) SpO2:  [98 %-100 %] 98 % (07/13 0542) Weight:  [107 lb 2.3 oz (48.6 kg)] 107 lb 2.3 oz (48.6 kg) (07/13 0500) Last BM Date: 07/06/16  Intake/Output from previous day: 07/12 0701 - 07/13 0700 In: 0  Out: 400 [Urine:400] Intake/Output this shift:    Medications Current Facility-Administered Medications  Medication Dose Route Frequency Provider Last Rate Last Dose  . acetaminophen (TYLENOL) tablet 650 mg  650 mg Oral Q4H PRN Clanford Marisa Hua, MD      . aspirin EC tablet 81 mg  81 mg Oral Daily Clanford Marisa Hua, MD   81 mg at 07/07/16 1457  . enoxaparin (LOVENOX) injection 40 mg  40 mg Subcutaneous Q24H Clanford Marisa Hua, MD   40 mg at 07/07/16 2100  . ferrous sulfate tablet 325 mg  325 mg Oral QHS Clanford Marisa Hua, MD   325 mg at 07/07/16 2100  . gi cocktail (Maalox,Lidocaine,Donnatal)  30 mL Oral QID PRN Clanford Marisa Hua, MD      . metoprolol tartrate (LOPRESSOR) tablet 25 mg  25 mg Oral BID Clanford Marisa Hua, MD   25 mg at 07/07/16 2100  . morphine 2 MG/ML injection 1 mg  1 mg Intravenous Q4H PRN Clanford Marisa Hua, MD      . multivitamin with minerals tablet 1 tablet  1 tablet Oral Daily Clanford Marisa Hua, MD   1 tablet at 07/07/16 1458  . nitroGLYCERIN (NITROSTAT) SL tablet 0.4 mg  0.4 mg Sublingual  Q5 min PRN Clanford L Johnson, MD      . ondansetron (ZOFRAN) injection 4 mg  4 mg Intravenous Q6H PRN Clanford Marisa Hua, MD      . pantoprazole (PROTONIX) EC tablet 40 mg  40 mg Oral Q0600 Clanford Marisa Hua, MD   40 mg at 07/08/16 0527    PE: General appearance: alert, cooperative, no distress and elderly and frail Neck: no carotid bruit and no JVD Lungs: clear to auscultation bilaterally Heart: regular rate and rhythm, 2/6 murmur at LUSB Extremities: no LEE Pulses: 2+ and symmetric Skin: warm and dry Neurologic: Grossly normal  Lab Results:   Recent Labs  07/06/16 1426 07/07/16 0152  WBC 9.1 9.8  HGB 8.6* 8.8*  HCT 26.1* 27.3*  PLT 225 216   BMET  Recent Labs  07/06/16 1426 07/07/16 0152  NA 140 137  K 4.8 4.2  CL 116* 110  CO2 19* 22  GLUCOSE 111* 86  BUN 26* 26*  CREATININE 0.93 1.05*  CALCIUM 10.1 10.2   PT/INR No results for input(s): LABPROT, INR in the last 72 hours. Cholesterol  Recent Labs  07/06/16 1954  CHOL 101   Cardiac Panel (last 3 results)  Recent Labs  07/06/16 1954 07/07/16 0152  TROPONINI 0.05* 0.04*    Studies/Results: CT of chest 07/06/16  IMPRESSION: No evidence of aortic dissection or pulmonary embolism.  Complex appearing lesion in the expected region of the left ovary. Given the patient's age this is suspicious for underlying neoplasm and further workup is recommended.  Rounded area of soft tissue within the right lower lobe posterior to the carina as described. Followup imaging is recommended. These changes may also be related to the left ovary.  Cystic area adjacent to the pancreas which appears separate from the pancreas but may be related to the changes in the left ovary.  2D Echo 07/07/16 Study Conclusions  - Left ventricle: The cavity size was normal. There was severe  focal basal and moderate concentric hypertrophy of the left  ventricle . Systolic function was normal. The estimated ejection  fraction  was in the range of 60% to 65%. Wall motion was normal;  there were no regional wall motion abnormalities. Doppler  parameters are consistent with abnormal left ventricular  relaxation (grade 1 diastolic dysfunction). Doppler parameters  are consistent with elevated ventricular end-diastolic filling  pressure. - Aortic valve: There was moderate regurgitation. - Aortic root: The aortic root was mildly dilated measuring 40 mm. - Mitral valve: Calcified annulus. Mildly thickened leaflets .  There was moderate regurgitation. - Left atrium: The atrium was severely dilated. - Tricuspid valve: There was mild regurgitation. - Pulmonic valve: There was mild regurgitation. - Pulmonary arteries: Systolic pressure was within the normal  range.  NST 07/07/16 FINDINGS: Perfusion: There is a moderate area of mildly decreased activity involving the inferior wall of the left ventricular myocardium on both the rest and stress deep studies. No suggestion of reversible ischemia.  Wall Motion: Global hypokinesis particularly in the set him. No left ventricular dilation.  Left Ventricular Ejection Fraction: 44 %  End diastolic volume 123456 ml  End systolic volume 67 ml  IMPRESSION: 1. No reversible ischemia. Inferior wall attenuation or scar.  2. Global hypokinesis most marked in the septum.  3. Left ventricular ejection fraction 44%  4. Intermediate-risk stress test findings*.   Assessment/Plan  Principal Problem:   Chest pain Active Problems:   HYPERTENSION, BENIGN SYSTEMIC   Iron deficiency anemia   Chronic systolic CHF (congestive heart failure) (HCC)   Abnormal EKG   Ovarian mass, left   1. Chest Pain: Chest CT negative for dissection and PE. Troponins cycled x 3 with flat low level trend, 0.05>>0.04. 2D echo showed normal EF, however NST with intermediate risk findings. Definitive LHC recommended. Stress test results and purpose of cath discussed with patient.  Patient undecided on cath at this point. She wants more time to think about it. MD to discuss further with patient on rounds.   2. Abnormal CT Findings: CT ruled out acute cardiac/pulmonary pathology, however there were incidental findings of complex appearing lesion in the expected region of the left ovary, concerning for underlying neoplasm. Further w/u per primary team.   3. Iron Deficiency Anemia: Hgb 8.8. Management per IM.   4. H/o NICM: she has a h/o systolic LV dysfunction due to presumed non-ischemic cardiomyopathy with recovery of her EF in the past. F/u 2D echo 7/12 showed EF of 60-65% + normal wall motion. EF by NST estimate was 44%.   Marwin Primmer M. Ladoris Gene 07/08/2016 7:43 AM

## 2016-07-08 NOTE — Progress Notes (Signed)
Patient is A&O x3, ambulatory with assist. Family at bedside. Discharge instruction reviewed. Family denied questions, concerns r/t instruction. Family awaiting CD from radiology

## 2016-07-08 NOTE — Plan of Care (Signed)
Problem: Phase II Progression Outcomes Goal: Cath/PCI Day Path if indicated Pt refused at this time. Pt to be discharged and will f/u

## 2016-08-04 ENCOUNTER — Encounter: Payer: Self-pay | Admitting: Obstetrics & Gynecology

## 2016-08-04 ENCOUNTER — Ambulatory Visit (INDEPENDENT_AMBULATORY_CARE_PROVIDER_SITE_OTHER): Payer: Medicare Other | Admitting: Obstetrics & Gynecology

## 2016-08-04 VITALS — BP 134/64 | HR 86 | Ht 62.0 in | Wt 111.0 lb

## 2016-08-04 DIAGNOSIS — N838 Other noninflammatory disorders of ovary, fallopian tube and broad ligament: Secondary | ICD-10-CM

## 2016-08-04 DIAGNOSIS — N839 Noninflammatory disorder of ovary, fallopian tube and broad ligament, unspecified: Secondary | ICD-10-CM | POA: Diagnosis not present

## 2016-08-04 DIAGNOSIS — C569 Malignant neoplasm of unspecified ovary: Secondary | ICD-10-CM | POA: Diagnosis not present

## 2016-08-04 NOTE — Progress Notes (Signed)
   Subjective:    Patient ID: Valerie Downs, female    DOB: 1930/04/15, 82 y.o.   MRN: [id number]  HPI  82 yo W AA P10 (9 great grand kids) here for follow up after a complex left ovarian mass was seen incidentally on a CT done during her hospital admission recently for chest pain. She denies any pelvic pain or discomfort.  Review of Systems She has been abstinent since her husband died 69 years ago.    Objective:   Physical Exam Frail AA lady, pleasant Breathing, conversing, and ambulating normally Abd- benign       Assessment & Plan:  Left ovarian mass- check CA-125 and gyn u/s RTC 2 weeks

## 2016-08-05 LAB — CA 125: CA 125: 1074 U/mL — ABNORMAL HIGH (ref <35)

## 2016-08-11 ENCOUNTER — Ambulatory Visit (INDEPENDENT_AMBULATORY_CARE_PROVIDER_SITE_OTHER): Payer: Medicare Other | Admitting: Obstetrics & Gynecology

## 2016-08-11 ENCOUNTER — Ambulatory Visit (HOSPITAL_COMMUNITY)
Admission: RE | Admit: 2016-08-11 | Discharge: 2016-08-11 | Disposition: A | Discharge status: Home / Self Care | Payer: Medicare Other | Source: Ambulatory Visit | Attending: Obstetrics & Gynecology | Admitting: Obstetrics & Gynecology

## 2016-08-11 DIAGNOSIS — C569 Malignant neoplasm of unspecified ovary: Secondary | ICD-10-CM

## 2016-08-11 DIAGNOSIS — R938 Abnormal findings on diagnostic imaging of other specified body structures: Secondary | ICD-10-CM | POA: Insufficient documentation

## 2016-08-11 DIAGNOSIS — N839 Noninflammatory disorder of ovary, fallopian tube and broad ligament, unspecified: Secondary | ICD-10-CM | POA: Insufficient documentation

## 2016-08-11 DIAGNOSIS — N83202 Unspecified ovarian cyst, left side: Secondary | ICD-10-CM | POA: Diagnosis not present

## 2016-08-11 DIAGNOSIS — N838 Other noninflammatory disorders of ovary, fallopian tube and broad ligament: Secondary | ICD-10-CM

## 2016-08-11 NOTE — Progress Notes (Signed)
   Subjective:    Patient ID: Valerie Downs, female    DOB: [birth year], 82 y.o.   MRN: [id number]  HPI 82 yo AA lady here to discuss her u/s and CA-125 results. She reports some constipation, 30# weight loss in the last 6 months.   Review of Systems     Objective:   Physical Exam Thin BF Breathing, conversing, and ambulating normally CA-125 greater than 1000\ U/s with complex mass       Assessment & Plan:  Probable ovarian cancer- she has an appt with Dr. Nancy Marus 08-25-16 She has a h/o breast cancer and 4 daughters. I have suggested genetic testing.

## 2016-08-25 ENCOUNTER — Encounter: Payer: Self-pay | Admitting: Gynecologic Oncology

## 2016-08-25 ENCOUNTER — Ambulatory Visit: Payer: Medicare Other | Attending: Gynecologic Oncology | Admitting: Gynecologic Oncology

## 2016-08-25 VITALS — BP 146/60 | HR 71 | Temp 97.8°F | Resp 18 | Ht 63.0 in | Wt 114.0 lb

## 2016-08-25 DIAGNOSIS — R971 Elevated cancer antigen 125 [CA 125]: Secondary | ICD-10-CM

## 2016-08-25 DIAGNOSIS — Z7982 Long term (current) use of aspirin: Secondary | ICD-10-CM | POA: Insufficient documentation

## 2016-08-25 DIAGNOSIS — Z8249 Family history of ischemic heart disease and other diseases of the circulatory system: Secondary | ICD-10-CM | POA: Insufficient documentation

## 2016-08-25 DIAGNOSIS — N839 Noninflammatory disorder of ovary, fallopian tube and broad ligament, unspecified: Secondary | ICD-10-CM | POA: Diagnosis not present

## 2016-08-25 DIAGNOSIS — I509 Heart failure, unspecified: Secondary | ICD-10-CM | POA: Insufficient documentation

## 2016-08-25 DIAGNOSIS — N838 Other noninflammatory disorders of ovary, fallopian tube and broad ligament: Secondary | ICD-10-CM

## 2016-08-25 DIAGNOSIS — I340 Nonrheumatic mitral (valve) insufficiency: Secondary | ICD-10-CM | POA: Insufficient documentation

## 2016-08-25 DIAGNOSIS — Z853 Personal history of malignant neoplasm of breast: Secondary | ICD-10-CM | POA: Insufficient documentation

## 2016-08-25 DIAGNOSIS — Z87891 Personal history of nicotine dependence: Secondary | ICD-10-CM | POA: Diagnosis not present

## 2016-08-25 DIAGNOSIS — Z9221 Personal history of antineoplastic chemotherapy: Secondary | ICD-10-CM | POA: Diagnosis not present

## 2016-08-25 DIAGNOSIS — D509 Iron deficiency anemia, unspecified: Secondary | ICD-10-CM | POA: Insufficient documentation

## 2016-08-25 DIAGNOSIS — Z880 Allergy status to penicillin: Secondary | ICD-10-CM | POA: Insufficient documentation

## 2016-08-25 DIAGNOSIS — I429 Cardiomyopathy, unspecified: Secondary | ICD-10-CM | POA: Diagnosis not present

## 2016-08-25 DIAGNOSIS — I351 Nonrheumatic aortic (valve) insufficiency: Secondary | ICD-10-CM | POA: Insufficient documentation

## 2016-08-25 DIAGNOSIS — Z9012 Acquired absence of left breast and nipple: Secondary | ICD-10-CM | POA: Insufficient documentation

## 2016-08-25 DIAGNOSIS — I110 Hypertensive heart disease with heart failure: Secondary | ICD-10-CM | POA: Insufficient documentation

## 2016-08-25 NOTE — Progress Notes (Signed)
Consult Note: Gyn-Onc  Valerie Downs 82 y.o. female  CC: No chief complaint on file.   HPI:  Patient is seen today in consultation request of Dr. Hulan Fray and elevated CA-125 and a complex lesion on the ovary.   This is all discover when the patient was admitted with chest pain and had a CT scan that found these changes incidentally.   Patient is a 82 year old with multiple medical problems including: Hypertension, iron just deficiency anemia, chronic congestive heart failure.  During that hospitalization she was noted to have a decreased ejection fraction of 44% however, the echo showed preserved ejection fraction. She was seen by Dr. Debara Pickett on the day of discharge. His impression included: "Stress test showed mostly fixed inferior defect, suggestive of scar or artifact, however, echo did not show inferior scar with normal LV function. While this could be ischemia, it is less likely. I would favor artifact. We discussed cath as a definitive way to assess risk prior to possible surgery for her ovarian mass, however, her risk for surgery would be low to intermediate without cath, therefore, it would be reasonable to not have cath as well. She is not interested, and therefore, I will not push for it."  CT 07/06/16: The lungs are well aerated bilaterally. There is a rounded area soft tissue density measuring approximately 2 cm just posterior to the carina within the right lower lobe. This likely represents an area of round pneumonia although follow-up examination is recommended to assess for resolution. No other nodules are seen. No focal infiltrate or sizable effusion is seen.  The thoracic inlet is within normal limits. No significant hilar or mediastinal adenopathy is noted. The osseous structures show degenerative change of the thoracic spine. No acute bony abnormality is seen.  Vascular: The thoracic aorta and its branches are well visualized. Dilatation of the ascending aorta to 5 cm is noted.  Mild coronary calcifications are seen. No findings of dissection are noted. The pulmonary artery demonstrates a normal branching pattern. No findings to suggest pulmonary embolus are noted. The cardiac structures are otherwise within normal limits.  CTA ABDOMEN AND PELVIS FINDINGS  Vascular: The aorta shows atherosclerotic calcifications although no aneurysmal dilatation is seen. No findings of dissection are noted. No focal areas of narrowing are seen.  The celiac axis, superior mesenteric artery and inferior mesenteric artery are all widely patent. Single renal artery is identified on the right with dual renal arteries on the left.  Nonvascular:  The liver, spleen, adrenal glands and pancreas are within normal limits. Kidneys demonstrates some cystic change. No obstructive changes are noted. No calculi are identified. Just beneath the pancreas there is a small cystic appearing area which appears separate from the pancreas. May be related to prior inflammatory change. It measures 2.3 cm and is best seen on image number 96 of series 5. The appendix is within normal limits. The bladder is well distended.  Multiple calcified uterine fibroids are seen. There is a predominately cystic appearing mass lesion arising in the expected area of the left ovary. It demonstrates some adjacent soft tissue component and measures approximately 7.8 by 3.7 cm in greatest dimension. Given the patient's age these changes are suspicious for underlying neoplasm. Further workup is recommended. No significant lymphadenopathy is identified. The changes previously described adjacent to the pancreas may be related to this lesion.  IMPRESSION: No evidence of aortic dissection or pulmonary embolism.  Complex appearing lesion in the expected region of the left ovary. Given the patient's  age this is suspicious for underlying neoplasm and further workup is recommended.  Rounded area of soft tissue within the right lower  lobe posterior to the carina as described. Followup imaging is recommended. These changes may also be related to the left ovary.  Cystic area adjacent to the pancreas which appears separate from the pancreas but may be related to the changes in the left ovary.IMPRESSION: 1. 7.7 x 2.9 x 5.6 cm complex left ovarian mass thick septations. A component of this mass may represent ovary. Ovarian malignancy cannot be excluded. Right ovary not visualized. Gynecologic evaluation suggested.  2. Endometrial thickening at 10 mm. Endometrial thickness is considered abnormal for an asymptomatic post-menopausal female. Endometrial sampling should be considered to exclude carcinoma.  Her CA-125 was markedly elevated at 1074.  She comes accompanied by her daughter today. She is a. 10. She is a full code and I reviewed that with him today. She states that today she has some burning in her stomach she's had that for a few days. This is different than her chest pain. She feels like a discomfort that she would have if she ate too many greasy foods. She denies any change in her bowel or bladder habits. She has a history of anemia and used heat a lot of deferred at which time she'll have constipation. She now she was a lot of ice. She denies any nausea or vomiting. She denies any chest pain or shortness of breath. She cannot go up a flight of stairs but this is secondary to peripheral fracture on her left foot that she suffered at the beginning of the summer that limits her activity. She does have a personal history of breast cancer and had a left sided mastectomy age of 45. She's had no evidence recurrence since that time. She did not have any adjuvant treatment. She is a first cousin who had cancer in her stomach. Her father died of cancer she does not know what type.  Review of Systems  Constitutional: She has lost approximately 26 pounds unintentionally since November. She states her appetite is starting to  increase. Skin: No rash Cardiovascular: No chest pain, shortness of breath, or edema  Pulmonary: No cough  Gastro Intestinal: Reporting intermittent upper abdominal soreness.  No nausea, vomiting, constipation, or diarrhea reported. No bright red blood per rectum. Genitourinary: No frequency, urgency, or dysuria.  Denies vaginal bleeding and discharge.  Musculoskeletal: + left foot pain Neurologic: No weakness    Current Meds:  Outpatient Encounter Prescriptions as of 08/25/2016  Medication Sig  . aspirin EC 81 MG EC tablet Take 1 tablet (81 mg total) by mouth daily.  . ferrous sulfate 325 (65 FE) MG tablet Take 325 mg by mouth at bedtime.   Marland Kitchen losartan (COZAAR) 25 MG tablet Take 25 mg by mouth daily.  . metoprolol tartrate (LOPRESSOR) 25 MG tablet Take 1 tablet (25 mg total) by mouth 2 (two) times daily.  . Multiple Vitamins-Minerals (CENTRUM SILVER 50+WOMEN) TABS Take 1 tablet by mouth daily.   No facility-administered encounter medications on file as of 08/25/2016.     Allergy:  Allergies  Allergen Reactions  . Penicillins Anaphylaxis and Swelling    Has patient had a PCN reaction causing immediate rash, facial/tongue/throat swelling, SOB or lightheadedness with hypotension: Yes Has patient had a PCN reaction causing severe rash involving mucus membranes or skin necrosis: Yes Has patient had a PCN reaction that required hospitalization Yes Has patient had a PCN reaction occurring within the last  10 years: No If all of the above answers are "NO", then may proceed with Cephalosporin use.     Social Hx:   Social History   Social History  . Marital status: Widowed    Spouse name: N/A  . Number of children: N/A  . Years of education: N/A   Occupational History  . Not on file.   Social History Main Topics  . Smoking status: Former Smoker    Years: 50.00    Types: Cigarettes    Quit date: 06/25/2012  . Smokeless tobacco: Current User    Types: Snuff  . Alcohol use No  .  Drug use: No  . Sexual activity: No   Other Topics Concern  . Not on file   Social History Narrative  . No narrative on file    Past Surgical Hx:  Past Surgical History:  Procedure Laterality Date  . COLONOSCOPY N/A 06/22/2014   Procedure: COLONOSCOPY;  Surgeon: Jerene Bears, MD;  Location: [facility]COPY;  Service: Endoscopy;  Laterality: N/A;  . ESOPHAGOGASTRODUODENOSCOPY N/A 06/22/2014   Procedure: ESOPHAGOGASTRODUODENOSCOPY (EGD);  Surgeon: Jerene Bears, MD;  Location: [facility];  Service: Endoscopy;  Laterality: N/A;  . MASTECTOMY Left     Past Medical Hx:  Past Medical History:  Diagnosis Date  . Aortic insufficiency   . Breast cancer ([place])   . Cardiomyopathy ([place])   . CHF (congestive heart failure) ([place])   . Hypertension   . Mitral regurgitation     Oncology Hx:   No history exists.    Family Hx:  Family History  Problem Relation Age of Onset  . Hypertension Mother     Vitals:  There were no vitals taken for this visit.  Physical Exam  Neck: Well-developed female in no acute distress. Bilateral temporal wasting.  Neck: No lymphadenopathy.  Lungs: Clear to auscultation but he.  Cardiac Astro: Regular rate and rhythm.  Abdomen: Soft, nontender, nondistended. There are no palpable masses. There is no hepatomegaly. There is no fluid wave. There is no rebound or guarding.  Groins: No lymphadenopathy.  Extremities: No edema.  Pelvic: External genitalia is within normal limits. Vagina is markedly atrophic. The cervix is visualized. There's no visible lesions. There is no discharge. Bimanual examination reveals a slightly enlarged uterus for age. There is approximate 97 m mass that is firm hard but mobile in the left adnexa. Rectal confirms. There is no nodularity in the rectovaginal septum.  Assessment/Plan:   82 year old para 10 with a complex left adnexal mass a markedly elevated CA-125. The constellation of the imaging findings, the pelvic mass and a  CA-125 are somewhat incongruent. It would be unusual to see such a markedly elevated CA-125 in the absence of metastatic disease. However, the pattern of metastatic disease with the 2.3 cm para pancreatic cyst and the lesion above the carina is a bit unusual. I spoke with the patient and her daughter. I believe we need to biopsy the lesion that radiology thinks is the most suspicious. If these are biopsy positive for cancer I would recommend proceeding with chemotherapy and not proceeding directly with surgery. If the biopsy is negative and there is no evidence of extraovarian disease then I believe removal of the adnexa would be reasonable. Cardiology has felt that she was at a reasonable risk forced this type of surgery. She was seen by Dr. Debara Pickett at the time of her discharge from the hospital.  The patient and her family despite her functional status and  age would like to have everything done including surgery and chemotherapy if indicated. She is a full code.  We'll be contacted by the Hospital scheduled for biopsy. We will have her come back to see [place] after the biopsy to determine treatment planning. We will attempt to expedite this as soon as possible.   Her questions as well as those of her daughter were elicited in answer to her satisfaction. Valerie Downs A., MD 08/25/2016, 9:35 AM

## 2016-08-25 NOTE — Patient Instructions (Signed)
You will receive a call from the hospital to schedule the CT scan , plan to follow up after the CT scan to discuss the results. We will contact you to schedule the follow up appointment with Dr Nancy Marus [facility]: [phone]   Thank you !

## 2016-08-26 ENCOUNTER — Other Ambulatory Visit: Payer: Self-pay | Admitting: Gynecologic Oncology

## 2016-08-26 DIAGNOSIS — R911 Solitary pulmonary nodule: Secondary | ICD-10-CM

## 2016-08-26 DIAGNOSIS — N83299 Other ovarian cyst, unspecified side: Secondary | ICD-10-CM

## 2016-08-27 ENCOUNTER — Encounter: Payer: Self-pay | Admitting: Gynecologic Oncology

## 2016-08-27 ENCOUNTER — Telehealth: Payer: Self-pay | Admitting: Gynecologic Oncology

## 2016-08-27 ENCOUNTER — Telehealth: Payer: Self-pay | Admitting: *Deleted

## 2016-08-27 NOTE — Telephone Encounter (Signed)
Spoke with patient about the plan to proceed with a PET scan per radiologist's recommendations.  Verbalizing understanding.  Attempted to inform daughter but unavailable.  Advised she would be contacted with the information for her scan.

## 2016-08-27 NOTE — Telephone Encounter (Signed)
Notified patient's daughter of future PET scan appointment. The patient is scheduled on sept 13,2017 at [facility]. Patient's daughter was advised to arrive at 8:45 and the patient is to have nothing to eat or drink 6 hours prior. Daughter agreed with appointment time and date and voiced understanding of instructions

## 2016-08-27 NOTE — Telephone Encounter (Signed)
Attempted to call pt and daughter to discuss plan for PET scan.  Left message on daughter's cell.

## 2016-09-08 ENCOUNTER — Encounter (HOSPITAL_COMMUNITY)
Admission: RE | Admit: 2016-09-08 | Discharge: 2016-09-08 | Disposition: A | Discharge status: Home / Self Care | Payer: Medicare Other | Source: Ambulatory Visit | Attending: Gynecologic Oncology | Admitting: Gynecologic Oncology

## 2016-09-08 ENCOUNTER — Other Ambulatory Visit: Payer: Self-pay | Admitting: Gynecologic Oncology

## 2016-09-08 DIAGNOSIS — R911 Solitary pulmonary nodule: Secondary | ICD-10-CM

## 2016-09-08 DIAGNOSIS — N83299 Other ovarian cyst, unspecified side: Secondary | ICD-10-CM | POA: Insufficient documentation

## 2016-09-08 DIAGNOSIS — J984 Other disorders of lung: Secondary | ICD-10-CM | POA: Diagnosis not present

## 2016-09-08 DIAGNOSIS — R918 Other nonspecific abnormal finding of lung field: Secondary | ICD-10-CM | POA: Diagnosis not present

## 2016-09-08 LAB — GLUCOSE, CAPILLARY: Glucose-Capillary: 95 mg/dL (ref 65–99)

## 2016-09-08 MED ORDER — FLUDEOXYGLUCOSE F - 18 (FDG) INJECTION
5.5900 | Freq: Once | INTRAVENOUS | Status: AC | PRN
  Administered 2016-09-08: 5.59 via INTRAVENOUS

## 2016-09-09 ENCOUNTER — Telehealth: Payer: Self-pay | Admitting: Gynecologic Oncology

## 2016-09-09 ENCOUNTER — Telehealth: Payer: Self-pay | Admitting: *Deleted

## 2016-09-09 DIAGNOSIS — R911 Solitary pulmonary nodule: Secondary | ICD-10-CM

## 2016-09-09 NOTE — Telephone Encounter (Signed)
Called patient and informed her of PET scan results along with Dr. Elenora Gamma recommendations for a lung biopsy to confirm diagnosis.  Patient states she does not have a pulmonologist.  Referral will be placed and we will contact her with a date and time.  No concerns voiced.  Advised to call for any needs or concerns.

## 2016-09-09 NOTE — Telephone Encounter (Signed)
Notified pt of scheduled appointment at [facility] . Pt is has a appointment scheduled for  Sept 21,2017 At 2:00pm with Dr. Alvina Filbert. Pt agreed with time and date of appointment.

## 2016-09-16 ENCOUNTER — Ambulatory Visit (INDEPENDENT_AMBULATORY_CARE_PROVIDER_SITE_OTHER): Payer: Medicare Other | Admitting: Internal Medicine

## 2016-09-16 ENCOUNTER — Encounter: Payer: Self-pay | Admitting: Internal Medicine

## 2016-09-16 ENCOUNTER — Ambulatory Visit (INDEPENDENT_AMBULATORY_CARE_PROVIDER_SITE_OTHER)
Admission: RE | Admit: 2016-09-16 | Discharge: 2016-09-16 | Disposition: A | Discharge status: Home / Self Care | Payer: Medicare Other | Source: Ambulatory Visit | Attending: Internal Medicine | Admitting: Internal Medicine

## 2016-09-16 DIAGNOSIS — R911 Solitary pulmonary nodule: Secondary | ICD-10-CM

## 2016-09-16 DIAGNOSIS — F17210 Nicotine dependence, cigarettes, uncomplicated: Secondary | ICD-10-CM

## 2016-09-16 DIAGNOSIS — R0600 Dyspnea, unspecified: Secondary | ICD-10-CM

## 2016-09-16 DIAGNOSIS — R0602 Shortness of breath: Secondary | ICD-10-CM | POA: Diagnosis not present

## 2016-09-16 NOTE — Progress Notes (Signed)
Subjective:    Patient ID: Valerie Downs, female    DOB: Sep 05, 1930,    MRN: [id number]  HPI  82 yobf active smoker with variable sob more often while  lying down than  Walking but very limited since   around 2014 [facility]/13 and referred to pulmonary clinic 09/16/2016 by NP for SPN in setting of ovarian mass   Admit date: 07/06/2016 Discharge date: 07/08/2016  Admitted From: home  Disposition:  home   Recommendations for Outpatient Follow-up:  1. F/u on ovarian mass 2. bmet in 1 wk- resume Losartan if Cr back to baseline  Home Health:  no  Equipment/Devices:  none  Discharge Condition:  stable   CODE STATUS:  Full code   Diet recommendation:   Heart healthy Consultations:  cardiology    Discharge Diagnoses:  Principal Problem:   Chest pain Active Problems:   HYPERTENSION, BENIGN SYSTEMIC   Iron deficiency anemia   Chronic systolic CHF (congestive heart failure) (HCC)   Ovarian mass, left   Brief Summary: 82 y/o with HTN and nicotine abuse presents for chest pain described as heaviness in her left chest.   Hospital Course:  Principal Problem:  Chest pain - myoview shows a scar and EF of 40 %- cardiology recommended a cath- patient has declined this - will discharge on a baby aspirin daily- already on Metoprolol - ECHO does not show a scar and shows a normal EF- see report below - CT negative for PE  Active Problems:  Ovarian mass, left - noted incidentally on CTA - has possible lesions in RLL and adjacent to pancrease as well - have discussed with patient and daughters- she will take CD of CT scan with her to Gyn for further work up  Mild renal insufficiency - see labs below - Losartan on hold during hospital stay- she refused a blood draw today   HYPERTENSION, BENIGN SYSTEMIC - Metoprolol - Losartan on hold   Iron deficiency anemia - ferrous sulfate   Chronic systolic CHF (congestive heart failure)- NICM -  compensated- see ECHO below - on Losartan (held) and Metoprolol   09/16/2016 82[facility] office visit/ Alban Marucci   Chief Complaint  Patient presents with  . Pulmonary Consult    Referred by Dr. Joylene John for eval of pulmonary nodule. Pt c/o DOE x 2 months. She gets winded just walking from room to room at home.   episodes at rest  [facility] = can't walk 100 yards even at a slow pace at a flat grade s stopping due to sob  eg shopping   No obvious day to day or daytime variability or assoc excess/ purulent sputum or mucus plugs or hemoptysis or cp or chest tightness, subjective wheeze or overt sinus or hb symptoms. No unusual exp hx or h/o childhood pna/ asthma or knowledge of premature birth.  Sleeping ok without nocturnal  or early am exacerbation  of respiratory  c/o's or need for noct saba. Also denies any obvious fluctuation of symptoms with weather or environmental changes or other aggravating or alleviating factors except as outlined above   Current Medications, Allergies, Complete Past Medical History, Past Surgical History, Family History, and Social History were reviewed in [employer] record.           Review of Systems  Constitutional: Positive for appetite change and unexpected weight change. Negative for chills and fever.  HENT: Positive for dental problem. Negative for congestion, ear pain, nosebleeds,  postnasal drip, rhinorrhea, sinus pressure, sneezing, sore throat, trouble swallowing and voice change.   Eyes: Negative for visual disturbance.  Respiratory: Positive for shortness of breath. Negative for cough and choking.   Cardiovascular: Negative for chest pain and leg swelling.  Gastrointestinal: Negative for abdominal pain, diarrhea and vomiting.  Genitourinary: Negative for difficulty urinating.  Musculoskeletal: Negative for arthralgias.  Skin: Negative for rash.  Neurological: Negative for tremors, syncope and headaches.  Hematological:  Does not bruise/bleed easily.       Objective:   Physical Exam  82 somber bf nad  Wt Readings from Last 3 Encounters:  09/16/16 113 lb (51.3 kg)  08/25/16 114 lb (51.7 kg)  08/04/16 111 lb (50.3 kg)    Vital signs reviewed - sats 99% on RA on arrival   HEENT: nl dentition, turbinates, and oropharynx. Nl external ear canals without cough reflex   NECK :  without JVD/Nodes/TM/ nl carotid upstrokes bilaterally   LUNGS: no acc muscle use,  Nl contour chest which is clear to A and P bilaterally without cough on insp or exp maneuvers   CV:  RRR  no s3 or murmur or increase in P2, no edema   ABD:  soft and nontender with nl inspiratory excursion in the supine position. No bruits or organomegaly, bowel sounds nl  MS:  Nl gait/ ext warm without deformities, calf tenderness, cyanosis or clubbing No obvious joint restrictions   SKIN: warm and dry without lesions    NEURO:  alert, approp, nl sensorium with  no motor deficits      CXR PA and Lateral:   09/16/2016 :    I personally reviewed images and agree with radiology impression as follows:    Nodule posterior to the right hilum by recent PET-CT has been occult by plain film and is again not seen.  No evidence of acute disease.   PET 09/08/16  The 2 cm right lower lobe pulmonary lesion in the azygoesophageal recess is hypermetabolic with SUV max of [id number]. This is consistent with neoplasm. There are also hypermetabolic right paratracheal and left prevascular nodes. The right-sided paratracheal node measures 13 mm and SUV max is 5.7. The aorticopulmonary window node measures 10.5 mm and SUV max is 5.3.       Assessment & Plan:

## 2016-09-16 NOTE — Patient Instructions (Addendum)
I will set up a biopsy for you next week   Please remember to go to the  x-ray department downstairs for your tests - we will call you with the results when they are available.  The key is to stop smoking completely before smoking completely stops you!

## 2016-09-17 DIAGNOSIS — R0602 Shortness of breath: Secondary | ICD-10-CM | POA: Insufficient documentation

## 2016-09-17 DIAGNOSIS — F17210 Nicotine dependence, cigarettes, uncomplicated: Secondary | ICD-10-CM | POA: Insufficient documentation

## 2016-09-17 NOTE — Progress Notes (Signed)
Spoke with pt and notified of results per Dr. Wert. Pt verbalized understanding and denied any questions. 

## 2016-09-17 NOTE — Assessment & Plan Note (Signed)
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  Options for rx would be chemo and RT but but they would be palliative at best.   Best approach would be to do EBUS if/ when it becomes more apparent than it is to me now to address this issue and after ob/gyn w/u is complete.   Discussed in detail all the  indications, usual  risks and alternatives  relative to the benefits with patient who agrees to proceed with conservative f/u as outlined  For now   Total time devoted to counseling  = 35/43m review case with pt/ discussion of options/alternatives/ personally creating written instructions  in presence of pt  then going over those specific  Instructions directly with the pt including how to use all of the meds but in particular covering each new medication in detail and the difference between the maintenance/automatic meds and the prns using an action plan format for the latter.

## 2016-09-17 NOTE — Assessment & Plan Note (Signed)
Echo 07/07/16 Left ventricle: The cavity size was normal. There was severe   focal basal and moderate concentric hypertrophy of the left   ventricle . Systolic function was normal. The estimated ejection   fraction was in the range of 60% to 65%. Wall motion was normal;   there were no regional wall motion abnormalities. Doppler   parameters are consistent with abnormal left ventricular   relaxation (grade 1 diastolic dysfunction). Doppler parameters   are consistent with elevated ventricular end-diastolic filling   pressure. - Aortic valve: There was moderate regurgitation. - Aortic root: The aortic root was mildly dilated measuring 40 mm. - Mitral valve: Calcified annulus. Mildly thickened leaflets .   There was moderate regurgitation. - Left atrium: The atrium was severely dilated. - Tricuspid valve: There was mild regurgitation. - Pulmonic valve: There was mild regurgitation. - Pulmonary arteries: Systolic pressure was within the normal   range.    No pfts on file but I suspect most of her "spells" of sob are chf/ not copd exacerbations and pose significant risk for any surgical procedure, esp in pt with recent cp who has refused LHC  Will do spirometry if/ when returns to consider EBUS

## 2016-09-17 NOTE — Assessment & Plan Note (Signed)
>   3 min Discussed the risks and costs (both direct and indirect)  of smoking relative to the benefits of quitting but patient unwilling to commit at this point to a specific quit date.    Although I don't endorse regular use of e cigs/ many pts find them helpful; however, I emphasized they should be considered a "one-way bridge" off all tobacco products.  

## 2016-09-29 ENCOUNTER — Telehealth: Payer: Self-pay | Admitting: Internal Medicine

## 2016-09-29 ENCOUNTER — Telehealth: Payer: Self-pay | Admitting: Gynecologic Oncology

## 2016-09-29 NOTE — Telephone Encounter (Signed)
Per MW- pt needs ov with him with spirometry first available  ATC the pt, NA and no option to leave msg, [facility]

## 2016-09-29 NOTE — Telephone Encounter (Signed)
Spoke with the patient's daughter.  Appt made for follow up with GYN ONC on Tuesday, October 10.  Dr. Alycia Rossetti had spoken with Dr. Melvyn Novas, who was also planning on getting the patient back in for evaluation.

## 2016-10-05 ENCOUNTER — Encounter: Payer: Self-pay | Admitting: Gynecologic Oncology

## 2016-10-05 ENCOUNTER — Ambulatory Visit: Payer: Medicare Other | Attending: Gynecologic Oncology | Admitting: Gynecologic Oncology

## 2016-10-05 VITALS — BP 136/61 | HR 86 | Resp 18 | Ht 62.0 in | Wt 115.7 lb

## 2016-10-05 DIAGNOSIS — Z9012 Acquired absence of left breast and nipple: Secondary | ICD-10-CM | POA: Diagnosis not present

## 2016-10-05 DIAGNOSIS — Z8249 Family history of ischemic heart disease and other diseases of the circulatory system: Secondary | ICD-10-CM | POA: Diagnosis not present

## 2016-10-05 DIAGNOSIS — D259 Leiomyoma of uterus, unspecified: Secondary | ICD-10-CM | POA: Insufficient documentation

## 2016-10-05 DIAGNOSIS — I340 Nonrheumatic mitral (valve) insufficiency: Secondary | ICD-10-CM | POA: Diagnosis not present

## 2016-10-05 DIAGNOSIS — R1909 Other intra-abdominal and pelvic swelling, mass and lump: Secondary | ICD-10-CM | POA: Diagnosis not present

## 2016-10-05 DIAGNOSIS — R971 Elevated cancer antigen 125 [CA 125]: Secondary | ICD-10-CM

## 2016-10-05 DIAGNOSIS — I429 Cardiomyopathy, unspecified: Secondary | ICD-10-CM | POA: Insufficient documentation

## 2016-10-05 DIAGNOSIS — N83202 Unspecified ovarian cyst, left side: Secondary | ICD-10-CM | POA: Insufficient documentation

## 2016-10-05 DIAGNOSIS — Z809 Family history of malignant neoplasm, unspecified: Secondary | ICD-10-CM | POA: Diagnosis not present

## 2016-10-05 DIAGNOSIS — I509 Heart failure, unspecified: Secondary | ICD-10-CM | POA: Diagnosis not present

## 2016-10-05 DIAGNOSIS — Z7982 Long term (current) use of aspirin: Secondary | ICD-10-CM | POA: Insufficient documentation

## 2016-10-05 DIAGNOSIS — Z853 Personal history of malignant neoplasm of breast: Secondary | ICD-10-CM | POA: Insufficient documentation

## 2016-10-05 DIAGNOSIS — N838 Other noninflammatory disorders of ovary, fallopian tube and broad ligament: Secondary | ICD-10-CM

## 2016-10-05 DIAGNOSIS — I110 Hypertensive heart disease with heart failure: Secondary | ICD-10-CM | POA: Insufficient documentation

## 2016-10-05 DIAGNOSIS — F17210 Nicotine dependence, cigarettes, uncomplicated: Secondary | ICD-10-CM | POA: Diagnosis not present

## 2016-10-05 DIAGNOSIS — Z880 Allergy status to penicillin: Secondary | ICD-10-CM | POA: Insufficient documentation

## 2016-10-05 NOTE — Patient Instructions (Addendum)
We will contact you with an appointment to follow up with Dr Melvyn Novas  Plan to follow up with Dr Everitt Amber on October 25, 2016 as discussed during you visit.  Thank you

## 2016-10-05 NOTE — Progress Notes (Signed)
Consult Note: Gyn-Onc  Valerie Downs 82 y.o. female  CC:  Chief Complaint  Patient presents with  . ovarian mass , left    follow up visit   Assessment/Plan:   82 year old para 10 with a complex, PET avid left adnexal mass a markedly elevated CA-125. Likely ovarian malignancy (primary or secondary).   She has significant medical cormorbidities making surgical resection (robotic assisted LSO) risky for her (most notably her CHF). However, I believe it would be technically feasible if she was prepared to take on the significant operative risk. However, the patient is not interested in chemotherapy at all.   If she has a diagnosis of ovarian cancer, surgery alone is not curative without chemotherapy. And given that the mass is asymptomatic, the surgery would not serve a palliative role (it would only serve a potentially morbid role). Therefore, given that she is not interested in chemotherapy, there is no reason to intervene surgically for this asymptomatic ovarian cancer.   She will return to see Dr Melvyn Novas at his request to follow-up considering options for lung mass biopsy vs palliative care/hospice.  I will schedule her to return to see me in approximately 3 weeks to synthesize her findings and records and determine her wishes regarding pursuing treatment vs end-of-life care planning.  HPI:  Patient was initially seen in consultation at the request of Dr. Hulan Fray for an elevated CA-125 and a complex lesion on the ovary.   This is all discover when the patient was admitted with chest pain and had a CT scan that found these changes incidentally.   Patient is a 82 year old with multiple medical problems including: Hypertension, iron just deficiency anemia, chronic congestive heart failure.  During that hospitalization she was noted to have a decreased ejection fraction of 44% however, the echo showed preserved ejection fraction. She was seen by Dr. Debara Pickett on the day of discharge. His impression  included: "Stress test showed mostly fixed inferior defect, suggestive of scar or artifact, however, echo did not show inferior scar with normal LV function. While this could be ischemia, it is less likely. I would favor artifact. We discussed cath as a definitive way to assess risk prior to possible surgery for her ovarian mass, however, her risk for surgery would be low to intermediate without cath, therefore, it would be reasonable to not have cath as well. She is not interested, and therefore, I will not push for it."  CT 07/06/16: The lungs are well aerated bilaterally. There is a rounded area soft tissue density measuring approximately 2 cm just posterior to the carina within the right lower lobe. This likely represents an area of round pneumonia although follow-up examination is recommended to assess for resolution. No other nodules are seen. No focal infiltrate or sizable effusion is seen.  The thoracic inlet is within normal limits. No significant hilar or mediastinal adenopathy is noted. The osseous structures show degenerative change of the thoracic spine. No acute bony abnormality is seen.  Vascular: The thoracic aorta and its branches are well visualized. Dilatation of the ascending aorta to 5 cm is noted. Mild coronary calcifications are seen. No findings of dissection are noted. The pulmonary artery demonstrates a normal branching pattern. No findings to suggest pulmonary embolus are noted. The cardiac structures are otherwise within normal limits.  CTA ABDOMEN AND PELVIS FINDINGS  Vascular: The aorta shows atherosclerotic calcifications although no aneurysmal dilatation is seen. No findings of dissection are noted. No focal areas of narrowing are seen.  The  celiac axis, superior mesenteric artery and inferior mesenteric artery are all widely patent. Single renal artery is identified on the right with dual renal arteries on the left.  Nonvascular:  The liver, spleen, adrenal  glands and pancreas are within normal limits. Kidneys demonstrates some cystic change. No obstructive changes are noted. No calculi are identified. Just beneath the pancreas there is a small cystic appearing area which appears separate from the pancreas. May be related to prior inflammatory change. It measures 2.3 cm and is best seen on image number 96 of series 5. The appendix is within normal limits. The bladder is well distended.  Multiple calcified uterine fibroids are seen. There is a predominately cystic appearing mass lesion arising in the expected area of the left ovary. It demonstrates some adjacent soft tissue component and measures approximately 7.8 by 3.7 cm in greatest dimension. Given the patient's age these changes are suspicious for underlying neoplasm. Further workup is recommended. No significant lymphadenopathy is identified. The changes previously described adjacent to the pancreas may be related to this lesion.  IMPRESSION: No evidence of aortic dissection or pulmonary embolism.  Complex appearing lesion in the expected region of the left ovary. Given the patient's age this is suspicious for underlying neoplasm and further workup is recommended.  Rounded area of soft tissue within the right lower lobe posterior to the carina as described. Followup imaging is recommended. These changes may also be related to the left ovary.  Cystic area adjacent to the pancreas which appears separate from the pancreas but may be related to the changes in the left ovary.IMPRESSION: 1. 7.7 x 2.9 x 5.6 cm complex left ovarian mass thick septations. A component of this mass may represent ovary. Ovarian malignancy cannot be excluded. Right ovary not visualized. Gynecologic evaluation suggested.  2. Endometrial thickening at 10 mm. Endometrial thickness is considered abnormal for an asymptomatic post-menopausal female. Endometrial sampling should be considered to exclude carcinoma.  Her CA-125 was  markedly elevated at 1074.  Interval Hx:  The patient was seen by Christinia Gully on 09/16/16 and was felt to not be a good candidate for biopsy of the lung mass which was most likely a primary lung carcinoma. PET CT on 09/08/16 had shown 2cm right lower lobe PET avid lesion, hypermetabolic right paratracheal and left prevascular nodes. The left ovarian mass was solid and hypermetabolic. No peritoneal metastases were seen. She comes accompanied by her daughter today.   She is able to ambulate without assistance and only occasionally gets SOB on exertion. She denies chest pain. She uses a wheelchair occasionally due to leg pain.  Review of Systems  Constitutional: She has lost approximately 26 pounds unintentionally since November. She states her appetite is starting to increase. Skin: No rash Cardiovascular: No chest pain, shortness of breath, or edema  Pulmonary: No cough  Gastro Intestinal: Reporting intermittent upper abdominal soreness.  No nausea, vomiting, constipation, or diarrhea reported. No bright red blood per rectum. Genitourinary: No frequency, urgency, or dysuria.  Denies vaginal bleeding and discharge.  Musculoskeletal: + left foot pain Neurologic: No weakness    Current Meds:  Outpatient Encounter Prescriptions as of 10/05/2016  Medication Sig  . aspirin EC 81 MG EC tablet Take 1 tablet (81 mg total) by mouth daily.  . ferrous sulfate 325 (65 FE) MG tablet Take 325 mg by mouth at bedtime.   Marland Kitchen losartan (COZAAR) 25 MG tablet Take 25 mg by mouth daily.  . metoprolol tartrate (LOPRESSOR) 25 MG tablet Take 1  tablet (25 mg total) by mouth 2 (two) times daily.  . Multiple Vitamins-Minerals (CENTRUM SILVER 50+WOMEN) TABS Take 1 tablet by mouth daily.   No facility-administered encounter medications on file as of 10/05/2016.     Allergy:  Allergies  Allergen Reactions  . Penicillins Anaphylaxis and Swelling    Has patient had a PCN reaction causing immediate rash,  facial/tongue/throat swelling, SOB or lightheadedness with hypotension: Yes Has patient had a PCN reaction causing severe rash involving mucus membranes or skin necrosis: Yes Has patient had a PCN reaction that required hospitalization Yes Has patient had a PCN reaction occurring within the last 10 years: No If all of the above answers are "NO", then may proceed with Cephalosporin use.     Social Hx:   Social History   Social History  . Marital status: Widowed    Spouse name: N/A  . Number of children: N/A  . Years of education: N/A   Occupational History  . Not on file.   Social History Main Topics  . Smoking status: Current Some Day Smoker    Packs/day: 0.25    Years: 50.00    Types: Cigarettes  . Smokeless tobacco: Current User    Types: Snuff  . Alcohol use No  . Drug use: No  . Sexual activity: No   Other Topics Concern  . Not on file   Social History Narrative  . No narrative on file    Past Surgical Hx:  Past Surgical History:  Procedure Laterality Date  . COLONOSCOPY N/A 06/22/2014   Procedure: COLONOSCOPY;  Surgeon: Jerene Bears, MD;  Location: [facility];  Service: Endoscopy;  Laterality: N/A;  . ESOPHAGOGASTRODUODENOSCOPY N/A 06/22/2014   Procedure: ESOPHAGOGASTRODUODENOSCOPY (EGD);  Surgeon: Jerene Bears, MD;  Location: [facility];  Service: Endoscopy;  Laterality: N/A;  . MASTECTOMY Left     Past Medical Hx:  Past Medical History:  Diagnosis Date  . Aortic insufficiency   . Breast cancer ([place])   . Cardiomyopathy ([place])   . CHF (congestive heart failure) ([place])   . Hypertension   . Mitral regurgitation     Oncology Hx:   No history exists.    Family Hx:  Family History  Problem Relation Age of Onset  . Hypertension Mother   . Cancer Father     Vitals:  Blood pressure 136/61, pulse 86, resp. rate 18, height 5\' 2"  (1.575 m), weight 115 lb 11.2 oz (52.5 kg), SpO2 98 %.  Physical Exam  Neck: Well-developed female in no acute distress.  Bilateral temporal wasting.  Neck: No lymphadenopathy.  Lungs: Clear to auscultation but he.  Cardiac Astro: Regular rate and rhythm.  Abdomen: Soft, nontender, nondistended. There are no palpable masses. There is no hepatomegaly. There is no fluid wave. There is no rebound or guarding.  Groins: No lymphadenopathy.  Extremities: No edema.  Pelvic: External genitalia is within normal limits. Vagina is markedly atrophic. The cervix is visualized. There's no visible lesions. There is no discharge. Bimanual examination reveals a slightly enlarged uterus for age. There is approximate 97 m mass that is firm hard but mobile in the left adnexa. Rectal confirms. There is no nodularity in the rectovaginal septum.  Donaciano Eva, MD 10/05/2016, 4:34 PM

## 2016-10-06 ENCOUNTER — Telehealth: Payer: Self-pay | Admitting: *Deleted

## 2016-10-06 NOTE — Telephone Encounter (Signed)
Notified patient of future appointment. Pt is scheduled to see Dr. Melvyn Novas on 10/12/16. Pt agreed with date and time of appointment

## 2016-10-06 NOTE — Telephone Encounter (Signed)
Ov with MW on 10/12/16

## 2016-10-12 ENCOUNTER — Encounter: Payer: Self-pay | Admitting: Internal Medicine

## 2016-10-12 ENCOUNTER — Telehealth: Payer: Self-pay | Admitting: Cardiovascular Disease

## 2016-10-12 ENCOUNTER — Ambulatory Visit (INDEPENDENT_AMBULATORY_CARE_PROVIDER_SITE_OTHER): Payer: Medicare Other | Admitting: Internal Medicine

## 2016-10-12 VITALS — BP 116/68 | HR 67 | Ht 62.0 in | Wt 117.8 lb

## 2016-10-12 DIAGNOSIS — R911 Solitary pulmonary nodule: Secondary | ICD-10-CM | POA: Diagnosis not present

## 2016-10-12 DIAGNOSIS — R0600 Dyspnea, unspecified: Secondary | ICD-10-CM

## 2016-10-12 NOTE — Assessment & Plan Note (Addendum)
Echo 07/07/16 Left ventricle: The cavity size was normal. There was severe   focal basal and moderate concentric hypertrophy of the left   ventricle . Systolic function was normal. The estimated ejection   fraction was in the range of 60% to 65%. Wall motion was normal;   there were no regional wall motion abnormalities. Doppler   parameters are consistent with abnormal left ventricular   relaxation (grade 1 diastolic dysfunction). Doppler parameters   are consistent with elevated ventricular end-diastolic filling   pressure. - Aortic valve: There was moderate regurgitation. - Aortic root: The aortic root was mildly dilated measuring 40 mm. - Mitral valve: Calcified annulus. Mildly thickened leaflets .   There was moderate regurgitation. - Left atrium: The atrium was severely dilated. - Tricuspid valve: There was mild regurgitation. - Pulmonic valve: There was mild regurgitation. - Pulmonary arteries: Systolic pressure was within the normal   range.    - Spirometry 10/12/2016  FEV1 1.38 (108%)  Ratio 66 with minimal curvature off all resp rx   - 10/12/2016   Walked RA  2 laps @ 185 ft each stopped due to  Ankle hurting, no cp or sob or desat      Improved s specific rx with only GOLD I criteria.  As I explained to this patient in detail:  although there may be copd present, it may not be clinically relevant:   it does not appear to be limiting activity tolerance any more than a set of worn tires limits someone from driving a car  around a parking lot.  A new set of Michelins might look good but would have no perceived impact on the performance of the car and would not be worth the cost.  That is to say:   this pt is so sedentary I don't recommend aggressive pulmonary rx at this point unless limiting symptoms arise or acute exacerbations become as issue, neither of which is the case now.  I asked the patient to contact this office at any time in the future should either of these  problems arise.

## 2016-10-12 NOTE — Telephone Encounter (Signed)
New message      Pt needs to be cleared to have a biopsy on her lung by Dr Melvyn Novas.  She want to see Dr Angelena Form only----no pa/np. Is there anywhere on Dr Camillia Herter schedule this pt can be seen

## 2016-10-12 NOTE — Telephone Encounter (Signed)
Spoke with Valerie Downs and appt made for her to see Dr. Angelena Form on October 23,2017 at 2:15.

## 2016-10-12 NOTE — Patient Instructions (Addendum)
See Dr Angelena Form or Truitt Merle to see if they can clear you for general anesthesia for an EBUS bx.    Set up appt with Dr Lake Bells for second opinion on feasibility of EBUS biopsy of the lung lymph nodes.

## 2016-10-12 NOTE — Progress Notes (Signed)
Subjective:  Patient ID: Valerie Downs, female    DOB: 1930/05/29,    MRN: [id number]     Brief patient profile:  82 yobf active smoker with variable sob more often while  lying down than  Walking but very limited since   around 2014 admitted to [facility]  07/26/13 and referred to pulmonary clinic 09/16/2016 by NP for SPN in setting of ovarian mass      Admit date: 07/06/2016 Discharge date: 07/08/2016   Discharge Condition:  stable   CODE STATUS:  Full code   Diet recommendation:   Heart healthy Consultations:  cardiology    Discharge Diagnoses:  Principal Problem:   Chest pain Active Problems:   HYPERTENSION, BENIGN SYSTEMIC   Iron deficiency anemia   Chronic systolic CHF (congestive heart failure) (HCC)   Ovarian mass, left   Brief Summary: 82 y/o with HTN and nicotine abuse presents for chest pain described as heaviness in her left chest.    Hospital Course:  Principal Problem:  Chest pain - myoview shows a scar and EF of 40 %- cardiology recommended a cath- patient has declined this - will discharge on a baby aspirin daily- already on Metoprolol - ECHO does not show a scar and shows a normal EF- see report below - CT negative for PE    Active Problems:  Ovarian mass, left - noted incidentally on CTA - has possible lesions in RLL and adjacent to pancrease as well - have discussed with patient and daughters- she will take CD of CT scan with her to Gyn for further work up   Mild renal insufficiency - see labs below - Losartan on hold during hospital stay- she refused a blood draw today   HYPERTENSION, BENIGN SYSTEMIC - Metoprolol - Losartan on hold   Iron deficiency anemia - ferrous sulfate   Chronic systolic CHF (congestive heart failure)- NICM - compensated- see ECHO below - on Losartan (held) and Metoprolol   09/16/2016 1st [place] [facility] office visit/ [facility]   Chief Complaint  Patient presents with  . Pulmonary Consult   Referred by Dr. Joylene John for eval of pulmonary nodule. Pt c/o DOE x 2 months. She gets winded just walking from room to room at home.   episodes at rest  [facility] = can't walk 100 yards even at a slow pace at a flat grade s stopping due to sob  eg shopping  rec Stop smoking      10/12/2016  f/u ov/[facility] re: GOLD I COPD/ ? Lung ca vs met ovarian/ still smoking  Chief Complaint  Patient presents with  . Follow-up    Breathing is unchanged. No new co's today.   breathing is better / cut down on smoking / able to walk up to 20 min   No obvious day to day or daytime variability or assoc excess/ purulent sputum or mucus plugs or hemoptysis or cp or chest tightness, subjective wheeze or overt sinus or hb symptoms. No unusual exp hx or h/o childhood pna/ asthma or knowledge of premature birth.  Sleeping ok without nocturnal  or early am exacerbation  of respiratory  c/o's or need for noct saba. Also denies any obvious fluctuation of symptoms with weather or environmental changes or other aggravating or alleviating factors except as outlined above   Current Medications, Allergies, Complete Past Medical History, Past Surgical History, Family History, and Social History were reviewed in [employer] record.  ROS  The following are not active complaints  unless bolded sore throat, dysphagia, dental problems, itching, sneezing,  nasal congestion or excess/ purulent secretions, ear ache,   fever, chills, sweats, unintended wt loss, classically pleuritic or exertional cp,  orthopnea pnd or leg swelling, presyncope, palpitations, abdominal pain, anorexia, nausea, vomiting, diarrhea  or change in bowel or bladder habits, change in stools or urine, dysuria,hematuria,  rash, arthralgias, visual complaints, headache, numbness, weakness or ataxia or problems with walking or coordination,  change in mood/affect or memory.         Objective:   Physical Exam  amb somber bf nad  10/12/2016       118   09/16/16 113 lb (51.3 kg)  08/25/16 114 lb (51.7 kg)  08/04/16 111 lb (50.3 kg)    Vital signs reviewed - sats 99% on RA on arrival   HEENT: nl dentition, turbinates, and oropharynx. Nl external ear canals without cough reflex   NECK :  without JVD/Nodes/TM/ nl carotid upstrokes bilaterally   LUNGS: no acc muscle use,  Nl contour chest which is clear to A and P bilaterally without cough on insp or exp maneuvers   CV:  RRR  no s3 or murmur or increase in P2, no edema   ABD:  soft and nontender with nl inspiratory excursion in the supine position. No bruits or organomegaly, bowel sounds nl  MS:  Nl gait/ ext warm without deformities, calf tenderness, cyanosis or clubbing No obvious joint restrictions   SKIN: warm and dry without lesions    NEURO:  alert, approp, nl sensorium with  no motor deficits      CXR PA and Lateral:   09/16/2016 :    I personally reviewed images and agree with radiology impression as follows:   Nodule posterior to the right hilum by recent PET-CT has been occult by plain film and is again not seen. No evidence of acute disease.   PET 09/08/16  The 2 cm right lower lobe pulmonary lesion in the azygoesophageal recess is hypermetabolic with SUV max of 42.9. This is consistent with neoplasm. There are also hypermetabolic right paratracheal and left prevascular nodes. The right-sided paratracheal node measures 13 mm and SUV max is 5.7. The aorticopulmonary window node measures 10.5 mm and SUV max is 5.3.       Assessment & Plan:

## 2016-10-12 NOTE — Assessment & Plan Note (Addendum)
PET 09/08/16  The 2 cm right lower lobe pulmonary lesion in the azygoesophageal recess is hypermetabolic with SUV max of 17.6. This is consistent with neoplasm. There are also hypermetabolic right paratracheal and left prevascular nodes. The right-sided paratracheal node measures 13 mm and SUV max is 5.7. The aorticopulmonary window node measures 10.5 mm and SUV max is 5.3.Much better than last ov and could undergo EBUS but note record states cards wanted to do LHC and she declined (can't find cards note that says that, though)  Discussed with GYN > if it turns out this adenopathy is due to met ovarian, which is doubtful, they would only offer chemo.  It's also possible this is primary lung ca unrelated to ovarian and an argument could certainly be made to leave well enough alone if not going to tackle the larger ovarian mass and see which problem (lung or ovarian) cause symptoms first.  I have offered a second opinion by Dr McQuaid re this issue and she and fm agreed   Total time devoted to counseling  = 25/40m review case with pt/ discussion of options/alternatives/ personally creating written instructions  in presence of pt  then going over those specific  Instructions directly with the pt including how to use all of the meds but in particular covering each new medication in detail and the difference between the maintenance/automatic meds and the prns using an action plan format for the latter.   

## 2016-10-14 ENCOUNTER — Telehealth: Payer: Self-pay | Admitting: Internal Medicine

## 2016-10-14 NOTE — Telephone Encounter (Signed)
Pt states she was unsure if she was supposed to call us back with cardiology appt. Pt states she has an appt with cardiology on 10-18-16 at 2:15am. Advised pt to call us with any further questions or concerns. Nothing further needed.   Will route to MW for a fyi.

## 2016-10-18 ENCOUNTER — Encounter: Payer: Self-pay | Admitting: Cardiovascular Disease

## 2016-10-18 ENCOUNTER — Ambulatory Visit (INDEPENDENT_AMBULATORY_CARE_PROVIDER_SITE_OTHER): Payer: Medicare Other | Admitting: Cardiovascular Disease

## 2016-10-18 VITALS — BP 136/62 | HR 85 | Ht 62.0 in | Wt 120.0 lb

## 2016-10-18 DIAGNOSIS — I351 Nonrheumatic aortic (valve) insufficiency: Secondary | ICD-10-CM

## 2016-10-18 DIAGNOSIS — I5022 Chronic systolic (congestive) heart failure: Secondary | ICD-10-CM

## 2016-10-18 DIAGNOSIS — I10 Essential (primary) hypertension: Secondary | ICD-10-CM

## 2016-10-18 DIAGNOSIS — Z01810 Encounter for preprocedural cardiovascular examination: Secondary | ICD-10-CM

## 2016-10-18 DIAGNOSIS — I340 Nonrheumatic mitral (valve) insufficiency: Secondary | ICD-10-CM | POA: Diagnosis not present

## 2016-10-18 NOTE — Progress Notes (Signed)
Chief Complaint  Patient presents with  . Shortness of Breath   History of Present Illness: 82 yo female with history with history of breast cancer, systolic LV dysfunction due to presumed non-ischemic cardiomyopathy who is here today for cardiac follow up. She was admitted to [facility] 07/26/13 with c/o SOB. She was found to have volume overload with BNP of 13,000. Chest x-ray with mild vascular congestion. Echo 07/27/13 with severe LVH, LVEF 40-45%, moderate AI, moderate MR, moderate TR. She was diuresed and discharged home 07/29/13. Echo 08/07/14 with LVEF=55-60%, mild to moderate AI and mild MR. She was seen in our office June 2017 by Truitt Merle, NP with dyspnea. She was not felt to be volume overloaded, actually with 24 lbs weight loss.  Admitted to [facility] July 2017 with chest pain and very minimal troponin elevation. Nuclear stress test with possible scar, no ischemia, LVEF=40%. Pt declined a cardiac cath. Echo July 2017 with normal LV function, normal wall motion, moderate AI, Moderate MR. CT scan with ovarian mass. She was referred to Oncology. This is felt to represent ovarian cancer but she is not interested in pursuing workup including surgery or chemotherapy. She also has a lung mass and is seeing Dr. Melvyn Novas. Question if she can undergo EBUS biopsy of lung mass. It is unclear if she will allow anyone to treat lung cancer if diagnosed.    She is here today for follow up. She tells me today that she feels great. No chest pain or SOB. No LE edema. Tolerating all meds. Weight is stable. She has smoked for 70 years.   Primary Care Physician:  No PCP Per Patient  Past Medical History:  Diagnosis Date  . Aortic insufficiency   . Breast cancer ([place])   . Cardiomyopathy ([place])   . CHF (congestive heart failure) ([place])   . Hypertension   . Mitral regurgitation     Past Surgical History:  Procedure Laterality Date  . COLONOSCOPY N/A 06/22/2014   Procedure: COLONOSCOPY;  Surgeon: Jerene Bears, MD;  Location: [facility]COPY;  Service: Endoscopy;  Laterality: N/A;  . ESOPHAGOGASTRODUODENOSCOPY N/A 06/22/2014   Procedure: ESOPHAGOGASTRODUODENOSCOPY (EGD);  Surgeon: Jerene Bears, MD;  Location: [facility];  Service: Endoscopy;  Laterality: N/A;  . MASTECTOMY Left     Current Outpatient Prescriptions  Medication Sig Dispense Refill  . aspirin EC 81 MG EC tablet Take 1 tablet (81 mg total) by mouth daily.    . IRON PO Take 1 tablet by mouth daily.    Marland Kitchen losartan (COZAAR) 25 MG tablet Take 25 mg by mouth daily.    . metoprolol tartrate (LOPRESSOR) 25 MG tablet Take 1 tablet (25 mg total) by mouth 2 (two) times daily. 180 tablet 3  . Multiple Vitamins-Minerals (CENTRUM SILVER 50+WOMEN) TABS Take 1 tablet by mouth daily.     No current facility-administered medications for this visit.     Allergies  Allergen Reactions  . Penicillins Anaphylaxis and Swelling    Has patient had a PCN reaction causing immediate rash, facial/tongue/throat swelling, SOB or lightheadedness with hypotension: Yes Has patient had a PCN reaction causing severe rash involving mucus membranes or skin necrosis: Yes Has patient had a PCN reaction that required hospitalization Yes Has patient had a PCN reaction occurring within the last 10 years: No If all of the above answers are "NO", then may proceed with Cephalosporin use.     Social History   Social History  . Marital status: Widowed  Spouse name: N/A  . Number of children: N/A  . Years of education: N/A   Occupational History  . Not on file.   Social History Main Topics  . Smoking status: Current Some Day Smoker    Packs/day: 0.25    Years: 50.00    Types: Cigarettes  . Smokeless tobacco: Former Systems developer    Types: Snuff     Comment: per patient has not used snuff in a while (10/18/16)  . Alcohol use No  . Drug use: No  . Sexual activity: No   Other Topics Concern  . Not on file   Social History Narrative  . No narrative on file     Family History  Problem Relation Age of Onset  . Hypertension Mother   . Cancer Father     Review of Systems:  As stated in the HPI and otherwise negative.   BP 136/62   Pulse 85   Ht 5\' 2"  (1.575 m)   Wt 120 lb (54.4 kg)   BMI 21.95 kg/m   Physical Examination: General: Well developed, well nourished, NAD  HEENT: OP clear, mucus membranes moist  SKIN: warm, dry. No rashes. Neuro: No focal deficits  Musculoskeletal: Muscle strength 5/5 all ext  Psychiatric: Mood and affect normal  Neck: No JVD, no carotid bruits, no thyromegaly, no lymphadenopathy.  Lungs:Clear bilaterally, no wheezes, rhonci, crackles Cardiovascular: Regular rate and rhythm. Systolic murmur. No gallops or rubs. Abdomen:Soft. Bowel sounds present. Non-tender.  Extremities: No lower extremity edema. Pulses are 2 + in the bilateral DP/PT.  Nuclear stress test 07/07/16: Perfusion: There is a moderate area of mildly decreased activity involving the inferior wall of the left ventricular myocardium on both the rest and stress deep studies. No suggestion of reversible ischemia.  Wall Motion: Global hypokinesis particularly in the set him. No left ventricular dilation.  Left Ventricular Ejection Fraction: 44 %  End diastolic volume 123456 ml  End systolic volume 67 ml  IMPRESSION: 1. No reversible ischemia.  Inferior wall attenuation or scar.  2. Global hypokinesis most marked in the septum.  3. Left ventricular ejection fraction 44%  4. Intermediate-risk stress test findings*.  Echo 07/07/16: Left ventricle: The cavity size was normal. There was severe   focal basal and moderate concentric hypertrophy of the left   ventricle . Systolic function was normal. The estimated ejection   fraction was in the range of 60% to 65%. Wall motion was normal;   there were no regional wall motion abnormalities. Doppler   parameters are consistent with abnormal left ventricular   relaxation (grade 1  diastolic dysfunction). Doppler parameters   are consistent with elevated ventricular end-diastolic filling   pressure. - Aortic valve: There was moderate regurgitation. - Aortic root: The aortic root was mildly dilated measuring 40 mm. - Mitral valve: Calcified annulus. Mildly thickened leaflets .   There was moderate regurgitation. - Left atrium: The atrium was severely dilated. - Tricuspid valve: There was mild regurgitation. - Pulmonic valve: There was mild regurgitation. - Pulmonary arteries: Systolic pressure was within the normal   range.  EKG:  EKG is ordered today. The ekg ordered today demonstrates NSR, LVH, Poor R wave progression precordial leads. Lateral Q-waves.   Recent Labs: 07/06/2016: B Natriuretic Peptide 1,416.9; TSH 0.038 07/07/2016: ALT 10; BUN 26; Creatinine, Ser 1.05; Hemoglobin 8.8; Platelets 216; Potassium 4.2; Sodium 137   Lipid Panel    Component Value Date/Time   CHOL 101 07/06/2016 1954   TRIG 66 07/06/2016  1954   HDL 39 (L) 07/06/2016 1954   CHOLHDL 2.6 07/06/2016 1954   VLDL 13 07/06/2016 1954   LDLCALC 49 07/06/2016 1954     Wt Readings from Last 3 Encounters:  10/18/16 120 lb (54.4 kg)  10/12/16 117 lb 12.8 oz (53.4 kg)  10/05/16 115 lb 11.2 oz (52.5 kg)     Other studies Reviewed: Additional studies/ records that were reviewed today include: . Review of the above records demonstrates:   Assessment and Plan:   1. Pre-operative cardiovascular examination: She is not known to have CAD but given risk factors including age, HTN and tobacco abuse for 70 years, probability of CAD is high. She has had no prior cardiac catheterization. Nuclear stress test July 2017 with no reversible ischemia. Possible scar with reduced LVEF but follow up echo contradicted this. LVEF was normal with no wall motion abnormalities. She is having no chest pain. She is not sure she would like invasive testing. At this time, I would suggest that she proceed with EBUS of  the lung mass without further cardiac testing. If she is not willing to treat her ovarian cancer, I am not sure she would be willing to treat lung cancer.   2. Mitral regurgitation: Moderate by echo July 2017.    3. Aortic insufficiency: Moderate by echo July 2017   4. HTN: BP controlled. No changes today.   5. Probable ovarian cancer/possible lung cancer: Pt is not sure she wishes to be aggressive to in approach to diagnosis and management. If she is not willing to consider treatment for cancer, I would not offer invasive cardiac testing.   6. Chronic systolic CHF: Volume status is ok today. No longer using Lasix prn.   Current medicines are reviewed at length with the patient today.  The patient does not have concerns regarding medicines.  The following changes have been made:  no change  Labs/ tests ordered today include:  No orders of the defined types were placed in this encounter.    Disposition:   FU with me in 6 months   Signed, Lauree Chandler, MD 10/18/2016 2:42 PM    [facility] [address], [place], [place]  [address] Phone: [phone]; Fax: [phone]

## 2016-10-18 NOTE — Patient Instructions (Signed)

## 2016-10-25 ENCOUNTER — Ambulatory Visit: Payer: Medicare Other | Attending: Gynecologic Oncology | Admitting: Gynecologic Oncology

## 2016-10-25 ENCOUNTER — Encounter: Payer: Self-pay | Admitting: Gynecologic Oncology

## 2016-10-25 VITALS — BP 153/71 | HR 90 | Temp 97.7°F | Resp 19 | Wt 122.5 lb

## 2016-10-25 DIAGNOSIS — I429 Cardiomyopathy, unspecified: Secondary | ICD-10-CM | POA: Diagnosis not present

## 2016-10-25 DIAGNOSIS — I110 Hypertensive heart disease with heart failure: Secondary | ICD-10-CM | POA: Diagnosis not present

## 2016-10-25 DIAGNOSIS — I509 Heart failure, unspecified: Secondary | ICD-10-CM | POA: Diagnosis not present

## 2016-10-25 DIAGNOSIS — Z853 Personal history of malignant neoplasm of breast: Secondary | ICD-10-CM | POA: Diagnosis not present

## 2016-10-25 DIAGNOSIS — N838 Other noninflammatory disorders of ovary, fallopian tube and broad ligament: Secondary | ICD-10-CM

## 2016-10-25 DIAGNOSIS — Z809 Family history of malignant neoplasm, unspecified: Secondary | ICD-10-CM | POA: Insufficient documentation

## 2016-10-25 DIAGNOSIS — F17210 Nicotine dependence, cigarettes, uncomplicated: Secondary | ICD-10-CM | POA: Insufficient documentation

## 2016-10-25 DIAGNOSIS — Z7982 Long term (current) use of aspirin: Secondary | ICD-10-CM | POA: Insufficient documentation

## 2016-10-25 DIAGNOSIS — I340 Nonrheumatic mitral (valve) insufficiency: Secondary | ICD-10-CM | POA: Diagnosis not present

## 2016-10-25 DIAGNOSIS — Z8249 Family history of ischemic heart disease and other diseases of the circulatory system: Secondary | ICD-10-CM | POA: Diagnosis not present

## 2016-10-25 DIAGNOSIS — Z9012 Acquired absence of left breast and nipple: Secondary | ICD-10-CM | POA: Insufficient documentation

## 2016-10-25 DIAGNOSIS — N83202 Unspecified ovarian cyst, left side: Secondary | ICD-10-CM | POA: Insufficient documentation

## 2016-10-25 DIAGNOSIS — R971 Elevated cancer antigen 125 [CA 125]: Secondary | ICD-10-CM | POA: Diagnosis not present

## 2016-10-25 DIAGNOSIS — N839 Noninflammatory disorder of ovary, fallopian tube and broad ligament, unspecified: Secondary | ICD-10-CM

## 2016-10-25 DIAGNOSIS — Z880 Allergy status to penicillin: Secondary | ICD-10-CM | POA: Diagnosis not present

## 2016-10-25 NOTE — Progress Notes (Signed)
Consult Note: Gyn-Onc  [name] 82 y.o. female  CC:  Chief Complaint  Patient presents with  . Ovarian mass , left    Follow up   Assessment/Plan:   82 year old para 10 with a complex, PET avid left adnexal mass a markedly elevated CA-125. Likely ovarian malignancy (primary or secondary).   She has significant medical cormorbidities making surgical resection (robotic assisted LSO) risky for her (most notably her CHF). I believe it would be technically feasible if she was prepared to take on the significant operative risk. However, the patient is not interested in chemotherapy at all, and therefore it is not reasonable to expose her to major surgical risk and recovery for a purely diagnostic procedure. If she has a diagnosis of ovarian cancer, surgery alone is not curative without chemotherapy. And given that the mass is asymptomatic, the surgery would not serve a palliative role (it would only serve a potentially morbid role). Therefore, given that she is not interested in chemotherapy, there is no reason to intervene surgically for this asymptomatic ovarian cancer.   She is awaiting 2nd opinion from Dr Lake Bells regarding the potential biopsy of her pulmonary lesion as recommended by Dr Melvyn Novas vs palliative care/hospice.  She no longer has a PCP and this is an important referral to make given that the patient likely has malignancy (dual primaries vs metastatic disease) and therefore will require a primary care physician to assist in endo of life care and hospice planning (given that the patient is not interested in therapy).  HPI:  Patient was initially seen in consultation at the request of Dr. Hulan Fray for an elevated CA-125 and a complex lesion on the ovary.   This is all discover when the patient was admitted with chest pain and had a CT scan that found these changes incidentally.   Patient is a 82 year old with multiple medical problems including: Hypertension, iron just deficiency  anemia, chronic congestive heart failure.  During that hospitalization she was noted to have a decreased ejection fraction of 44% however, the echo showed preserved ejection fraction. She was seen by Dr. Debara Pickett on the day of discharge. His impression included: "Stress test showed mostly fixed inferior defect, suggestive of scar or artifact, however, echo did not show inferior scar with normal LV function. While this could be ischemia, it is less likely. I would favor artifact. We discussed cath as a definitive way to assess risk prior to possible surgery for her ovarian mass, however, her risk for surgery would be low to intermediate without cath, therefore, it would be reasonable to not have cath as well. She is not interested, and therefore, I will not push for it."  CT 07/06/16: The lungs are well aerated bilaterally. There is a rounded area soft tissue density measuring approximately 2 cm just posterior to the carina within the right lower lobe. This likely represents an area of round pneumonia although follow-up examination is recommended to assess for resolution. No other nodules are seen. No focal infiltrate or sizable effusion is seen.  The thoracic inlet is within normal limits. No significant hilar or mediastinal adenopathy is noted. The osseous structures show degenerative change of the thoracic spine. No acute bony abnormality is seen.  Vascular: The thoracic aorta and its branches are well visualized. Dilatation of the ascending aorta to 5 cm is noted. Mild coronary calcifications are seen. No findings of dissection are noted. The pulmonary artery demonstrates a normal branching pattern. No findings to suggest pulmonary embolus are noted.  The cardiac structures are otherwise within normal limits.  CTA ABDOMEN AND PELVIS FINDINGS  Vascular: The aorta shows atherosclerotic calcifications although no aneurysmal dilatation is seen. No findings of dissection are noted. No focal areas of  narrowing are seen.  The celiac axis, superior mesenteric artery and inferior mesenteric artery are all widely patent. Single renal artery is identified on the right with dual renal arteries on the left.  Nonvascular:  The liver, spleen, adrenal glands and pancreas are within normal limits. Kidneys demonstrates some cystic change. No obstructive changes are noted. No calculi are identified. Just beneath the pancreas there is a small cystic appearing area which appears separate from the pancreas. May be related to prior inflammatory change. It measures 2.3 cm and is best seen on image number 96 of series 5. The appendix is within normal limits. The bladder is well distended.  Multiple calcified uterine fibroids are seen. There is a predominately cystic appearing mass lesion arising in the expected area of the left ovary. It demonstrates some adjacent soft tissue component and measures approximately 7.8 by 3.7 cm in greatest dimension. Given the patient's age these changes are suspicious for underlying neoplasm. Further workup is recommended. No significant lymphadenopathy is identified. The changes previously described adjacent to the pancreas may be related to this lesion.  IMPRESSION: No evidence of aortic dissection or pulmonary embolism.  Complex appearing lesion in the expected region of the left ovary. Given the patient's age this is suspicious for underlying neoplasm and further workup is recommended.  Rounded area of soft tissue within the right lower lobe posterior to the carina as described. Followup imaging is recommended. These changes may also be related to the left ovary.  Cystic area adjacent to the pancreas which appears separate from the pancreas but may be related to the changes in the left ovary.IMPRESSION: 1. 7.7 x 2.9 x 5.6 cm complex left ovarian mass thick septations. A component of this mass may represent ovary. Ovarian malignancy cannot be excluded. Right ovary not  visualized. Gynecologic evaluation suggested.  2. Endometrial thickening at 10 mm. Endometrial thickness is considered abnormal for an asymptomatic post-menopausal female. Endometrial sampling should be considered to exclude carcinoma.  Her CA-125 was markedly elevated at 1074.  The patient was seen by Christinia Gully on 09/16/16 and was felt to not be a good candidate for biopsy of the lung mass which was most likely a primary lung carcinoma. PET CT on 09/08/16 had shown 2cm right lower lobe PET avid lesion, hypermetabolic right paratracheal and left prevascular nodes. The left ovarian mass was solid and hypermetabolic. No peritoneal metastases were seen. She comes accompanied by her daughter today.   She is able to ambulate without assistance and only occasionally gets SOB on exertion. She denies chest pain. She uses a wheelchair occasionally due to leg pain.  Interval Hx:  The patient was seen by Cardiologist, Dr Lauree Chandler on 10/18/16 who felt that she had an acceptable cardiac profile for surgical intervention (including pulmonary biopsy).  The patient's pulmonologist, Dr Melvyn Novas, recommended second opinion with Dr Lake Bells.  Review of Systems  Constitutional: She has lost approximately 26 pounds unintentionally since November. She states her appetite is starting to increase. Skin: No rash Cardiovascular: No chest pain, shortness of breath, or edema  Pulmonary: No cough  Gastro Intestinal: Reporting intermittent upper abdominal soreness.  No nausea, vomiting, constipation, or diarrhea reported. No bright red blood per rectum. Genitourinary: No frequency, urgency, or dysuria.  Denies vaginal bleeding and discharge.  Musculoskeletal: + left foot pain Neurologic: No weakness    Current Meds:  Outpatient Encounter Prescriptions as of 10/25/2016  Medication Sig  . aspirin EC 81 MG EC tablet Take 1 tablet (81 mg total) by mouth daily.  . IRON PO Take 1 tablet by mouth daily.  Marland Kitchen  losartan (COZAAR) 25 MG tablet Take 25 mg by mouth daily.  . metoprolol tartrate (LOPRESSOR) 25 MG tablet Take 1 tablet (25 mg total) by mouth 2 (two) times daily.  . Multiple Vitamins-Minerals (CENTRUM SILVER 50+WOMEN) TABS Take 1 tablet by mouth daily.   No facility-administered encounter medications on file as of 10/25/2016.     Allergy:  Allergies  Allergen Reactions  . Penicillins Anaphylaxis and Swelling    Has patient had a PCN reaction causing immediate rash, facial/tongue/throat swelling, SOB or lightheadedness with hypotension: Yes Has patient had a PCN reaction causing severe rash involving mucus membranes or skin necrosis: Yes Has patient had a PCN reaction that required hospitalization Yes Has patient had a PCN reaction occurring within the last 10 years: No If all of the above answers are "NO", then may proceed with Cephalosporin use.     Social Hx:   Social History   Social History  . Marital status: Widowed    Spouse name: N/A  . Number of children: N/A  . Years of education: N/A   Occupational History  . Not on file.   Social History Main Topics  . Smoking status: Current Some Day Smoker    Packs/day: 0.25    Years: 50.00    Types: Cigarettes  . Smokeless tobacco: Former Systems developer    Types: Snuff     Comment: per patient has not used snuff in a while (10/18/16)  . Alcohol use No  . Drug use: No  . Sexual activity: No   Other Topics Concern  . Not on file   Social History Narrative  . No narrative on file    Past Surgical Hx:  Past Surgical History:  Procedure Laterality Date  . COLONOSCOPY N/A 06/22/2014   Procedure: COLONOSCOPY;  Surgeon: Jerene Bears, MD;  Location: [facility];  Service: Endoscopy;  Laterality: N/A;  . ESOPHAGOGASTRODUODENOSCOPY N/A 06/22/2014   Procedure: ESOPHAGOGASTRODUODENOSCOPY (EGD);  Surgeon: Jerene Bears, MD;  Location: [facility];  Service: Endoscopy;  Laterality: N/A;  . MASTECTOMY Left     Past Medical Hx:  Past  Medical History:  Diagnosis Date  . Aortic insufficiency   . Breast cancer ([place])   . Cardiomyopathy ([place])   . CHF (congestive heart failure) ([place])   . Hypertension   . Mitral regurgitation     Oncology Hx:   No history exists.    Family Hx:  Family History  Problem Relation Age of Onset  . Hypertension Mother   . Cancer Father     Vitals:  Blood pressure (!) 153/71, pulse 90, temperature 97.7 F (36.5 C), temperature source Oral, resp. rate 19, weight 122 lb 8 oz (55.6 kg), SpO2 97 %.  Physical Exam  Neck: Well-developed female in no acute distress. Bilateral temporal wasting.  Neck: No lymphadenopathy.  Lungs: Clear to auscultation but he.  Cardiac Astro: Regular rate and rhythm.  Abdomen: Soft, nontender, nondistended. There are no palpable masses. There is no hepatomegaly. There is no fluid wave. There is no rebound or guarding.  Groins: No lymphadenopathy.  Extremities: No edema.  Pelvic: deferred  Donaciano Eva, MD 10/25/2016, 2:43 PM

## 2016-10-25 NOTE — Patient Instructions (Signed)
We will look into setting you up with a primary care provider.  We will also follow up about the second opinion with Dr. Lake Bells.  Please call for any questions or concerns.

## 2016-10-28 ENCOUNTER — Telehealth: Payer: Self-pay

## 2016-10-28 NOTE — Telephone Encounter (Signed)
-----   Message from Juanito Doom, MD sent at 10/26/2016  1:49 PM EDT ----- Do you know anything about this? ----- Message ----- From: Everitt Amber, MD Sent: 10/25/2016   2:57 PM To: Juanito Doom, MD

## 2016-10-28 NOTE — Telephone Encounter (Signed)
Per BQ, pt needs to be seen by him only next available (15 minute slot ok) for second opinion per MW. atc pt, no answer, vm full.  Wcb.

## 2016-11-02 ENCOUNTER — Telehealth: Payer: Self-pay | Admitting: Gynecologic Oncology

## 2016-11-02 ENCOUNTER — Telehealth: Payer: Self-pay

## 2016-11-02 NOTE — Telephone Encounter (Signed)
Orders received to contact Maryanna Shape to establish care for the patient . [id number] , spoke with Noitamyae . The patient was scheduled with Terri Piedra for Thursday November 0, 2017 at 1 PM . The patient was contacted and updated , patient wrote the information and agreed to the plan . Patient denies further questions at this time .

## 2016-11-02 NOTE — Telephone Encounter (Signed)
Called to check on the status of the second opinion with Dr. Lake Bells per Dr. Gustavus Bryant note from 10/12/16.  Office staff stating they would give her a call to have her come in and meet with Dr. Lake Bells.

## 2016-11-04 ENCOUNTER — Ambulatory Visit (INDEPENDENT_AMBULATORY_CARE_PROVIDER_SITE_OTHER): Payer: Medicare Other | Admitting: Family

## 2016-11-04 ENCOUNTER — Other Ambulatory Visit (INDEPENDENT_AMBULATORY_CARE_PROVIDER_SITE_OTHER): Payer: Medicare Other

## 2016-11-04 ENCOUNTER — Encounter: Payer: Self-pay | Admitting: Family

## 2016-11-04 VITALS — BP 144/74 | HR 79 | Temp 98.4°F | Resp 16 | Ht 62.0 in | Wt 122.8 lb

## 2016-11-04 DIAGNOSIS — D509 Iron deficiency anemia, unspecified: Secondary | ICD-10-CM | POA: Diagnosis not present

## 2016-11-04 DIAGNOSIS — Z0000 Encounter for general adult medical examination without abnormal findings: Secondary | ICD-10-CM

## 2016-11-04 DIAGNOSIS — F17210 Nicotine dependence, cigarettes, uncomplicated: Secondary | ICD-10-CM

## 2016-11-04 DIAGNOSIS — I5021 Acute systolic (congestive) heart failure: Secondary | ICD-10-CM | POA: Diagnosis not present

## 2016-11-04 DIAGNOSIS — I10 Essential (primary) hypertension: Secondary | ICD-10-CM

## 2016-11-04 LAB — CBC
MCHC: 31.8 g/dL (ref 30.0–36.0)
MCV: 85.5 fl (ref 78.0–100.0)
PLATELETS: 265 10*3/uL (ref 150.0–400.0)
RBC: 3.13 Mil/uL — AB (ref 3.87–5.11)
RDW: 17.1 % — ABNORMAL HIGH (ref 11.5–15.5)
WBC: 5.5 10*3/uL (ref 4.0–10.5)

## 2016-11-04 LAB — IBC PANEL
Iron: 25 ug/dL — ABNORMAL LOW (ref 42–145)
Saturation Ratios: 8.4 % — ABNORMAL LOW (ref 20.0–50.0)
Transferrin: 212 mg/dL (ref 212.0–360.0)

## 2016-11-04 NOTE — Patient Instructions (Signed)
Thank you for choosing [facility].  SUMMARY AND INSTRUCTIONS:  Medication:  Please continue to take your medications as prescribed.   Labs:  Please stop by the lab on the lower level of the building for your blood work. Your results will be released to [place] (or called to you) after review, usually within 72 hours after test completion. If any changes need to be made, you will be notified at that same time.  1.) The lab is open from 7:30am to 5:30 pm Monday-Friday 2.) No appointment is necessary 3.) Fasting (if needed) is 6-8 hours after food and drink; black coffee and water are okay   Follow up:  If your symptoms worsen or fail to improve, please contact our office for further instruction, or in case of emergency go directly to the emergency room at the closest medical facility.   Health Maintenance  Topic Date Due  . INFLUENZA VACCINE  03/26/2017 (Originally 07/27/2016)  . DEXA SCAN  10/27/2017 (Originally 06/26/1997)  . ZOSTAVAX  10/27/2017 (Originally 06/26/1992)  . TETANUS/TDAP  10/27/2017 (Originally 12/27/2005)  . PNA vac Low Risk Adult (1 of 2 - PCV13) 10/27/2017 (Originally 06/26/1997)   Menopause is a normal process in which your reproductive ability comes to an end. This process happens gradually over a span of months to years, usually between the ages of 86 and 19. Menopause is complete when you have missed 12 consecutive menstrual periods. It is important to talk with your health care provider about some of the most common conditions that affect postmenopausal women, such as heart disease, cancer, and bone loss (osteoporosis). Adopting a healthy lifestyle and getting preventive care can help to promote your health and wellness. Those actions can also lower your chances of developing some of these common conditions. WHAT SHOULD I KNOW ABOUT MENOPAUSE? During menopause, you may experience a number of symptoms, such as:  Moderate-to-severe hot flashes.  Night  sweats.  Decrease in sex drive.  Mood swings.  Headaches.  Tiredness.  Irritability.  Memory problems.  Insomnia. Choosing to treat or not to treat menopausal changes is an individual decision that you make with your health care provider. WHAT SHOULD I KNOW ABOUT HORMONE REPLACEMENT THERAPY AND SUPPLEMENTS? Hormone therapy products are effective for treating symptoms that are associated with menopause, such as hot flashes and night sweats. Hormone replacement carries certain risks, especially as you become older. If you are thinking about using estrogen or estrogen with progestin treatments, discuss the benefits and risks with your health care provider. WHAT SHOULD I KNOW ABOUT HEART DISEASE AND STROKE? Heart disease, heart attack, and stroke become more likely as you age. This may be due, in part, to the hormonal changes that your body experiences during menopause. These can affect how your body processes dietary fats, triglycerides, and cholesterol. Heart attack and stroke are both medical emergencies. There are many things that you can do to help prevent heart disease and stroke:  Have your blood pressure checked at least every 1-2 years. High blood pressure causes heart disease and increases the risk of stroke.  If you are 77-30 years old, ask your health care provider if you should take aspirin to prevent a heart attack or a stroke.  Do not use any tobacco products, including cigarettes, chewing tobacco, or electronic cigarettes. If you need help quitting, ask your health care provider.  It is important to eat a healthy diet and maintain a healthy weight.  Be sure to include plenty of vegetables, fruits, low-fat  dairy products, and lean protein.  Avoid eating foods that are high in solid fats, added sugars, or salt (sodium).  Get regular exercise. This is one of the most important things that you can do for your health.  Try to exercise for at least 150 minutes each week. The  type of exercise that you do should increase your heart rate and make you sweat. This is known as moderate-intensity exercise.  Try to do strengthening exercises at least twice each week. Do these in addition to the moderate-intensity exercise.  Know your numbers.Ask your health care provider to check your cholesterol and your blood glucose. Continue to have your blood tested as directed by your health care provider. WHAT SHOULD I KNOW ABOUT CANCER SCREENING? There are several types of cancer. Take the following steps to reduce your risk and to catch any cancer development as early as possible. Breast Cancer  Practice breast self-awareness.  This means understanding how your breasts normally appear and feel.  It also means doing regular breast self-exams. Let your health care provider know about any changes, no matter how small.  If you are 5 or older, have a clinician do a breast exam (clinical breast exam or CBE) every year. Depending on your age, family history, and medical history, it may be recommended that you also have a yearly breast X-ray (mammogram).  If you have a family history of breast cancer, talk with your health care provider about genetic screening.  If you are at high risk for breast cancer, talk with your health care provider about having an MRI and a mammogram every year.  Breast cancer (BRCA) gene test is recommended for women who have family members with BRCA-related cancers. Results of the assessment will determine the need for genetic counseling and BRCA1 and for BRCA2 testing. BRCA-related cancers include these types:  Breast. This occurs in males or females.  Ovarian.  Tubal. This may also be called fallopian tube cancer.  Cancer of the abdominal or pelvic lining (peritoneal cancer).  Prostate.  Pancreatic. Cervical, Uterine, and Ovarian Cancer Your health care provider may recommend that you be screened regularly for cancer of the pelvic organs. These  include your ovaries, uterus, and vagina. This screening involves a pelvic exam, which includes checking for microscopic changes to the surface of your cervix (Pap test).  For women ages 21-65, health care providers may recommend a pelvic exam and a Pap test every three years. For women ages 36-65, they may recommend the Pap test and pelvic exam, combined with testing for human papilloma virus (HPV), every five years. Some types of HPV increase your risk of cervical cancer. Testing for HPV may also be done on women of any age who have unclear Pap test results.  Other health care providers may not recommend any screening for nonpregnant women who are considered low risk for pelvic cancer and have no symptoms. Ask your health care provider if a screening pelvic exam is right for you.  If you have had past treatment for cervical cancer or a condition that could lead to cancer, you need Pap tests and screening for cancer for at least 20 years after your treatment. If Pap tests have been discontinued for you, your risk factors (such as having a new sexual partner) need to be reassessed to determine if you should start having screenings again. Some women have medical problems that increase the chance of getting cervical cancer. In these cases, your health care provider may recommend that you  have screening and Pap tests more often.  If you have a family history of uterine cancer or ovarian cancer, talk with your health care provider about genetic screening.  If you have vaginal bleeding after reaching menopause, tell your health care provider.  There are currently no reliable tests available to screen for ovarian cancer. Lung Cancer Lung cancer screening is recommended for adults 4-71 years old who are at high risk for lung cancer because of a history of smoking. A yearly low-dose CT scan of the lungs is recommended if you:  Currently smoke.  Have a history of at least 30 pack-years of smoking and you  currently smoke or have quit within the past 15 years. A pack-year is smoking an average of one pack of cigarettes per day for one year. Yearly screening should:  Continue until it has been 15 years since you quit.  Stop if you develop a health problem that would prevent you from having lung cancer treatment. Colorectal Cancer  This type of cancer can be detected and can often be prevented.  Routine colorectal cancer screening usually begins at age 52 and continues through age 39.  If you have risk factors for colon cancer, your health care provider may recommend that you be screened at an earlier age.  If you have a family history of colorectal cancer, talk with your health care provider about genetic screening.  Your health care provider may also recommend using home test kits to check for hidden blood in your stool.  A small camera at the end of a tube can be used to examine your colon directly (sigmoidoscopy or colonoscopy). This is done to check for the earliest forms of colorectal cancer.  Direct examination of the colon should be repeated every 5-10 years until age 46. However, if early forms of precancerous polyps or small growths are found or if you have a family history or genetic risk for colorectal cancer, you may need to be screened more often. Skin Cancer  Check your skin from head to toe regularly.  Monitor any moles. Be sure to tell your health care provider:  About any new moles or changes in moles, especially if there is a change in a mole's shape or color.  If you have a mole that is larger than the size of a pencil eraser.  If any of your family members has a history of skin cancer, especially at a young age, talk with your health care provider about genetic screening.  Always use sunscreen. Apply sunscreen liberally and repeatedly throughout the day.  Whenever you are outside, protect yourself by wearing long sleeves, pants, a wide-brimmed hat, and  sunglasses. WHAT SHOULD I KNOW ABOUT OSTEOPOROSIS? Osteoporosis is a condition in which bone destruction happens more quickly than new bone creation. After menopause, you may be at an increased risk for osteoporosis. To help prevent osteoporosis or the bone fractures that can happen because of osteoporosis, the following is recommended:  If you are 54-80 years old, get at least 1,000 mg of calcium and at least 600 mg of vitamin D per day.  If you are older than age 68 but younger than age 48, get at least 1,200 mg of calcium and at least 600 mg of vitamin D per day.  If you are older than age 56, get at least 1,200 mg of calcium and at least 800 mg of vitamin D per day. Smoking and excessive alcohol intake increase the risk of osteoporosis. Eat foods that  are rich in calcium and vitamin D, and do weight-bearing exercises several times each week as directed by your health care provider. WHAT SHOULD I KNOW ABOUT HOW MENOPAUSE AFFECTS [place]? Depression may occur at any age, but it is more common as you become older. Common symptoms of depression include:  Low or sad mood.  Changes in sleep patterns.  Changes in appetite or eating patterns.  Feeling an overall lack of motivation or enjoyment of activities that you previously enjoyed.  Frequent crying spells. Talk with your health care provider if you think that you are experiencing depression. WHAT SHOULD I KNOW ABOUT IMMUNIZATIONS? It is important that you get and maintain your immunizations. These include:  Tetanus, diphtheria, and pertussis (Tdap) booster vaccine.  Influenza every year before the flu season begins.  Pneumonia vaccine.  Shingles vaccine. Your health care provider may also recommend other immunizations.   This information is not intended to replace advice given to you by your health care provider. Make sure you discuss any questions you have with your health care provider.   Document Released: 02/04/2006  Document Revised: 01/03/2015 Document Reviewed: 08/15/2014 Elsevier Interactive Patient Education [employer].

## 2016-11-04 NOTE — Assessment & Plan Note (Signed)
Heart failure appears stable with most recent 2-D echocardiogram. No evidence of fluid overload or increase fatigue with activity. Maintained on lisinopril and metoprolol. Continue to monitor and follow-up per cardiology.

## 2016-11-04 NOTE — Assessment & Plan Note (Signed)
Hypertension appears adequately controlled and below goal 150/90 with current regimen and no adverse side effects. Continue current dosage of losartan and metoprolol. Encouraged to monitor blood pressure at home and to follow sodium diet.

## 2016-11-04 NOTE — Assessment & Plan Note (Signed)
1) Anticipatory Guidance: Discussed importance of wearing a seatbelt while driving and not texting while driving; changing batteries in smoke detector at least once annually; wearing suntan lotion when outside; eating a balanced and moderate diet; getting physical activity at least 30 minutes per day.  2) Immunizations / Screenings / Labs:  Declines influenza, Zostavax, tetanus, and pneumococcal. All other immunizations are up-to-date per recommendations. Due for dental exam and vision exam encouraged to be completed independently. Declines DEXA for bone mineral density evaluation. Previous blood work reviewed and will obtain CBC and iron levels given previous history of iron deficiency anemia. All other screenings are up-to-date per recommendations.  Overall well exam with risk factors for cardiovascular disease including chronic systolic heart failure, continued tobacco use, and hypertension. Discussed importance of tobacco cessation to prevent further progression of any cardiovascular, respiratory or malignant processes/diseases. She is not ready to quit smoking at this time. Hypertension appears adequately controlled with current medication regimen. Continue other healthy lifestyle behaviors and choices. Follow-up prevention exam in 1 year. Follow-up office visit for chronic conditions.

## 2016-11-04 NOTE — Assessment & Plan Note (Signed)
Approximately 12.5-pack-year history and continues to smoke. Has quit smokeless tobacco. Not ready to quit smoking at this time. Continue to monitor.

## 2016-11-04 NOTE — Progress Notes (Signed)
Subjective:    Patient ID: Valerie Downs, female    DOB: 1930/12/02, 82 y.o.   MRN: [id number]  Chief Complaint  Patient presents with  . Establish Care    CPE, not fasting    HPI:  Valerie Downs is a 82 y.o. female who presents today for a Medicare Annual Wellness/Physical exam.    1) Health Maintenance -   Diet - Averages about 1-2 meals day consisting of a regular diet; Caffeine intake of about 1-2 cups.   Exercise - Walking around; no structured exercise  2) Preventative Exams / Immunizations:  Dental -- Due for exam  Vision -- Due for exam   Health Maintenance  Topic Date Due  . INFLUENZA VACCINE  03/26/2017 (Originally 07/27/2016)  . DEXA SCAN  10/27/2017 (Originally 06/26/1997)  . ZOSTAVAX  10/27/2017 (Originally 06/26/1992)  . TETANUS/TDAP  10/27/2017 (Originally 12/27/2005)  . PNA vac Low Risk Adult (1 of 2 - PCV13) 10/27/2017 (Originally 06/26/1997)     Immunization History  Administered Date(s) Administered  . Td 12/28/1995    RISK FACTORS  Tobacco History  Smoking Status  . Current Some Day Smoker  . Packs/day: 0.25  . Years: 50.00  . Types: Cigarettes  Smokeless Tobacco  . Former Systems developer  . Types: Snuff    Comment: per patient has not used snuff in a while (10/18/16)     Cardiac risk factors: advanced age (older than 59 for men, 63 for women), hypertension and smoking/ tobacco exposure.  Depression Screen  Depression screen PHQ 2/9 11/04/2016  Decreased Interest 0  Down, Depressed, Hopeless 0  PHQ - 2 Score 0     Activities of Daily Living In your present state of health, do you have any difficulty performing the following activities?:  Driving? No Managing money?  No Feeding yourself? No Getting from bed to chair? No Climbing a flight of stairs? No Preparing food and eating?: No Bathing or showering? No Getting dressed: No Getting to the toilet? No Using the toilet: No Moving around from place to place: No In the past year have you  fallen or had a near fall?:No   Home Safety Has smoke detector and wears seat belts. No excess sun exposure. Are there smokers in your home (other than you)?  No Do you feel safe at home?  Yes  Hearing Difficulties: No Do you often ask people to speak up or repeat themselves? No Do you experience ringing or noises in your ears? No  Do you have difficulty understanding soft or whispered voices? No    Cognitive Testing  Alert? Yes   Normal Appearance? Yes  Oriented to person? Yes  Place? Yes   Time? Yes  Recall of three objects?  Yes  Can perform simple calculations? Yes  Displays appropriate judgment? Yes  Can read the correct time from a watch face? Yes  Do you feel that you have a problem with memory? No  Do you often misplace items? Occasional    Advanced Directives have been discussed with the patient? Yes   Current Physicians/Providers and Suppliers  1. Terri Piedra, FNP - Internal Medicine 2. Everitt Amber, MD - Gynecology Oncology 3. Lauree Chandler, MD - Cardiology 4. Christinia Gully, MD - Pulmonology  Indicate any recent Medical Services you may have received from other than [facility] providers in the past year (date may be approximate).  All answers were reviewed with the patient and necessary referrals were made:  Mauricio Po, [place]   11/04/2016  Allergies  Allergen Reactions  . Penicillins Anaphylaxis and Swelling    Has patient had a PCN reaction causing immediate rash, facial/tongue/throat swelling, SOB or lightheadedness with hypotension: Yes Has patient had a PCN reaction causing severe rash involving mucus membranes or skin necrosis: Yes Has patient had a PCN reaction that required hospitalization Yes Has patient had a PCN reaction occurring within the last 10 years: No If all of the above answers are "NO", then may proceed with Cephalosporin use.      Outpatient Medications Prior to Visit  Medication Sig Dispense Refill  . aspirin EC 81 MG EC tablet  Take 1 tablet (81 mg total) by mouth daily.    . IRON PO Take 1 tablet by mouth daily.    Marland Kitchen losartan (COZAAR) 25 MG tablet Take 25 mg by mouth daily.    . metoprolol tartrate (LOPRESSOR) 25 MG tablet Take 1 tablet (25 mg total) by mouth 2 (two) times daily. 180 tablet 3  . Multiple Vitamins-Minerals (CENTRUM SILVER 50+WOMEN) TABS Take 1 tablet by mouth daily.     No facility-administered medications prior to visit.      Past Medical History:  Diagnosis Date  . Aortic insufficiency   . Arthritis   . Blood transfusion without reported diagnosis   . Breast cancer ([place])   . Cardiomyopathy ([place])   . CHF (congestive heart failure) ([place])   . Hypertension   . Mitral regurgitation      Past Surgical History:  Procedure Laterality Date  . COLONOSCOPY N/A 06/22/2014   Procedure: COLONOSCOPY;  Surgeon: Jerene Bears, MD;  Location: [facility];  Service: Endoscopy;  Laterality: N/A;  . ESOPHAGOGASTRODUODENOSCOPY N/A 06/22/2014   Procedure: ESOPHAGOGASTRODUODENOSCOPY (EGD);  Surgeon: Jerene Bears, MD;  Location: [facility];  Service: Endoscopy;  Laterality: N/A;  . MASTECTOMY Left      Family History  Problem Relation Age of Onset  . Hypertension Mother   . Cancer Father      Social History   Social History  . Marital status: Widowed    Spouse name: N/A  . Number of children: 10  . Years of education: 5   Occupational History  . Retired    Social History Main Topics  . Smoking status: Current Some Day Smoker    Packs/day: 0.25    Years: 50.00    Types: Cigarettes  . Smokeless tobacco: Former Systems developer    Types: Snuff     Comment: per patient has not used snuff in a while (10/18/16)  . Alcohol use No  . Drug use: No  . Sexual activity: No   Other Topics Concern  . Not on file   Social History Narrative   Denies abuse and feels safe at home.      Review of Systems  Constitutional: Denies fever, chills, fatigue, or significant weight gain/loss. HENT: Head: Denies  headache or neck pain Ears: Denies changes in hearing, ringing in ears, earache, drainage Nose: Denies discharge, stuffiness, itching, nosebleed, sinus pain Throat: Denies sore throat, hoarseness, dry mouth, sores, thrush Eyes: Denies loss/changes in vision, pain, redness, blurry/double vision, flashing lights Cardiovascular: Denies chest pain/discomfort, tightness, palpitations, shortness of breath with activity, difficulty lying down, swelling, sudden awakening with shortness of breath Respiratory: Denies shortness of breath, cough, sputum production, wheezing Gastrointestinal: Denies dysphasia, heartburn, change in appetite, nausea, change in bowel habits, rectal bleeding, constipation, diarrhea, yellow skin or eyes Genitourinary: Denies frequency, urgency, burning/pain, blood in urine, incontinence, change in urinary strength. Musculoskeletal:  Denies muscle/joint pain, stiffness, back pain, redness or swelling of joints, trauma Skin: Denies rashes, lumps, itching, dryness, color changes, or hair/nail changes Neurological: Denies dizziness, fainting, seizures, weakness, numbness, tingling, tremor Psychiatric - Denies nervousness, stress, depression or memory loss Endocrine: Denies heat or cold intolerance, sweating, frequent urination, excessive thirst, changes in appetite Hematologic: Denies ease of bruising or bleeding    Objective:    BP (!) 144/74 (BP Location: Left Arm, Patient Position: Sitting, Cuff Size: Normal)   Pulse 79   Temp 98.4 F (36.9 C) (Oral)   Resp 16   Ht 5\' 2"  (1.575 m)   Wt 122 lb 12.8 oz (55.7 kg)   SpO2 97%   BMI 22.46 kg/m  Nursing note and vital signs reviewed.  Physical Exam  Constitutional: She is oriented to person, place, and time. She appears well-developed and well-nourished.  HENT:  Head: Normocephalic.  Right Ear: Hearing, tympanic membrane, external ear and ear canal normal.  Left Ear: Hearing, tympanic membrane, external ear and ear canal  normal.  Nose: Nose normal.  Mouth/Throat: Uvula is midline, oropharynx is clear and moist and mucous membranes are normal.  Eyes: Conjunctivae and EOM are normal. Pupils are equal, round, and reactive to light.  Neck: Neck supple. No JVD present. No tracheal deviation present. No thyromegaly present.  Cardiovascular: Normal rate, regular rhythm, normal heart sounds and intact distal pulses.   Pulmonary/Chest: Effort normal and breath sounds normal.  Abdominal: Soft. Bowel sounds are normal. She exhibits no distension and no mass. There is no tenderness. There is no rebound and no guarding.  Musculoskeletal: Normal range of motion. She exhibits no edema or tenderness.  Lymphadenopathy:    She has no cervical adenopathy.  Neurological: She is alert and oriented to person, place, and time. She has normal reflexes. No cranial nerve deficit. She exhibits normal muscle tone. Coordination normal.  Skin: Skin is warm and dry.  Psychiatric: She has a normal mood and affect. Her behavior is normal. Judgment and thought content normal.       Assessment & Plan:   During the course of the visit the patient was educated and counseled about appropriate screening and preventive services including:    Pneumococcal vaccine   Influenza vaccine  Diabetes screening  Nutrition counseling   Smoking cessation counseling  Diet review for nutrition referral? Yes ____  Not Indicated _X___   Patient Instructions (the written plan) was given to the patient.  Medicare Attestation I have personally reviewed: The patient's medical and social history Their use of alcohol, tobacco or illicit drugs Their current medications and supplements The patient's functional ability including ADLs,fall risks, home safety risks, cognitive, and hearing and visual impairment Diet and physical activities Evidence for depression or mood disorders  The patient's weight, height, BMI,  have been recorded in the chart.  I  have made referrals, counseling, and provided education to the patient based on review of the above and I have provided the patient with a written personalized care plan for preventive services.     Problem List Items Addressed This Visit      Cardiovascular and Mediastinum   HYPERTENSION, BENIGN SYSTEMIC    Hypertension appears adequately controlled and below goal 150/90 with current regimen and no adverse side effects. Continue current dosage of losartan and metoprolol. Encouraged to monitor blood pressure at home and to follow sodium diet.      Acute systolic CHF (congestive heart failure) (HCC)    Heart failure appears stable  with most recent 2-D echocardiogram. No evidence of fluid overload or increase fatigue with activity. Maintained on lisinopril and metoprolol. Continue to monitor and follow-up per cardiology.        Other   Iron deficiency anemia - Primary    Obtain CBC and IBC panel to check current status. Continue current dosage of iron pending blood work results. Continue to monitor.      Relevant Orders   CBC (Completed)   IBC panel (Completed)   Cigarette smoker    Approximately 12.5-pack-year history and continues to smoke. Has quit smokeless tobacco. Not ready to quit smoking at this time. Continue to monitor.      Medicare annual wellness visit, subsequent    Reviewed and updated patient's medical, surgical, family and social history. Medications and allergies were also reviewed. Basic screenings for depression, activities of daily living, hearing, cognition and safety were performed. Provider list was updated and health plan was provided to the patient.       Routine general medical examination at a health care facility    1) Anticipatory Guidance: Discussed importance of wearing a seatbelt while driving and not texting while driving; changing batteries in smoke detector at least once annually; wearing suntan lotion when outside; eating a balanced and moderate  diet; getting physical activity at least 30 minutes per day.  2) Immunizations / Screenings / Labs:  Declines influenza, Zostavax, tetanus, and pneumococcal. All other immunizations are up-to-date per recommendations. Due for dental exam and vision exam encouraged to be completed independently. Declines DEXA for bone mineral density evaluation. Previous blood work reviewed and will obtain CBC and iron levels given previous history of iron deficiency anemia. All other screenings are up-to-date per recommendations.  Overall well exam with risk factors for cardiovascular disease including chronic systolic heart failure, continued tobacco use, and hypertension. Discussed importance of tobacco cessation to prevent further progression of any cardiovascular, respiratory or malignant processes/diseases. She is not ready to quit smoking at this time. Hypertension appears adequately controlled with current medication regimen. Continue other healthy lifestyle behaviors and choices. Follow-up prevention exam in 1 year. Follow-up office visit for chronic conditions.          I am having Ms. Kamer maintain her CENTRUM SILVER 50+WOMEN, metoprolol tartrate, aspirin, losartan, and IRON PO.   Follow-up: Return in about 3 months (around 02/04/2017), or if symptoms worsen or fail to improve.   Mauricio Po, FNP

## 2016-11-04 NOTE — Assessment & Plan Note (Signed)
Reviewed and updated patient's medical, surgical, family and social history. Medications and allergies were also reviewed. Basic screenings for depression, activities of daily living, hearing, cognition and safety were performed. Provider list was updated and health plan was provided to the patient.  

## 2016-11-04 NOTE — Assessment & Plan Note (Signed)
Obtain CBC and IBC panel to check current status. Continue current dosage of iron pending blood work results. Continue to monitor.

## 2016-11-08 NOTE — Progress Notes (Signed)
First available, 15 minute OK

## 2016-11-16 NOTE — Telephone Encounter (Signed)
Pt seeing BQ 11/24/2016.  Nothing further needed.

## 2016-11-24 ENCOUNTER — Encounter: Payer: Self-pay | Admitting: Pulmonary Disease

## 2016-11-24 ENCOUNTER — Ambulatory Visit (INDEPENDENT_AMBULATORY_CARE_PROVIDER_SITE_OTHER): Payer: Medicare Other | Admitting: Pulmonary Disease

## 2016-11-24 VITALS — BP 138/74 | HR 89 | Ht 62.0 in | Wt 121.6 lb

## 2016-11-24 DIAGNOSIS — F17200 Nicotine dependence, unspecified, uncomplicated: Secondary | ICD-10-CM | POA: Diagnosis not present

## 2016-11-24 DIAGNOSIS — R9389 Abnormal findings on diagnostic imaging of other specified body structures: Secondary | ICD-10-CM

## 2016-11-24 DIAGNOSIS — R918 Other nonspecific abnormal finding of lung field: Secondary | ICD-10-CM | POA: Diagnosis not present

## 2016-11-24 DIAGNOSIS — R938 Abnormal findings on diagnostic imaging of other specified body structures: Secondary | ICD-10-CM | POA: Diagnosis not present

## 2016-11-24 NOTE — Patient Instructions (Signed)
In regards to your abnormal chest CT: It is not clear if the abnormality on the CT chest represents lung cancer or if it is related to the likely ovarian cancer. However, considering how much you have smoked over the years it is very likely that this is lung cancer. Performing a biopsy would be a low risk procedure and would give [place] an answer. However, it is up to you to decide if you want to proceed with this procedure. The treatment of your condition would be based on the results of the biopsy.  If you have decided to proceed with the biopsy of the lung mass let me know and I will schedule the procedure

## 2016-11-24 NOTE — Progress Notes (Signed)
Subjective:    Patient ID: Valerie Downs, female    DOB: 07/19/1930, 82 y.o.   MRN: [id number]  Synopsis: Valerie Downs was referred to [facility] for a second opinion in 10/2016 in the setting of an abnormal PET CT scan showing a right lower lobe  HPI Chief Complaint  Patient presents with  . Follow-up    second opinion per MW for possibility of ebus.     Mr. this was referred to me for evaluation and second opinion about a lung mass. Valerie Downs is an 82 year old active smoker who smoked at least one half pack of cigarettes daily since young age. Valerie Downs continues to smoke. Valerie Downs's lost weight over the last 2 years. Valerie Downs was found in the last year to have an ovarian mass on the left side. A PET scan was ordered to evaluate this and also demonstrated a right hilar mass along with right mediastinal lymphadenopathy. Valerie Downs has been seen by gynecology oncology who felt that a surgical biopsy of the ovarian mass was of little utility considering her reluctance to undergo chemotherapy.  Valerie Downs tells me that Valerie Downs has coughed all of her life and Valerie Downs has some shortness of breath. Valerie Downs has not coughed up blood.  Further, Valerie Downs tells me that Valerie Downs is not interested in any sort of cancer treatment that involves chemotherapy because of prior experience with family members.   Past Medical History:  Diagnosis Date  . Aortic insufficiency   . Arthritis   . Blood transfusion without reported diagnosis   . Breast cancer ([place])   . Cardiomyopathy ([place])   . CHF (congestive heart failure) ([place])   . Hypertension   . Mitral regurgitation       Review of Systems  Constitutional: Negative for chills, fatigue and unexpected weight change.  HENT: Negative for nosebleeds, postnasal drip, rhinorrhea and sinus pain.   Respiratory: Positive for cough, shortness of breath and wheezing.   Cardiovascular: Negative for chest pain, palpitations and leg swelling.       Objective:   Physical Exam Vitals:   11/24/16 0904  BP: 138/74    Pulse: 89  SpO2: 96%  Weight: 121 lb 9.6 oz (55.2 kg)  Height: 5\' 2"  (1.575 m)   Gen: chronically ill appearing, no acute distress HENT: NCAT, OP clear, neck supple without masses Eyes: PERRL, EOMi Lymph: no cervical lymphadenopathy PULM: Crackles R base CV: RRR, no mgr, no JVD GI: BS+, soft, nontender, no hsm Derm: no rash or skin breakdown MSK: normal bulk and tone Neuro: A&Ox4, CN II-XII intact, strength 5/5 in all 4 extremities Psyche: normal mood and affect   Records from Dr. Melvyn Novas reviewed where Valerie Downs was seen for dyspnea found to have mild (gold grade a) COPD. Records from Dr. Denman George reviewed in the gynecology oncology clinic. Valerie Downs was noted to have a left adnexal mass which likely represented ovarian malignancy. However, I felt her surgical risk was too high for resection. He was also noted in Dr. Serita Grit note that the patient was not interested in chemotherapy so Dr. Denman George felt that a diagnostic procedure was not indicated.     Assessment & Plan:  Impression: Right lung mass of mediastinal lymphadenopathy Tobacco abuse Ovarian mass  Discussion: I have reviewed the images from the PET scan and in regards to the right hilar mass with mediastinal lymphadenopathy I agree that there is a high likelihood of lung cancer considering her tobacco use. However, this could also be related to ovarian cancer. I explained to her  today that the risk of undergoing a biopsy would be low and would give [place] an answer. However, Valerie Downs's reluctant to proceed because Valerie Downs's not interested in having any sort of treatment afterwards. I did explain that some forms of lung cancer treatment can be better tolerated than traditional chemotherapy. However, we would not know if this would be an option for her until we had a biopsy.  Plan: In regards to your abnormal chest CT: It is not clear if the abnormality on the CT chest represents lung cancer or if it is related to the likely ovarian cancer. However,  considering how much you have smoked over the years it is very likely that this is lung cancer. Performing a biopsy would be a low risk procedure and would give [place] an answer. However, it is up to you to decide if you want to proceed with this procedure. The treatment of your condition would be based on the results of the biopsy.  If you have decided to proceed with the biopsy of the lung mass let me know and I will schedule the procedure  > 50% of this 50 minute visit spent face to face   Current Outpatient Prescriptions:  .  aspirin EC 81 MG EC tablet, Take 1 tablet (81 mg total) by mouth daily., Disp: , Rfl:  .  IRON PO, Take 1 tablet by mouth daily., Disp: , Rfl:  .  losartan (COZAAR) 25 MG tablet, Take 25 mg by mouth daily., Disp: , Rfl:  .  metoprolol tartrate (LOPRESSOR) 25 MG tablet, Take 1 tablet (25 mg total) by mouth 2 (two) times daily., Disp: 180 tablet, Rfl: 3 .  Multiple Vitamins-Minerals (CENTRUM SILVER 50+WOMEN) TABS, Take 1 tablet by mouth daily., Disp: , Rfl:

## 2016-12-22 ENCOUNTER — Telehealth: Payer: Self-pay | Admitting: Pulmonary Disease

## 2016-12-22 DIAGNOSIS — Z01812 Encounter for preprocedural laboratory examination: Secondary | ICD-10-CM

## 2016-12-22 NOTE — Telephone Encounter (Signed)
Spoke with pt, states she does want to proceed with lung biopsy. BQ please advise on when/how you would like to schedule this.   Thanks!

## 2016-12-23 NOTE — Telephone Encounter (Signed)
OK she will need to have an EBUS.  Next week at [facility] in the morning Needs CBC first Please have [facility] start working on this

## 2016-12-24 NOTE — Telephone Encounter (Signed)
Will forward to [facility] to follow up on Tuesday when she is back in the office.

## 2016-12-29 ENCOUNTER — Encounter (HOSPITAL_COMMUNITY): Payer: Self-pay | Admitting: *Deleted

## 2016-12-29 NOTE — Telephone Encounter (Signed)
Libby please advise if we can now close this message?  Has the EBUS been scheduled?  thanks

## 2016-12-30 ENCOUNTER — Other Ambulatory Visit (INDEPENDENT_AMBULATORY_CARE_PROVIDER_SITE_OTHER): Payer: Medicare Other

## 2016-12-30 DIAGNOSIS — Z01812 Encounter for preprocedural laboratory examination: Secondary | ICD-10-CM

## 2016-12-30 LAB — CBC WITH DIFFERENTIAL/PLATELET
BASOS ABS: 0.1 10*3/uL (ref 0.0–0.1)
Basophils Relative: 1.1 % (ref 0.0–3.0)
EOS ABS: 0.1 10*3/uL (ref 0.0–0.7)
Eosinophils Relative: 0.7 % (ref 0.0–5.0)
HEMATOCRIT: 28.5 % — AB (ref 36.0–46.0)
HEMOGLOBIN: 10.1 g/dL — AB (ref 12.0–15.0)
LYMPHS PCT: 24.4 % (ref 12.0–46.0)
Lymphs Abs: 2.8 10*3/uL (ref 0.7–4.0)
MCHC: 35.3 g/dL (ref 30.0–36.0)
MCV: 87.9 fl (ref 78.0–100.0)
MONOS PCT: 9.2 % (ref 3.0–12.0)
Monocytes Absolute: 1.1 10*3/uL — ABNORMAL HIGH (ref 0.1–1.0)
NEUTROS ABS: 7.4 10*3/uL (ref 1.4–7.7)
Neutrophils Relative %: 64.6 % (ref 43.0–77.0)
Platelets: 129 10*3/uL — ABNORMAL LOW (ref 150.0–400.0)
RBC: 3.24 Mil/uL — AB (ref 3.87–5.11)
RDW: 18.4 % — ABNORMAL HIGH (ref 11.5–15.5)
WBC: 11.5 10*3/uL — AB (ref 4.0–10.5)

## 2016-12-30 NOTE — Addendum Note (Signed)
Addended by: Len Blalock on: 12/30/2016 09:55 AM   Modules accepted: Orders

## 2016-12-31 ENCOUNTER — Ambulatory Visit (HOSPITAL_COMMUNITY): Payer: Medicare Other | Admitting: Certified Registered"

## 2016-12-31 ENCOUNTER — Other Ambulatory Visit (HOSPITAL_COMMUNITY): Payer: Self-pay

## 2016-12-31 ENCOUNTER — Ambulatory Visit (HOSPITAL_COMMUNITY)
Admission: RE | Admit: 2016-12-31 | Discharge: 2016-12-31 | Disposition: A | Discharge status: Home / Self Care | Payer: Medicare Other | Source: Ambulatory Visit | Attending: Pulmonary Disease | Admitting: Pulmonary Disease

## 2016-12-31 ENCOUNTER — Encounter (HOSPITAL_COMMUNITY)
Admission: RE | Disposition: A | Discharge status: Home / Self Care | Payer: Self-pay | Source: Ambulatory Visit | Attending: Pulmonary Disease

## 2016-12-31 ENCOUNTER — Encounter (HOSPITAL_COMMUNITY): Payer: Self-pay

## 2016-12-31 ENCOUNTER — Ambulatory Visit (HOSPITAL_BASED_OUTPATIENT_CLINIC_OR_DEPARTMENT_OTHER)
Admission: RE | Admit: 2016-12-31 | Discharge: 2016-12-31 | Disposition: A | Discharge status: Home / Self Care | Payer: Medicare Other | Source: Ambulatory Visit | Attending: Pulmonary Disease | Admitting: Pulmonary Disease

## 2016-12-31 ENCOUNTER — Other Ambulatory Visit: Payer: Self-pay

## 2016-12-31 DIAGNOSIS — I493 Ventricular premature depolarization: Secondary | ICD-10-CM | POA: Diagnosis not present

## 2016-12-31 DIAGNOSIS — I10 Essential (primary) hypertension: Secondary | ICD-10-CM | POA: Diagnosis not present

## 2016-12-31 DIAGNOSIS — I5033 Acute on chronic diastolic (congestive) heart failure: Secondary | ICD-10-CM | POA: Diagnosis not present

## 2016-12-31 DIAGNOSIS — E875 Hyperkalemia: Secondary | ICD-10-CM | POA: Diagnosis not present

## 2016-12-31 DIAGNOSIS — D649 Anemia, unspecified: Secondary | ICD-10-CM

## 2016-12-31 DIAGNOSIS — I080 Rheumatic disorders of both mitral and aortic valves: Secondary | ICD-10-CM

## 2016-12-31 DIAGNOSIS — R846 Abnormal cytological findings in specimens from respiratory organs and thorax: Secondary | ICD-10-CM | POA: Diagnosis not present

## 2016-12-31 DIAGNOSIS — R911 Solitary pulmonary nodule: Secondary | ICD-10-CM | POA: Diagnosis not present

## 2016-12-31 DIAGNOSIS — Z7982 Long term (current) use of aspirin: Secondary | ICD-10-CM

## 2016-12-31 DIAGNOSIS — D509 Iron deficiency anemia, unspecified: Secondary | ICD-10-CM | POA: Diagnosis not present

## 2016-12-31 DIAGNOSIS — F17210 Nicotine dependence, cigarettes, uncomplicated: Secondary | ICD-10-CM | POA: Insufficient documentation

## 2016-12-31 DIAGNOSIS — N390 Urinary tract infection, site not specified: Secondary | ICD-10-CM | POA: Diagnosis not present

## 2016-12-31 DIAGNOSIS — Z809 Family history of malignant neoplasm, unspecified: Secondary | ICD-10-CM | POA: Diagnosis not present

## 2016-12-31 DIAGNOSIS — I272 Pulmonary hypertension, unspecified: Secondary | ICD-10-CM | POA: Diagnosis not present

## 2016-12-31 DIAGNOSIS — I712 Thoracic aortic aneurysm, without rupture: Secondary | ICD-10-CM | POA: Diagnosis not present

## 2016-12-31 DIAGNOSIS — R0602 Shortness of breath: Secondary | ICD-10-CM | POA: Diagnosis not present

## 2016-12-31 DIAGNOSIS — R05 Cough: Secondary | ICD-10-CM | POA: Diagnosis not present

## 2016-12-31 DIAGNOSIS — I248 Other forms of acute ischemic heart disease: Secondary | ICD-10-CM | POA: Diagnosis not present

## 2016-12-31 DIAGNOSIS — I509 Heart failure, unspecified: Secondary | ICD-10-CM | POA: Insufficient documentation

## 2016-12-31 DIAGNOSIS — I313 Pericardial effusion (noninflammatory): Secondary | ICD-10-CM | POA: Diagnosis not present

## 2016-12-31 DIAGNOSIS — Z880 Allergy status to penicillin: Secondary | ICD-10-CM | POA: Insufficient documentation

## 2016-12-31 DIAGNOSIS — R590 Localized enlarged lymph nodes: Secondary | ICD-10-CM | POA: Insufficient documentation

## 2016-12-31 DIAGNOSIS — I429 Cardiomyopathy, unspecified: Secondary | ICD-10-CM

## 2016-12-31 DIAGNOSIS — I110 Hypertensive heart disease with heart failure: Secondary | ICD-10-CM

## 2016-12-31 DIAGNOSIS — Z9010 Acquired absence of unspecified breast and nipple: Secondary | ICD-10-CM | POA: Diagnosis not present

## 2016-12-31 DIAGNOSIS — R0603 Acute respiratory distress: Secondary | ICD-10-CM | POA: Diagnosis not present

## 2016-12-31 DIAGNOSIS — Z853 Personal history of malignant neoplasm of breast: Secondary | ICD-10-CM | POA: Insufficient documentation

## 2016-12-31 DIAGNOSIS — N179 Acute kidney failure, unspecified: Secondary | ICD-10-CM | POA: Diagnosis not present

## 2016-12-31 DIAGNOSIS — R599 Enlarged lymph nodes, unspecified: Secondary | ICD-10-CM | POA: Diagnosis not present

## 2016-12-31 DIAGNOSIS — K922 Gastrointestinal hemorrhage, unspecified: Secondary | ICD-10-CM | POA: Diagnosis not present

## 2016-12-31 DIAGNOSIS — Z79899 Other long term (current) drug therapy: Secondary | ICD-10-CM | POA: Diagnosis not present

## 2016-12-31 DIAGNOSIS — R509 Fever, unspecified: Secondary | ICD-10-CM | POA: Diagnosis not present

## 2016-12-31 DIAGNOSIS — B370 Candidal stomatitis: Secondary | ICD-10-CM | POA: Diagnosis not present

## 2016-12-31 DIAGNOSIS — Z8249 Family history of ischemic heart disease and other diseases of the circulatory system: Secondary | ICD-10-CM | POA: Diagnosis not present

## 2016-12-31 HISTORY — PX: ENDOBRONCHIAL ULTRASOUND: SHX5096

## 2016-12-31 LAB — GLUCOSE, CAPILLARY: GLUCOSE-CAPILLARY: 75 mg/dL (ref 65–99)

## 2016-12-31 SURGERY — ENDOBRONCHIAL ULTRASOUND (EBUS)
Anesthesia: Monitor Anesthesia Care

## 2016-12-31 MED ORDER — LIDOCAINE 2% (20 MG/ML) 5 ML SYRINGE
INTRAMUSCULAR | Status: AC
  Filled 2016-12-31: qty 10

## 2016-12-31 MED ORDER — SUCCINYLCHOLINE CHLORIDE 200 MG/10ML IV SOSY
PREFILLED_SYRINGE | INTRAVENOUS | Status: AC
  Filled 2016-12-31: qty 10

## 2016-12-31 MED ORDER — PROMETHAZINE HCL 25 MG/ML IJ SOLN: 6.2500 mg | INTRAMUSCULAR | Status: DC | PRN

## 2016-12-31 MED ORDER — ONDANSETRON HCL 4 MG/2ML IJ SOLN
INTRAMUSCULAR | Status: DC | PRN
  Administered 2016-12-31: 4 mg via INTRAVENOUS

## 2016-12-31 MED ORDER — ONDANSETRON HCL 4 MG/2ML IJ SOLN
INTRAMUSCULAR | Status: AC
  Filled 2016-12-31: qty 2

## 2016-12-31 MED ORDER — HYDROMORPHONE HCL 1 MG/ML IJ SOLN: 0.2500 mg | INTRAMUSCULAR | Status: DC | PRN

## 2016-12-31 MED ORDER — PROPOFOL 10 MG/ML IV BOLUS
INTRAVENOUS | Status: DC | PRN
  Administered 2016-12-31: 110 mg via INTRAVENOUS

## 2016-12-31 MED ORDER — MIDAZOLAM HCL 2 MG/2ML IJ SOLN
INTRAMUSCULAR | Status: AC
  Filled 2016-12-31: qty 2

## 2016-12-31 MED ORDER — FENTANYL CITRATE (PF) 100 MCG/2ML IJ SOLN
INTRAMUSCULAR | Status: DC | PRN
  Administered 2016-12-31 (×2): 50 ug via INTRAVENOUS

## 2016-12-31 MED ORDER — LACTATED RINGERS IV SOLN
INTRAVENOUS | Status: DC
  Administered 2016-12-31 (×2): via INTRAVENOUS

## 2016-12-31 MED ORDER — FENTANYL CITRATE (PF) 100 MCG/2ML IJ SOLN
INTRAMUSCULAR | Status: AC
  Filled 2016-12-31: qty 2

## 2016-12-31 MED ORDER — LIDOCAINE 2% (20 MG/ML) 5 ML SYRINGE
INTRAMUSCULAR | Status: DC | PRN
  Administered 2016-12-31: 60 mg via INTRAVENOUS

## 2016-12-31 MED ORDER — SUCCINYLCHOLINE CHLORIDE 200 MG/10ML IV SOSY
PREFILLED_SYRINGE | INTRAVENOUS | Status: DC | PRN
  Administered 2016-12-31: 100 mg via INTRAVENOUS

## 2016-12-31 NOTE — Discharge Instructions (Signed)
Flexible Bronchoscopy, Care After These instructions give you information on caring for yourself after your procedure. Your doctor may also give you more specific instructions. Call your doctor if you have any problems or questions after your procedure. Follow these instructions at home:  Do not eat or drink anything for 2 hours after your procedure. If you try to eat or drink before the medicine wears off, food or drink could go into your lungs. You could also burn yourself.  After 2 hours have passed and when you can cough and gag normally, you may eat soft food and drink liquids slowly.  The day after the test, you may eat your normal diet.  You may do your normal activities.  Keep all doctor visits. Get help right away if:  You get more and more short of breath.  You get light-headed.  You feel like you are going to pass out (faint).  You have chest pain.  You have new problems that worry you.  You cough up more than a little blood.  You cough up more blood than before. This information is not intended to replace advice given to you by your health care provider. Make sure you discuss any questions you have with your health care provider. Document Released: 10/10/2009 Document Revised: 05/20/2016 Document Reviewed: 08/17/2013 Elsevier Interactive Patient Education  2017 [employer].

## 2016-12-31 NOTE — Transfer of Care (Signed)
Immediate Anesthesia Transfer of Care Note  Patient: Valerie Downs  Procedure(s) Performed: Procedure(s): ENDOBRONCHIAL ULTRASOUND (N/A)  Patient Location: PACU and Endoscopy Unit  Anesthesia Type:General  Level of Consciousness: awake, alert , oriented and patient cooperative  Airway & Oxygen Therapy: Patient Spontanous Breathing and Patient connected to face mask oxygen  Post-op Assessment: Report given to RN, Post -op Vital signs reviewed and stable and Patient moving all extremities  Post vital signs: Reviewed and stable  Last Vitals:  Vitals:   12/31/16 1312 12/31/16 1535  BP: (!) 171/69 (!) 183/78  Pulse: 92 81  Resp: 16 14  Temp: 36.8 C     Last Pain:  Vitals:   12/31/16 1312  TempSrc: Oral         Complications: No apparent anesthesia complications

## 2016-12-31 NOTE — Anesthesia Procedure Notes (Addendum)
Procedure Name: Intubation Date/Time: 12/31/2016 2:15 PM Performed by: Cynda Familia Pre-anesthesia Checklist: Patient identified, Emergency Drugs available, Suction available and Patient being monitored Patient Re-evaluated:Patient Re-evaluated prior to inductionOxygen Delivery Method: Circle System Utilized Preoxygenation: Pre-oxygenation with 100% oxygen Intubation Type: IV induction Ventilation: Mask ventilation without difficulty Laryngoscope Size: Miller and 2 Grade View: Grade I Tube type: Oral Tube size: 9.0 (as per [place] request) mm Number of attempts: 1 Airway Equipment and Method: Stylet Placement Confirmation: ETT inserted through vocal cords under direct vision,  positive ETCO2 and breath sounds checked- equal and bilateral Tube secured with: Tape Dental Injury: Teeth and Oropharynx as per pre-operative assessment  Comments: Smooth Iv induction Valerie Downs--- intubation AM CRNA atraumatic--- teeth and mouth as preop--- very poor dentition prior to laryngoscopy--- chipped missing teeth--- bilat BS Singer--  ETT advanced and manipulated by Valerie Downs

## 2016-12-31 NOTE — Anesthesia Postprocedure Evaluation (Signed)
Anesthesia Post Note  Patient: Valerie Downs  Procedure(s) Performed: Procedure(s) (LRB): ENDOBRONCHIAL ULTRASOUND (N/A)  Patient location during evaluation: Endoscopy Anesthesia Type: General Level of consciousness: sedated Pain management: pain level controlled Vital Signs Assessment: post-procedure vital signs reviewed and stable Respiratory status: spontaneous breathing and respiratory function stable Cardiovascular status: stable Anesthetic complications: no       Last Vitals:  Vitals:   12/31/16 1600 12/31/16 1610  BP: (!) 188/62 (!) 192/81  Pulse: 87 87  Resp: 19 20  Temp:      Last Pain:  Vitals:   12/31/16 1312  TempSrc: Oral                 Lasya Vetter DANIEL

## 2016-12-31 NOTE — Anesthesia Procedure Notes (Deleted)
Performed by: Fallyn Munnerlyn M       

## 2016-12-31 NOTE — Op Note (Signed)
[facility] Cardiopulmonary Patient Name: Valerie Downs Procedure Date: 12/31/2016 MRN: [id number] Attending MD: Juanito Doom , MD Date of Birth: 28-Jan-1930 CSN: [id number] Age: 83 Admit Type: Outpatient Ethnicity: Not Hispanic or Latino Procedure:            Bronchoscopy Indications:          Mediastinal adenopathy Providers:            Nathaneil Canary B. [place] Bells, MD, Hilma Favors, RN, Ralene Bathe,                        Technician Referring MD:          Medicines:            General Anesthesia Complications:        No immediate complications Estimated Blood Loss: Estimated blood loss was minimal. Procedure:      Pre-Anesthesia Assessment:      - A History and Physical has been performed. Patient meds and allergies       have been reviewed. The risks and benefits of the procedure and the       sedation options and risks were discussed with the patient. All       questions were answered and informed consent was obtained. Patient       identification and proposed procedure were verified prior to the       procedure by the physician, the nurse and the anesthesiologist in the       pre-procedure area. Mental Status Examination: alert and oriented.       Airway Examination: normal oropharyngeal airway. Respiratory       Examination: clear to auscultation. CV Examination: RRR, no murmurs, no       S3 or S4. ASA Grade Assessment: II - A patient with mild systemic       disease. After reviewing the risks and benefits, the patient was deemed       in satisfactory condition to undergo the procedure. The anesthesia plan       was to use general anesthesia. Immediately prior to administration of       medications, the patient was re-assessed for adequacy to receive       sedatives. The heart rate, respiratory rate, oxygen saturations, blood       pressure, adequacy of pulmonary ventilation, and response to care were       monitored throughout the procedure. The physical status of  the patient       was re-assessed after the procedure.      After obtaining informed consent, the bronchoscope was passed under       direct vision. Throughout the procedure, the patient's blood pressure,       pulse, and oxygen saturations were monitored continuously. the [id number]       [id number]) scope was introduced through the mouth, via the endotracheal       tube and advanced to the tracheobronchial tree. The procedure was       accomplished without difficulty. The patient tolerated the procedure       well. The total duration of the procedure was 42 minutes. Findings:      The endotracheal tube is in good position. The visualized portion of the       trachea is of normal caliber. The carina is sharp. The tracheobronchial       tree was examined to at least the first subsegmental level. Bronchial  mucosa and anatomy are normal; there are no endobronchial lesions, and       no secretions.      Endobronchial ultrasound was used to inspect the lymph nodes adjacent to       the tracheobronchial tree. There were no pathologically enlarged lymph       nodes in the left trachobronchial tree but the subcarinal (7) and R       pretracheal (4R) were both pathologically enlarged. Tissue samples were       taken from both of these and sent to pathology.      Endobronchial ultrasound was used assist with fine needle aspiration in       the pretracheal area and in the right paratracheal area.      Lesion localization and evaluation were performed via endobronchial       ultrasound for suspected lung cancer. Sampling by endobronchial needle       aspiration was also performed using an Olympus EBUS-TBNA 22 gauge needle       in the right paratracheal area and in the subcarinal area and sent for       routine cytology.      - The lesion in the subcarinal area measured 20 mm by EBUS. Eight       samples with the needle were obtained.      - The lesion in the right paratracheal area measured 16  mm by EBUS. Four       samples with the needle were obtained. Impression:      - Mediastinal adenopathy      - The examination was normal.      - Endobronchial ultrasound was performed.      - Two abnormal lymph nodes in the right lower paratracheal region (level       4R) and subcarinal mediastinum (level 7). Both were sampled by needle       aspiration. Moderate Sedation:      General anesthesia performed by anesthesia Recommendation:      - Await cytology results.      - Await cytology results. Procedure Code(s):      --- Professional ---      [phone], Bronchoscopy, rigid or flexible, including fluoroscopic guidance,       when performed; with bronchial or endobronchial biopsy(s), single or       multiple sites      31654, Bronchoscopy, rigid or flexible, including fluoroscopic guidance,       when performed; with transendoscopic endobronchial ultrasound (EBUS)       during bronchoscopic diagnostic or therapeutic intervention(s) for       peripheral lesion(s) (List separately in addition to code for primary       procedure[s]) Diagnosis Code(s):      --- Professional ---      R59.9, Enlarged lymph nodes, unspecified      R09.89, Other specified symptoms and signs involving the circulatory and       respiratory systems CPT copyright 2016 [employer]. All rights reserved. The codes documented in this report are preliminary and upon coder review may  be revised to meet current compliance requirements. Norlene Campbell, MD Juanito Doom, MD 12/31/2016 3:25:25 PM This report has been signed electronically. Number of Addenda: 0 Scope In: Scope Out:

## 2016-12-31 NOTE — Anesthesia Preprocedure Evaluation (Addendum)
Anesthesia Evaluation  Patient identified by MRN, date of birth, ID band Patient awake    Reviewed: Allergy & Precautions, NPO status , Patient's Chart, lab work & pertinent test results  History of Anesthesia Complications Negative for: history of anesthetic complications  Airway Mallampati: II  TM Distance: >3 FB Neck ROM: Full    Dental no notable dental hx. (+) Dental Advisory Given   Pulmonary Current Smoker,    Pulmonary exam normal        Cardiovascular hypertension, Pt. on medications and Pt. on home beta blockers +CHF  negative cardio ROS Normal cardiovascular exam  Study Conclusions  - Left ventricle: The cavity size was normal. There was severe   focal basal and moderate concentric hypertrophy of the left   ventricle . Systolic function was normal. The estimated ejection   fraction was in the range of 60% to 65%. Wall motion was normal;   there were no regional wall motion abnormalities. Doppler   parameters are consistent with abnormal left ventricular   relaxation (grade 1 diastolic dysfunction). Doppler parameters   are consistent with elevated ventricular end-diastolic filling   pressure. - Aortic valve: There was moderate regurgitation. - Aortic root: The aortic root was mildly dilated measuring 40 mm. - Mitral valve: Calcified annulus. Mildly thickened leaflets .   There was moderate regurgitation. - Left atrium: The atrium was severely dilated. - Tricuspid valve: There was mild regurgitation. - Pulmonic valve: There was mild regurgitation. - Pulmonary arteries: Systolic pressure was within the normal   range.   Neuro/Psych negative neurological ROS  negative psych ROS   GI/Hepatic negative GI ROS, Neg liver ROS,   Endo/Other  negative endocrine ROS  Renal/GU negative Renal ROS  negative genitourinary   Musculoskeletal negative musculoskeletal ROS (+)   Abdominal   Peds negative pediatric  ROS (+)  Hematology  (+) anemia ,   Anesthesia Other Findings   Reproductive/Obstetrics negative OB ROS                            Anesthesia Physical Anesthesia Plan  ASA: III  Anesthesia Plan: General   Post-op Pain Management:    Induction: Intravenous  Airway Management Planned: Oral ETT  Additional Equipment:   Intra-op Plan:   Post-operative Plan: Extubation in OR  Informed Consent: I have reviewed the patients History and Physical, chart, labs and discussed the procedure including the risks, benefits and alternatives for the proposed anesthesia with the patient or authorized representative who has indicated his/her understanding and acceptance.   Dental advisory given  Plan Discussed with: CRNA and Anesthesiologist  Anesthesia Plan Comments:       Anesthesia Quick Evaluation

## 2016-12-31 NOTE — H&P (Signed)
[facility]  HPI:  This is a pleasant 83 year old female who continues to smoke cigarettes was referred to me my my partner in November 2017 for evaluation of an abnormal CT scan chest. She had a PET/CT which showed mediastinal lymphadenopathy as well asa right lower lobe pulmonary nodule which were both metabolically active. In addition to that she had an ovarian mass which was also metabolically active.  She has a past medical history significant for aortic insufficiency and CHF. An echocardiogram in 2017 showed no evidence of systolic heart failure.  She also has a past medical history significant for breast cancer.  After some consideration she has decided that she wants to pursue a tissue diagnosis of her mediastinal lymphadenopathy in the event that this may be lung cancer or somehow related to the ovarian mass.  Past Medical History:  Diagnosis Date  . Aortic insufficiency   . Blood transfusion without reported diagnosis   . Breast cancer ([place])   . Cardiomyopathy ([place])   . CHF (congestive heart failure) ([place])   . Hypertension   . Mitral regurgitation      Family History  Problem Relation Age of Onset  . Hypertension Mother   . Cancer Father      Social History   Social History  . Marital status: Widowed    Spouse name: N/A  . Number of children: 10  . Years of education: 5   Occupational History  . Retired    Social History Main Topics  . Smoking status: Current Some Day Smoker    Packs/day: 0.25    Years: 50.00    Types: Cigarettes  . Smokeless tobacco: Former Systems developer    Types: Snuff     Comment: per patient has not used snuff in a while (10/18/16)  . Alcohol use No  . Drug use: No  . Sexual activity: No   Other Topics Concern  . Not on file   Social History Narrative   Denies abuse and feels safe at home.      Allergies  Allergen Reactions  . Penicillins Anaphylaxis and Swelling    Has patient had a PCN reaction  causing immediate rash, facial/tongue/throat swelling, SOB or lightheadedness with hypotension: Yes Has patient had a PCN reaction causing severe rash involving mucus membranes or skin necrosis: Yes Has patient had a PCN reaction that required hospitalization Yes Has patient had a PCN reaction occurring within the last 10 years: No If all of the above answers are "NO", then may proceed with Cephalosporin use.      @encmedstart @  Vitals:   12/31/16 1312  BP: (!) 171/69  Pulse: 92  Resp: 16  Temp: 98.2 F (36.8 C)  TempSrc: Oral  SpO2: 98%  Weight: 114 lb (51.7 kg)  Height: 5\' 2"  (1.575 m)   RA  Gen: well appearing HENT: OP clear, TM's clear, neck supple PULM: CTA B, normal percussion CV: RRR, systolic murmur, trace edema, mild JVD GI: BS+, soft, nontender Derm: no cyanosis or rash Psyche: normal mood and affect  CBC    Component Value Date/Time   WBC 11.5 (H) 12/30/2016 1632   RBC 3.24 (L) 12/30/2016 1632   HGB 10.1 (L) 12/30/2016 1632   HCT 28.5 (L) 12/30/2016 1632   PLT 129.0 (L) 12/30/2016 1632   MCV 87.9 12/30/2016 1632   MCH 27.7 07/07/2016 0152   MCHC 35.3 12/30/2016 1632   RDW 18.4 (H) 12/30/2016 1632   LYMPHSABS 2.8 12/30/2016 1632  MONOABS 1.1 (H) 12/30/2016 1632   EOSABS 0.1 12/30/2016 1632   BASOSABS 0.1 12/30/2016 1632    PET/CT images again personally reviewed showing mediastinal lymphadenopathy specifically a right paratracheal node which is FDG avid a greater than 1 cm there is also a right lower lobe mass  Impression/plan  Mediastinal lymphadenopathy with a right lower lobe mass in a smoker: Obviously lung cancer is very high on the differential diagnosis but considering the large ovarian mass this may also be metastatic disease. She is willing to proceed with an endobronchial ultrasound-guided needle aspiration of the mediastinal lymphadenopathy to try to get to the bottom of what is going on. She understands the risks and benefits and is willing  to proceed  Plan today is endobronchial ultrasound with needle aspiration of mediastinal lymph nodes  Roselie Awkward, MD [facility] Pager: [phone] Cell: [phone] After 3pm or if no response, call [phone]

## 2017-01-01 ENCOUNTER — Inpatient Hospital Stay (HOSPITAL_COMMUNITY)
Admission: EM | Admit: 2017-01-01 | Discharge: 2017-01-06 | DRG: 811 | Disposition: A | Discharge status: Home Health | Payer: Medicare Other | Attending: Internal Medicine | Admitting: Internal Medicine

## 2017-01-01 ENCOUNTER — Encounter (HOSPITAL_COMMUNITY): Payer: Self-pay | Admitting: Emergency Medicine

## 2017-01-01 ENCOUNTER — Emergency Department (HOSPITAL_COMMUNITY): Payer: Medicare Other

## 2017-01-01 DIAGNOSIS — I5032 Chronic diastolic (congestive) heart failure: Secondary | ICD-10-CM

## 2017-01-01 DIAGNOSIS — F17210 Nicotine dependence, cigarettes, uncomplicated: Secondary | ICD-10-CM | POA: Diagnosis present

## 2017-01-01 DIAGNOSIS — I493 Ventricular premature depolarization: Secondary | ICD-10-CM | POA: Diagnosis present

## 2017-01-01 DIAGNOSIS — Z880 Allergy status to penicillin: Secondary | ICD-10-CM

## 2017-01-01 DIAGNOSIS — Z9010 Acquired absence of unspecified breast and nipple: Secondary | ICD-10-CM

## 2017-01-01 DIAGNOSIS — Z853 Personal history of malignant neoplasm of breast: Secondary | ICD-10-CM

## 2017-01-01 DIAGNOSIS — D649 Anemia, unspecified: Secondary | ICD-10-CM | POA: Diagnosis not present

## 2017-01-01 DIAGNOSIS — Z5329 Procedure and treatment not carried out because of patient's decision for other reasons: Secondary | ICD-10-CM | POA: Diagnosis present

## 2017-01-01 DIAGNOSIS — R071 Chest pain on breathing: Secondary | ICD-10-CM | POA: Diagnosis not present

## 2017-01-01 DIAGNOSIS — R0602 Shortness of breath: Secondary | ICD-10-CM | POA: Diagnosis present

## 2017-01-01 DIAGNOSIS — D509 Iron deficiency anemia, unspecified: Secondary | ICD-10-CM | POA: Diagnosis not present

## 2017-01-01 DIAGNOSIS — I080 Rheumatic disorders of both mitral and aortic valves: Secondary | ICD-10-CM | POA: Diagnosis present

## 2017-01-01 DIAGNOSIS — R7989 Other specified abnormal findings of blood chemistry: Secondary | ICD-10-CM | POA: Diagnosis not present

## 2017-01-01 DIAGNOSIS — R0603 Acute respiratory distress: Secondary | ICD-10-CM | POA: Diagnosis not present

## 2017-01-01 DIAGNOSIS — I272 Pulmonary hypertension, unspecified: Secondary | ICD-10-CM | POA: Diagnosis present

## 2017-01-01 DIAGNOSIS — Z8249 Family history of ischemic heart disease and other diseases of the circulatory system: Secondary | ICD-10-CM | POA: Diagnosis not present

## 2017-01-01 DIAGNOSIS — Z809 Family history of malignant neoplasm, unspecified: Secondary | ICD-10-CM

## 2017-01-01 DIAGNOSIS — Z7982 Long term (current) use of aspirin: Secondary | ICD-10-CM

## 2017-01-01 DIAGNOSIS — I110 Hypertensive heart disease with heart failure: Secondary | ICD-10-CM | POA: Diagnosis present

## 2017-01-01 DIAGNOSIS — R509 Fever, unspecified: Secondary | ICD-10-CM | POA: Diagnosis not present

## 2017-01-01 DIAGNOSIS — R590 Localized enlarged lymph nodes: Secondary | ICD-10-CM | POA: Diagnosis not present

## 2017-01-01 DIAGNOSIS — R918 Other nonspecific abnormal finding of lung field: Secondary | ICD-10-CM | POA: Diagnosis present

## 2017-01-01 DIAGNOSIS — I10 Essential (primary) hypertension: Secondary | ICD-10-CM | POA: Diagnosis not present

## 2017-01-01 DIAGNOSIS — I3139 Other pericardial effusion (noninflammatory): Secondary | ICD-10-CM | POA: Diagnosis present

## 2017-01-01 DIAGNOSIS — R748 Abnormal levels of other serum enzymes: Secondary | ICD-10-CM | POA: Diagnosis not present

## 2017-01-01 DIAGNOSIS — E875 Hyperkalemia: Secondary | ICD-10-CM | POA: Diagnosis present

## 2017-01-01 DIAGNOSIS — I5033 Acute on chronic diastolic (congestive) heart failure: Secondary | ICD-10-CM | POA: Diagnosis not present

## 2017-01-01 DIAGNOSIS — I248 Other forms of acute ischemic heart disease: Secondary | ICD-10-CM | POA: Diagnosis present

## 2017-01-01 DIAGNOSIS — N838 Other noninflammatory disorders of ovary, fallopian tube and broad ligament: Secondary | ICD-10-CM | POA: Diagnosis present

## 2017-01-01 DIAGNOSIS — R079 Chest pain, unspecified: Secondary | ICD-10-CM | POA: Diagnosis present

## 2017-01-01 DIAGNOSIS — R05 Cough: Secondary | ICD-10-CM | POA: Diagnosis not present

## 2017-01-01 DIAGNOSIS — N839 Noninflammatory disorder of ovary, fallopian tube and broad ligament, unspecified: Secondary | ICD-10-CM | POA: Diagnosis present

## 2017-01-01 DIAGNOSIS — N179 Acute kidney failure, unspecified: Secondary | ICD-10-CM | POA: Diagnosis not present

## 2017-01-01 DIAGNOSIS — I313 Pericardial effusion (noninflammatory): Secondary | ICD-10-CM | POA: Diagnosis present

## 2017-01-01 DIAGNOSIS — R0600 Dyspnea, unspecified: Secondary | ICD-10-CM | POA: Diagnosis not present

## 2017-01-01 DIAGNOSIS — I319 Disease of pericardium, unspecified: Secondary | ICD-10-CM | POA: Diagnosis not present

## 2017-01-01 DIAGNOSIS — I712 Thoracic aortic aneurysm, without rupture: Secondary | ICD-10-CM | POA: Diagnosis present

## 2017-01-01 DIAGNOSIS — I429 Cardiomyopathy, unspecified: Secondary | ICD-10-CM | POA: Diagnosis present

## 2017-01-01 DIAGNOSIS — B370 Candidal stomatitis: Secondary | ICD-10-CM | POA: Diagnosis present

## 2017-01-01 DIAGNOSIS — R0609 Other forms of dyspnea: Secondary | ICD-10-CM | POA: Diagnosis not present

## 2017-01-01 DIAGNOSIS — Z79899 Other long term (current) drug therapy: Secondary | ICD-10-CM

## 2017-01-01 DIAGNOSIS — R778 Other specified abnormalities of plasma proteins: Secondary | ICD-10-CM | POA: Diagnosis present

## 2017-01-01 DIAGNOSIS — R911 Solitary pulmonary nodule: Secondary | ICD-10-CM | POA: Diagnosis present

## 2017-01-01 DIAGNOSIS — Z7189 Other specified counseling: Secondary | ICD-10-CM

## 2017-01-01 DIAGNOSIS — R062 Wheezing: Secondary | ICD-10-CM | POA: Diagnosis not present

## 2017-01-01 DIAGNOSIS — R260 Ataxic gait: Secondary | ICD-10-CM | POA: Diagnosis not present

## 2017-01-01 LAB — BASIC METABOLIC PANEL
ANION GAP: 5 (ref 5–15)
BUN: 32 mg/dL — AB (ref 6–20)
CO2: 23 mmol/L (ref 22–32)
CREATININE: 1.65 mg/dL — AB (ref 0.44–1.00)
Calcium: 9 mg/dL (ref 8.9–10.3)
Chloride: 110 mmol/L (ref 101–111)
GFR calc Af Amer: 32 mL/min — ABNORMAL LOW (ref >60)
GFR, EST NON AFRICAN AMERICAN: 27 mL/min — AB (ref >60)
GLUCOSE: 101 mg/dL — AB (ref 65–99)
Potassium: 5.3 mmol/L — ABNORMAL HIGH (ref 3.5–5.1)
Sodium: 138 mmol/L (ref 135–145)

## 2017-01-01 LAB — URINALYSIS, ROUTINE W REFLEX MICROSCOPIC
Bilirubin Urine: NEGATIVE
GLUCOSE, UA: NEGATIVE mg/dL
Hgb urine dipstick: NEGATIVE
Ketones, ur: NEGATIVE mg/dL
Nitrite: NEGATIVE
PROTEIN: 100 mg/dL — AB
SPECIFIC GRAVITY, URINE: 1.025 (ref 1.005–1.030)
pH: 5.5 (ref 5.0–8.0)

## 2017-01-01 LAB — CBC WITH DIFFERENTIAL/PLATELET
Basophils Absolute: 0 10*3/uL (ref 0.0–0.1)
Basophils Relative: 0 %
EOS PCT: 0 %
Eosinophils Absolute: 0 10*3/uL (ref 0.0–0.7)
HEMATOCRIT: 23 % — AB (ref 36.0–46.0)
Hemoglobin: 6.9 g/dL — CL (ref 12.0–15.0)
LYMPHS ABS: 2.1 10*3/uL (ref 0.7–4.0)
LYMPHS PCT: 32 %
MCH: 27.4 pg (ref 26.0–34.0)
MCHC: 30 g/dL (ref 30.0–36.0)
MCV: 91.3 fL (ref 78.0–100.0)
MONO ABS: 0.9 10*3/uL (ref 0.1–1.0)
MONOS PCT: 13 %
NEUTROS ABS: 3.6 10*3/uL (ref 1.7–7.7)
Neutrophils Relative %: 55 %
PLATELETS: 217 10*3/uL (ref 150–400)
RBC: 2.52 MIL/uL — ABNORMAL LOW (ref 3.87–5.11)
RDW: 16.9 % — AB (ref 11.5–15.5)
WBC: 6.6 10*3/uL (ref 4.0–10.5)

## 2017-01-01 LAB — TROPONIN I: Troponin I: 0.23 ng/mL (ref <0.03)

## 2017-01-01 LAB — BRAIN NATRIURETIC PEPTIDE: B Natriuretic Peptide: 2298 pg/mL — ABNORMAL HIGH (ref 0.0–100.0)

## 2017-01-01 LAB — URINALYSIS, MICROSCOPIC (REFLEX)

## 2017-01-01 LAB — POC OCCULT BLOOD, ED: Fecal Occult Bld: NEGATIVE

## 2017-01-01 LAB — I-STAT CG4 LACTIC ACID, ED: Lactic Acid, Venous: 1.28 mmol/L (ref 0.5–1.9)

## 2017-01-01 LAB — PREPARE RBC (CROSSMATCH)

## 2017-01-01 MED ORDER — ACETAMINOPHEN 500 MG PO TABS
1000.0000 mg | ORAL_TABLET | Freq: Once | ORAL | Status: AC
  Administered 2017-01-01: 1000 mg via ORAL
  Filled 2017-01-01: qty 2

## 2017-01-01 MED ORDER — SODIUM CHLORIDE 0.9 % IV SOLN
INTRAVENOUS | Status: DC
  Administered 2017-01-01 – 2017-01-03 (×2): via INTRAVENOUS

## 2017-01-01 MED ORDER — IPRATROPIUM-ALBUTEROL 0.5-2.5 (3) MG/3ML IN SOLN
3.0000 mL | Freq: Once | RESPIRATORY_TRACT | Status: AC
  Administered 2017-01-01: 3 mL via RESPIRATORY_TRACT
  Filled 2017-01-01: qty 3

## 2017-01-01 MED ORDER — SODIUM CHLORIDE 0.9 % IV SOLN: Freq: Once | INTRAVENOUS | Status: DC

## 2017-01-01 MED ORDER — IOPAMIDOL (ISOVUE-370) INJECTION 76%
INTRAVENOUS | Status: AC
  Administered 2017-01-01: 100 mL via INTRAVENOUS
  Filled 2017-01-01: qty 100

## 2017-01-01 NOTE — H&P (Signed)
History and Physical    LASHAWN SOKOLOW [id number] DOB: 07-Feb-1930 DOA: 01/01/2017  PCP: Mauricio Po, FNP Consultants:  Lake Bells, pulmonology; Angelena Form, cardiology; Denman George - Gyn-Onc Patient coming from: home  Chief Complaint: SOB  HPI: Valerie Downs is a 83 y.o. female with medical history significant of breast cancer (s/p mastectomy 1984), HTN, CHF, aortic insufficiency and mitral regurgitation presenting to the ER. With SOB.  At the time of my evaluation, the patient was very tired and was extremely frustrated at having been in the ER for so long (I was initially paged about 6:30 pm but asked the pulmonology be paged since the problem appears to be related to a procedure done yesterday); by the time the evaluation was completed, it was after 10pm.  She basically slept and refused to speak with me.  HPI per ER. PA:  Pt reports she had left lung biopsy performed yesterday and denies any complications s/p procedure but notes when she woke up at 8am she had worsening SOB. She also notes she has had a productive cough ("cream sputum") for the past 3 days. Pt also reports she has been having intermittent midsternal CP for the past 3 days that typically occurs at night while she is laying in bed. She reports having heavy pressure sensation that only lasts a few seconds and then resolves spontaneously. Denies fever, chills, HA, rhinorrhea, hemoptysis, wheezing, palpitations, abdominal pain, N/V/D, urinary sxs, leg swelling. Pt reports smoking 2-3 cigarettes daily.  Chart review shows pt was seen yesterday by Dr. Lake Bells due to PET/CT scan showing mediastinal lymphadenopathy and right lower lobe pulmonary nodule which were both metabolically active; she also had ovarian mass which was metabolically active. Endobronchial US-guided needle aspiration of mediastinal lymphadenopathy performed.     ED Course: Per PA-C [place]: Pt presents from home with worsening SOB that started this morning. Reports having a  lung biopsy performed yesterday. Initial vitals showed rectal temp 102.4, remaining vitals stable. Exam revealed frail, elderly appearing female. Mild rales and wheezing noted bilaterally with mild decreased breath sounds throughout. Pt with mild increased work of breathing, RR 28. Pt given albuterol neb and reports improvement of SOB. Hgb 6.9. Trop 0.23. BNP 2,298. CXR showed stable cardiomegaly and vascular congestion, no superimposed acute process. EKG showed sinus rhythm with no acute ischemic changes noted.  Discussed case with Dr. Zenia Resides who evaluated pt in the ED. Plan to order CTA for evaluation of PE. PT denies any melena, hemoptysis, hematuria or hematemesis. Orders placed for blood transfusion. Hemoccult negative. BUN 32. Cr 1.65. Due to worsening renal function CTA canceled. Consulted hospitalist, Dr. Lorin Mercy reported that pt needed to have CT imaging performed for further evaluation and then asked to consult pulmonology. I was then notified by radiology that with pt's GFR, she was able to have a reduced dose of contrast for CTA.  CT showed no PE. Known malignancy in the medial right lower lobe posterior to the right lower lobe bronchus is larger in the interval. A pre tracheal node is more prominent in the interval. New pericardial effusion posteriorly only measuring 2 cm. New small pleural effusions. New small nodule in the left apex, recommend attention on follow-up. The ascending thoracic aorta measures 4.7 cm which is stable. Ascending thoracic aortic aneurysm. Recommend semi-annual imaging followup by CTA or MRA and referral to cardiothoracic surgery if not already obtained. [place] pulmonology, recommend admitting to medicine and state they will consult pt during her admission. Consulted hospitalist, Dr. Lorin Mercy agrees to admission. Orders  placed for telemetry med. Discussed results and plan for admission with pt.    Review of Systems: Unable to perform \   Past Medical History:    Diagnosis Date  . Aortic insufficiency   . Blood transfusion without reported diagnosis   . Breast cancer ([place])   . Cardiomyopathy ([place])   . CHF (congestive heart failure) ([place])   . Hypertension   . Mitral regurgitation     Past Surgical History:  Procedure Laterality Date  . COLONOSCOPY N/A 06/22/2014   Procedure: COLONOSCOPY;  Surgeon: Jerene Bears, MD;  Location: [facility]COPY;  Service: Endoscopy;  Laterality: N/A;  . ESOPHAGOGASTRODUODENOSCOPY N/A 06/22/2014   Procedure: ESOPHAGOGASTRODUODENOSCOPY (EGD);  Surgeon: Jerene Bears, MD;  Location: [facility];  Service: Endoscopy;  Laterality: N/A;  . MASTECTOMY Left     Social History   Social History  . Marital status: Widowed    Spouse name: N/A  . Number of children: 10  . Years of education: 5   Occupational History  . Retired    Social History Main Topics  . Smoking status: Current Some Day Smoker    Packs/day: 0.25    Years: 50.00    Types: Cigarettes  . Smokeless tobacco: Former Systems developer    Types: Snuff     Comment: per patient has not used snuff in a while (10/18/16)  . Alcohol use No  . Drug use: No  . Sexual activity: No   Other Topics Concern  . Not on file   Social History Narrative   Denies abuse and feels safe at home.     Allergies  Allergen Reactions  . Penicillins Anaphylaxis and Swelling    Has patient had a PCN reaction causing immediate rash, facial/tongue/throat swelling, SOB or lightheadedness with hypotension: Yes Has patient had a PCN reaction causing severe rash involving mucus membranes or skin necrosis: Yes Has patient had a PCN reaction that required hospitalization Yes Has patient had a PCN reaction occurring within the last 10 years: No If all of the above answers are "NO", then may proceed with Cephalosporin use.     Family History  Problem Relation Age of Onset  . Hypertension Mother   . Cancer Father     Prior to Admission medications   Medication Sig Start Date End Date  Taking? Authorizing Provider  aspirin EC 325 MG tablet Take 325 mg by mouth daily.   Yes Historical Provider, MD  ferrous gluconate (IRON 27) 240 (27 FE) MG tablet Take 240 mg by mouth daily.   Yes Historical Provider, MD  losartan (COZAAR) 25 MG tablet Take 25 mg by mouth daily.   Yes Historical Provider, MD  metoprolol tartrate (LOPRESSOR) 25 MG tablet Take 1 tablet (25 mg total) by mouth 2 (two) times daily. 06/09/16  Yes Burtis Junes, NP  Multiple Vitamins-Minerals (CENTRUM SILVER 50+WOMEN) TABS Take 1 tablet by mouth daily.   Yes Historical Provider, MD    Physical Exam: Vitals:   01/01/17 2315 01/01/17 2330 01/02/17 0015 01/02/17 0016  BP: 175/86 178/81 153/77   Pulse: 92 92 93   Resp: 24 (!) 28 26 26   Temp: 98.3 F (36.8 C)     TempSrc: Oral     SpO2: 100% 99% 100%   Weight:      Height:         General:  Appears somnolent, cantankerous vs. confused Eyes:  PERRL, EOMI, normal lids, iris ENT:  grossly normal hearing, lips & tongue, mmm Neck:  no LAD, masses or thyromegaly Cardiovascular:  Irregularly irregular, rate controlled, no m/r/g. No LE edema.  Respiratory: CTA bilaterally, no w/r/r. Mildly increased respiratory effort. Abdomen: soft, ntnd, NABS Skin:  no rash or induration seen on limited exam Musculoskeletal:  grossly normal tone BUE/BLE, good ROM, no bony abnormality Psychiatric:  flat affect, very somnolent, reluctant to answer questions - ?encephalopathy vs. Sedation vs. Behavioral Neurologic:  Unable to perform  Labs on Admission: I have personally reviewed following labs and imaging studies  CBC:  Recent Labs Lab 12/30/16 1632 01/01/17 1558  WBC 11.5* 6.6  NEUTROABS 7.4 3.6  HGB 10.1* 6.9*  HCT 28.5* 23.0*  MCV 87.9 91.3  PLT 129.0* [id number]   Basic Metabolic Panel:  Recent Labs Lab 01/01/17 1558  NA 138  K 5.3*  CL 110  CO2 23  GLUCOSE 101*  BUN 32*  CREATININE 1.65*  CALCIUM 9.0   GFR: Estimated Creatinine Clearance: 20.1 mL/min (by  C-G formula based on SCr of 1.65 mg/dL (H)). Liver Function Tests: No results for input(s): AST, ALT, ALKPHOS, BILITOT, PROT, ALBUMIN in the last 168 hours. No results for input(s): LIPASE, AMYLASE in the last 168 hours. No results for input(s): AMMONIA in the last 168 hours. Coagulation Profile: No results for input(s): INR, PROTIME in the last 168 hours. Cardiac Enzymes:  Recent Labs Lab 01/01/17 1558  TROPONINI 0.23*   BNP (last 3 results) No results for input(s): PROBNP in the last 8760 hours. HbA1C: No results for input(s): HGBA1C in the last 72 hours. CBG:  Recent Labs Lab 12/31/16 1617  GLUCAP 75   Lipid Profile: No results for input(s): CHOL, HDL, LDLCALC, TRIG, CHOLHDL, LDLDIRECT in the last 72 hours. Thyroid Function Tests: No results for input(s): TSH, T4TOTAL, FREET4, T3FREE, THYROIDAB in the last 72 hours. Anemia Panel: No results for input(s): VITAMINB12, FOLATE, FERRITIN, TIBC, IRON, RETICCTPCT in the last 72 hours. Urine analysis:    Component Value Date/Time   COLORURINE YELLOW 01/01/2017 1713   APPEARANCEUR CLEAR 01/01/2017 1713   LABSPEC 1.025 01/01/2017 1713   PHURINE 5.5 01/01/2017 1713   GLUCOSEU NEGATIVE 01/01/2017 1713   HGBUR NEGATIVE 01/01/2017 1713   BILIRUBINUR NEGATIVE 01/01/2017 1713   KETONESUR NEGATIVE 01/01/2017 1713   PROTEINUR 100 (A) 01/01/2017 1713   UROBILINOGEN 0.2 08/16/2014 0535   NITRITE NEGATIVE 01/01/2017 1713   LEUKOCYTESUR TRACE (A) 01/01/2017 1713    Creatinine Clearance: Estimated Creatinine Clearance: 20.1 mL/min (by C-G formula based on SCr of 1.65 mg/dL (H)).  Sepsis Labs: @LABRCNTIP (procalcitonin:4,lacticidven:4) )No results found for this or any previous visit (from the past 240 hour(s)).   Radiological Exams on Admission: Ct Angio Chest Pe W And/or Wo Contrast  Result Date: 01/01/2017 CLINICAL DATA:  Right lung biopsy yesterday.  Respiratory distress. EXAM: CT ANGIOGRAPHY CHEST WITH CONTRAST TECHNIQUE:  Multidetector CT imaging of the chest was performed using the standard protocol during bolus administration of intravenous contrast. Multiplanar CT image reconstructions and MIPs were obtained to evaluate the vascular anatomy. CONTRAST:  80 cc of Isovue 370 COMPARISON:  None. FINDINGS: Cardiovascular: Cardiomegaly is identified. Coronary artery calcifications are noted. The ascending thoracic aorta measures 4.7 cm in AP dimension on series 4, image 46 unchanged in the interval. No dissection. No pulmonary emboli. Mediastinum/Nodes: Small bilateral pleural effusions. There is a pericardial effusion posteriorly which was not present on the previous PET-CT measuring 2 cm in thickness on series 4, image 63. A mildly prominent node to the right of the aorta on series 4, image  76 is stable. The node anterior to the trachea on series 4, image 33 is more prominent in the interval. The pretracheal node on image 30 is unchanged. No other change in mediastinal nodes. No other adenopathy elsewhere in the chest. The esophagus is normal. Lungs/Pleura: The central airways are unchanged and unremarkable. No pneumothorax. The known nodule just posterior to the right lower lobe bronchus measures 3.2 x 2.9 cm today versus 2.5 by 2.3 cm on the September 08, 2016 PET-CT. There is a small nodule in the left apex on series 11, image 16 not seen on the comparison PET-CT measuring 5 mm today. No other new pulmonary nodules. No overt edema. Small bilateral pleural effusions are identified with associated atelectasis. Upper Abdomen: No acute abnormality. Musculoskeletal: No chest wall abnormality. No acute or significant osseous findings. Review of the MIP images confirms the above findings. IMPRESSION: 1. No pulmonary emboli. 2. The patient's known malignancy in the medial right lower lobe posterior to the right lower lobe bronchus is larger in the interval. A pre tracheal node is more prominent in the interval. No other change within  visualized lymph nodes. 3. New pericardial effusion posteriorly only measuring 2 cm. New small pleural effusions. 4. New small nodule in the left apex. Recommend attention on follow-up. 5. **An incidental finding of potential clinical significance has been found. The ascending thoracic aorta measures 4.7 cm which is stable. Ascending thoracic aortic aneurysm. Recommend semi-annual imaging followup by CTA or MRA and referral to cardiothoracic surgery if not already obtained. This recommendation follows 2010 ACCF/AHA/AATS/ACR/ASA/SCA/SCAI/SIR/STS/SVM Guidelines for the Diagnosis and Management of Patients With Thoracic Aortic Disease. Circulation. 2010; 121: [id number]** Electronically Signed   By: Dorise Bullion III M.D   On: 01/01/2017 19:30   Dg Chest Port 1 View  Result Date: 01/01/2017 CLINICAL DATA:  Cough, shortness of breath recent lung biopsy EXAM: PORTABLE CHEST 1 VIEW COMPARISON:  09/16/2016, 09/08/2016 FINDINGS: Stable cardiomegaly and vascular congestion. Postop changes in the left axilla. No superimposed CHF, pneumonia, collapse or consolidation. No large effusion or pneumothorax. Degenerative changes of the spine. Aorta is atherosclerotic and tortuous. Trachea is midline. Bones are osteopenic. Known posterior right hilar nodule is not visualized by plain radiography. IMPRESSION: Stable cardiomegaly and vascular congestion. No superimposed acute process. Right hilar nodule posteriorly is better demonstrated by recent PET-CT and not visualized by plain radiography. Electronically Signed   By: Jerilynn Mages.  Shick M.D.   On: 01/01/2017 15:18    EKG: Independently reviewed.  Multiple EKGs performed.  Patient with ectopy noted, occasional bursts of what appear to be afib, LVH with a repoalarization abnormality - overall abnormal but does not appear to be changes of acute ischemia    Assessment/Plan Principal Problem:   Symptomatic anemia Active Problems:   HYPERTENSION, BENIGN SYSTEMIC   Chest pain    Ovarian mass, left   Dyspnea   Acute renal failure (ARF) ([facility])   Right lower lobe lung mass   Fever   Elevated troponin   Pericardial effusion   Elevated brain natriuretic peptide (BNP) level   Symptomatic anemia -This is a potentially very complicated patient with multiple issues which may be related or independent -Her Hgb was 10.1 on 1/4 and is currently 6.9 -She presented with SOB, indicating symptomatic anemia -The only thing that happened in the interim was an endobronchial ultrasound with needle aspiration of pathologically enlarged subcarinal and right pretracheal lymph nodes. -CXR and CT today do not obviously indicate where this blood may have gone, but there  is now noted a 2 cm pericardial effusion. -Patient was discussed at length with pulmonology and for she will be admitted to [facility] in SDU. -If she decompensates at all, pulmonology requests to be notified so that they can transfer her to their service; otherwise they will continue to follow as consultants. -For now, she is being transfused 1 unit PRBC as ordered in the ER. -Will trend her CBC q6h. -She is heme negative and there is no other apparent source of her bleeding at this time, so it is currently assumed to be a post-procedure complication unless another bleeding source becomes evident.   Chest pain with Pericardial Effusion -Patient has also been reportedly complaining of chest pain. -Her troponin is elevated to 0.23 - which could be NSTEMI but could also be related to demand ischemia in the setting of symptomatic anemia or to her acute renal failure (see below). -Anticoagulating her at this point would be premature and could cause fatal complications, since she may be bleeding already. -Her BNP is also elevated - 2298 (1417 on 7/11).  She does not otherwise have obvious evidence of CHF exacerbation at this time. -She also appears to have a 2 cm pericardial effusion, as above, which could be blood from the tap.   -She  is having ectopy on the telemetry - PVCs as well as intermittent possible afib. -For now, will place in SDU; monitor on telemetry; and trend troponins. -As above, PCCM is aware of the patient and her potential serious complications. -Will order Echo for tomorrow AM.  Acute Renal Failure -Creatinine 1.65 (1.05 on 7/12) -This may be due to a number of factors as already listed or may be volume deficiency or another process -For now, based on the possibility that she could have a component of heart failure, we will be somewhat judicious with her fluids - but will not diurese her.   -Will trend creatinine following blood transfusion.  Fever -Per pulmonology, this can be a reactive process to the bronchoscopy and would not necessarily warrant antibiotics. -Will trend fever curve and obtain blood cultures x 2.  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 -Regardless, she has been unwilling to agree to several treatment options offered to her and so may need to consider palliative/comfort care measures soon. -Will await biopsy results and then proceed accordingly.  Encephalopathy -It is unclear at this time why the patient is so somnolent. -She does not appear to have received any sedating medications. -It is late and this may be related. -The nurse reports that the patient was alert and appropriate earlier in the evening. -She is able to wake up and answer questions before going back to sleep. -For now, will observe without intervention unless there are further clinical changes that warrant further investigation.  DVT prophylaxis: SCDs Code Status: Full - based on prior code status Family Communication: None present Disposition Plan: To be determined Consults called: Pulmonology  Admission status: Admit - It is my clinical opinion that admission to INPATIENT is reasonable and necessary because this patient will require at  least 2 midnights in the hospital to treat this condition based on the medical complexity of the problems presented.  Given the aforementioned information, the predictability of an adverse outcome is felt to be significant.    Karmen Bongo MD [facility]  If 7PM-7AM, please contact night-coverage [email or web address] Password [email or web address]  01/02/2017, 12:22 AM

## 2017-01-01 NOTE — ED Notes (Signed)
Bed: WA21 Expected date:  Expected time:  Means of arrival:  Comments: 83 yo resp distress

## 2017-01-01 NOTE — ED Notes (Signed)
RN spoke to EDP regarding CT Angio.  Pt appears to be over parameters of kidney funtion to complete this exam.  Pt is ok to eat.  Pt  given sandwich, coffee and applesauce.

## 2017-01-01 NOTE — ED Notes (Signed)
Critical lab value called to this RN.  Hgb 6.9

## 2017-01-01 NOTE — ED Triage Notes (Signed)
Pt had a R lung biopsy yesterday.  Began feeling respiratory distress after that has worsened.  EMS hears some wheeze and rhonchi on scene and gave neb Tx x1 that Pt is finishing up now on arrival to [facility].  A&O x 4

## 2017-01-01 NOTE — ED Notes (Signed)
Hospitalist paged

## 2017-01-01 NOTE — ED Notes (Signed)
RN spoke to ED PA, continue to hold off on IV fluids until CT angio resultes

## 2017-01-01 NOTE — ED Notes (Signed)
O2 at 2L via [place] begun

## 2017-01-01 NOTE — ED Notes (Signed)
Pulmonologist paged

## 2017-01-01 NOTE — ED Provider Notes (Signed)
Medical screening examination/treatment/procedure(s) were conducted as a shared visit with non-physician practitioner(s) and myself.  I personally evaluated the patient during the encounter.   EKG Interpretation  Date/Time:  Saturday January 01 2017 14:36:56 EST Ventricular Rate:  91 PR Interval:    QRS Duration: 88 QT Interval:  358 QTC Calculation: 441 R Axis:   -48 Text Interpretation:  Sinus rhythm Left anterior fascicular block Probable anteroseptal infarct, old Borderline ST depression, lateral leads Confirmed by Boris Engelmann  MD, Keyonte Cookston ([id number]) on 01/01/2017 4:12:55 PM     The patient or shortness of breath and wheezing which became worse today. Lung biopsy yesterday. : She has wheezing and rhonchi. Given albuterol and does feel better. Patient similarly noted on her CBC of 6.9. She denies any recent blood loss. Will still obtain chest CT to rule out PE. Ordered blood transfusion and admitted to the medicine service   Lacretia Leigh, MD 01/01/17 [phone]

## 2017-01-01 NOTE — ED Provider Notes (Signed)
[place] [facility] DEPT Provider Note   CSN: [id number] Arrival date & time: 01/01/17  1423     History   Chief Complaint Chief Complaint  Patient presents with  . Shortness of Breath    HPI Valerie Downs is a 83 y.o. female.  HPI   Pt is a 83 yo female with PMH of breast cancer (s/p mastectomy 1984), HTN, CHF, aortic insufficiency and mitral regurgitation who presents to the ED via EMS from home with complaint of SOB, onset this morning. Pt reports she had left lung biopsy performed yesterday and denies any complications s/p procedure but notes when she woke up at 8am she had worsening SOB. She also notes she has had a productive cough ("cream sputum") for the past 3 days. Pt also reports she has been having intermittent midsternal CP for the past 3 days that typically occurs at night while she is laying in bed. She reports having heavy pressure sensation that only lasts a few seconds and then resolves spontaneously. Denies fever, chills, HA, rhinorrhea, hemoptysis, wheezing, palpitations, abdominal pain, N/V/D, urinary sxs, leg swelling. Pt reports smoking 2-3 cigarettes daily.   Chart review shows pt was seen yesterday by Dr. Lake Bells due to PET/CT scan showing mediastinal lymphadenopathy and right lower lobe pulmonary nodule which were both metabolically active; she also had ovarian mass which was metabolically active. Endobronchial US-guided needle aspiration of mediastinal lymphadenopathy performed.  Past Medical History:  Diagnosis Date  . Aortic insufficiency   . Blood transfusion without reported diagnosis   . Breast cancer ([place])   . Cardiomyopathy ([place])   . CHF (congestive heart failure) ([place])   . Hypertension   . Mitral regurgitation     Patient Active Problem List   Diagnosis Date Noted  . Anemia 01/01/2017  . Medicare annual wellness visit, subsequent 11/04/2016  . Routine general medical examination at a health care facility 11/04/2016  . Cigarette smoker 09/17/2016    . Dyspnea 09/17/2016  . Solitary pulmonary nodule 09/16/2016  . Ovarian mass, left 07/07/2016  . Chest pain 07/06/2016  . AKI (acute kidney injury) ([place]) 08/16/2014  . GI bleeding 08/15/2014  . Rectal bleed 08/15/2014  . Heme positive stool 06/22/2014  . Symptomatic anemia 06/21/2014  . Iron deficiency anemia 06/21/2014  . Chronic systolic CHF (congestive heart failure) ([place]) 06/21/2014  . GI bleed 06/21/2014  . Acute systolic CHF (congestive heart failure) ([place]) 07/29/2013  . Malnutrition of moderate degree ([place]) 07/28/2013  . UTI (lower urinary tract infection) 07/26/2013  . Acute decompensated heart failure ([place]) 07/26/2013  . HYPERTENSION, BENIGN SYSTEMIC 02/23/2007    Past Surgical History:  Procedure Laterality Date  . COLONOSCOPY N/A 06/22/2014   Procedure: COLONOSCOPY;  Surgeon: Jerene Bears, MD;  Location: [facility] ENDOSCOPY;  Service: Endoscopy;  Laterality: N/A;  . ESOPHAGOGASTRODUODENOSCOPY N/A 06/22/2014   Procedure: ESOPHAGOGASTRODUODENOSCOPY (EGD);  Surgeon: Jerene Bears, MD;  Location: [facility];  Service: Endoscopy;  Laterality: N/A;  . MASTECTOMY Left     OB History    Gravida Para Term Preterm AB Living   10 10 10          SAB TAB Ectopic Multiple Live Births           10       Home Medications    Prior to Admission medications   Medication Sig Start Date End Date Taking? Authorizing Provider  aspirin EC 325 MG tablet Take 325 mg by mouth daily.   Yes Historical Provider, MD  ferrous gluconate (  IRON 27) 240 (27 FE) MG tablet Take 240 mg by mouth daily.   Yes Historical Provider, MD  losartan (COZAAR) 25 MG tablet Take 25 mg by mouth daily.   Yes Historical Provider, MD  metoprolol tartrate (LOPRESSOR) 25 MG tablet Take 1 tablet (25 mg total) by mouth 2 (two) times daily. 06/09/16  Yes Burtis Junes, NP  Multiple Vitamins-Minerals (CENTRUM SILVER 50+WOMEN) TABS Take 1 tablet by mouth daily.   Yes Historical Provider, MD    Family History Family History   Problem Relation Age of Onset  . Hypertension Mother   . Cancer Father     Social History Social History  Substance Use Topics  . Smoking status: Current Some Day Smoker    Packs/day: 0.25    Years: 50.00    Types: Cigarettes  . Smokeless tobacco: Former Systems developer    Types: Snuff     Comment: per patient has not used snuff in a while (10/18/16)  . Alcohol use No     Allergies   Penicillins   Review of Systems Review of Systems  Respiratory: Positive for cough and shortness of breath.   Cardiovascular: Positive for chest pain.  All other systems reviewed and are negative.    Physical Exam Updated Vital Signs BP 175/86 (BP Location: Right Arm)   Pulse 92   Temp 98.3 F (36.8 C) (Oral)   Resp 24   Ht 5\' 2"  (1.575 m)   Wt 51.7 kg   SpO2 100%   BMI 20.85 kg/m   Physical Exam  Constitutional: She is oriented to person, place, and time. She appears well-developed and well-nourished. No distress.  Frail, elderly appearing female  HENT:  Head: Normocephalic and atraumatic.  Mouth/Throat: Uvula is midline, oropharynx is clear and moist and mucous membranes are normal. No oropharyngeal exudate, posterior oropharyngeal edema, posterior oropharyngeal erythema or tonsillar abscesses. No tonsillar exudate.  Eyes: Conjunctivae and EOM are normal. Right eye exhibits no discharge. Left eye exhibits no discharge. No scleral icterus.  Neck: Normal range of motion. Neck supple.  Cardiovascular: Normal rate, regular rhythm, normal heart sounds and intact distal pulses.   Pulmonary/Chest: Effort normal. She has wheezes. She has rales. She exhibits no tenderness.  Mild rales and wheezing noted bilaterally with mild decreased breath sounds throughout. Pt with mild increased work of breathing, RR 28.   Abdominal: Soft. Bowel sounds are normal. She exhibits no distension and no mass. There is no tenderness. There is no rebound and no guarding. No hernia.  Genitourinary: Rectum normal and  vagina normal. Rectal exam shows no external hemorrhoid, no internal hemorrhoid, no fissure, no mass, no tenderness and anal tone normal.  Genitourinary Comments: No gross blood on DRE  Musculoskeletal: Normal range of motion. She exhibits no edema.  Neurological: She is alert and oriented to person, place, and time.  Skin: Skin is warm and dry. She is not diaphoretic.  Nursing note and vitals reviewed.    ED Treatments / Results  Labs (all labs ordered are listed, but only abnormal results are displayed) Labs Reviewed  CBC WITH DIFFERENTIAL/PLATELET - Abnormal; Notable for the following:       Result Value   RBC 2.52 (*)    Hemoglobin 6.9 (*)    HCT 23.0 (*)    RDW 16.9 (*)    All other components within normal limits  BASIC METABOLIC PANEL - Abnormal; Notable for the following:    Potassium 5.3 (*)    Glucose, Bld 101 (*)  BUN 32 (*)    Creatinine, Ser 1.65 (*)    GFR calc non Af Amer 27 (*)    GFR calc Af Amer 32 (*)    All other components within normal limits  BRAIN NATRIURETIC PEPTIDE - Abnormal; Notable for the following:    B Natriuretic Peptide 2,298.0 (*)    All other components within normal limits  TROPONIN I - Abnormal; Notable for the following:    Troponin I 0.23 (*)    All other components within normal limits  URINALYSIS, ROUTINE W REFLEX MICROSCOPIC - Abnormal; Notable for the following:    Protein, ur 100 (*)    Leukocytes, UA TRACE (*)    All other components within normal limits  URINALYSIS, MICROSCOPIC (REFLEX) - Abnormal; Notable for the following:    Bacteria, UA FEW (*)    Squamous Epithelial / LPF 0-5 (*)    All other components within normal limits  URINE CULTURE  I-STAT CG4 LACTIC ACID, ED  POC OCCULT BLOOD, ED  TYPE AND SCREEN  PREPARE RBC (CROSSMATCH)    EKG  EKG Interpretation  Date/Time:  Saturday January 01 2017 20:36:25 EST Ventricular Rate:  80 PR Interval:    QRS Duration: 88 QT Interval:  380 QTC Calculation: 439 R  Axis:   -18 Text Interpretation:  Sinus rhythm Multiple premature complexes, vent & supraven Probable LVH with secondary repol abnrm Anterior Q waves, possibly due to LVH Confirmed by Hazle Coca [id number]) on 01/01/2017 9:17:41 PM       Radiology Ct Angio Chest Pe W And/or Wo Contrast  Result Date: 01/01/2017 CLINICAL DATA:  Right lung biopsy yesterday.  Respiratory distress. EXAM: CT ANGIOGRAPHY CHEST WITH CONTRAST TECHNIQUE: Multidetector CT imaging of the chest was performed using the standard protocol during bolus administration of intravenous contrast. Multiplanar CT image reconstructions and MIPs were obtained to evaluate the vascular anatomy. CONTRAST:  80 cc of Isovue 370 COMPARISON:  None. FINDINGS: Cardiovascular: Cardiomegaly is identified. Coronary artery calcifications are noted. The ascending thoracic aorta measures 4.7 cm in AP dimension on series 4, image 46 unchanged in the interval. No dissection. No pulmonary emboli. Mediastinum/Nodes: Small bilateral pleural effusions. There is a pericardial effusion posteriorly which was not present on the previous PET-CT measuring 2 cm in thickness on series 4, image 63. A mildly prominent node to the right of the aorta on series 4, image 76 is stable. The node anterior to the trachea on series 4, image 33 is more prominent in the interval. The pretracheal node on image 30 is unchanged. No other change in mediastinal nodes. No other adenopathy elsewhere in the chest. The esophagus is normal. Lungs/Pleura: The central airways are unchanged and unremarkable. No pneumothorax. The known nodule just posterior to the right lower lobe bronchus measures 3.2 x 2.9 cm today versus 2.5 by 2.3 cm on the September 08, 2016 PET-CT. There is a small nodule in the left apex on series 11, image 16 not seen on the comparison PET-CT measuring 5 mm today. No other new pulmonary nodules. No overt edema. Small bilateral pleural effusions are identified with associated atelectasis.  Upper Abdomen: No acute abnormality. Musculoskeletal: No chest wall abnormality. No acute or significant osseous findings. Review of the MIP images confirms the above findings. IMPRESSION: 1. No pulmonary emboli. 2. The patient's known malignancy in the medial right lower lobe posterior to the right lower lobe bronchus is larger in the interval. A pre tracheal node is more prominent in the interval. No other  change within visualized lymph nodes. 3. New pericardial effusion posteriorly only measuring 2 cm. New small pleural effusions. 4. New small nodule in the left apex. Recommend attention on follow-up. 5. **An incidental finding of potential clinical significance has been found. The ascending thoracic aorta measures 4.7 cm which is stable. Ascending thoracic aortic aneurysm. Recommend semi-annual imaging followup by CTA or MRA and referral to cardiothoracic surgery if not already obtained. This recommendation follows 2010 ACCF/AHA/AATS/ACR/ASA/SCA/SCAI/SIR/STS/SVM Guidelines for the Diagnosis and Management of Patients With Thoracic Aortic Disease. Circulation. 2010; 121: [id number]** Electronically Signed   By: Dorise Bullion III M.D   On: 01/01/2017 19:30   Dg Chest Port 1 View  Result Date: 01/01/2017 CLINICAL DATA:  Cough, shortness of breath recent lung biopsy EXAM: PORTABLE CHEST 1 VIEW COMPARISON:  09/16/2016, 09/08/2016 FINDINGS: Stable cardiomegaly and vascular congestion. Postop changes in the left axilla. No superimposed CHF, pneumonia, collapse or consolidation. No large effusion or pneumothorax. Degenerative changes of the spine. Aorta is atherosclerotic and tortuous. Trachea is midline. Bones are osteopenic. Known posterior right hilar nodule is not visualized by plain radiography. IMPRESSION: Stable cardiomegaly and vascular congestion. No superimposed acute process. Right hilar nodule posteriorly is better demonstrated by recent PET-CT and not visualized by plain radiography. Electronically  Signed   By: Jerilynn Mages.  Shick M.D.   On: 01/01/2017 15:18    Procedures Procedures (including critical care time)  Medications Ordered in ED Medications  0.9 %  sodium chloride infusion ( Intravenous New Bag/Given 01/01/17 2323)  0.9 %  sodium chloride infusion ( Intravenous Not Given 01/01/17 1645)  ipratropium-albuterol (DUONEB) 0.5-2.5 (3) MG/3ML nebulizer solution 3 mL (3 mLs Nebulization Given 01/01/17 1532)  acetaminophen (TYLENOL) tablet 1,000 mg (1,000 mg Oral Given 01/01/17 1556)  iopamidol (ISOVUE-370) 76 % injection (100 mLs Intravenous Contrast Given 01/01/17 1853)     Initial Impression / Assessment and Plan / ED Course  I have reviewed the triage vital signs and the nursing notes.  Pertinent labs & imaging results that were available during my care of the patient were reviewed by me and considered in my medical decision making (see chart for details).  Clinical Course    Pt presents from home with worsening SOB that started this morning. Reports having a lung biopsy performed yesterday. Initial vitals showed rectal temp 102.4, remaining vitals stable. Exam revealed frail, elderly appearing female. Mild rales and wheezing noted bilaterally with mild decreased breath sounds throughout. Pt with mild increased work of breathing, RR 28. Pt given albuterol neb and reports improvement of SOB. Hgb 6.9. Trop 0.23. BNP 2,298. CXR showed stable cardiomegaly and vascular congestion, no superimposed acute process. EKG showed sinus rhythm with no acute ischemic changes noted.    Discussed case with Dr. Zenia Resides who evaluated pt in the ED. Plan to order CTA for evaluation of PE. PT denies any melena, hemoptysis, hematuria or hematemesis. Orders placed for blood transfusion. Hemoccult negative. BUN 32. Cr 1.65. Due to worsening renal function CTA canceled. Consulted hospitalist, Dr. Lorin Mercy reported that pt needed to have CT imaging performed for further evaluation and then asked to consult pulmonology. I was then  notified by radiology that with pt's GFR, she was able to have a reduced dose of contrast for CTA.   CT showed no PE. Known malignancy in the medial right lower lobe posterior to the right lower lobe bronchus is larger in the interval. A pre tracheal node is more prominent in the interval.  New pericardial effusion posteriorly only measuring 2  cm. New small pleural effusions. New small nodule in the left apex, recommend attention on follow-up. The ascending thoracic aorta measures 4.7 cm which is stable. Ascending thoracic aortic aneurysm. Recommend semi-annual imaging followup by CTA or MRA and referral to cardiothoracic surgery if not already obtained. [facility]ology, recommend admitting to medicine and state they will consult pt during her admission. Consulted hospitalist, Dr. Lorin Mercy agrees to admission. Orders placed for telemetry med. Discussed results and plan for admission with pt.   Final Clinical Impressions(s) / ED Diagnoses   Final diagnoses:  SOB (shortness of breath)  Anemia, unspecified type    New Prescriptions New Prescriptions   No medications on file     Nona Dell, PA-C 01/02/17 0001

## 2017-01-01 NOTE — ED Notes (Signed)
Attempted lab draw x1 unsuccessful

## 2017-01-02 ENCOUNTER — Inpatient Hospital Stay (HOSPITAL_COMMUNITY): Payer: Medicare Other

## 2017-01-02 DIAGNOSIS — N179 Acute kidney failure, unspecified: Secondary | ICD-10-CM | POA: Diagnosis present

## 2017-01-02 DIAGNOSIS — R918 Other nonspecific abnormal finding of lung field: Secondary | ICD-10-CM | POA: Diagnosis present

## 2017-01-02 DIAGNOSIS — I319 Disease of pericardium, unspecified: Secondary | ICD-10-CM

## 2017-01-02 DIAGNOSIS — I3139 Other pericardial effusion (noninflammatory): Secondary | ICD-10-CM | POA: Diagnosis present

## 2017-01-02 DIAGNOSIS — R509 Fever, unspecified: Secondary | ICD-10-CM | POA: Diagnosis present

## 2017-01-02 DIAGNOSIS — R778 Other specified abnormalities of plasma proteins: Secondary | ICD-10-CM | POA: Diagnosis present

## 2017-01-02 DIAGNOSIS — R079 Chest pain, unspecified: Secondary | ICD-10-CM

## 2017-01-02 DIAGNOSIS — R5082 Postprocedural fever: Secondary | ICD-10-CM

## 2017-01-02 DIAGNOSIS — R7989 Other specified abnormal findings of blood chemistry: Secondary | ICD-10-CM | POA: Diagnosis present

## 2017-01-02 DIAGNOSIS — I313 Pericardial effusion (noninflammatory): Secondary | ICD-10-CM | POA: Diagnosis present

## 2017-01-02 LAB — CBC
HCT: 29.2 % — ABNORMAL LOW (ref 36.0–46.0)
HEMATOCRIT: 30 % — AB (ref 36.0–46.0)
HEMOGLOBIN: 9 g/dL — AB (ref 12.0–15.0)
Hemoglobin: 9 g/dL — ABNORMAL LOW (ref 12.0–15.0)
MCH: 27.7 pg (ref 26.0–34.0)
MCH: 26.9 pg (ref 26.0–34.0)
MCHC: 30.8 g/dL (ref 30.0–36.0)
MCHC: 30 g/dL (ref 30.0–36.0)
MCV: 89.8 fL (ref 78.0–100.0)
MCV: 89.6 fL (ref 78.0–100.0)
Platelets: 219 10*3/uL (ref 150–400)
Platelets: 220 10*3/uL (ref 150–400)
RBC: 3.25 MIL/uL — AB (ref 3.87–5.11)
RBC: 3.35 MIL/uL — AB (ref 3.87–5.11)
RDW: 18.5 % — ABNORMAL HIGH (ref 11.5–15.5)
RDW: 18 % — ABNORMAL HIGH (ref 11.5–15.5)
WBC: 8 10*3/uL (ref 4.0–10.5)
WBC: 7.9 10*3/uL (ref 4.0–10.5)

## 2017-01-02 LAB — BASIC METABOLIC PANEL
ANION GAP: 7 (ref 5–15)
BUN: 32 mg/dL — ABNORMAL HIGH (ref 6–20)
CHLORIDE: 111 mmol/L (ref 101–111)
CO2: 22 mmol/L (ref 22–32)
Calcium: 9.4 mg/dL (ref 8.9–10.3)
Creatinine, Ser: 1.47 mg/dL — ABNORMAL HIGH (ref 0.44–1.00)
GFR calc Af Amer: 37 mL/min — ABNORMAL LOW (ref >60)
GFR, EST NON AFRICAN AMERICAN: 32 mL/min — AB (ref >60)
GLUCOSE: 121 mg/dL — AB (ref 65–99)
POTASSIUM: 5.8 mmol/L — AB (ref 3.5–5.1)
Sodium: 140 mmol/L (ref 135–145)

## 2017-01-02 LAB — MRSA PCR SCREENING: MRSA BY PCR: NEGATIVE

## 2017-01-02 LAB — ECHOCARDIOGRAM COMPLETE
Height: 62 in
Weight: 1971.79 oz

## 2017-01-02 LAB — TROPONIN I: Troponin I: 0.22 ng/mL (ref <0.03)

## 2017-01-02 MED ORDER — HYDRALAZINE HCL 20 MG/ML IJ SOLN
10.0000 mg | INTRAMUSCULAR | Status: DC | PRN
  Administered 2017-01-02 – 2017-01-03 (×2): 20 mg via INTRAVENOUS
  Administered 2017-01-03: 10 mg via INTRAVENOUS
  Filled 2017-01-02 (×3): qty 1
  Filled 2017-01-02: qty 2

## 2017-01-02 MED ORDER — SODIUM CHLORIDE 0.9% FLUSH
3.0000 mL | Freq: Two times a day (BID) | INTRAVENOUS | Status: DC
  Administered 2017-01-02 – 2017-01-05 (×5): 3 mL via INTRAVENOUS

## 2017-01-02 MED ORDER — ACETAMINOPHEN 325 MG PO TABS
650.0000 mg | ORAL_TABLET | Freq: Four times a day (QID) | ORAL | Status: DC | PRN
Start: 2017-01-02 — End: 2017-01-06

## 2017-01-02 MED ORDER — METOPROLOL TARTRATE 25 MG PO TABS
25.0000 mg | ORAL_TABLET | Freq: Two times a day (BID) | ORAL | Status: DC
  Administered 2017-01-02 – 2017-01-06 (×9): 25 mg via ORAL
  Filled 2017-01-02 (×9): qty 1

## 2017-01-02 MED ORDER — ONDANSETRON HCL 4 MG/2ML IJ SOLN: 4.0000 mg | Freq: Four times a day (QID) | INTRAMUSCULAR | Status: DC | PRN

## 2017-01-02 MED ORDER — FERROUS GLUCONATE 324 (38 FE) MG PO TABS
324.0000 mg | ORAL_TABLET | Freq: Every day | ORAL | Status: DC
  Administered 2017-01-02 – 2017-01-06 (×5): 324 mg via ORAL
  Filled 2017-01-02 (×6): qty 1

## 2017-01-02 MED ORDER — LOSARTAN POTASSIUM 25 MG PO TABS
25.0000 mg | ORAL_TABLET | Freq: Every day | ORAL | Status: DC
  Administered 2017-01-02: 25 mg via ORAL
  Filled 2017-01-02: qty 1

## 2017-01-02 MED ORDER — ASPIRIN EC 325 MG PO TBEC
325.0000 mg | DELAYED_RELEASE_TABLET | Freq: Every day | ORAL | Status: DC
  Administered 2017-01-02 – 2017-01-06 (×5): 325 mg via ORAL
  Filled 2017-01-02 (×5): qty 1

## 2017-01-02 MED ORDER — ACETAMINOPHEN 650 MG RE SUPP: 650.0000 mg | Freq: Four times a day (QID) | RECTAL | Status: DC | PRN

## 2017-01-02 MED ORDER — ONDANSETRON HCL 4 MG PO TABS: 4.0000 mg | ORAL_TABLET | Freq: Four times a day (QID) | ORAL | Status: DC | PRN

## 2017-01-02 NOTE — Consult Note (Signed)
PULMONARY  / CRITICAL CARE MEDICINE CONSULTATION   Name: Valerie Downs MRN: [id number] DOB: [birth year]    ADMISSION DATE:  01/01/2017 CONSULTATION DATE: 01/01/17  REQUESTING CLINICIAN: Dr. Karmen Bongo PRIMARY SERVICE: [facility]  CHIEF COMPLAINT:  Dyspnea  BRIEF PATIENT DESCRIPTION: 83 y/o woman with EBUS done 1/5 now presenting with anemia and dyspnea  SIGNIFICANT EVENTS / STUDIES:  CT of chest showing small pericardial and B pleural effusions  LINES / TUBES: PIV  CULTURES: Blood 01/02/17  ANTIBIOTICS: None  HISTORY OF PRESENT ILLNESS:   Valerie Downs is an 83 y/o woman with history of breast CA as well as cardiac history who underwent an bronchoscopy with EBUS by Dr. Lake Bells yesterday for evaluation of enlarged and FDG-avid nodes seen on PET scan. Pathology is not yet available. The procedure per the documentation was without incident, but the patient developed some dyspnea today. She was also found to be anemic with a Hb less than 7, where it was greater than 10 on 1/4. She did have a fever in the ED, but was normal on re-check. She was also found to have an elevated Pro-BNP as well as troponin of 0.23. A chest CT did not reveal any intrathoracic hemorrhage, although there were some small bilateral effusions and a pericardial effusion. She denies hemoptysis.  PAST MEDICAL HISTORY :  Past Medical History:  Diagnosis Date  . Aortic insufficiency   . Blood transfusion without reported diagnosis   . Breast cancer ([place])   . Cardiomyopathy ([place])   . CHF (congestive heart failure) ([place])   . Hypertension   . Mitral regurgitation    Past Surgical History:  Procedure Laterality Date  . COLONOSCOPY N/A 06/22/2014   Procedure: COLONOSCOPY;  Surgeon: Jerene Bears, MD;  Location: [facility];  Service: Endoscopy;  Laterality: N/A;  . ESOPHAGOGASTRODUODENOSCOPY N/A 06/22/2014   Procedure: ESOPHAGOGASTRODUODENOSCOPY (EGD);  Surgeon: Jerene Bears, MD;  Location: [facility];  Service: Endoscopy;   Laterality: N/A;  . MASTECTOMY Left    Prior to Admission medications   Medication Sig Start Date End Date Taking? Authorizing Provider  aspirin EC 325 MG tablet Take 325 mg by mouth daily.   Yes Historical Provider, MD  ferrous gluconate (IRON 27) 240 (27 FE) MG tablet Take 240 mg by mouth daily.   Yes Historical Provider, MD  losartan (COZAAR) 25 MG tablet Take 25 mg by mouth daily.   Yes Historical Provider, MD  metoprolol tartrate (LOPRESSOR) 25 MG tablet Take 1 tablet (25 mg total) by mouth 2 (two) times daily. 06/09/16  Yes Burtis Junes, NP  Multiple Vitamins-Minerals (CENTRUM SILVER 50+WOMEN) TABS Take 1 tablet by mouth daily.   Yes Historical Provider, MD   Allergies  Allergen Reactions  . Penicillins Anaphylaxis and Swelling    Has patient had a PCN reaction causing immediate rash, facial/tongue/throat swelling, SOB or lightheadedness with hypotension: Yes Has patient had a PCN reaction causing severe rash involving mucus membranes or skin necrosis: Yes Has patient had a PCN reaction that required hospitalization Yes Has patient had a PCN reaction occurring within the last 10 years: No If all of the above answers are "NO", then may proceed with Cephalosporin use.     FAMILY HISTORY:  Family History  Problem Relation Age of Onset  . Hypertension Mother   . Cancer Father    SOCIAL HISTORY:  reports that she has been smoking Cigarettes.  She has a 12.50 pack-year smoking history. She has quit using smokeless tobacco. Her smokeless  tobacco use included Snuff. She reports that she does not drink alcohol or use drugs.  REVIEW OF SYSTEMS:  Patient was noted to be quite sleepy  SUBJECTIVE:   VITAL SIGNS: Temp:  [97.9 F (36.6 C)-102.4 F (39.1 C)] 98.3 F (36.8 C) (01/06 2315) Pulse Rate:  [85-97] 93 (01/07 0015) Resp:  [14-28] 26 (01/07 0016) BP: (118-178)/(56-92) 153/77 (01/07 0015) SpO2:  [88 %-100 %] 100 % (01/07 0015) Weight:  [114 lb (51.7 kg)] 114 lb (51.7 kg)  (01/06 1456) HEMODYNAMICS:   VENTILATOR SETTINGS:   INTAKE / OUTPUT: Intake/Output      01/06 0701 - 01/07 0700   Blood 340   Total Intake(mL/kg) 340 (6.6)   Net +340         PHYSICAL EXAMINATION: General:  Somnolent woman in otherwise NAD Neuro:  Asleep, but arousible. HEENT:  MMM Neck: No masses Cardiovascular:  Normal S1/S2 Lungs:  Inspiratory wheeze while lying down, disappears when sitting up. Pt states this is not new. Abdomen:  Soft Musculoskeletal:  No deformities Skin:  No rashes on visible skin  LABS:  CBC  Recent Labs Lab 12/30/16 1632 01/01/17 1558  WBC 11.5* 6.6  HGB 10.1* 6.9*  HCT 28.5* 23.0*  PLT 129.0* 217   Coag's No results for input(s): APTT, INR in the last 168 hours. BMET  Recent Labs Lab 01/01/17 1558  NA 138  K 5.3*  CL 110  CO2 23  BUN 32*  CREATININE 1.65*  GLUCOSE 101*   Electrolytes  Recent Labs Lab 01/01/17 1558  CALCIUM 9.0   Sepsis Markers  Recent Labs Lab 01/01/17 1603  LATICACIDVEN 1.28   ABG No results for input(s): PHART, PCO2ART, PO2ART in the last 168 hours. Liver Enzymes No results for input(s): AST, ALT, ALKPHOS, BILITOT, ALBUMIN in the last 168 hours. Cardiac Enzymes  Recent Labs Lab 01/01/17 1558  TROPONINI 0.23*   Glucose  Recent Labs Lab 12/31/16 1617  GLUCAP 75    Imaging Ct Angio Chest Pe W And/or Wo Contrast  Result Date: 01/01/2017 CLINICAL DATA:  Right lung biopsy yesterday.  Respiratory distress. EXAM: CT ANGIOGRAPHY CHEST WITH CONTRAST TECHNIQUE: Multidetector CT imaging of the chest was performed using the standard protocol during bolus administration of intravenous contrast. Multiplanar CT image reconstructions and MIPs were obtained to evaluate the vascular anatomy. CONTRAST:  80 cc of Isovue 370 COMPARISON:  None. FINDINGS: Cardiovascular: Cardiomegaly is identified. Coronary artery calcifications are noted. The ascending thoracic aorta measures 4.7 cm in AP dimension on  series 4, image 46 unchanged in the interval. No dissection. No pulmonary emboli. Mediastinum/Nodes: Small bilateral pleural effusions. There is a pericardial effusion posteriorly which was not present on the previous PET-CT measuring 2 cm in thickness on series 4, image 63. A mildly prominent node to the right of the aorta on series 4, image 76 is stable. The node anterior to the trachea on series 4, image 33 is more prominent in the interval. The pretracheal node on image 30 is unchanged. No other change in mediastinal nodes. No other adenopathy elsewhere in the chest. The esophagus is normal. Lungs/Pleura: The central airways are unchanged and unremarkable. No pneumothorax. The known nodule just posterior to the right lower lobe bronchus measures 3.2 x 2.9 cm today versus 2.5 by 2.3 cm on the September 08, 2016 PET-CT. There is a small nodule in the left apex on series 11, image 16 not seen on the comparison PET-CT measuring 5 mm today. No other new pulmonary nodules. No  overt edema. Small bilateral pleural effusions are identified with associated atelectasis. Upper Abdomen: No acute abnormality. Musculoskeletal: No chest wall abnormality. No acute or significant osseous findings. Review of the MIP images confirms the above findings. IMPRESSION: 1. No pulmonary emboli. 2. The patient's known malignancy in the medial right lower lobe posterior to the right lower lobe bronchus is larger in the interval. A pre tracheal node is more prominent in the interval. No other change within visualized lymph nodes. 3. New pericardial effusion posteriorly only measuring 2 cm. New small pleural effusions. 4. New small nodule in the left apex. Recommend attention on follow-up. 5. **An incidental finding of potential clinical significance has been found. The ascending thoracic aorta measures 4.7 cm which is stable. Ascending thoracic aortic aneurysm. Recommend semi-annual imaging followup by CTA or MRA and referral to  cardiothoracic surgery if not already obtained. This recommendation follows 2010 ACCF/AHA/AATS/ACR/ASA/SCA/SCAI/SIR/STS/SVM Guidelines for the Diagnosis and Management of Patients With Thoracic Aortic Disease. Circulation. 2010; 121: [id number]** Electronically Signed   By: Dorise Bullion III M.D   On: 01/01/2017 19:30   Dg Chest Port 1 View  Result Date: 01/01/2017 CLINICAL DATA:  Cough, shortness of breath recent lung biopsy EXAM: PORTABLE CHEST 1 VIEW COMPARISON:  09/16/2016, 09/08/2016 FINDINGS: Stable cardiomegaly and vascular congestion. Postop changes in the left axilla. No superimposed CHF, pneumonia, collapse or consolidation. No large effusion or pneumothorax. Degenerative changes of the spine. Aorta is atherosclerotic and tortuous. Trachea is midline. Bones are osteopenic. Known posterior right hilar nodule is not visualized by plain radiography. IMPRESSION: Stable cardiomegaly and vascular congestion. No superimposed acute process. Right hilar nodule posteriorly is better demonstrated by recent PET-CT and not visualized by plain radiography. Electronically Signed   By: Jerilynn Mages.  Shick M.D.   On: 01/01/2017 15:18    EKG: NSR, with lots of ectopy Chest CT: No obvious injury noted, but small pleural and pericardial effusions  ASSESSMENT / PLAN:  1. New onset anemia: Most concerning for post-procedure hemorrhage, although without hemoptysis or convincing radiographic evidence of bleeding, this seems unlikely. Will need to be closely monitored, however. Differential includes process unrelated to procedure (GIB, MAHA, etc). Trend Hb as you are.  2. S/p Bronchoscopy with EBUS: Fever not an uncommon sequallae, although more than 24 hours after procedure is late. Cultures drawn, hold on emperic ABX in absence of other signs of infection, but low threshold to start.  3. Troponinemia with Ectopy: Likely demand processes. S/p transfusion. Do not believe this is a primary cardiac issue.  4.  Dyspnea: Dyspnea may very well be a symptom of anemia, not primary pulmonary process. However, will need to be closely monitored given symptoms and potential disease severity.   I have personally obtained a history, examined the patient, evaluated laboratory and imaging results, formulated the assessment and plan and placed orders.   Luz Brazen, MD [facility] [place] Pager: [phone]   01/02/2017, 1:41 AM

## 2017-01-02 NOTE — Progress Notes (Signed)
eLink Physician-Brief Progress Note Patient Name: Valerie Downs DOB: [birth year] MRN: [id number]   Date of Service  01/02/2017  HPI/Events of Note  Follow-up in the patient.   Patient seen, as sleep, comfortable. Heart rate 93. Respiratory rate 20-30, O2 sats 100% on 2 L.   She has received 1 unit packed red blood cells.   eICU Interventions  Continue to trend  Hb and Hct q6 Trend troponin     Intervention Category Evaluation Type: Other  [place] 01/02/2017, 3:27 AM

## 2017-01-02 NOTE — Progress Notes (Signed)
Pt. Refuses to have any blood drawn. Notified MD. Will try and get consent again in the AM.

## 2017-01-02 NOTE — Progress Notes (Signed)
  Echocardiogram 2D Echocardiogram has been performed.  Valerie Downs 01/02/2017, 10:12 AM

## 2017-01-02 NOTE — ED Notes (Signed)
Daughter (whom she lives with)- Ahmed Prima [phone] cell Son - Annabell Howells [phone] cell

## 2017-01-02 NOTE — Progress Notes (Signed)
PROGRESS NOTE    Valerie Downs  [id number] DOB: 1930-08-24 DOA: 01/01/2017 PCP: Mauricio Po, FNP   Brief Narrative:  Valerie Downs is a 83 y.o. female with medical history significant of breast cancer (s/p mastectomy 1984), HTN, CHF, aortic insufficiency and mitral regurgitation presenting to the ER. With SOB. Pt reports she had left lung biopsy performed [id number] denies any complications s/p procedure but notes when she woke up at 8am she had worsening SOB. She also notes she has had a productive cough ("cream sputum") for the past 3 days. Pt also reports she has been having intermittent midsternal CP for the past 3 days that typically occurs at night while she is laying in bed. She reports having heavy pressure sensation that only lasts a few seconds and then resolves spontaneously. Denies fever, chills, HA, rhinorrhea, hemoptysis, wheezing, palpitations, abdominal pain, N/V/D, urinary sxs, leg swelling. Pt reports smoking 2-3 cigarettes daily. CT of chest done. Admitted for Symptomatic Anemia and Chest Pain with Pericardial Effusion  Assessment & Plan:   Principal Problem:   Symptomatic anemia Active Problems:   HYPERTENSION, BENIGN SYSTEMIC   Chest pain   Ovarian mass, left   Dyspnea   Acute renal failure (ARF) ([facility])   Right lower lobe lung mass   Fever   Elevated troponin   Pericardial effusion   Elevated brain natriuretic peptide (BNP) level  Symptomatic anemia, improved -This is a potentially very complicated patient with multiple issues which may be related or independent -Her Hgb was 10.1 on 1/4 and was 6.9 on admission; S/p Transfusion Hb was 9.0 -She presented with SOB, indicating symptomatic anemia; Concerning for post-procedure hemorrhage s/p Bronchoscopy and EBUS -The only thing that happened in the interim was an endobronchial ultrasound with needle aspiration of pathologically enlarged subcarinal and right pretracheal lymph nodes. -CXR and CT yesterday do not  obviously indicate where this blood may have gone, but there is now noted a 2 cm pericardial effusion. -Admitted to SDU; PCCM Following  -For now, she is being transfused 1 unit PRBC as ordered in the ER. -Will trend her CBC q6h. -She is heme negative and there is no other apparent source of her bleeding at this time, so it is currently assumed to be a post-procedure complication unless another bleeding source becomes evident.  -Continue to trend CBC q6h and continue with Ferrous gluconate 324 mg po Daily  Chest pain with Pericardial Effusion; unlikely Cardiac But likely Demand -Patient has also been reportedly complaining of chest pain. -Her troponin is elevated to 0.23 - likely elated to demand ischemia in the setting of symptomatic anemia or to her acute renal failure (see below). --Her BNP is also elevated - 2298 (1417 on 7/11).  She does not otherwise have obvious evidence of CHF exacerbation at this time. -She also appears to have a 2 cm pericardial effusion, as above, which could be blood from the tap.   -She is having ectopy on the telemetry - PVCs as well as intermittent possible afib. -As above, PCCM is aware of the patient and her potential serious complications. -ECHO done and pending read -May need Pericardiocentesis  Acute Renal Failure; improving  -Creatinine 1.65 (1.05 on 7/12); Cr now 1.47; C/w IVF Rehydration -Repeat CMP in AM  Hyperkalemia -Patient's K+ was 5.8;  -Losartan Discontinued; C/w IVF at 50 mL/hr -Repeat CMP in AM  Fever -Per pulmonology, this can be a reactive process to the bronchoscopy and would not necessarily warrant antibiotics. -C/w Acetaminophen 650 mg po  q6hprn -Will trend fever curve and obtain blood cultures x 2.  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 -Regardless, she has been unwilling to agree to several treatment options offered to her and so may need to consider  palliative/comfort care measures soon. -Will await biopsy results and then proceed accordingly.  DVT prophylaxis: SCDs Code Status: FULL Family Communication: Discussed with Daughter over the phone  Disposition Plan: Monitor in SDU and possible Transfer to floor in AM  Consultants:   PCCM  Procedures: s/p Bronchoscopy and EBUS  Antimicrobials: None  Subjective: Seen and examined at bedside and was a little sleepy this Am. Refused blood sticks and was annoyed. Wanted to speak with Daughter. Spoke with daughter over the phone and stated this was her mothers baseline. No N/V. No hematemesis and no mention of dark stools. No other concerns or complaints at this time.   Objective: Vitals:   01/02/17 1200 01/02/17 1600 01/02/17 1900 01/02/17 2000  BP: (!) 166/72 (!) 178/71  (!) 165/61  Pulse: 73 81 88 89  Resp: 20 19 (!) 27 (!) 31  Temp: 97.6 F (36.4 C)   98 F (36.7 C)  TempSrc: Oral   Oral  SpO2: 100% 100% 100% 100%  Weight:      Height:        Intake/Output Summary (Last 24 hours) at 01/02/17 2144 Last data filed at 01/02/17 2000  Gross per 24 hour  Intake           837.99 ml  Output                1 ml  Net           836.99 ml   Filed Weights   01/01/17 1456 01/02/17 0105  Weight: 51.7 kg (114 lb) 55.9 kg (123 lb 3.8 oz)   Examination: Physical Exam:  Constitutional: Thin elderly AAF appears calm and comfortable resting in bed Eyes:  Lids and conjunctivae normal, sclerae anicteric  ENMT: External Ears, Nose appear normal. Grossly normal hearing.  Neck: Appears normal, supple, no cervical masses, normal ROM, no appreciable thyromegaly Respiratory: Clear to auscultation bilaterally, no wheezing, rales, rhonchi or crackles. Normal respiratory effort and patient is not tachypenic. No accessory muscle use.  Cardiovascular: RRR, no murmurs / rubs / gallops. S1 and S2 auscultated. No extremity edema.  Abdomen: Soft, non-tender, non-distended. No masses palpated. No  appreciable hepatosplenomegaly. Bowel sounds positive x4.  GU: Deferred. Musculoskeletal: No clubbing / cyanosis of digits/nails. No joint deformity upper and lower extremities.  Skin: No rashes, lesions, ulcers. No induration; Warm and dry.  Neurologic: CN 2-12 grossly intact with no focal deficits. Sensation intact in all 4 Extremities. Romberg sign cerebellar reflexes not assessed.  Psychiatric: Normal judgment and insight. Alert but a little sleepy. Knows she is in the hospital but cannot remember what name.  Normal mood and Flat affect.   Data Reviewed: I have personally reviewed following labs and imaging studies  CBC:  Recent Labs Lab 12/30/16 1632 01/01/17 1558 01/02/17 0355 01/02/17 1820  WBC 11.5* 6.6 8.0 7.9  NEUTROABS 7.4 3.6  --   --   HGB 10.1* 6.9* 9.0* 9.0*  HCT 28.5* 23.0* 29.2* 30.0*  MCV 87.9 91.3 89.8 89.6  PLT 129.0* 217 219 [id number]   Basic Metabolic Panel:  Recent Labs Lab 01/01/17 1558 01/02/17 0355  NA 138 140  K 5.3* 5.8*  CL 110 111  CO2 23 22  GLUCOSE 101* 121*  BUN 32* 32*  CREATININE 1.65* 1.47*  CALCIUM 9.0 9.4   GFR: Estimated Creatinine Clearance: 22.5 mL/min (by C-G formula based on SCr of 1.47 mg/dL (H)). Liver Function Tests: No results for input(s): AST, ALT, ALKPHOS, BILITOT, PROT, ALBUMIN in the last 168 hours. No results for input(s): LIPASE, AMYLASE in the last 168 hours. No results for input(s): AMMONIA in the last 168 hours. Coagulation Profile: No results for input(s): INR, PROTIME in the last 168 hours. Cardiac Enzymes:  Recent Labs Lab 01/01/17 1558 01/02/17 0355  TROPONINI 0.23* 0.22*   BNP (last 3 results) No results for input(s): PROBNP in the last 8760 hours. HbA1C: No results for input(s): HGBA1C in the last 72 hours. CBG:  Recent Labs Lab 12/31/16 1617  GLUCAP 75   Lipid Profile: No results for input(s): CHOL, HDL, LDLCALC, TRIG, CHOLHDL, LDLDIRECT in the last 72 hours. Thyroid Function Tests: No  results for input(s): TSH, T4TOTAL, FREET4, T3FREE, THYROIDAB in the last 72 hours. Anemia Panel: No results for input(s): VITAMINB12, FOLATE, FERRITIN, TIBC, IRON, RETICCTPCT in the last 72 hours. Sepsis Labs:  Recent Labs Lab 01/01/17 1603  LATICACIDVEN 1.28    Recent Results (from the past 240 hour(s))  MRSA PCR Screening     Status: None   Collection Time: 01/02/17 12:52 AM  Result Value Ref Range Status   MRSA by PCR NEGATIVE NEGATIVE Final    Comment:        The GeneXpert MRSA Assay (FDA approved for NASAL specimens only), is one component of a comprehensive MRSA colonization surveillance program. It is not intended to diagnose MRSA infection nor to guide or monitor treatment for MRSA infections.     Radiology Studies: Ct Angio Chest Pe W And/or Wo Contrast  Result Date: 01/01/2017 CLINICAL DATA:  Right lung biopsy yesterday.  Respiratory distress. EXAM: CT ANGIOGRAPHY CHEST WITH CONTRAST TECHNIQUE: Multidetector CT imaging of the chest was performed using the standard protocol during bolus administration of intravenous contrast. Multiplanar CT image reconstructions and MIPs were obtained to evaluate the vascular anatomy. CONTRAST:  80 cc of Isovue 370 COMPARISON:  None. FINDINGS: Cardiovascular: Cardiomegaly is identified. Coronary artery calcifications are noted. The ascending thoracic aorta measures 4.7 cm in AP dimension on series 4, image 46 unchanged in the interval. No dissection. No pulmonary emboli. Mediastinum/Nodes: Small bilateral pleural effusions. There is a pericardial effusion posteriorly which was not present on the previous PET-CT measuring 2 cm in thickness on series 4, image 63. A mildly prominent node to the right of the aorta on series 4, image 76 is stable. The node anterior to the trachea on series 4, image 33 is more prominent in the interval. The pretracheal node on image 30 is unchanged. No other change in mediastinal nodes. No other adenopathy elsewhere  in the chest. The esophagus is normal. Lungs/Pleura: The central airways are unchanged and unremarkable. No pneumothorax. The known nodule just posterior to the right lower lobe bronchus measures 3.2 x 2.9 cm today versus 2.5 by 2.3 cm on the September 08, 2016 PET-CT. There is a small nodule in the left apex on series 11, image 16 not seen on the comparison PET-CT measuring 5 mm today. No other new pulmonary nodules. No overt edema. Small bilateral pleural effusions are identified with associated atelectasis. Upper Abdomen: No acute abnormality. Musculoskeletal: No chest wall abnormality. No acute or significant osseous findings. Review of the MIP images confirms the above findings. IMPRESSION: 1. No  pulmonary emboli. 2. The patient's known malignancy in the medial right lower lobe posterior to the right lower lobe bronchus is larger in the interval. A pre tracheal node is more prominent in the interval. No other change within visualized lymph nodes. 3. New pericardial effusion posteriorly only measuring 2 cm. New small pleural effusions. 4. New small nodule in the left apex. Recommend attention on follow-up. 5. **An incidental finding of potential clinical significance has been found. The ascending thoracic aorta measures 4.7 cm which is stable. Ascending thoracic aortic aneurysm. Recommend semi-annual imaging followup by CTA or MRA and referral to cardiothoracic surgery if not already obtained. This recommendation follows 2010 ACCF/AHA/AATS/ACR/ASA/SCA/SCAI/SIR/STS/SVM Guidelines for the Diagnosis and Management of Patients With Thoracic Aortic Disease. Circulation. 2010; 121: [id number]** Electronically Signed   By: Dorise Bullion III M.D   On: 01/01/2017 19:30   Dg Chest Port 1 View  Result Date: 01/01/2017 CLINICAL DATA:  Cough, shortness of breath recent lung biopsy EXAM: PORTABLE CHEST 1 VIEW COMPARISON:  09/16/2016, 09/08/2016 FINDINGS: Stable cardiomegaly and vascular congestion. Postop changes in the  left axilla. No superimposed CHF, pneumonia, collapse or consolidation. No large effusion or pneumothorax. Degenerative changes of the spine. Aorta is atherosclerotic and tortuous. Trachea is midline. Bones are osteopenic. Known posterior right hilar nodule is not visualized by plain radiography. IMPRESSION: Stable cardiomegaly and vascular congestion. No superimposed acute process. Right hilar nodule posteriorly is better demonstrated by recent PET-CT and not visualized by plain radiography. Electronically Signed   By: Jerilynn Mages.  Shick M.D.   On: 01/01/2017 15:18   Scheduled Meds: . sodium chloride   Intravenous Once  . aspirin EC  325 mg Oral Daily  . ferrous gluconate  324 mg Oral Daily  . metoprolol tartrate  25 mg Oral BID  . sodium chloride flush  3 mL Intravenous Q12H   Continuous Infusions: . sodium chloride 50 mL/hr at 01/02/17 2000     LOS: 1 day   Kerney Elbe, DO [facility] Pager [phone]  If 7PM-7AM, please contact night-coverage [email or web address] Password [facility] 01/02/2017, 9:44 PM

## 2017-01-02 NOTE — Consult Note (Addendum)
PULMONARY  / CRITICAL CARE MEDICINE CONSULTATION   Name: Valerie Downs MRN: [id number] DOB: Apr 11, 1930    ADMISSION DATE:  01/01/2017 CONSULTATION DATE: 01/01/17  REQUESTING CLINICIAN: Dr. Karmen Bongo PRIMARY SERVICE: [facility]  CHIEF COMPLAINT:  Dyspnea  BRIEF PATIENT DESCRIPTION: 83 y/o woman with EBUS done 1/5 now presenting with anemia and dyspnea  SIGNIFICANT EVENTS / STUDIES:  CT of chest showing small pericardial and B pleural effusions  LINES / TUBES: PIV  CULTURES: Blood 01/02/17  ANTIBIOTICS: None  HISTORY OF PRESENT ILLNESS:   Ms. Valerie Downs is an 83 y/o woman with history of breast CA, ovarian mass  as well as cardiac history who underwent an bronchoscopy with EBUS by Dr. Lake Bells yesterday for evaluation of enlarged and FDG-avid nodes seen on PET scan. Pathology is not yet available. The procedure per the documentation was without incident, but the patient developed some dyspnea today. She was also found to be anemic with a Hb less than 7, where it was greater than 10 on 1/4. She did have a fever in the ED, but was normal on re-check. She was also found to have an elevated Pro-BNP as well as troponin of 0.23. A chest CT did not reveal any intrathoracic hemorrhage, although there were some small bilateral effusions and a pericardial effusion. She denies hemoptysis.   SUBJECTIVE: lethargic No dyspnea or CP  VITAL SIGNS: Temp:  [97.9 F (36.6 C)-102.4 F (39.1 C)] 98.2 F (36.8 C) (01/07 0739) Pulse Rate:  [85-97] 87 (01/07 0800) Resp:  [14-30] 23 (01/07 0800) BP: (118-188)/(56-92) 184/72 (01/07 0800) SpO2:  [88 %-100 %] 91 % (01/07 0800) Weight:  [114 lb (51.7 kg)-123 lb 3.8 oz (55.9 kg)] 123 lb 3.8 oz (55.9 kg) (01/07 0105) HEMODYNAMICS:   VENTILATOR SETTINGS:   INTAKE / OUTPUT: Intake/Output      01/06 0701 - 01/07 0700 01/07 0701 - 01/08 0700   I.V. (mL/kg) 132.3 (2.4) 27.3 (0.5)   Blood 340    Total Intake(mL/kg) 472.3 (8.4) 27.3 (0.5)   Urine (mL/kg/hr)  1 (0)    Total Output   1   Net +472.3 +26.3          PHYSICAL EXAMINATION: General:  Somnolent woman in otherwise NAD Neuro:  arousable & appropriate , A/o x 3, non focal HEENT:  MMM Neck: No masses Cardiovascular:  Normal S1/S2 Lungs:  Inspiratory wheeze while lying down, disappears when sitting up. Pt states this is not new. Abdomen:  Soft Musculoskeletal:  No deformities Skin:  No rashes on visible skin  LABS:  CBC  Recent Labs Lab 12/30/16 1632 01/01/17 1558 01/02/17 0355  WBC 11.5* 6.6 8.0  HGB 10.1* 6.9* 9.0*  HCT 28.5* 23.0* 29.2*  PLT 129.0* 217 219   Coag's No results for input(s): APTT, INR in the last 168 hours. BMET  Recent Labs Lab 01/01/17 1558 01/02/17 0355  NA 138 140  K 5.3* 5.8*  CL 110 111  CO2 23 22  BUN 32* 32*  CREATININE 1.65* 1.47*  GLUCOSE 101* 121*   Electrolytes  Recent Labs Lab 01/01/17 1558 01/02/17 0355  CALCIUM 9.0 9.4   Sepsis Markers  Recent Labs Lab 01/01/17 1603  LATICACIDVEN 1.28   ABG No results for input(s): PHART, PCO2ART, PO2ART in the last 168 hours. Liver Enzymes No results for input(s): AST, ALT, ALKPHOS, BILITOT, ALBUMIN in the last 168 hours. Cardiac Enzymes  Recent Labs Lab 01/01/17 1558 01/02/17 0355  TROPONINI 0.23* 0.22*   Glucose  Recent Labs Lab  12/31/16 1617  GLUCAP 75    Imaging Ct Angio Chest Pe W And/or Wo Contrast  Result Date: 01/01/2017 CLINICAL DATA:  Right lung biopsy yesterday.  Respiratory distress. EXAM: CT ANGIOGRAPHY CHEST WITH CONTRAST TECHNIQUE: Multidetector CT imaging of the chest was performed using the standard protocol during bolus administration of intravenous contrast. Multiplanar CT image reconstructions and MIPs were obtained to evaluate the vascular anatomy. CONTRAST:  80 cc of Isovue 370 COMPARISON:  None. FINDINGS: Cardiovascular: Cardiomegaly is identified. Coronary artery calcifications are noted. The ascending thoracic aorta measures 4.7 cm in AP dimension on  series 4, image 46 unchanged in the interval. No dissection. No pulmonary emboli. Mediastinum/Nodes: Small bilateral pleural effusions. There is a pericardial effusion posteriorly which was not present on the previous PET-CT measuring 2 cm in thickness on series 4, image 63. A mildly prominent node to the right of the aorta on series 4, image 76 is stable. The node anterior to the trachea on series 4, image 33 is more prominent in the interval. The pretracheal node on image 30 is unchanged. No other change in mediastinal nodes. No other adenopathy elsewhere in the chest. The esophagus is normal. Lungs/Pleura: The central airways are unchanged and unremarkable. No pneumothorax. The known nodule just posterior to the right lower lobe bronchus measures 3.2 x 2.9 cm today versus 2.5 by 2.3 cm on the September 08, 2016 PET-CT. There is a small nodule in the left apex on series 11, image 16 not seen on the comparison PET-CT measuring 5 mm today. No other new pulmonary nodules. No overt edema. Small bilateral pleural effusions are identified with associated atelectasis. Upper Abdomen: No acute abnormality. Musculoskeletal: No chest wall abnormality. No acute or significant osseous findings. Review of the MIP images confirms the above findings. IMPRESSION: 1. No pulmonary emboli. 2. The patient's known malignancy in the medial right lower lobe posterior to the right lower lobe bronchus is larger in the interval. A pre tracheal node is more prominent in the interval. No other change within visualized lymph nodes. 3. New pericardial effusion posteriorly only measuring 2 cm. New small pleural effusions. 4. New small nodule in the left apex. Recommend attention on follow-up. 5. **An incidental finding of potential clinical significance has been found. The ascending thoracic aorta measures 4.7 cm which is stable. Ascending thoracic aortic aneurysm. Recommend semi-annual imaging followup by CTA or MRA and referral to  cardiothoracic surgery if not already obtained. This recommendation follows 2010 ACCF/AHA/AATS/ACR/ASA/SCA/SCAI/SIR/STS/SVM Guidelines for the Diagnosis and Management of Patients With Thoracic Aortic Disease. Circulation. 2010; 121: [id number]** Electronically Signed   By: Dorise Bullion III M.D   On: 01/01/2017 19:30   Dg Chest Port 1 View  Result Date: 01/01/2017 CLINICAL DATA:  Cough, shortness of breath recent lung biopsy EXAM: PORTABLE CHEST 1 VIEW COMPARISON:  09/16/2016, 09/08/2016 FINDINGS: Stable cardiomegaly and vascular congestion. Postop changes in the left axilla. No superimposed CHF, pneumonia, collapse or consolidation. No large effusion or pneumothorax. Degenerative changes of the spine. Aorta is atherosclerotic and tortuous. Trachea is midline. Bones are osteopenic. Known posterior right hilar nodule is not visualized by plain radiography. IMPRESSION: Stable cardiomegaly and vascular congestion. No superimposed acute process. Right hilar nodule posteriorly is better demonstrated by recent PET-CT and not visualized by plain radiography. Electronically Signed   By: Jerilynn Mages.  Shick M.D.   On: 01/01/2017 15:18    EKG: NSR, with lots of ectopy Chest CT: No obvious injury noted, but small pleural and pericardial effusions  ASSESSMENT /  PLAN:  1. New onset anemia: Most concerning for post-procedure hemorrhage, although without hemoptysis or convincing radiographic evidence of bleeding, this seems unlikely. Will need to be closely monitored, however. Differential includes process unrelated to procedure (GIB, MAHA, etc). Trend Hb -has come up appropriately after 1 U PRBC  2. S/p Bronchoscopy with EBUS: Fever not an uncommon sequallae, although more than 24 hours after procedure is late. Cultures drawn, hold on emperic ABX in absence of other signs of infection, but low threshold to start.  3. Troponin leak with Ectopy: Likely demand processes. S/p transfusion. Do not believe this is a primary  cardiac issue.  Pericardial effusion - echo, may need pericardiocentesis ? Metastatic cancer, doubt bleeding  4. AKI/ hyperkalemia - hydrate, dc losartan   Kara Mead MD. FCCP. [facility] Pager [phone] If no response call [phone]     01/02/2017, 12:03 PM

## 2017-01-03 ENCOUNTER — Encounter (HOSPITAL_COMMUNITY): Payer: Self-pay | Admitting: Pulmonary Disease

## 2017-01-03 DIAGNOSIS — R0600 Dyspnea, unspecified: Secondary | ICD-10-CM

## 2017-01-03 LAB — TYPE AND SCREEN
Blood Product Expiration Date: 201801192359
ISSUE DATE / TIME: 201801062003
UNIT TYPE AND RH: 5100

## 2017-01-03 LAB — URINE CULTURE

## 2017-01-03 NOTE — Addendum Note (Signed)
Addendum  created 01/03/17 1501 by Duane Boston, MD   SmartForm saved

## 2017-01-03 NOTE — Progress Notes (Signed)
Lab has came twice to draw labs, pt refused. Will notify Md.

## 2017-01-03 NOTE — Progress Notes (Signed)
PROGRESS NOTE    Valerie Downs  [id number] DOB: [birth year] DOA: 01/01/2017 PCP: Mauricio Po, FNP   Brief Narrative:  Valerie Downs is a 83 y.o. female with medical history significant of breast cancer (s/p mastectomy 1984), HTN, CHF, aortic insufficiency and mitral regurgitation presenting to the ER. With SOB. Pt reports she had left lung biopsy performed [id number] denies any complications s/p procedure but notes when she woke up at 8am she had worsening SOB. She also notes she has had a productive cough ("cream sputum") for the past 3 days. Pt also reports she has been having intermittent midsternal CP for the past 3 days that typically occurs at night while she is laying in bed. She reports having heavy pressure sensation that only lasts a few seconds and then resolves spontaneously. Denies fever, chills, HA, rhinorrhea, hemoptysis, wheezing, palpitations, abdominal pain, N/V/D, urinary sxs, leg swelling. Pt reports smoking 2-3 cigarettes daily. CT of chest done. Admitted for Symptomatic Anemia and Chest Pain with Pericardial Effusion. Refused to have blood draws and refusing care. Transferred from SDU to [place] with Telemetry today.   Assessment & Plan:   Principal Problem:   Symptomatic anemia Active Problems:   HYPERTENSION, BENIGN SYSTEMIC   Chest pain   Ovarian mass, left   Dyspnea   Acute renal failure (ARF) (HCC)   Right lower lobe lung mass   Fever   Elevated troponin   Pericardial effusion   Elevated brain natriuretic peptide (BNP) level  Symptomatic anemia, improved  -UNABLE to ASSESS Hb today as patient refused Blood Draws -This is a potentially very complicated patient with multiple issues which may be related or independent -Her Hgb was 10.1 on 1/4 and was 6.9 on admission; S/p Transfusion Hb was 9.0 -She presented with SOB, indicating symptomatic anemia; Concerning for post-procedure hemorrhage s/p Bronchoscopy and EBUS -The only thing that happened in the  interim was an endobronchial ultrasound with needle aspiration of pathologically enlarged subcarinal and right pretracheal lymph nodes. -CXR and CT yesterday do not obviously indicate where this blood may have gone, but there is now noted a 2 cm pericardial effusion. -Admitted to SDU and now transferred to [facility]; PCCM Following  -For now, she is being transfused 1 unit PRBC as ordered in the ER. -She is heme negative and there is no other apparent source of her bleeding at this time, so it is currently assumed to be a post-procedure complication unless another bleeding source becomes evident.  -Continue with Ferrous gluconate 324 mg po Daily -Repeat CBC in AM if Patient allows  Chest pain with Pericardial Effusion; unlikely Cardiac But likely Demand -Patient has also been reportedly complaining of chest pain. -Her troponin is elevated to 0.23 - likely elated to demand ischemia in the setting of symptomatic anemia or to her acute renal failure (see below). --Her BNP is also elevated - 2298 (1417 on 7/11).  She does not otherwise have obvious evidence of CHF exacerbation at this time. -She also appears to have a 2 cm pericardial effusion, as above, which could be blood from the tap.   -She is having ectopy on the telemetry - PVCs as well as intermittent possible afib. -As above, PCCM is aware of the patient and her potential serious complications. -ECHO done and showed Low normal LV systolic function; severe LVH; biatrial   enlargement; moderate AI; mild MR and TR; severely elevated  pulmonary pressure; small pericardial effusion  -May need Pericardiocentesis; Will discuss with Cardiology in AM  Acute Renal Failure; improving  -Creatinine 1.65 (1.05 on 7/12); Cr yesterday 1.47; D/C'd IVF Rehydration -Repeat CMP in AM  Hyperkalemia -Patient's K+ was 5.8; Unable to assess what it was today as patient refused -Losartan Discontinued; D/C'd IVF at 50 mL/hr -Repeat CMP in AM  Fever -Per  pulmonology, this can be a reactive process to the bronchoscopy and would not necessarily warrant antibiotics. 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 -Regardless, she has been unwilling to agree to several treatment options offered to her and so may need to consider palliative/comfort care measures soon. -Will await biopsy results and then proceed accordingly.   DVT prophylaxis: SCDs Code Status: FULL Family Communication: Discussed with Daughter over the phone and told her about mother refusing care.  Disposition Plan: Transfer to [facility]  Consultants:   PCCM  Procedures: s/p Bronchoscopy and EBUS  Antimicrobials: None  Subjective: Seen and examined at bedside and did not really want to interact. Stated she did not want to have blood draws and wanted to be left alone. No N/V and states she just wanted to rest because her back was hurting from coughing. No other complaints or concerns.   Objective: Vitals:   01/03/17 1200 01/03/17 1300 01/03/17 1400 01/03/17 1500  BP: (!) 145/60  (!) 156/73   Pulse: 80 79 79 82  Resp: (!) 31 19 20  (!) 25  Temp: 98.1 F (36.7 C)     TempSrc: Oral     SpO2: 95% 92% 93% 94%  Weight:      Height:        Intake/Output Summary (Last 24 hours) at 01/03/17 1655 Last data filed at 01/03/17 1500  Gross per 24 hour  Intake          1288.33 ml  Output                0 ml  Net          1288.33 ml   Filed Weights   01/01/17 1456 01/02/17 0105  Weight: 51.7 kg (114 lb) 55.9 kg (123 lb 3.8 oz)   Examination: Physical Exam:  Constitutional: Thin elderly AAF who does not want to participate in care Eyes:  Lids and conjunctivae normal, sclerae anicteric  ENMT: External Ears, Nose appear normal. Grossly normal hearing.  Neck: Appears normal, supple, no cervical masses,  normal ROM, no appreciable thyromegaly Respiratory: Clear to auscultation bilaterally, no wheezing, rales, rhonchi or crackles. Normal respiratory effort and patient is not tachypenic. No accessory muscle use.  Cardiovascular: RRR, no murmurs / rubs / gallops. S1 and S2 auscultated. No extremity edema.  Abdomen: Soft, non-tender, non-distended. No masses palpated. No appreciable hepatosplenomegaly. Bowel sounds positive x4.  GU: Deferred. Musculoskeletal: No clubbing / cyanosis of digits/nails. No joint deformity upper and lower extremities.  Skin: No rashes, lesions, ulcers. No induration; Warm and dry.  Neurologic: CN 2-12 grossly intact with no focal deficits. Sensation intact in all 4 Extremities. Romberg sign cerebellar reflexes not assessed.  Psychiatric: Normal judgment and insight. Alert wants to rest. Normal mood and Flat affect.   Data Reviewed: I have personally reviewed following labs and imaging studies  CBC:  Recent Labs Lab 12/30/16 1632 01/01/17 1558 01/02/17 0355 01/02/17 1820  WBC 11.5* 6.6 8.0 7.9  NEUTROABS 7.4 3.6  --   --  HGB 10.1* 6.9* 9.0* 9.0*  HCT 28.5* 23.0* 29.2* 30.0*  MCV 87.9 91.3 89.8 89.6  PLT 129.0* 217 219 [id number]   Basic Metabolic Panel:  Recent Labs Lab 01/01/17 1558 01/02/17 0355  NA 138 140  K 5.3* 5.8*  CL 110 111  CO2 23 22  GLUCOSE 101* 121*  BUN 32* 32*  CREATININE 1.65* 1.47*  CALCIUM 9.0 9.4   GFR: Estimated Creatinine Clearance: 22.5 mL/min (by C-G formula based on SCr of 1.47 mg/dL (H)). Liver Function Tests: No results for input(s): AST, ALT, ALKPHOS, BILITOT, PROT, ALBUMIN in the last 168 hours. No results for input(s): LIPASE, AMYLASE in the last 168 hours. No results for input(s): AMMONIA in the last 168 hours. Coagulation Profile: No results for input(s): INR, PROTIME in the last 168 hours. Cardiac Enzymes:  Recent Labs Lab 01/01/17 1558 01/02/17 0355  TROPONINI 0.23* 0.22*   BNP (last 3 results) No results  for input(s): PROBNP in the last 8760 hours. HbA1C: No results for input(s): HGBA1C in the last 72 hours. CBG:  Recent Labs Lab 12/31/16 1617  GLUCAP 75   Lipid Profile: No results for input(s): CHOL, HDL, LDLCALC, TRIG, CHOLHDL, LDLDIRECT in the last 72 hours. Thyroid Function Tests: No results for input(s): TSH, T4TOTAL, FREET4, T3FREE, THYROIDAB in the last 72 hours. Anemia Panel: No results for input(s): VITAMINB12, FOLATE, FERRITIN, TIBC, IRON, RETICCTPCT in the last 72 hours. Sepsis Labs:  Recent Labs Lab 01/01/17 1603  LATICACIDVEN 1.28    Recent Results (from the past 240 hour(s))  Urine culture     Status: Abnormal   Collection Time: 01/01/17  5:13 PM  Result Value Ref Range Status   Specimen Description URINE, RANDOM  Final   Special Requests NONE  Final   Culture MULTIPLE SPECIES PRESENT, SUGGEST RECOLLECTION (A)  Final   Report Status 01/03/2017 FINAL  Final  MRSA PCR Screening     Status: None   Collection Time: 01/02/17 12:52 AM  Result Value Ref Range Status   MRSA by PCR NEGATIVE NEGATIVE Final    Comment:        The GeneXpert MRSA Assay (FDA approved for NASAL specimens only), is one component of a comprehensive MRSA colonization surveillance program. It is not intended to diagnose MRSA infection nor to guide or monitor treatment for MRSA infections.   Culture, blood (routine x 2)     Status: None (Preliminary result)   Collection Time: 01/02/17  3:55 AM  Result Value Ref Range Status   Specimen Description BLOOD RIGHT ANTECUBITAL  Final   Special Requests BOTTLES DRAWN AEROBIC AND ANAEROBIC 6ML  Final   Culture   Final    NO GROWTH 1 DAY Performed at [facility]    Report Status PENDING  Incomplete    Radiology Studies: Ct Angio Chest Pe W And/or Wo Contrast  Result Date: 01/01/2017 CLINICAL DATA:  Right lung biopsy yesterday.  Respiratory distress. EXAM: CT ANGIOGRAPHY CHEST WITH CONTRAST TECHNIQUE: Multidetector CT imaging of the  chest was performed using the standard protocol during bolus administration of intravenous contrast. Multiplanar CT image reconstructions and MIPs were obtained to evaluate the vascular anatomy. CONTRAST:  80 cc of Isovue 370 COMPARISON:  None. FINDINGS: Cardiovascular: Cardiomegaly is identified. Coronary artery calcifications are noted. The ascending thoracic aorta measures 4.7 cm in AP dimension on series 4, image 46 unchanged in the interval. No dissection. No pulmonary emboli. Mediastinum/Nodes: Small bilateral pleural effusions. There is a pericardial effusion posteriorly which was not  present on the previous PET-CT measuring 2 cm in thickness on series 4, image 63. A mildly prominent node to the right of the aorta on series 4, image 76 is stable. The node anterior to the trachea on series 4, image 33 is more prominent in the interval. The pretracheal node on image 30 is unchanged. No other change in mediastinal nodes. No other adenopathy elsewhere in the chest. The esophagus is normal. Lungs/Pleura: The central airways are unchanged and unremarkable. No pneumothorax. The known nodule just posterior to the right lower lobe bronchus measures 3.2 x 2.9 cm today versus 2.5 by 2.3 cm on the September 08, 2016 PET-CT. There is a small nodule in the left apex on series 11, image 16 not seen on the comparison PET-CT measuring 5 mm today. No other new pulmonary nodules. No overt edema. Small bilateral pleural effusions are identified with associated atelectasis. Upper Abdomen: No acute abnormality. Musculoskeletal: No chest wall abnormality. No acute or significant osseous findings. Review of the MIP images confirms the above findings. IMPRESSION: 1. No pulmonary emboli. 2. The patient's known malignancy in the medial right lower lobe posterior to the right lower lobe bronchus is larger in the interval. A pre tracheal node is more prominent in the interval. No other change within visualized lymph nodes. 3. New  pericardial effusion posteriorly only measuring 2 cm. New small pleural effusions. 4. New small nodule in the left apex. Recommend attention on follow-up. 5. **An incidental finding of potential clinical significance has been found. The ascending thoracic aorta measures 4.7 cm which is stable. Ascending thoracic aortic aneurysm. Recommend semi-annual imaging followup by CTA or MRA and referral to cardiothoracic surgery if not already obtained. This recommendation follows 2010 ACCF/AHA/AATS/ACR/ASA/SCA/SCAI/SIR/STS/SVM Guidelines for the Diagnosis and Management of Patients With Thoracic Aortic Disease. Circulation. 2010; [id number]** Electronically Signed   By: Dorise Bullion III M.D   On: 01/01/2017 19:30   Scheduled Meds: . sodium chloride   Intravenous Once  . aspirin EC  325 mg Oral Daily  . ferrous gluconate  324 mg Oral Daily  . metoprolol tartrate  25 mg Oral BID  . sodium chloride flush  3 mL Intravenous Q12H   Continuous Infusions:   LOS: 2 days   Kerney Elbe, DO [facility] Pager [phone]  If 7PM-7AM, please contact night-coverage [email or web address] Password [email or web address] 01/03/2017, 4:55 PM

## 2017-01-04 ENCOUNTER — Inpatient Hospital Stay (HOSPITAL_COMMUNITY): Payer: Medicare Other

## 2017-01-04 DIAGNOSIS — R071 Chest pain on breathing: Secondary | ICD-10-CM

## 2017-01-04 DIAGNOSIS — R7989 Other specified abnormal findings of blood chemistry: Secondary | ICD-10-CM

## 2017-01-04 DIAGNOSIS — I10 Essential (primary) hypertension: Secondary | ICD-10-CM

## 2017-01-04 DIAGNOSIS — R748 Abnormal levels of other serum enzymes: Secondary | ICD-10-CM

## 2017-01-04 DIAGNOSIS — D649 Anemia, unspecified: Secondary | ICD-10-CM

## 2017-01-04 LAB — CBC WITH DIFFERENTIAL/PLATELET
Basophils Absolute: 0 10*3/uL (ref 0.0–0.1)
Basophils Relative: 0 %
EOS PCT: 0 %
Eosinophils Absolute: 0 10*3/uL (ref 0.0–0.7)
HCT: 31.3 % — ABNORMAL LOW (ref 36.0–46.0)
Hemoglobin: 9.5 g/dL — ABNORMAL LOW (ref 12.0–15.0)
LYMPHS ABS: 1.9 10*3/uL (ref 0.7–4.0)
LYMPHS PCT: 20 %
MCH: 27.2 pg (ref 26.0–34.0)
MCHC: 30.4 g/dL (ref 30.0–36.0)
MCV: 89.7 fL (ref 78.0–100.0)
Monocytes Absolute: 0.7 10*3/uL (ref 0.1–1.0)
Monocytes Relative: 7 %
Neutro Abs: 6.9 10*3/uL (ref 1.7–7.7)
Neutrophils Relative %: 73 %
PLATELETS: 268 10*3/uL (ref 150–400)
RBC: 3.49 MIL/uL — AB (ref 3.87–5.11)
RDW: 17.4 % — AB (ref 11.5–15.5)
WBC: 9.5 10*3/uL (ref 4.0–10.5)

## 2017-01-04 LAB — COMPREHENSIVE METABOLIC PANEL
ALT: 16 U/L (ref 14–54)
AST: 20 U/L (ref 15–41)
Albumin: 2.7 g/dL — ABNORMAL LOW (ref 3.5–5.0)
Alkaline Phosphatase: 85 U/L (ref 38–126)
Anion gap: 8 (ref 5–15)
BILIRUBIN TOTAL: 0.7 mg/dL (ref 0.3–1.2)
BUN: 52 mg/dL — ABNORMAL HIGH (ref 6–20)
CHLORIDE: 114 mmol/L — AB (ref 101–111)
CO2: 21 mmol/L — ABNORMAL LOW (ref 22–32)
CREATININE: 1.36 mg/dL — AB (ref 0.44–1.00)
Calcium: 10.1 mg/dL (ref 8.9–10.3)
GFR, EST AFRICAN AMERICAN: 40 mL/min — AB (ref >60)
GFR, EST NON AFRICAN AMERICAN: 35 mL/min — AB (ref >60)
Glucose, Bld: 82 mg/dL (ref 65–99)
Potassium: 5.4 mmol/L — ABNORMAL HIGH (ref 3.5–5.1)
Sodium: 143 mmol/L (ref 135–145)
TOTAL PROTEIN: 6.5 g/dL (ref 6.5–8.1)

## 2017-01-04 LAB — MAGNESIUM: MAGNESIUM: 2.4 mg/dL (ref 1.7–2.4)

## 2017-01-04 LAB — PHOSPHORUS: PHOSPHORUS: 3.8 mg/dL (ref 2.5–4.6)

## 2017-01-04 MED ORDER — SODIUM POLYSTYRENE SULFONATE 15 GM/60ML PO SUSP
30.0000 g | Freq: Once | ORAL | Status: AC
  Administered 2017-01-04: 30 g via ORAL
  Filled 2017-01-04: qty 120

## 2017-01-04 MED ORDER — FUROSEMIDE 10 MG/ML IJ SOLN
20.0000 mg | Freq: Two times a day (BID) | INTRAMUSCULAR | Status: DC
  Administered 2017-01-04 – 2017-01-05 (×2): 20 mg via INTRAVENOUS
  Filled 2017-01-04 (×3): qty 2

## 2017-01-04 NOTE — Progress Notes (Addendum)
PROGRESS NOTE    Valerie Downs  [id number] DOB: [birth year] DOA: 01/01/2017 PCP: Mauricio Po, FNP   Brief Narrative:  Valerie Downs is a 83 y.o. female with medical history significant of breast cancer (s/p mastectomy 1984), HTN, CHF, aortic insufficiency and mitral regurgitation presenting to the ER. With SOB. Pt reports she had left lung biopsy performed [id number] denies any complications s/p procedure but notes when she woke up at 8am she had worsening SOB. She also notes she has had a productive cough ("cream sputum") for the past 3 days. Pt also reports she has been having intermittent midsternal CP for the past 3 days that typically occurs at night while she is laying in bed. She reports having heavy pressure sensation that only lasts a few seconds and then resolves spontaneously. Denies fever, chills, HA, rhinorrhea, hemoptysis, wheezing, palpitations, abdominal pain, N/V/D, urinary sxs, leg swelling. Pt reports smoking 2-3 cigarettes daily. CT of chest done. Admitted for Symptomatic Anemia and Chest Pain with Pericardial Effusion. Refused to have blood draws and refusing care. Transferred from SDU to Gen Med Floor with Telemetry on 01/03/2017. Cardiology to evaluate her Pericardial Effusion.   Assessment & Plan:   Principal Problem:   Symptomatic anemia Active Problems:   HYPERTENSION, BENIGN SYSTEMIC   Chest pain   Ovarian mass, left   Dyspnea   Acute renal failure (ARF) (HCC)   Right lower lobe lung mass   Fever   Elevated troponin   Pericardial effusion   Elevated brain natriuretic peptide (BNP) level  Symptomatic anemia, improved  -UNABLE to ASSESS Hb today as patient refused Blood Draws -This is a potentially very complicated patient with multiple issues which may be related or independent -Her Hgb was 10.1 on 1/4 and was 6.9 on admission; S/p Transfusion Hb was 9.5 -She presented with SOB, indicating symptomatic anemia; Concerning for post-procedure hemorrhage s/p  Bronchoscopy and EBUS -The only thing that happened in the interim was an endobronchial ultrasound with needle aspiration of pathologically enlarged subcarinal and right pretracheal lymph nodes. -CXR and CT yesterday do not obviously indicate where this blood may have gone, but there is now noted a 2 cm pericardial effusion. -Admitted to SDU and now transferred to [facility]; PCCM Following  -For now, she is being transfused 1 unit PRBC as ordered in the ER. -She is heme negative and there is no other apparent source of her bleeding at this time, so it is currently assumed to be a post-procedure complication unless another bleeding source becomes evident.  -Continue with Ferrous gluconate 324 mg po Daily -Repeat CBC in AM if Patient allows  Chest pain with Pericardial Effusion; unlikely Cardiac But likely Demand -Patient has also been reportedly complaining of chest pain. -Her troponin is elevated to 0.23 - likely elated to demand ischemia in the setting of symptomatic anemia or to her acute renal failure (see below). --Her BNP is also elevated - 2298 (1417 on 7/11).  She does not otherwise have obvious evidence of CHF exacerbation at this time. Started IV Diuresis with IV Lasix 20 mg q12h.  -She also appears to have a 2 cm pericardial effusion, as above, which could be blood from the tap.   -She is having ectopy on the telemetry - PVCs as well as intermittent possible afib. -As above, PCCM is aware of the patient and her potential serious complications. -ECHO done and showed Low normal LV systolic function; severe LVH; biatrial   enlargement; moderate AI; mild MR and TR; severely elevated  pulmonary pressure; small pericardial effusion  -Cardiology thinks it is small and insignificant with no Heymodynamic compromise  Acute Renal Failure; improving  -Creatinine 1.65 (1.05 on 7/12); Cr 1.36; D/C'd IVF Rehydration -Repeat CMP in AM  Hyperkalemia -Patient's K+ was 5.4;  -Losartan Discontinued;  D/C'd IVF at 50 mL/hr -Will diurese with IV Lasix 20 mg q12h -Repeat CMP in AM  Fever, Resolved -Per pulmonology, this can be a reactive process to the bronchoscopy and would not necessarily warrant antibiotics. 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 -Regardless, she has been unwilling to agree to several treatment options offered to her and so may need to consider palliative/comfort care measures soon. -Will await biopsy results and then proceed accordingly.   DVT prophylaxis: SCDs Code Status: FULL Family Communication: Son at bedside Disposition Plan: Possibly be D/C'd in AM  Consultants:   PCCM  Cardiology  Procedures: s/p Bronchoscopy and EBUS  Antimicrobials: None  Subjective: Seen and examined at bedside was feeling better. Had no Chest Pain or Shortness of breath. No N/V. Had no other complaints or concerns at this time.   Objective: Vitals:   01/03/17 2055 01/04/17 0600 01/04/17 0851 01/04/17 1341  BP: (!) 133/56 (!) 158/60 (!) 160/65 (!) 162/60  Pulse: 96 79 80 61  Resp: 18 (!) 24  20  Temp: 98.4 F (36.9 C) 97.9 F (36.6 C)  98.9 F (37.2 C)  TempSrc: Oral Oral  Oral  SpO2: 94% 99%  99%  Weight:      Height:       No intake or output data in the 24 hours ending 01/04/17 2056 Filed Weights   01/01/17 1456 01/02/17 0105  Weight: 51.7 kg (114 lb) 55.9 kg (123 lb 3.8 oz)   Examination: Physical Exam:  Constitutional: Thin elderly AAF was more pleasant today.  Eyes:  Lids and conjunctivae normal, sclerae anicteric  ENMT: External Ears, Nose appear normal. Grossly normal hearing.  Neck: Appears normal, supple, no cervical masses, normal ROM, no appreciable thyromegaly, no visible JVD.  Respiratory: Clear to auscultation bilaterally, no wheezing, rales, rhonchi or  crackles. Normal respiratory effort and patient is not tachypenic. No accessory muscle use.  Cardiovascular: RRR, no murmurs / rubs / gallops. S1 and S2 auscultated. No extremity edema.  Abdomen: Soft, non-tender, non-distended. No masses palpated. No appreciable hepatosplenomegaly. Bowel sounds positive x4.  GU: Deferred. Musculoskeletal: No clubbing / cyanosis of digits/nails. No joint deformity upper and lower extremities.  Skin: No rashes, lesions, ulcers. No induration; Warm and dry.  Neurologic: CN 2-12 grossly intact with no focal deficits. Sensation intact in all 4 Extremities. Romberg sign cerebellar reflexes not assessed.  Psychiatric: Normal judgment and insight. Alert wants to rest. Normal mood and Flat affect.   Data Reviewed: I have personally reviewed following labs and imaging studies  CBC:  Recent Labs Lab 12/30/16 1632 01/01/17 1558 01/02/17 0355 01/02/17 1820 01/04/17 0724  WBC 11.5* 6.6 8.0 7.9 9.5  NEUTROABS 7.4 3.6  --   --  6.9  HGB 10.1* 6.9* 9.0* 9.0* 9.5*  HCT 28.5* 23.0* 29.2* 30.0* 31.3*  MCV 87.9 91.3 89.8 89.6 89.7  PLT 129.0* 217 219 220 [id number]   Basic Metabolic Panel:  Recent Labs Lab 01/01/17 1558 01/02/17 0355 01/04/17 0724  NA 138 140 143  K 5.3* 5.8* 5.4*  CL 110 111 114*  CO2 23 22 21*  GLUCOSE 101* 121* 82  BUN 32* 32* 52*  CREATININE 1.65* 1.47* 1.36*  CALCIUM 9.0 9.4 10.1  MG  --   --  2.4  PHOS  --   --  3.8   GFR: Estimated Creatinine Clearance: 24.4 mL/min (by C-G formula based on SCr of 1.36 mg/dL (H)). Liver Function Tests:  Recent Labs Lab 01/04/17 0724  AST 20  ALT 16  ALKPHOS 85  BILITOT 0.7  PROT 6.5  ALBUMIN 2.7*   No results for input(s): LIPASE, AMYLASE in the last 168 hours. No results for input(s): AMMONIA in the last 168 hours. Coagulation Profile: No results for input(s): INR, PROTIME in the last 168 hours. Cardiac Enzymes:  Recent Labs Lab 01/01/17 1558 01/02/17 0355  TROPONINI 0.23* 0.22*    BNP (last 3 results) No results for input(s): PROBNP in the last 8760 hours. HbA1C: No results for input(s): HGBA1C in the last 72 hours. CBG:  Recent Labs Lab 12/31/16 1617  GLUCAP 75   Lipid Profile: No results for input(s): CHOL, HDL, LDLCALC, TRIG, CHOLHDL, LDLDIRECT in the last 72 hours. Thyroid Function Tests: No results for input(s): TSH, T4TOTAL, FREET4, T3FREE, THYROIDAB in the last 72 hours. Anemia Panel: No results for input(s): VITAMINB12, FOLATE, FERRITIN, TIBC, IRON, RETICCTPCT in the last 72 hours. Sepsis Labs:  Recent Labs Lab 01/01/17 1603  LATICACIDVEN 1.28    Recent Results (from the past 240 hour(s))  Urine culture     Status: Abnormal   Collection Time: 01/01/17  5:13 PM  Result Value Ref Range Status   Specimen Description URINE, RANDOM  Final   Special Requests NONE  Final   Culture MULTIPLE SPECIES PRESENT, SUGGEST RECOLLECTION (A)  Final   Report Status 01/03/2017 FINAL  Final  MRSA PCR Screening     Status: None   Collection Time: 01/02/17 12:52 AM  Result Value Ref Range Status   MRSA by PCR NEGATIVE NEGATIVE Final    Comment:        The GeneXpert MRSA Assay (FDA approved for NASAL specimens only), is one component of a comprehensive MRSA colonization surveillance program. It is not intended to diagnose MRSA infection nor to guide or monitor treatment for MRSA infections.   Culture, blood (routine x 2)     Status: None (Preliminary result)   Collection Time: 01/02/17  3:55 AM  Result Value Ref Range Status   Specimen Description BLOOD RIGHT ANTECUBITAL  Final   Special Requests BOTTLES DRAWN AEROBIC AND ANAEROBIC 6ML  Final   Culture   Final    NO GROWTH 2 DAYS Performed at [facility]    Report Status PENDING  Incomplete    Radiology Studies: Dg Chest Port 1 View  Result Date: 01/04/2017 CLINICAL DATA:  Shortness of breath EXAM: PORTABLE CHEST 1 VIEW COMPARISON:  Three days ago FINDINGS: Cardiopericardial enlargement  and aortic tortuosity, stable. Mild basilar atelectasis. No air bronchogram or edema. No effusion or pneumothorax. Nodule behind the right hilum not seen radiographically. Postoperative left axilla. IMPRESSION: 1. Stable from 3 days prior. 2. Cardiomegaly without failure. Electronically Signed   By: Monte Fantasia M.D.   On: 01/04/2017 07:02   Scheduled Meds: . sodium chloride   Intravenous Once  . aspirin EC  325 mg Oral Daily  . ferrous gluconate  324 mg Oral Daily  . furosemide  20 mg Intravenous Q12H  . metoprolol tartrate  25 mg Oral BID  .  sodium chloride flush  3 mL Intravenous Q12H   Continuous Infusions:   LOS: 3 days   Kerney Elbe, DO [facility] Pager [phone]  If 7PM-7AM, please contact night-coverage [email or web address] Password [email or web address] 01/04/2017, 8:56 PM

## 2017-01-04 NOTE — Consult Note (Signed)
Reason for Consult: Chest Pain with Pericardial Effusion, need for Pericardiocentesis Referring Physician: Kerney Elbe, DO  HPI: Valerie Downs is an 83 y.o. female with pmhx of breast cancer, HTN, CHF, LV dysfunction d/t non-ischemic cardiomyopathy, aortic insufficiency, and mitral regurg who presented to the ED for SOB and a productive cough. Patient underwent a left lung biopsy on 04/30/72 without complications. She awoke 01/01/17 with worsening SOB. Productive cough with "cream sputum" began 12/29/16 along with intermittent midsternal CP, described as a heavy pressure sensation lasting a few seconds, & resolving spontaneously. Pressure typically occurs at night while she is laying in bed. Denies fever, chills, HA, rhinorrhea, hemoptysis, wheezing, palpitations, abdominal pain, N/V/D, urinary sxs, leg swelling. Pt reports smoking 2-3 cigarettes daily. 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   CBC significant for anemia with Hgb 6.9 and Hct of 23.0. Troponin is elevated to 0.23 on 1/06. BNP also elevated at 2298 on 1/06. CT of chest done displaying pericardial effusion posteriorly, measuring 2cm. Also displayed small pleural effusions and ascending thoracic aortic aneurysm measuring 4.7cm. ECG displayed PVC's as well as intermittent possible afib. ECHO done and showed EF 50-55%, Low normal LV systolic function; severe LVH; biatrial enlargement; moderate AI; mild MR and TR; severely elevatedpulmonary pressure; small pericardial effusion. Prior Echo 07/07/16 EF 60-65%. Wt elevated 9lbs 3.8oz from 01/01/17.  Cr elevated from baseline (1.05 on 7/12) to 1.65, indicated AKI. Systolic Bp has remained elevated during stay.   Followed by Cardiologist Dr. Angelena Form outpatient. No prior cardiac catheterization.  History was performed with Dr. Ellyn Hack.    Past Medical History:  Diagnosis Date  . Aortic insufficiency   . Blood transfusion without reported diagnosis   . Breast cancer ([place])   .  Cardiomyopathy ([place])   . CHF (congestive heart failure) ([place])   . Hypertension   . Mitral regurgitation     Past Surgical History:  Procedure Laterality Date  . COLONOSCOPY N/A 06/22/2014   Procedure: COLONOSCOPY;  Surgeon: Jerene Bears, MD;  Location: [facility];  Service: Endoscopy;  Laterality: N/A;  . ENDOBRONCHIAL ULTRASOUND N/A 12/31/2016   Procedure: ENDOBRONCHIAL ULTRASOUND;  Surgeon: Juanito Doom, MD;  Location: [facility];  Service: Cardiopulmonary;  Laterality: N/A;  . ESOPHAGOGASTRODUODENOSCOPY N/A 06/22/2014   Procedure: ESOPHAGOGASTRODUODENOSCOPY (EGD);  Surgeon: Jerene Bears, MD;  Location: [facility];  Service: Endoscopy;  Laterality: N/A;  . MASTECTOMY Left     Family History  Problem Relation Age of Onset  . Hypertension Mother   . Cancer Father     Social History:  reports that she has been smoking Cigarettes.  She has a 12.50 pack-year smoking history. She has quit using smokeless tobacco. Her smokeless tobacco use included Snuff. She reports that she does not drink alcohol or use drugs.  Allergies:  Allergies  Allergen Reactions  . Penicillins Anaphylaxis and Swelling    Has patient had a PCN reaction causing immediate rash, facial/tongue/throat swelling, SOB or lightheadedness with hypotension: Yes Has patient had a PCN reaction causing severe rash involving mucus membranes or skin necrosis: Yes Has patient had a PCN reaction that required hospitalization Yes Has patient had a PCN reaction occurring within the last 10 years: No If all of the above answers are "NO", then may proceed with Cephalosporin use.     No current facility-administered medications on file prior to encounter.    Current Outpatient Prescriptions on File Prior to Encounter  Medication Sig Dispense Refill  . aspirin EC 325 MG tablet  Take 325 mg by mouth daily.    . ferrous gluconate (IRON 27) 240 (27 FE) MG tablet Take 240 mg by mouth daily.    Marland Kitchen losartan (COZAAR) 25 MG tablet  Take 25 mg by mouth daily.    . metoprolol tartrate (LOPRESSOR) 25 MG tablet Take 1 tablet (25 mg total) by mouth 2 (two) times daily. 180 tablet 3  . Multiple Vitamins-Minerals (CENTRUM SILVER 50+WOMEN) TABS Take 1 tablet by mouth daily.      Current Facility-Administered Medications:  .  0.9 %  sodium chloride infusion, , Intravenous, Once, [employer], PA-C .  acetaminophen (TYLENOL) tablet 650 mg, 650 mg, Oral, Q6H PRN **OR** acetaminophen (TYLENOL) suppository 650 mg, 650 mg, Rectal, Q6H PRN, Karmen Bongo, MD .  aspirin EC tablet 325 mg, 325 mg, Oral, Daily, Karmen Bongo, MD, 325 mg at 01/04/17 1000 .  ferrous gluconate (FERGON) tablet 324 mg, 324 mg, Oral, Daily, Karmen Bongo, MD, 324 mg at 01/04/17 1000 .  furosemide (LASIX) injection 20 mg, 20 mg, Intravenous, Q12H, Omair Latif Sheikh, DO, 20 mg at 01/04/17 1337 .  hydrALAZINE (APRESOLINE) injection 10-40 mg, 10-40 mg, Intravenous, Q4H PRN, Rigoberto Noel, MD, 10 mg at 01/03/17 1715 .  metoprolol tartrate (LOPRESSOR) tablet 25 mg, 25 mg, Oral, BID, Karmen Bongo, MD, 25 mg at 01/04/17 0851 .  ondansetron (ZOFRAN) tablet 4 mg, 4 mg, Oral, Q6H PRN **OR** ondansetron (ZOFRAN) injection 4 mg, 4 mg, Intravenous, Q6H PRN, Karmen Bongo, MD .  sodium chloride flush (NS) 0.9 % injection 3 mL, 3 mL, Intravenous, Q12H, Karmen Bongo, MD, 3 mL at 01/03/17 2125   Results for orders placed or performed during the hospital encounter of 01/01/17 (from the past 48 hour(s))  CBC     Status: Abnormal   Collection Time: 01/02/17  6:20 PM  Result Value Ref Range   WBC 7.9 4.0 - 10.5 K/uL   RBC 3.35 (L) 3.87 - 5.11 MIL/uL   Hemoglobin 9.0 (L) 12.0 - 15.0 g/dL   HCT 30.0 (L) 36.0 - 46.0 %   MCV 89.6 78.0 - 100.0 fL   MCH 26.9 26.0 - 34.0 pg   MCHC 30.0 30.0 - 36.0 g/dL   RDW 18.0 (H) 11.5 - 15.5 %   Platelets 220 150 - 400 K/uL  CBC with Differential/Platelet     Status: Abnormal   Collection Time: 01/04/17  7:24 AM  Result  Value Ref Range   WBC 9.5 4.0 - 10.5 K/uL   RBC 3.49 (L) 3.87 - 5.11 MIL/uL   Hemoglobin 9.5 (L) 12.0 - 15.0 g/dL   HCT 31.3 (L) 36.0 - 46.0 %   MCV 89.7 78.0 - 100.0 fL   MCH 27.2 26.0 - 34.0 pg   MCHC 30.4 30.0 - 36.0 g/dL   RDW 17.4 (H) 11.5 - 15.5 %   Platelets 268 150 - 400 K/uL   Neutrophils Relative % 73 %   Neutro Abs 6.9 1.7 - 7.7 K/uL   Lymphocytes Relative 20 %   Lymphs Abs 1.9 0.7 - 4.0 K/uL   Monocytes Relative 7 %   Monocytes Absolute 0.7 0.1 - 1.0 K/uL   Eosinophils Relative 0 %   Eosinophils Absolute 0.0 0.0 - 0.7 K/uL   Basophils Relative 0 %   Basophils Absolute 0.0 0.0 - 0.1 K/uL  Comprehensive metabolic panel     Status: Abnormal   Collection Time: 01/04/17  7:24 AM  Result Value Ref Range   Sodium 143 135 -  145 mmol/L   Potassium 5.4 (H) 3.5 - 5.1 mmol/L   Chloride 114 (H) 101 - 111 mmol/L   CO2 21 (L) 22 - 32 mmol/L   Glucose, Bld 82 65 - 99 mg/dL   BUN 52 (H) 6 - 20 mg/dL   Creatinine, Ser 1.36 (H) 0.44 - 1.00 mg/dL   Calcium 10.1 8.9 - 10.3 mg/dL   Total Protein 6.5 6.5 - 8.1 g/dL   Albumin 2.7 (L) 3.5 - 5.0 g/dL   AST 20 15 - 41 U/L   ALT 16 14 - 54 U/L   Alkaline Phosphatase 85 38 - 126 U/L   Total Bilirubin 0.7 0.3 - 1.2 mg/dL   GFR calc non Af Amer 35 (L) >60 mL/min   GFR calc Af Amer 40 (L) >60 mL/min    Comment: (NOTE) The eGFR has been calculated using the CKD EPI equation. This calculation has not been validated in all clinical situations. eGFR's persistently <60 mL/min signify possible Chronic Kidney Disease.    Anion gap 8 5 - 15  Magnesium     Status: None   Collection Time: 01/04/17  7:24 AM  Result Value Ref Range   Magnesium 2.4 1.7 - 2.4 mg/dL  Phosphorus     Status: None   Collection Time: 01/04/17  7:24 AM  Result Value Ref Range   Phosphorus 3.8 2.5 - 4.6 mg/dL    Dg Chest Port 1 View  Result Date: 01/04/2017 CLINICAL DATA:  Shortness of breath EXAM: PORTABLE CHEST 1 VIEW COMPARISON:  Three days ago FINDINGS:  Cardiopericardial enlargement and aortic tortuosity, stable. Mild basilar atelectasis. No air bronchogram or edema. No effusion or pneumothorax. Nodule behind the right hilum not seen radiographically. Postoperative left axilla. IMPRESSION: 1. Stable from 3 days prior. 2. Cardiomegaly without failure. Electronically Signed   By: Monte Fantasia M.D.   On: 01/04/2017 07:02    ROS  Performed with Dr. Ellyn Hack   Blood pressure (!) 160/65, pulse 80, temperature 97.9 F (36.6 C), temperature source Oral, resp. rate (!) 24, height 5' 2"  (1.575 m), weight 123 lb 3.8 oz (55.9 kg), SpO2 99 %. Physical Exam  Performed with Dr. Ellyn Hack   Assessment/Plan: Principal Problem:   Symptomatic anemia Active Problems:   HYPERTENSION, BENIGN SYSTEMIC   Chest pain   Ovarian mass, left   Dyspnea   Acute renal failure (ARF) (HCC)   Right lower lobe lung mass   Fever   Elevated troponin   Pericardial effusion   Elevated brain natriuretic peptide (BNP) level  Chest Pain with Pericardial effusion  -Agree that Tn likely elated to demand ischemia in the setting of symptomatic anemia or to her acute renal failure.  -Do not feel that pericardial effusion is significant enough for tap. Monitor and re-evaluate if patient is to become symptomatic.  -Continue Lasix 70m q12hrs. Progress to oral discharge dose. -Treat systolic BP with Losartan pending resolution of Hyperkalemia.  -Follow up with Cardiology on an outpatient basis following discharge.  Pending Dr. HLevonne LappingM Maczis 01/04/2017, 1:43 PM

## 2017-01-04 NOTE — Consult Note (Addendum)
Reason for Consult: Chest Pain with Pericardial Effusion, need for Pericardiocentesis Referring Physician: Kerney Elbe, DO  HPI: Valerie Downs is an 83 y.o. female with pmhx of breast cancer, HTN, CHF, LV dysfunction d/t non-ischemic cardiomyopathy, aortic insufficiency, and mitral regurg who presented to the ED for SOB and a productive cough. Patient underwent a left lung biopsy on 08/03/74 without complications. She awoke 01/01/17 with worsening SOB. Productive cough with "cream sputum" began 12/29/16 along with intermittent midsternal CP, described as a heavy pressure sensation lasting a few seconds, & resolving spontaneously. Pressure typically occurs at night while she is laying in bed. Denies fever, chills, HA, rhinorrhea, hemoptysis, wheezing, palpitations, abdominal pain, N/V/D, urinary sxs, leg swelling. Pt reports smoking 2-3 cigarettes daily. 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   CBC significant for anemia with Hgb 6.9 and Hct of 23.0. Troponin is elevated to 0.23 on 1/06. BNP also elevated at 2298 on 1/06. CT of chest done displaying pericardial effusion posteriorly, measuring 2cm. Also displayed small pleural effusions and ascending thoracic aortic aneurysm measuring 4.7cm. ECG displayed PVC's as well as intermittent possible afib. ECHO done and showed EF 50-55%, Low normal LV systolic function; severe LVH; biatrial enlargement; moderate AI; mild MR and TR; severely elevatedpulmonary pressure; small pericardial effusion. Prior Echo 07/07/16 EF 60-65%. Wt elevated 9lbs 3.8oz from 01/01/17.  Cr elevated from baseline (1.05 on 7/12) to 1.65, indicated AKI. Systolic Bp has remained elevated during stay.   Followed by Cardiologist Dr. Angelena Form outpatient. No prior cardiac catheterization.  History was performed with Dr. Ellyn Hack.        Past Medical History:  Diagnosis Date  . Aortic insufficiency   . Blood transfusion without reported diagnosis   . Breast cancer  ([place])   . Cardiomyopathy ([place])   . CHF (congestive heart failure) ([place])   . Hypertension   . Mitral regurgitation          Past Surgical History:  Procedure Laterality Date  . COLONOSCOPY N/A 06/22/2014   Procedure: COLONOSCOPY;  Surgeon: Jerene Bears, MD;  Location: [facility];  Service: Endoscopy;  Laterality: N/A;  . ENDOBRONCHIAL ULTRASOUND N/A 12/31/2016   Procedure: ENDOBRONCHIAL ULTRASOUND;  Surgeon: Juanito Doom, MD;  Location: [facility];  Service: Cardiopulmonary;  Laterality: N/A;  . ESOPHAGOGASTRODUODENOSCOPY N/A 06/22/2014   Procedure: ESOPHAGOGASTRODUODENOSCOPY (EGD);  Surgeon: Jerene Bears, MD;  Location: [facility];  Service: Endoscopy;  Laterality: N/A;  . MASTECTOMY Left          Family History  Problem Relation Age of Onset  . Hypertension Mother   . Cancer Father     Social History:  reports that she has been smoking Cigarettes.  She has a 12.50 pack-year smoking history. She has quit using smokeless tobacco. Her smokeless tobacco use included Snuff. She reports that she does not drink alcohol or use drugs.  Allergies:       Allergies  Allergen Reactions  . Penicillins Anaphylaxis and Swelling    Has patient had a PCN reaction causing immediate rash, facial/tongue/throat swelling, SOB or lightheadedness with hypotension: Yes Has patient had a PCN reaction causing severe rash involving mucus membranes or skin necrosis: Yes Has patient had a PCN reaction that required hospitalization Yes Has patient had a PCN reaction occurring within the last 10 years: No If all of the above answers are "NO", then may proceed with Cephalosporin use.    No current facility-administered medications on file prior to encounter.  Current Outpatient Prescriptions on File Prior to Encounter  Medication Sig Dispense Refill  . aspirin EC 325 MG tablet Take 325 mg by mouth daily.    . ferrous gluconate (IRON 27) 240 (27 FE) MG tablet Take  240 mg by mouth daily.    Marland Kitchen losartan (COZAAR) 25 MG tablet Take 25 mg by mouth daily.    . metoprolol tartrate (LOPRESSOR) 25 MG tablet Take 1 tablet (25 mg total) by mouth 2 (two) times daily. 180 tablet 3  . Multiple Vitamins-Minerals (CENTRUM SILVER 50+WOMEN) TABS Take 1 tablet by mouth daily.      Current Facility-Administered Medications:  .  0.9 %  sodium chloride infusion, , Intravenous, Once, [employer], PA-C .  acetaminophen (TYLENOL) tablet 650 mg, 650 mg, Oral, Q6H PRN **OR** acetaminophen (TYLENOL) suppository 650 mg, 650 mg, Rectal, Q6H PRN, Karmen Bongo, MD .  aspirin EC tablet 325 mg, 325 mg, Oral, Daily, Karmen Bongo, MD, 325 mg at 01/04/17 1000 .  ferrous gluconate (FERGON) tablet 324 mg, 324 mg, Oral, Daily, Karmen Bongo, MD, 324 mg at 01/04/17 1000 .  furosemide (LASIX) injection 20 mg, 20 mg, Intravenous, Q12H, Omair Latif Sheikh, DO, 20 mg at 01/04/17 1337 .  hydrALAZINE (APRESOLINE) injection 10-40 mg, 10-40 mg, Intravenous, Q4H PRN, Rigoberto Noel, MD, 10 mg at 01/03/17 1715 .  metoprolol tartrate (LOPRESSOR) tablet 25 mg, 25 mg, Oral, BID, Karmen Bongo, MD, 25 mg at 01/04/17 0851 .  ondansetron (ZOFRAN) tablet 4 mg, 4 mg, Oral, Q6H PRN **OR** ondansetron (ZOFRAN) injection 4 mg, 4 mg, Intravenous, Q6H PRN, Karmen Bongo, MD .  sodium chloride flush (NS) 0.9 % injection 3 mL, 3 mL, Intravenous, Q12H, Karmen Bongo, MD, 3 mL at 01/03/17 2125   Lab Results Last 48 Hours        Results for orders placed or performed during the hospital encounter of 01/01/17 (from the past 48 hour(s))  CBC     Status: Abnormal   Collection Time: 01/02/17  6:20 PM  Result Value Ref Range   WBC 7.9 4.0 - 10.5 K/uL   RBC 3.35 (L) 3.87 - 5.11 MIL/uL   Hemoglobin 9.0 (L) 12.0 - 15.0 g/dL   HCT 30.0 (L) 36.0 - 46.0 %   MCV 89.6 78.0 - 100.0 fL   MCH 26.9 26.0 - 34.0 pg   MCHC 30.0 30.0 - 36.0 g/dL   RDW 18.0 (H) 11.5 - 15.5 %   Platelets 220 150 -  400 K/uL  CBC with Differential/Platelet     Status: Abnormal   Collection Time: 01/04/17  7:24 AM  Result Value Ref Range   WBC 9.5 4.0 - 10.5 K/uL   RBC 3.49 (L) 3.87 - 5.11 MIL/uL   Hemoglobin 9.5 (L) 12.0 - 15.0 g/dL   HCT 31.3 (L) 36.0 - 46.0 %   MCV 89.7 78.0 - 100.0 fL   MCH 27.2 26.0 - 34.0 pg   MCHC 30.4 30.0 - 36.0 g/dL   RDW 17.4 (H) 11.5 - 15.5 %   Platelets 268 150 - 400 K/uL   Neutrophils Relative % 73 %   Neutro Abs 6.9 1.7 - 7.7 K/uL   Lymphocytes Relative 20 %   Lymphs Abs 1.9 0.7 - 4.0 K/uL   Monocytes Relative 7 %   Monocytes Absolute 0.7 0.1 - 1.0 K/uL   Eosinophils Relative 0 %   Eosinophils Absolute 0.0 0.0 - 0.7 K/uL   Basophils Relative 0 %   Basophils Absolute 0.0 0.0 -  0.1 K/uL  Comprehensive metabolic panel     Status: Abnormal   Collection Time: 01/04/17  7:24 AM  Result Value Ref Range   Sodium 143 135 - 145 mmol/L   Potassium 5.4 (H) 3.5 - 5.1 mmol/L   Chloride 114 (H) 101 - 111 mmol/L   CO2 21 (L) 22 - 32 mmol/L   Glucose, Bld 82 65 - 99 mg/dL   BUN 52 (H) 6 - 20 mg/dL   Creatinine, Ser 1.36 (H) 0.44 - 1.00 mg/dL   Calcium 10.1 8.9 - 10.3 mg/dL   Total Protein 6.5 6.5 - 8.1 g/dL   Albumin 2.7 (L) 3.5 - 5.0 g/dL   AST 20 15 - 41 U/L   ALT 16 14 - 54 U/L   Alkaline Phosphatase 85 38 - 126 U/L   Total Bilirubin 0.7 0.3 - 1.2 mg/dL   GFR calc non Af Amer 35 (L) >60 mL/min   GFR calc Af Amer 40 (L) >60 mL/min    Comment: (NOTE) The eGFR has been calculated using the CKD EPI equation. This calculation has not been validated in all clinical situations. eGFR's persistently <60 mL/min signify possible Chronic Kidney Disease.   Anion gap 8 5 - 15  Magnesium     Status: None   Collection Time: 01/04/17  7:24 AM  Result Value Ref Range   Magnesium 2.4 1.7 - 2.4 mg/dL  Phosphorus     Status: None   Collection Time: 01/04/17  7:24 AM  Result Value Ref Range   Phosphorus 3.8 2.5 - 4.6 mg/dL        Imaging Results (Last 48 hours)  Dg Chest Port 1 View  Result Date: 01/04/2017 CLINICAL DATA:  Shortness of breath EXAM: PORTABLE CHEST 1 VIEW COMPARISON:  Three days ago FINDINGS: Cardiopericardial enlargement and aortic tortuosity, stable. Mild basilar atelectasis. No air bronchogram or edema. No effusion or pneumothorax. Nodule behind the right hilum not seen radiographically. Postoperative left axilla. IMPRESSION: 1. Stable from 3 days prior. 2. Cardiomegaly without failure. Electronically Signed   By: Monte Fantasia M.D.   On: 01/04/2017 07:02     ROS  Performed with Dr. Ellyn Hack   Blood pressure (!) 160/65, pulse 80, temperature 97.9 F (36.6 C), temperature source Oral, resp. rate (!) 24, height 5' 2"  (1.575 m), weight 123 lb 3.8 oz (55.9 kg), SpO2 99 %. Physical Exam  Performed with Dr. Ellyn Hack  General appearance: alert, cooperative, appears stated age, no distress and chronically ill appearing  Neck: no adenopathy, no carotid bruit, no JVD and supple, symmetrical, trachea midline Lungs: mild diffuse crackles, no rales or rhonchi.  Non-labored Heart: somewhat irregular rhythm, normal S1 & S with soft SEM @ RUSB.  No R/G. Non-displaced PMI Abdomen: soft, non-tender; bowel sounds normal; no masses,  no organomegaly Extremities: extremities normal, atraumatic, no cyanosis or edema Pulses: 2+ and symmetric Skin: Skin color, texture, turgor normal. No rashes or lesions Neurologic: Grossly normal; CN II-XII grossly intact   Assessment/Plan: Principal Problem: Symptomatic anemia Active Problems: HYPERTENSION, BENIGN SYSTEMIC Chest pain Ovarian mass, left Dyspnea Acute renal failure (ARF) (HCC) Right lower lobe lung mass Fever Elevated troponin Pericardial effusion Elevated brain natriuretic peptide (BNP) level  Chest Pain with Pericardial effusion  -Agree that Tn likely elated to demand ischemia in the setting of symptomatic anemia or to  her acute renal failure.  -Do not feel that pericardial effusion is significant enough for tap. Monitor and re-evaluate if patient is to become symptomatic.  -Continue  Lasix 43m q12hrs. Progress to oral discharge dose. -Treat systolic BP with Losartan pending resolution of Hyperkalemia.  -Follow up with Cardiology on an outpatient basis following discharge.  Pending Dr. HLevonne LappingM Maczis 01/04/2017, 1:43 PM    I have seen, examined and evaluated the patient this AM along with Mr. MRodolph Bong& LCecilie Kicks NP.  After reviewing all the available data and chart, we discussed the patients laboratory, study & physical findings as well as symptoms in detail. I agree with their findings, examination as well as impression recommendations as per our discussion.    CT chest &  Echo reviewed - small, insignificant pericardial effusion noted.  Much smaller on Echo than CT. No sign of hemodynamic compromise.  Flat troponin elevation - related to acute respiratory distress post-procedure.  No further CP.  Really seems euvolemic, but agree with diuretic with HyperKalemia. Restart BB. Hold ARB for now given K 5.4. May need Amlodipine.   DGlenetta Hew M.D., M.S. Interventional Cardiologist   Pager # [phone] # [phone] [address] [place] [id number]

## 2017-01-05 DIAGNOSIS — N839 Noninflammatory disorder of ovary, fallopian tube and broad ligament, unspecified: Secondary | ICD-10-CM

## 2017-01-05 LAB — CBC WITH DIFFERENTIAL/PLATELET
Basophils Absolute: 0 10*3/uL (ref 0.0–0.1)
Basophils Relative: 0 %
EOS PCT: 1 %
Eosinophils Absolute: 0 10*3/uL (ref 0.0–0.7)
HCT: 32 % — ABNORMAL LOW (ref 36.0–46.0)
Hemoglobin: 9.6 g/dL — ABNORMAL LOW (ref 12.0–15.0)
LYMPHS ABS: 2 10*3/uL (ref 0.7–4.0)
LYMPHS PCT: 27 %
MCH: 26.7 pg (ref 26.0–34.0)
MCHC: 30 g/dL (ref 30.0–36.0)
MCV: 88.9 fL (ref 78.0–100.0)
Monocytes Absolute: 0.6 10*3/uL (ref 0.1–1.0)
Monocytes Relative: 8 %
Neutro Abs: 4.8 10*3/uL (ref 1.7–7.7)
Neutrophils Relative %: 64 %
PLATELETS: 269 10*3/uL (ref 150–400)
RBC: 3.6 MIL/uL — AB (ref 3.87–5.11)
RDW: 17.1 % — ABNORMAL HIGH (ref 11.5–15.5)
WBC: 7.4 10*3/uL (ref 4.0–10.5)

## 2017-01-05 LAB — COMPREHENSIVE METABOLIC PANEL
ALT: 15 U/L (ref 14–54)
AST: 20 U/L (ref 15–41)
Albumin: 2.6 g/dL — ABNORMAL LOW (ref 3.5–5.0)
Alkaline Phosphatase: 82 U/L (ref 38–126)
Anion gap: 6 (ref 5–15)
BUN: 43 mg/dL — ABNORMAL HIGH (ref 6–20)
CALCIUM: 10.2 mg/dL (ref 8.9–10.3)
CO2: 24 mmol/L (ref 22–32)
CREATININE: 1.34 mg/dL — AB (ref 0.44–1.00)
Chloride: 112 mmol/L — ABNORMAL HIGH (ref 101–111)
GFR, EST AFRICAN AMERICAN: 41 mL/min — AB (ref >60)
GFR, EST NON AFRICAN AMERICAN: 35 mL/min — AB (ref >60)
Glucose, Bld: 107 mg/dL — ABNORMAL HIGH (ref 65–99)
Potassium: 4.2 mmol/L (ref 3.5–5.1)
Sodium: 142 mmol/L (ref 135–145)
TOTAL PROTEIN: 6.4 g/dL — AB (ref 6.5–8.1)
Total Bilirubin: 0.9 mg/dL (ref 0.3–1.2)

## 2017-01-05 LAB — GLUCOSE, CAPILLARY: Glucose-Capillary: 115 mg/dL — ABNORMAL HIGH (ref 65–99)

## 2017-01-05 LAB — MAGNESIUM: MAGNESIUM: 2.1 mg/dL (ref 1.7–2.4)

## 2017-01-05 LAB — PHOSPHORUS: PHOSPHORUS: 3 mg/dL (ref 2.5–4.6)

## 2017-01-05 MED ORDER — AMLODIPINE BESYLATE 5 MG PO TABS: 5.0000 mg | ORAL_TABLET | Freq: Every day | ORAL | Status: DC

## 2017-01-05 MED ORDER — HYDROCHLOROTHIAZIDE 25 MG PO TABS
25.0000 mg | ORAL_TABLET | Freq: Every day | ORAL | Status: DC
  Administered 2017-01-05 – 2017-01-06 (×2): 25 mg via ORAL
  Filled 2017-01-05: qty 1

## 2017-01-05 NOTE — Care Management Important Message (Signed)
Important Message  Patient Details  Name: Valerie Downs MRN: [id number] Date of Birth: 1930/02/05   Medicare Important Message Given:  Yes    Kerin Salen 01/05/2017, 2:27 [place] Message  Patient Details  Name: Valerie Downs MRN: [id number] Date of Birth: [birth year]   Medicare Important Message Given:  Yes    Kerin Salen 01/05/2017, 2:27 PM

## 2017-01-05 NOTE — Progress Notes (Signed)
PROGRESS NOTE  Valerie Downs  [id number] DOB: 1930/01/01 DOA: 01/01/2017 PCP: Mauricio Po, FNP  Brief Narrative:   Valerie Downs a 83 y.o.femalewith medical history significant of breast cancer (s/p mastectomy 1984), HTN, CHF, aortic insufficiency and mitral regurgitation presenting to the ER. With SOB. Pt reported she had left lung biopsy performed 12/31/16 and denied any complications s/p procedure but notes when she woke up at 8am she had worsening SOB. She also notes she had a productive cough for the past 3 days with intermittent midsternal CP for the past 3 days that typically occurred at night.  CT of chest done. Admitted for Symptomatic Anemia and Chest Pain with Pericardial Effusion.  Refused to have blood draws and refused care. Transferred from SDU to Gen Med Floor with Telemetry on 01/03/2017.  ECHO demonstrated only small pericardial effusion.    Assessment & Plan:   Principal Problem:   Symptomatic anemia Active Problems:   HYPERTENSION, BENIGN SYSTEMIC   Chest pain   Ovarian mass, left   Dyspnea   Acute renal failure (ARF) (HCC)   Right lower lobe lung mass   Fever   Elevated troponin   Pericardial effusion   Elevated brain natriuretic peptide (BNP) level  Symptomatic anemia, improved, hgb 6.9 on admission and after 1 unit PRBC, hgb improved to 9mg /dl and has been increasing since.   - Concerning for post-procedure hemorrhage s/p Bronchoscopy and EBUS -The only thing that happened in the interim was an endobronchial ultrasound with needle aspiration of pathologically enlarged subcarinal and right pretracheal lymph nodes. -Continue Ferrous gluconate 324 mg po Daily  Chest pain with Pericardial Effusion; unlikely Cardiac But likely Demand - troponin 0.23, likely elated to demand ischemia in the setting of severe anemia and acute renal failure (see below). --BNP - 2298 (1417 on 7/11).  -ECHO showed Low normal LV systolic function; severe LVH; biatrial  enlargement; moderate AI; mild MR and TR; severely elevatedpulmonary pressure; small pericardial effusion  - Cardiology feels pericardial effusion is insignificant with no Heymodynamic compromise -  Patient refusing lasix so will discontinue  Essential hypertension Start HCTZ  Acute Renal Failure; improving  -Creatinine 1.65 (1.05 on 7/12); Cr 1.36 -Repeat CMP in AM  Hyperkalemia, resolved with kayexelate, continue to hold ARB -Patient's K+ was 5.4;   Fever, Resolved -Per pulmonology, this can be a reactive process to the bronchoscopy and would not necessarily warrant antibiotics. -C/w Acetaminophen 650 mg po q6hprn  Malignancy, she appears to have 2 malignant processes - ovarian and lung - that may be related or separate. -Regardless, she has been unwilling to agree to several treatment options offered to her  - palliative/comfort care measures soon.  Palliative care consult placed - biopsy results benign  Generalized weakness -  PT recommending SNF -  SW consult placed.  DVT prophylaxis:  SCDs Code Status:  full Family Communication:  Patient alone Disposition Plan:  To SNF tomorrow   Consultants:   Cardiology  PCCM  Procedures:  Bronchoscopy and EBUS  Antimicrobials:  Anti-infectives    None       Subjective:  Feeling better today.  Amenable to SNF for a few days before going home.  Denies current chest pain.    Objective: Vitals:   01/04/17 1341 01/04/17 1958 01/05/17 0513 01/05/17 1413  BP: (!) 162/60 (!) 150/75 (!) 161/62 (!) 141/59  Pulse: 61 78 79 76  Resp: 20 18 (!) 22 (!) 22  Temp: 98.9 F (37.2 C) 98.1 F (36.7 C)  98.4 F (36.9 C) 98 F (36.7 C)  TempSrc: Oral Oral Oral Oral  SpO2: 99% 97% 96% 96%  Weight:      Height:        Intake/Output Summary (Last 24 hours) at 01/05/17 1648 Last data filed at 01/05/17 1316  Gross per 24 hour  Intake              483 ml  Output                0 ml  Net              483 ml   Filed  Weights   01/01/17 1456 01/02/17 0105  Weight: 51.7 kg (114 lb) 55.9 kg (123 lb 3.8 oz)    Examination:  General exam:  Adult female.  No acute distress.  Awake, alert, able to answer questions clearly HEENT:  NCAT, MMM Respiratory system: Clear to auscultation bilaterally Cardiovascular system: Regular rate and rhythm, normal 99991111. 2/6 systolic murmur.  Warm extremities Gastrointestinal system: Normal active bowel sounds, soft, nondistended, nontender. MSK:  Normal tone and bulk, no lower extremity edema Neuro:  Grossly intact but diffusely weak    Data Reviewed: I have personally reviewed following labs and imaging studies  CBC:  Recent Labs Lab 12/30/16 1632 01/01/17 1558 01/02/17 0355 01/02/17 1820 01/04/17 0724 01/05/17 0739  WBC 11.5* 6.6 8.0 7.9 9.5 7.4  NEUTROABS 7.4 3.6  --   --  6.9 4.8  HGB 10.1* 6.9* 9.0* 9.0* 9.5* 9.6*  HCT 28.5* 23.0* 29.2* 30.0* 31.3* 32.0*  MCV 87.9 91.3 89.8 89.6 89.7 88.9  PLT 129.0* 217 219 220 268 [id number]   Basic Metabolic Panel:  Recent Labs Lab 01/01/17 1558 01/02/17 0355 01/04/17 0724 01/05/17 0739  NA 138 140 143 142  K 5.3* 5.8* 5.4* 4.2  CL 110 111 114* 112*  CO2 23 22 21* 24  GLUCOSE 101* 121* 82 107*  BUN 32* 32* 52* 43*  CREATININE 1.65* 1.47* 1.36* 1.34*  CALCIUM 9.0 9.4 10.1 10.2  MG  --   --  2.4 2.1  PHOS  --   --  3.8 3.0   GFR: Estimated Creatinine Clearance: 24.7 mL/min (by C-G formula based on SCr of 1.34 mg/dL (H)). Liver Function Tests:  Recent Labs Lab 01/04/17 0724 01/05/17 0739  AST 20 20  ALT 16 15  ALKPHOS 85 82  BILITOT 0.7 0.9  PROT 6.5 6.4*  ALBUMIN 2.7* 2.6*   No results for input(s): LIPASE, AMYLASE in the last 168 hours. No results for input(s): AMMONIA in the last 168 hours. Coagulation Profile: No results for input(s): INR, PROTIME in the last 168 hours. Cardiac Enzymes:  Recent Labs Lab 01/01/17 1558 01/02/17 0355  TROPONINI 0.23* 0.22*   BNP (last 3 results) No  results for input(s): PROBNP in the last 8760 hours. HbA1C: No results for input(s): HGBA1C in the last 72 hours. CBG:  Recent Labs Lab 12/31/16 1617  GLUCAP 75   Lipid Profile: No results for input(s): CHOL, HDL, LDLCALC, TRIG, CHOLHDL, LDLDIRECT in the last 72 hours. Thyroid Function Tests: No results for input(s): TSH, T4TOTAL, FREET4, T3FREE, THYROIDAB in the last 72 hours. Anemia Panel: No results for input(s): VITAMINB12, FOLATE, FERRITIN, TIBC, IRON, RETICCTPCT in the last 72 hours. Urine analysis:    Component Value Date/Time   COLORURINE YELLOW 01/01/2017 1713   APPEARANCEUR CLEAR 01/01/2017 1713   LABSPEC 1.025 01/01/2017 1713   PHURINE 5.5 01/01/2017 1713   GLUCOSEU  NEGATIVE 01/01/2017 1713   HGBUR NEGATIVE 01/01/2017 1713   [place] 01/01/2017 1713   KETONESUR NEGATIVE 01/01/2017 1713   PROTEINUR 100 (A) 01/01/2017 1713   UROBILINOGEN 0.2 08/16/2014 0535   NITRITE NEGATIVE 01/01/2017 1713   LEUKOCYTESUR TRACE (A) 01/01/2017 1713   Sepsis Labs: @LABRCNTIP (procalcitonin:4,lacticidven:4)  ) Recent Results (from the past 240 hour(s))  Urine culture     Status: Abnormal   Collection Time: 01/01/17  5:13 PM  Result Value Ref Range Status   Specimen Description URINE, RANDOM  Final   Special Requests NONE  Final   Culture MULTIPLE SPECIES PRESENT, SUGGEST RECOLLECTION (A)  Final   Report Status 01/03/2017 FINAL  Final  MRSA PCR Screening     Status: None   Collection Time: 01/02/17 12:52 AM  Result Value Ref Range Status   MRSA by PCR NEGATIVE NEGATIVE Final    Comment:        The GeneXpert MRSA Assay (FDA approved for NASAL specimens only), is one component of a comprehensive MRSA colonization surveillance program. It is not intended to diagnose MRSA infection nor to guide or monitor treatment for MRSA infections.   Culture, blood (routine x 2)     Status: None (Preliminary result)   Collection Time: 01/02/17  3:55 AM  Result Value Ref  Range Status   Specimen Description BLOOD RIGHT ANTECUBITAL  Final   Special Requests BOTTLES DRAWN AEROBIC AND ANAEROBIC 6ML  Final   Culture   Final    NO GROWTH 3 DAYS Performed at [facility]    Report Status PENDING  Incomplete      Radiology Studies: Dg Chest Port 1 View  Result Date: 01/04/2017 CLINICAL DATA:  Shortness of breath EXAM: PORTABLE CHEST 1 VIEW COMPARISON:  Three days ago FINDINGS: Cardiopericardial enlargement and aortic tortuosity, stable. Mild basilar atelectasis. No air bronchogram or edema. No effusion or pneumothorax. Nodule behind the right hilum not seen radiographically. Postoperative left axilla. IMPRESSION: 1. Stable from 3 days prior. 2. Cardiomegaly without failure. Electronically Signed   By: Monte Fantasia M.D.   On: 01/04/2017 07:02     Scheduled Meds: . sodium chloride   Intravenous Once  . aspirin EC  325 mg Oral Daily  . ferrous gluconate  324 mg Oral Daily  . furosemide  20 mg Intravenous Q12H  . metoprolol tartrate  25 mg Oral BID  . sodium chloride flush  3 mL Intravenous Q12H   Continuous Infusions:   LOS: 4 days    Time spent: 30 min    Janece Canterbury, MD [facility] Pager [phone]  If 7PM-7AM, please contact night-coverage [email or web address] Password [email or web address] 01/05/2017, 4:48 PM

## 2017-01-05 NOTE — Evaluation (Signed)
Physical Therapy Evaluation Patient Details Name: Valerie Downs MRN: [id number] DOB: [birth year] Today's Date: 01/05/2017   History of Present Illness  Pt admitted through ED with resp distress and dx with sympromatic anemia.  Pt with hx of CHF, R lung mass, and breast CA s/p L mastectomy  Clinical Impression  Pt admitted as above and presenting with functional mobility limitations 2* generalized weakness, limited endurance, questionable safety awareness, and ambulatory balance deficits.  Pt is currently significant fall risk and would benefit from follow up rehab at SNF level to maximize IND and safety unless 24/7 assist available at home with follow up HHPT to further address deficits.    Follow Up Recommendations Home health PT;SNF;Supervision/Assistance - 24 hour    Equipment Recommendations  None recommended by PT    Recommendations for Other Services OT consult     Precautions / Restrictions Precautions Precautions: Fall Restrictions Weight Bearing Restrictions: No      Mobility  Bed Mobility Overal bed mobility: Modified Independent             General bed mobility comments: Increased time and use of rail to move to EOB  Transfers Overall transfer level: Needs assistance Equipment used: None Transfers: Sit to/from Stand Sit to Stand: Min assist         General transfer comment: to steady with initial standing  Ambulation/Gait Ambulation/Gait assistance: Min assist Ambulation Distance (Feet): 35 Feet (twice) Assistive device: 1 person hand held assist;Rolling walker (2 wheeled) Gait Pattern/deviations: Step-through pattern;Decreased step length - right;Decreased step length - left;Shuffle;Trunk flexed;Wide base of support Gait velocity: decr Gait velocity interpretation: Below normal speed for age/gender General Gait Details: Pt ambulated 13' sans assist device but requiring HHA for balance.  Pt ambulated additional 20 and 35' with RW, cues for posture and  position from RW and continued intermittent min assist for balance/stability  Stairs            Wheelchair Mobility    Modified Rankin (Stroke Patients Only)       Balance Overall balance assessment: Needs assistance Sitting-balance support: Feet supported;No upper extremity supported Sitting balance-Leahy Scale: Good     Standing balance support: No upper extremity supported Standing balance-Leahy Scale: Fair                               Pertinent Vitals/Pain Pain Assessment: No/denies pain    Home Living Family/patient expects to be discharged to:: Private residence Living Arrangements: Children Available Help at Discharge: Family;Available PRN/intermittently Type of Home: House Home Access: Level entry     Home Layout: One level Home Equipment: Walker - 2 wheels Additional Comments: Pt states dtr lives with her but works during the day    Prior Function Level of Independence: Independent         Comments: IND sans AD per pt?     Hand Dominance        Extremity/Trunk Assessment   Upper Extremity Assessment Upper Extremity Assessment: Generalized weakness    Lower Extremity Assessment Lower Extremity Assessment: Generalized weakness       Communication   Communication: No difficulties  Cognition Arousal/Alertness: Awake/alert Behavior During Therapy: Flat affect Overall Cognitive Status: No family/caregiver present to determine baseline cognitive functioning                      General Comments      Exercises     Assessment/Plan  PT Assessment Patient needs continued PT services  PT Problem List Decreased strength;Decreased activity tolerance;Decreased balance;Decreased mobility;Decreased knowledge of use of DME;Decreased cognition;Decreased safety awareness          PT Treatment Interventions DME instruction;Gait training;Functional mobility training;Therapeutic activities;Therapeutic exercise;Balance  training;Patient/family education    PT Goals (Current goals can be found in the Care Plan section)  Acute Rehab PT Goals Patient Stated Goal: Home PT Goal Formulation: With patient Time For Goal Achievement: 01/15/17 Potential to Achieve Goals: Fair    Frequency Min 3X/week   Barriers to discharge Decreased caregiver support Pt states dtr works during the day    Co-evaluation               End of Session Equipment Utilized During Treatment: Gait belt Activity Tolerance: Patient tolerated treatment well;Patient limited by fatigue Patient left: in chair;with call bell/phone within reach;with chair alarm set Nurse Communication: Mobility status         Time: 1510-1535 PT Time Calculation (min) (ACUTE ONLY): 25 min   Charges:   PT Evaluation $PT Eval Low Complexity: 1 Procedure PT Treatments $Gait Training: 8-22 mins   PT G Codes:        Dariya Gainer 01-Feb-2017, 3:54 PM

## 2017-01-06 DIAGNOSIS — R590 Localized enlarged lymph nodes: Secondary | ICD-10-CM

## 2017-01-06 DIAGNOSIS — I272 Pulmonary hypertension, unspecified: Secondary | ICD-10-CM

## 2017-01-06 DIAGNOSIS — Z7189 Other specified counseling: Secondary | ICD-10-CM

## 2017-01-06 MED ORDER — NYSTATIN 100000 UNIT/ML MT SUSP
5.0000 mL | Freq: Four times a day (QID) | OROMUCOSAL | Status: DC
  Administered 2017-01-06: 500000 [IU] via ORAL
  Filled 2017-01-06: qty 5

## 2017-01-06 MED ORDER — HYDROCHLOROTHIAZIDE 25 MG PO TABS: 25.0000 mg | ORAL_TABLET | Freq: Every day | ORAL | 0 refills | Status: DC

## 2017-01-06 MED ORDER — FERROUS GLUCONATE 240 (27 FE) MG PO TABS: 240.0000 mg | ORAL_TABLET | Freq: Two times a day (BID) | ORAL | 0 refills | Status: DC

## 2017-01-06 MED ORDER — NYSTATIN 100000 UNIT/ML MT SUSP: 5.0000 mL | Freq: Four times a day (QID) | OROMUCOSAL | 0 refills | Status: DC

## 2017-01-06 NOTE — Care Management Note (Signed)
Case Management Note  Patient Details  Name: Valerie Downs MRN: [id number] Date of Birth: 10/22/1930  Subjective/Objective:                  worsening SOB Action/Plan: Discharge planning Expected Discharge Date:   (unknown)               Expected Discharge Plan:  [place]  In-House Referral:     Discharge planning Services  CM Consult  Post Acute Care Choice:  Home Health Choice offered to:  Patient  DME Arranged:  3-N-1 DME Agency:  [place]:  RN, PT, OT [facility] Agency:  [place]  Status of Service:  Completed, signed off  If discussed at [place] of Stay Meetings, dates discussed:    Additional Comments: CM met with pt in room to confirm desire is to go home with home health and NOT hospice; pt confirms and states she will make palliative decision later at home with family.   Cm offered choice home health agency. Pt chooses [facility] to render HHPT/OT/RN. Refferral called to [facility] rep, Joelene Millin.  CM notified [facility] D[facility] rep, Larene Beach to please deliver the 3n1 to room prior to discharge.  Pt states she will have family member provide ride home (who can also take 3n1 home).  No other CM needs were communicated. Dellie Catholic, RN 01/06/2017, 2:08 PM

## 2017-01-06 NOTE — Progress Notes (Signed)
PCCM PROGRESS NOTE  Admission date: 01/01/2017 Consult date: 01/01/2017 Referring provider: Dr. Lorin Mercy  HPI: 83 yo female with hx of breast cancer, ovarian mass s/p EBUS for enlarging LAN.  She developed dyspnea, fever and anemia post procedure.  Subjective: Feels better.  Denies chest pain.  Breathing better.  Sometimes gets choked if she drinks water too fast.  Vital signs: BP (!) 148/64 (BP Location: Left Arm)   Pulse 77   Temp 97.6 F (36.4 C) (Oral)   Resp 17   Ht 5\' 2"  (1.575 m)   Wt 123 lb 3.8 oz (55.9 kg)   SpO2 99%   BMI 22.54 kg/m   General: thin Neuro: alert, normal strength Cardiac: regular, 2/6 SM Chest: no wheeze Abd: soft, non tender Ext: no edema, changes of OA in her hands Skin: no rashes   CMP Latest Ref Rng & Units 01/05/2017 01/04/2017 01/02/2017  Glucose 65 - 99 mg/dL 107(H) 82 121(H)  BUN 6 - 20 mg/dL 43(H) 52(H) 32(H)  Creatinine 0.44 - 1.00 mg/dL 1.34(H) 1.36(H) 1.47(H)  Sodium 135 - 145 mmol/L 142 143 140  Potassium 3.5 - 5.1 mmol/L 4.2 5.4(H) 5.8(H)  Chloride 101 - 111 mmol/L 112(H) 114(H) 111  CO2 22 - 32 mmol/L 24 21(L) 22  Calcium 8.9 - 10.3 mg/dL 10.2 10.1 9.4  Total Protein 6.5 - 8.1 g/dL 6.4(L) 6.5 -  Total Bilirubin 0.3 - 1.2 mg/dL 0.9 0.7 -  Alkaline Phos 38 - 126 U/L 82 85 -  AST 15 - 41 U/L 20 20 -  ALT 14 - 54 U/L 15 16 -    CBC Latest Ref Rng & Units 01/05/2017 01/04/2017 01/02/2017  WBC 4.0 - 10.5 K/uL 7.4 9.5 7.9  Hemoglobin 12.0 - 15.0 g/dL 9.6(L) 9.5(L) 9.0(L)  Hematocrit 36.0 - 46.0 % 32.0(L) 31.3(L) 30.0(L)  Platelets 150 - 400 K/uL 269 268 220    Studies: Bronchoscopy 12/31/16 >> atypical cells 4R node, benign tissue 7 node CT angio chest 01/01/17 >> pericardial effusion, 3.2 cm mass RLL, 5 mm nodule Lt apex Echo 01/02/17 >> severe LVH, EF 50 to 55%, mod AR, mild MR, severe LA dilation, mod RA dilation, PAS 83 mmHg, small pericardial effusion  Assessment/plan:  Enlarging LAN with hx of multiple malignancies. - EBUS results  inconclusive - I had lengthy d/w her about EBUS results and still possibility of malignancy >> pt has declined further evaluation of this - palliative care consulted  PCCM will sign off.  Please call if additional help needed.  Chesley Mires, MD [facility] Pulmonary/Critical Care 01/06/2017, 9:16 AM Pager:  [phone] After 3pm call: [phone]

## 2017-01-06 NOTE — Consult Note (Signed)
Consultation Note Date: 01/06/2017   Patient Name: Valerie Downs  DOB: 1930/11/19  MRN: [id number]  Age / Sex: 83 y.o., female  PCP: Valerie Circle, FNP Referring Physician: Janece Canterbury, MD  Reason for Consultation: Establishing goals of care  HPI/Patient Profile: 83 y.o. female  with past medical history of breast cancer, HTN, CHF, aortic insufficiency who was admitted on 01/01/2017 with SOB.  On admission she was found to be severely anemic and was also complaining of chest pain. Work-up with CT chest noted a large pericardial effusion, however on ECHO it was small. Anemia improved with transfusion.  Cardiology and Pulmonology also followed. Concurrent to the above issues, she was also recently was diagnosed with a RLL lung nodule and ovarian lesion (both dx w CT on 07/06/16 and confirmed with subsequent PET scan).  Subsequent lung needle aspiration was inconclusive.  CA 125 markedly elevated. After discussion with multiple providers she has elected to not pursue further work-up of either of these masses. She is now also refusing further blood draws and care here, and is scheduled for discharge today.   Clinical Assessment and Goals of Care: Palliative NP Valerie Downs and I met the patient lying comfortably in bed for our conversation.  She denies any pain or discomfort, difficulty sleeping, constipation or dysuria.  She has no concerns with her hospital stay thus far.    We then discussed her extensive family, including multiple grand- and great-grandchildren.  She stated her family is what's most important to her.  Valerie Downs lives with her son-Valerie Downs- and daughter-in-law in [place].    When asked about her current health conditions and her choice to forego further evaluation and care of her lung and ovarian masses. She explained that she does not believe the findings are cancerous and therefore does not want to  pursue treatment. We did address the clinical clues to believe it was a malignancy, however she remained uncertain. Her top priority now is to return home and to be comfortable. She wants to avoid suffering at all cost, which she considers to mean being in pain. She was open to learning about hospice care and is interested in considering the option in the future, however wants to further discuss it with her family.   In brief, her overall goals of care are to remain comfortable, avoid invasive procedures, and spend meaningful time with her family. We assisted her in filling out HCPOA paperwork, wherein she designated her son Valerie Downs) and daughter-in-law Valerie Downs) as her decision makers if she were unable. We also talked about code status, which she was open to discuss but wished to talk with her family further before making a decision. In the future, I would recommend family is present for these conversations as she is clearly reliant on their input and support.   Primary Decision Maker:  PATIENT   SUMMARY OF RECOMMENDATIONS    Full code, pt wishes to be discharged home with no further testing or evaluation  CM assisted in setting up home health and social  worker to continue conversations about code status  HCPOA paperwork filled out, chaplain consulted to finish and notarize  Code Status/Advance Care Planning:  Full code    Symptom Management:   Oral Candidiasis - Nystatin swish and swallow  Additional Recommendations (Limitations, Scope, Preferences):  Patient does not wish to be further evaluated for her current conditions, but has not yet determined Code or Comfort Care status   Psycho-social/Spiritual:   Desire for further Chaplaincy support:  Prognosis:   Unable to determine, but likely <6 months due to two areas of likely malignancy on her lung and ovary that she is not willing to pursue treatment for.  Discharge Planning: Home with Home Health      Primary  Diagnoses: Present on Admission: . Symptomatic anemia . Acute renal failure (ARF) ([place]) . Chest pain . Dyspnea . HYPERTENSION, BENIGN SYSTEMIC . Right lower lobe lung mass . Ovarian mass, left . Fever . Elevated troponin . Pericardial effusion . Elevated brain natriuretic peptide (BNP) level   I have reviewed the medical record, interviewed the patient and family, and examined the patient. The following aspects are pertinent.  Past Medical History:  Diagnosis Date  . Aortic insufficiency   . Blood transfusion without reported diagnosis   . Breast cancer ([place])   . Cardiomyopathy ([place])   . CHF (congestive heart failure) ([place])   . Hypertension   . Mitral regurgitation    Social History   Social History  . Marital status: Widowed    Spouse name: N/A  . Number of children: 10  . Years of education: 5   Occupational History  . Retired    Social History Main Topics  . Smoking status: Current Some Day Smoker    Packs/day: 0.25    Years: 50.00    Types: Cigarettes  . Smokeless tobacco: Former Systems developer    Types: Snuff     Comment: per patient has not used snuff in a while (10/18/16)  . Alcohol use No  . Drug use: No  . Sexual activity: No   Other Topics Concern  . None   Social History Narrative   Denies abuse and feels safe at home.    Family History  Problem Relation Age of Onset  . Hypertension Mother   . Cancer Father    Scheduled Meds: . sodium chloride   Intravenous Once  . aspirin EC  325 mg Oral Daily  . ferrous gluconate  324 mg Oral Daily  . hydrochlorothiazide  25 mg Oral Daily  . metoprolol tartrate  25 mg Oral BID  . sodium chloride flush  3 mL Intravenous Q12H   Continuous Infusions: PRN Meds:.acetaminophen **OR** acetaminophen, hydrALAZINE, ondansetron **OR** ondansetron (ZOFRAN) IV Allergies  Allergen Reactions  . Penicillins Anaphylaxis and Swelling    Has patient had a PCN reaction causing immediate rash, facial/tongue/throat swelling, SOB  or lightheadedness with hypotension: Yes Has patient had a PCN reaction causing severe rash involving mucus membranes or skin necrosis: Yes Has patient had a PCN reaction that required hospitalization Yes Has patient had a PCN reaction occurring within the last 10 years: No If all of the above answers are "NO", then may proceed with Cephalosporin use.    Review of Systems  HENT: Negative for trouble swallowing.   Respiratory: Negative for shortness of breath.   Cardiovascular: Negative for chest pain.  Gastrointestinal: Negative for abdominal pain, constipation, diarrhea and nausea.  Genitourinary: Negative for difficulty urinating and dysuria.  Musculoskeletal: Negative for gait problem.  Neurological: Negative for speech difficulty.  Psychiatric/Behavioral: Negative for sleep disturbance. The patient is not nervous/anxious.      Physical Exam  Constitutional: She is oriented to person, place, and time. She appears well-developed and well-nourished. No distress.  HENT:  Head: Normocephalic and atraumatic.  Mouth/Throat: Mucous membranes are dry. Oropharyngeal exudate present.  Eyes: EOM are normal.  Neck: Normal range of motion.  Cardiovascular: Normal rate and regular rhythm.   Pulmonary/Chest: Effort normal. She has wheezes (mild wheeze RUL/LUL).  Abdominal: Soft. Bowel sounds are normal.  Musculoskeletal: Normal range of motion.  Neurological: She is alert and oriented to person, place, and time.  Skin: Skin is warm and dry. There is pallor.  Psychiatric: She has a normal Downs and affect. Her behavior is normal. Judgment and thought content normal.     Vital Signs: BP (!) 148/64 (BP Location: Left Arm)   Pulse 77   Temp 97.6 F (36.4 C) (Oral)   Resp 17   Ht 5' 2"  (1.575 m)   Wt 55.9 kg (123 lb 3.8 oz)   SpO2 99%   BMI 22.54 kg/m  Pain Assessment: 0-10 POSS *See Group Information*: S-Acceptable,Sleep, easy to arouse Pain Score: 0-No pain   SpO2: SpO2: 99 % O2  Device:SpO2: 99 % O2 Flow Rate: .O2 Flow Rate (L/min): 2 L/min  IO: Intake/output summary:  Intake/Output Summary (Last 24 hours) at 01/06/17 1254 Last data filed at 01/06/17 0933  Gross per 24 hour  Intake              970 ml  Output                0 ml  Net              970 ml    LBM: Last BM Date: 01/02/17; pt reported last BM was 1/10 Baseline Weight: Weight: 51.7 kg (114 lb) Most recent weight: Weight: 55.9 kg (123 lb 3.8 oz)       Time Total: 80 Greater than 50%  of this time was spent counseling and coordinating care related to the above assessment and plan.  Signed by: Olene Floss, PA-S  Please contact Palliative Medicine Team phone at [phone] for questions and concerns.  For individual provider: See Shea Evans

## 2017-01-06 NOTE — Discharge Summary (Addendum)
Physician Discharge Summary  Valerie Downs JJH:[id number] DOB: [birth year] DOA: 01/01/2017  PCP: Mauricio Po, FNP  Admit date: 01/01/2017 Discharge date: 01/11/2017  Admitted From: home  Disposition:  home  Recommendations for Outpatient Follow-up:  1. Follow up with PCP in 1-2 weeks.  Outpatient palliative care consult or referral to hospice when patient is ready 2. Please obtain BMP/CBC in 1-2 weeks 3. Cardiology, pulmonology and oncology if patient prefers to pursue further work up or treatment 4. Follow up:  Pending blood culture  Home Health:  RN, PT/OT  Equipment/Devices:  none  Discharge Condition:  Stable, improved CODE STATUS:  full  Diet recommendation:  regular   Brief/Interim Summary:  Valerie Downs a 83 y.o.femalewith medical history significant of breast cancer (s/p mastectomy 1984), HTN, CHF, aortic insufficiency and mitral regurgitation presenting to the ER. With SOB. Pt reported she had left lung biopsy performed 12/31/16 and denied any complications s/p procedure but notes when she woke up at 8am she had worsening SOB. She also noted a productive cough with intermittent midsternal CP for 3 days, worse at night.  CT of chest demonstrated what appeared to be a large pericardial effusion. Admitted for Symptomatic Anemia and Chest Pain with Pericardial Effusion, however, ECHO demonstrated only a small pericardial effusion.  She was also anemic. She refused to have blood draws and refused cares.  Her biopsy demonstrated atypical but benign cells and was inconclusive.  She was seen by cardiology and by pulmonology but declined further work up.  Recommended SNF for Valerie Downs term rehabilitation due to generalized weakness, however, she declined and preferred to go home with her son with home health services.    Discharge Diagnoses:  Principal Problem:   Symptomatic anemia Active Problems:   HYPERTENSION, BENIGN SYSTEMIC   Chest pain   Ovarian mass, left   Dyspnea   Acute  renal failure (ARF) (HCC)   Right lower lobe lung mass   Fever   Elevated troponin   Pericardial effusion   Elevated brain natriuretic peptide (BNP) level   Goals of care, counseling/discussion   Moderate to severe pulmonary hypertension   Acute on chronic diastolic heart failure (HCC)  Symptomatic anemia, likely iron deficiency anemia as iron level 25 (L), improved, hgb 6.9 on admission and after 1 unit PRBC, hgb improved to 27m/dl and has been increasing since.  folate and vitamin B12 levels were normal a few months ago.   - Occult stool negative - Concerning for post-procedure hemorrhage s/p Bronchoscopy and EBUS -Continue Ferrous gluconate but increase to BID  Chest pain with Pericardial Effusion - troponin 0.23, likely elated to demand ischemia in the setting of severe anemia and acute renal failure (see below). - Cardiology feels trivial pericardial effusion is insignificant with no Hemodymodynamic compromise based on ECHO  Severe pulmonary hypertension with acute on chronic diastolic heart failure --BNP - 2298 (1417 on 7/11).  - ECHO showed Low normal LV systolic function; severe LVH; biatrial enlargement; moderate AI; mild MR and TR; severely elevatedpulmonary pressure - she was diuresed with lasix 236mIV - started on HCTZ as below  Essential hypertension Started HCTZ  Acute Renal Failure; improving  -Creatinine 1.65 (1.05 on 7/12); Cr 1.36 -Repeat CMP in AM  Hyperkalemia, resolved with kayexelate, continue to hold ARB -Patient's K+ was 5.4  Fever, Resolved -Per pulmonology, this can be a reactive process to the bronchoscopy and would not necessarily warrant antibiotics. 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 -Regardless, she has been unwilling to agree to several treatment options offered to her  - palliative/comfort care measures soon.  Palliative care  consult placed - biopsy results benign/inconclusive  Generalized weakness -  PT recommending SNF -  SW met with patient to discuss options, but patient declined and preferred to go home with her son  Discharge Instructions  Discharge Instructions    Call MD for:  difficulty breathing, headache or visual disturbances    Complete by:  As directed    Call MD for:  extreme fatigue    Complete by:  As directed    Call MD for:  hives    Complete by:  As directed    Call MD for:  persistant dizziness or light-headedness    Complete by:  As directed    Call MD for:  persistant nausea and vomiting    Complete by:  As directed    Call MD for:  severe uncontrolled pain    Complete by:  As directed    Call MD for:  temperature >100.4    Complete by:  As directed    Diet general    Complete by:  As directed    Increase activity slowly    Complete by:  As directed        Medication List    STOP taking these medications   losartan 25 MG tablet Commonly known as:  COZAAR     TAKE these medications   aspirin EC 325 MG tablet Take 325 mg by mouth daily.   CENTRUM SILVER 50+WOMEN Tabs Take 1 tablet by mouth daily.   ferrous gluconate 240 (27 FE) MG tablet Commonly known as:  IRON 27 Take 1 tablet (240 mg total) by mouth 2 (two) times daily. What changed:  when to take this   hydrochlorothiazide 25 MG tablet Commonly known as:  HYDRODIURIL Take 1 tablet (25 mg total) by mouth daily.   metoprolol tartrate 25 MG tablet Commonly known as:  LOPRESSOR Take 1 tablet (25 mg total) by mouth 2 (two) times daily.   nystatin 100000 UNIT/ML suspension Commonly known as:  MYCOSTATIN Take 5 mLs (500,000 Units total) by mouth 4 (four) times daily.      Follow-up Information    Mauricio Po, FNP. Schedule an appointment as soon as possible for a visit in 1 week(s).   Specialty:  Family Medicine Contact information: [place] [address] [phone]        [facility]. Schedule an appointment as soon as possible for a visit.   Why:  if interested in pursuing additional work up for demand ischemia Contact information: [place] [address] [phone]       Donaciano Eva, MD Follow up.   Specialty:  Obstetrics and Gynecology Why:  if you want to pursue work up or treatment for cancer Contact information: [place] [address] [phone]        Christinia Gully, MD Follow up.   Specialty:  Pulmonary Disease Why:  as needed if you want to try another biopsy Contact information: [address] [place] [address] [phone]        [facility] Follow up.   Why:  [facility]n[address] Contact information: [address] [place] [address] [phone]  [place] Follow up.   Why:  home health physical and occupational therapy and nurse Contact information: [place] [address] [phone]          Allergies  Allergen Reactions  . Penicillins Anaphylaxis and Swelling    Has patient had a PCN reaction causing immediate rash, facial/tongue/throat swelling, SOB or lightheadedness with hypotension: Yes Has patient had a PCN reaction causing severe rash involving mucus membranes or skin necrosis: Yes Has patient had a PCN reaction that required hospitalization Yes Has patient had a PCN reaction occurring within the last 10 years: No If all of the above answers are "NO", then may proceed with Cephalosporin use.     Consultations: Cardiology Pulmonology   Procedures/Studies: Ct Angio Chest Pe W And/or Wo Contrast  Result Date: 01/01/2017 CLINICAL DATA:  Right lung biopsy yesterday.  Respiratory distress. EXAM: CT ANGIOGRAPHY CHEST WITH CONTRAST TECHNIQUE: Multidetector CT imaging of the chest was performed using the standard protocol during  bolus administration of intravenous contrast. Multiplanar CT image reconstructions and MIPs were obtained to evaluate the vascular anatomy. CONTRAST:  80 cc of Isovue 370 COMPARISON:  None. FINDINGS: Cardiovascular: Cardiomegaly is identified. Coronary artery calcifications are noted. The ascending thoracic aorta measures 4.7 cm in AP dimension on series 4, image 46 unchanged in the interval. No dissection. No pulmonary emboli. Mediastinum/Nodes: Small bilateral pleural effusions. There is a pericardial effusion posteriorly which was not present on the previous PET-CT measuring 2 cm in thickness on series 4, image 63. A mildly prominent node to the right of the aorta on series 4, image 76 is stable. The node anterior to the trachea on series 4, image 33 is more prominent in the interval. The pretracheal node on image 30 is unchanged. No other change in mediastinal nodes. No other adenopathy elsewhere in the chest. The esophagus is normal. Lungs/Pleura: The central airways are unchanged and unremarkable. No pneumothorax. The known nodule just posterior to the right lower lobe bronchus measures 3.2 x 2.9 cm today versus 2.5 by 2.3 cm on the September 08, 2016 PET-CT. There is a small nodule in the left apex on series 11, image 16 not seen on the comparison PET-CT measuring 5 mm today. No other new pulmonary nodules. No overt edema. Small bilateral pleural effusions are identified with associated atelectasis. Upper Abdomen: No acute abnormality. Musculoskeletal: No chest wall abnormality. No acute or significant osseous findings. Review of the MIP images confirms the above findings. IMPRESSION: 1. No pulmonary emboli. 2. The patient's known malignancy in the medial right lower lobe posterior to the right lower lobe bronchus is larger in the interval. A pre tracheal node is more prominent in the interval. No other change within visualized lymph nodes. 3. New pericardial effusion posteriorly only measuring 2 cm. New  small pleural effusions. 4. New small nodule in the left apex. Recommend attention on follow-up. 5. **An incidental finding of potential clinical significance has been found. The ascending thoracic aorta measures 4.7 cm which is stable. Ascending thoracic aortic aneurysm. Recommend semi-annual imaging followup by CTA or MRA and referral to cardiothoracic surgery if not already obtained. This recommendation follows 2010 ACCF/AHA/AATS/ACR/ASA/SCA/SCAI/SIR/STS/SVM Guidelines for the Diagnosis and Management of Patients With Thoracic Aortic Disease. Circulation. 2010; 121: V859-Y924** Electronically Signed   By: Dorise Bullion III M.D   On: 01/01/2017 19:30   Dg Chest Port 1 View  Result Date: 01/04/2017 CLINICAL DATA:  Shortness of breath EXAM: PORTABLE CHEST 1 VIEW COMPARISON:  Three days ago FINDINGS: Cardiopericardial enlargement and aortic tortuosity, stable. Mild basilar atelectasis. No air bronchogram or edema. No effusion or pneumothorax. Nodule behind the right hilum not seen radiographically. Postoperative left axilla. IMPRESSION: 1. Stable from 3 days prior. 2. Cardiomegaly without failure. Electronically Signed   By: Monte Fantasia M.D.   On: 01/04/2017 07:02   Dg Chest Port 1 View  Result Date: 01/01/2017 CLINICAL DATA:  Cough, shortness of breath recent lung biopsy EXAM: PORTABLE CHEST 1 VIEW COMPARISON:  09/16/2016, 09/08/2016 FINDINGS: Stable cardiomegaly and vascular congestion. Postop changes in the left axilla. No superimposed CHF, pneumonia, collapse or consolidation. No large effusion or pneumothorax. Degenerative changes of the spine. Aorta is atherosclerotic and tortuous. Trachea is midline. Bones are osteopenic. Known posterior right hilar nodule is not visualized by plain radiography. IMPRESSION: Stable cardiomegaly and vascular congestion. No superimposed acute process. Right hilar nodule posteriorly is better demonstrated by recent PET-CT and not visualized by plain radiography.  Electronically Signed   By: Jerilynn Mages.  Shick M.D.   On: 01/01/2017 15:18    Subjective: Feeling well.  Denies shortness of breath.  Still has intermittent chest pressure.    Discharge Exam: Vitals:   01/06/17 0410 01/06/17 1500  BP: (!) 148/64 (!) 145/72  Pulse: 77 78  Resp: 17 18  Temp: 97.6 F (36.4 C) 97.9 F (36.6 C)   Vitals:   01/05/17 1413 01/05/17 2033 01/06/17 0410 01/06/17 1500  BP: (!) 141/59 (!) 150/67 (!) 148/64 (!) 145/72  Pulse: 76 79 77 78  Resp: (!) _0 Temp: 98 F (36.7 C) 98.5 F (36.9 C) 97.6 F (36.4 C) 97.9 F (36.6 C)  TempSrc: Oral Oral Oral Oral  SpO2: 96% 97% 99% 100%  Weight:      Height:        General exam:  thin adult female.  No acute distress.  Awake, alert, able to answer questions clearly HEENT:  NCAT, MMM Respiratory system: Clear to auscultation bilaterally Cardiovascular system: Regular rate and rhythm, normal U1/L2. 2/6 systolic murmur.  Warm extremities Gastrointestinal system: Normal active bowel sounds, soft, nondistended, nontender. MSK:  Normal tone and bulk, no lower extremity edema Neuro:  Grossly intact but diffusely weak    The results of significant diagnostics from this hospitalization (including imaging, microbiology, ancillary and laboratory) are listed below for reference.     Microbiology: Recent Results (from the past 240 hour(s))  MRSA PCR Screening     Status: None   Collection Time: 01/02/17 12:52 AM  Result Value Ref Range Status   MRSA by PCR NEGATIVE NEGATIVE Final    Comment:        The GeneXpert MRSA Assay (FDA approved for NASAL specimens only), is one component of a comprehensive MRSA colonization surveillance program. It is not intended to diagnose MRSA infection nor to guide or monitor treatment for MRSA infections.   Culture, blood (routine x 2)     Status: None   Collection Time: 01/02/17  3:55 AM  Result Value Ref Range Status   Specimen Description BLOOD RIGHT ANTECUBITAL  Final    Special Requests BOTTLES DRAWN AEROBIC AND ANAEROBIC 6ML  Final   Culture   Final    NO GROWTH 5 DAYS Performed at [facility]    Report Status 01/08/2017 FINAL  Final     Labs: BNP (last 3 results)  Recent Labs  06/08/16 1547 07/06/16 1954 01/01/17 1551  BNP 1,011.3* 1,416.9* 4,401.0*   Basic Metabolic Panel:  Recent Labs Lab 01/05/17 0739  NA 142  K 4.2  CL 112*  CO2 24  GLUCOSE 107*  BUN 43*  CREATININE 1.34*  CALCIUM 10.2  MG 2.1  PHOS 3.0   Liver Function Tests:  Recent Labs Lab 01/05/17 0739  AST 20  ALT 15  ALKPHOS 82  BILITOT 0.9  PROT 6.4*  ALBUMIN 2.6*   No results for input(s): LIPASE, AMYLASE in the last 168 hours. No results for input(s): AMMONIA in the last 168 hours. CBC:  Recent Labs Lab 01/05/17 0739  WBC 7.4  NEUTROABS 4.8  HGB 9.6*  HCT 32.0*  MCV 88.9  PLT 269   Cardiac Enzymes: No results for input(s): CKTOTAL, CKMB, CKMBINDEX, TROPONINI in the last 168 hours. BNP: Invalid input(s): POCBNP CBG:  Recent Labs Lab 01/05/17 2235  GLUCAP 115*   D-Dimer No results for input(s): DDIMER in the last 72 hours. Hgb A1c No results for input(s): HGBA1C in the last 72 hours. Lipid Profile No results for input(s): CHOL, HDL, LDLCALC, TRIG, CHOLHDL, LDLDIRECT in the last 72 hours. Thyroid function studies No results for input(s): TSH, T4TOTAL, T3FREE, THYROIDAB in the last 72 hours.  Invalid input(s): FREET3 Anemia work up No results for input(s): VITAMINB12, FOLATE, FERRITIN, TIBC, IRON, RETICCTPCT in the last 72 hours. Urinalysis    Component Value Date/Time   COLORURINE YELLOW 01/01/2017 1713   APPEARANCEUR CLEAR 01/01/2017 1713   LABSPEC 1.025 01/01/2017 1713   PHURINE 5.5 01/01/2017 1713   GLUCOSEU NEGATIVE 01/01/2017 1713   HGBUR NEGATIVE 01/01/2017 1713   BILIRUBINUR NEGATIVE 01/01/2017 1713   KETONESUR NEGATIVE 01/01/2017 1713   PROTEINUR 100 (A) 01/01/2017 1713   UROBILINOGEN 0.2 08/16/2014 0535    NITRITE NEGATIVE 01/01/2017 1713   LEUKOCYTESUR TRACE (A) 01/01/2017 1713   Sepsis Labs Invalid input(s): PROCALCITONIN,  WBC,  LACTICIDVEN   Time coordinating discharge: Over 30 minutes  SIGNED:   Janece Canterbury, MD  [facility] 01/11/2017, 7:50 PM Pager   If 7PM-7AM, please contact night-coverage [email or web address] Password [email or web address]

## 2017-01-06 NOTE — Progress Notes (Signed)
LCSWA met with patient at bedside, explained reason for consult-rehab intervention before going home. Patient reports at this time she would like to go home with homehealth services. Patient reports she lives with her son and daughter in law. The patient reports her son is at home most of the day to help with care.  LCSWA informed RNCM and physician. [place] signing off at this time.

## 2017-01-06 NOTE — Progress Notes (Signed)
Chaplain notarized Forensic scientist.  Pt demonstrated orientation and competency in front of witnesses.  Pt chose not to complete living will portion of Adv. Dir.    Copy of HCPOA placed in pt chart on unit.   Pt with original and copies.    [place], [place]

## 2017-01-07 ENCOUNTER — Telehealth: Payer: Self-pay

## 2017-01-07 NOTE — Telephone Encounter (Signed)
Transition Care Management Follow-up Telephone Call   Date discharged? 01/06/2017  How have you been since you were released from the hospital? Pt is feeling better.   Do you understand why you were in the hospital? Pt stated that she understands she has SOB but not sure why she does.   Do you understand the discharge instructions? Pt stated that she understands.   Where were you discharged to? HOME  Items Reviewed:  Medications reviewed: Yes and pt stated that she understands that the losartan was discontinued.   Allergies reviewed: No new drug allergies  Dietary changes reviewed: Yes  Referrals reviewed: No new referrals.  Functional Questionnaire:   Activities of Daily Living (ADLs):   States they are independent in the following: States she is independent in all ADLs but dtr is there is she needs any assistance.  States they require assistance with the following: N/A  Any transportation issues/concerns? Not at this time.   Any patient concerns? Not at this time.   Confirmed importance and date/time of follow-up visits scheduled: Day and time scheduled and confirmed with patient.   Provider Appointment booked with PCP on Thursday 01/13/2017 at 4:30pm  Confirmed with patient if condition begins to worsen call PCP or go to the ER.  Patient was given the office number and encouraged to call back with question or concerns: Yes

## 2017-01-08 LAB — CULTURE, BLOOD (ROUTINE X 2): CULTURE: NO GROWTH

## 2017-01-09 DIAGNOSIS — I509 Heart failure, unspecified: Secondary | ICD-10-CM | POA: Diagnosis not present

## 2017-01-09 DIAGNOSIS — Z7982 Long term (current) use of aspirin: Secondary | ICD-10-CM | POA: Diagnosis not present

## 2017-01-09 DIAGNOSIS — Z853 Personal history of malignant neoplasm of breast: Secondary | ICD-10-CM | POA: Diagnosis not present

## 2017-01-09 DIAGNOSIS — I313 Pericardial effusion (noninflammatory): Secondary | ICD-10-CM | POA: Diagnosis not present

## 2017-01-09 DIAGNOSIS — I351 Nonrheumatic aortic (valve) insufficiency: Secondary | ICD-10-CM | POA: Diagnosis not present

## 2017-01-09 DIAGNOSIS — R918 Other nonspecific abnormal finding of lung field: Secondary | ICD-10-CM | POA: Diagnosis not present

## 2017-01-09 DIAGNOSIS — R1909 Other intra-abdominal and pelvic swelling, mass and lump: Secondary | ICD-10-CM | POA: Diagnosis not present

## 2017-01-09 DIAGNOSIS — D649 Anemia, unspecified: Secondary | ICD-10-CM | POA: Diagnosis not present

## 2017-01-09 DIAGNOSIS — I340 Nonrheumatic mitral (valve) insufficiency: Secondary | ICD-10-CM | POA: Diagnosis not present

## 2017-01-09 DIAGNOSIS — I110 Hypertensive heart disease with heart failure: Secondary | ICD-10-CM | POA: Diagnosis not present

## 2017-01-09 DIAGNOSIS — N179 Acute kidney failure, unspecified: Secondary | ICD-10-CM | POA: Diagnosis not present

## 2017-01-09 DIAGNOSIS — I429 Cardiomyopathy, unspecified: Secondary | ICD-10-CM | POA: Diagnosis not present

## 2017-01-11 DIAGNOSIS — I509 Heart failure, unspecified: Secondary | ICD-10-CM | POA: Diagnosis not present

## 2017-01-11 DIAGNOSIS — I429 Cardiomyopathy, unspecified: Secondary | ICD-10-CM | POA: Diagnosis not present

## 2017-01-11 DIAGNOSIS — I272 Pulmonary hypertension, unspecified: Secondary | ICD-10-CM

## 2017-01-11 DIAGNOSIS — I340 Nonrheumatic mitral (valve) insufficiency: Secondary | ICD-10-CM | POA: Diagnosis not present

## 2017-01-11 DIAGNOSIS — I110 Hypertensive heart disease with heart failure: Secondary | ICD-10-CM | POA: Diagnosis not present

## 2017-01-11 DIAGNOSIS — I5032 Chronic diastolic (congestive) heart failure: Secondary | ICD-10-CM

## 2017-01-11 DIAGNOSIS — Z853 Personal history of malignant neoplasm of breast: Secondary | ICD-10-CM | POA: Diagnosis not present

## 2017-01-11 DIAGNOSIS — I313 Pericardial effusion (noninflammatory): Secondary | ICD-10-CM | POA: Diagnosis not present

## 2017-01-11 DIAGNOSIS — Z7982 Long term (current) use of aspirin: Secondary | ICD-10-CM | POA: Diagnosis not present

## 2017-01-11 DIAGNOSIS — I351 Nonrheumatic aortic (valve) insufficiency: Secondary | ICD-10-CM | POA: Diagnosis not present

## 2017-01-11 DIAGNOSIS — R1909 Other intra-abdominal and pelvic swelling, mass and lump: Secondary | ICD-10-CM | POA: Diagnosis not present

## 2017-01-11 DIAGNOSIS — N179 Acute kidney failure, unspecified: Secondary | ICD-10-CM | POA: Diagnosis not present

## 2017-01-11 DIAGNOSIS — R918 Other nonspecific abnormal finding of lung field: Secondary | ICD-10-CM | POA: Diagnosis not present

## 2017-01-11 DIAGNOSIS — D649 Anemia, unspecified: Secondary | ICD-10-CM | POA: Diagnosis not present

## 2017-01-12 DIAGNOSIS — I313 Pericardial effusion (noninflammatory): Secondary | ICD-10-CM | POA: Diagnosis not present

## 2017-01-12 DIAGNOSIS — R918 Other nonspecific abnormal finding of lung field: Secondary | ICD-10-CM | POA: Diagnosis not present

## 2017-01-12 DIAGNOSIS — R260 Ataxic gait: Secondary | ICD-10-CM | POA: Diagnosis not present

## 2017-01-13 ENCOUNTER — Inpatient Hospital Stay: Payer: Medicare Other | Admitting: Family

## 2017-01-13 DIAGNOSIS — Z7982 Long term (current) use of aspirin: Secondary | ICD-10-CM | POA: Diagnosis not present

## 2017-01-13 DIAGNOSIS — I351 Nonrheumatic aortic (valve) insufficiency: Secondary | ICD-10-CM | POA: Diagnosis not present

## 2017-01-13 DIAGNOSIS — I313 Pericardial effusion (noninflammatory): Secondary | ICD-10-CM | POA: Diagnosis not present

## 2017-01-13 DIAGNOSIS — Z853 Personal history of malignant neoplasm of breast: Secondary | ICD-10-CM | POA: Diagnosis not present

## 2017-01-13 DIAGNOSIS — N179 Acute kidney failure, unspecified: Secondary | ICD-10-CM | POA: Diagnosis not present

## 2017-01-13 DIAGNOSIS — D649 Anemia, unspecified: Secondary | ICD-10-CM | POA: Diagnosis not present

## 2017-01-13 DIAGNOSIS — I110 Hypertensive heart disease with heart failure: Secondary | ICD-10-CM | POA: Diagnosis not present

## 2017-01-13 DIAGNOSIS — R918 Other nonspecific abnormal finding of lung field: Secondary | ICD-10-CM | POA: Diagnosis not present

## 2017-01-13 DIAGNOSIS — I429 Cardiomyopathy, unspecified: Secondary | ICD-10-CM | POA: Diagnosis not present

## 2017-01-13 DIAGNOSIS — R1909 Other intra-abdominal and pelvic swelling, mass and lump: Secondary | ICD-10-CM | POA: Diagnosis not present

## 2017-01-13 DIAGNOSIS — I340 Nonrheumatic mitral (valve) insufficiency: Secondary | ICD-10-CM | POA: Diagnosis not present

## 2017-01-13 DIAGNOSIS — I509 Heart failure, unspecified: Secondary | ICD-10-CM | POA: Diagnosis not present

## 2017-01-14 DIAGNOSIS — I429 Cardiomyopathy, unspecified: Secondary | ICD-10-CM | POA: Diagnosis not present

## 2017-01-14 DIAGNOSIS — Z853 Personal history of malignant neoplasm of breast: Secondary | ICD-10-CM | POA: Diagnosis not present

## 2017-01-14 DIAGNOSIS — I110 Hypertensive heart disease with heart failure: Secondary | ICD-10-CM | POA: Diagnosis not present

## 2017-01-14 DIAGNOSIS — R918 Other nonspecific abnormal finding of lung field: Secondary | ICD-10-CM | POA: Diagnosis not present

## 2017-01-14 DIAGNOSIS — I509 Heart failure, unspecified: Secondary | ICD-10-CM | POA: Diagnosis not present

## 2017-01-14 DIAGNOSIS — D649 Anemia, unspecified: Secondary | ICD-10-CM | POA: Diagnosis not present

## 2017-01-14 DIAGNOSIS — I313 Pericardial effusion (noninflammatory): Secondary | ICD-10-CM | POA: Diagnosis not present

## 2017-01-14 DIAGNOSIS — I340 Nonrheumatic mitral (valve) insufficiency: Secondary | ICD-10-CM | POA: Diagnosis not present

## 2017-01-14 DIAGNOSIS — I351 Nonrheumatic aortic (valve) insufficiency: Secondary | ICD-10-CM | POA: Diagnosis not present

## 2017-01-14 DIAGNOSIS — R1909 Other intra-abdominal and pelvic swelling, mass and lump: Secondary | ICD-10-CM | POA: Diagnosis not present

## 2017-01-14 DIAGNOSIS — Z7982 Long term (current) use of aspirin: Secondary | ICD-10-CM | POA: Diagnosis not present

## 2017-01-14 DIAGNOSIS — N179 Acute kidney failure, unspecified: Secondary | ICD-10-CM | POA: Diagnosis not present

## 2017-01-16 NOTE — Progress Notes (Signed)
Subjective:    Patient ID: Valerie Downs, female    DOB: 1930/10/01, 83 y.o.   MRN: [id number]  Chief Complaint  Patient presents with  . Hospitalization Follow-up    still been having SOB    HPI:  Valerie Downs is a 83 y.o. female who  has a past medical history of Aortic insufficiency; Blood transfusion without reported diagnosis; Breast cancer ([place]); Cardiomyopathy [facility]); CHF (congestive heart failure) ([place]); Hypertension; and Mitral regurgitation. and presents today for a follow up office visit after hospitalization.   Recently evaluated in the emergency department with the chief complaint of shortness of breath and wheezing that was gradually worsening with concern secondary to lung biopsy the day prior to presentation. She also indicated a productive cough 3 days and intermittent midsternal chest pain typically worse at night when lying down. Describes the pain as a pressure sensation that lasted a few seconds and resolved spontaneously. He was noted to have metabolically active areas on a PET/CT scan in multiple areas including the right lower lobe of her lung and ovarian mass. Physical exam with mild rales and wheezing bilaterally and decreased breath sounds throughout. Hemoglobin was noted to be 6.9, potassium 5.3, BNP of 2298 troponin of 0.23 and trace of leukocytes in her urine. EKG showed sinus rhythm with multiple premature complexes both ventricular and supraventricular with probable left ventricular hypertrophy. Her rectal temp was noted to be 102.4. Chest x-ray showed stable cardiomegaly and vascular congestion with no superimposed process. CT scan showed no pulmonary embolism with known malignancy in the medial right lower lobe posterior to the right lower lobe bronchus. There is a new small nodule in the left apex with recommended attention on follow-up. She was diagnosed with symptomatic anemia likely iron deficiency improved after 1 unit of pack red blood cells and increasing  since transfusion. Stool was negative for blood. She was continued on ferrous gluconate increase to twice daily. Chest pain was noted to be with pericardial effusion likely related to demand ischemia in the setting of severe anemia and acute renal failure. Pulmonary hypertension with acute on chronic diastolic heart failure with BNP of 2298. She was diuresed with Lasix and started on hydrochlorothiazide. Acute renal failure improved with blood transfusion. Hyperkalemia resolved with Kayexalate. Fever is believed to be reactive process to the bronchoscopy and not warrant antibiotics. She was also noted to have 2 malignant processes in the lung and ovary that may be related or separate. She was unwilling to agree to several treatment options with recommended palliative/comfort care measures. Physical therapy recommended skilled nursing facility with patient refusing and preferred to go home with her son. Hospitalist recommended follow-up with primary care in 1-2 weeks with outpatient palliative care consult or from the hospice when patient is ready. Recommend BMP/CBC in 1-2 weeks to check for iron deficiency. Possible referral to oncology patient decides to follow further workup and treatment. All hospital records, labs, imaging reviewed in detail.  Since leaving the hospital she reports that she is feeling better and that her shortness of breath and wheezing has improved however continues to have episodes of shortness of breath on occasion. Reports taking her medications as prescribed and denies adverse side effects. She is currently working with home health physical therapy and reports that she is seeing some improvements in her mobility. Able to complete her activities of daily living with minimal assistance. Continues to re-iterate that she does not wish to pursue treatment for possible malignancy in her ovary and  lung. She has discussed her wishes with her children.   Allergies  Allergen Reactions  .  Penicillins Anaphylaxis and Swelling    Has patient had a PCN reaction causing immediate rash, facial/tongue/throat swelling, SOB or lightheadedness with hypotension: Yes Has patient had a PCN reaction causing severe rash involving mucus membranes or skin necrosis: Yes Has patient had a PCN reaction that required hospitalization Yes Has patient had a PCN reaction occurring within the last 10 years: No If all of the above answers are "NO", then may proceed with Cephalosporin use.       Outpatient Medications Prior to Visit  Medication Sig Dispense Refill  . aspirin EC 325 MG tablet Take 325 mg by mouth daily.    . ferrous gluconate (IRON 27) 240 (27 FE) MG tablet Take 1 tablet (240 mg total) by mouth 2 (two) times daily. 60 each 0  . hydrochlorothiazide (HYDRODIURIL) 25 MG tablet Take 1 tablet (25 mg total) by mouth daily. 30 tablet 0  . metoprolol tartrate (LOPRESSOR) 25 MG tablet Take 1 tablet (25 mg total) by mouth 2 (two) times daily. 180 tablet 3  . Multiple Vitamins-Minerals (CENTRUM SILVER 50+WOMEN) TABS Take 1 tablet by mouth daily.    Marland Kitchen nystatin (MYCOSTATIN) 100000 UNIT/ML suspension Take 5 mLs (500,000 Units total) by mouth 4 (four) times daily. 473 mL 0   No facility-administered medications prior to visit.       Past Surgical History:  Procedure Laterality Date  . COLONOSCOPY N/A 06/22/2014   Procedure: COLONOSCOPY;  Surgeon: Jerene Bears, MD;  Location: [facility] ENDOSCOPY;  Service: Endoscopy;  Laterality: N/A;  . ENDOBRONCHIAL ULTRASOUND N/A 12/31/2016   Procedure: ENDOBRONCHIAL ULTRASOUND;  Surgeon: Juanito Doom, MD;  Location: [facility] ENDOSCOPY;  Service: Cardiopulmonary;  Laterality: N/A;  . ESOPHAGOGASTRODUODENOSCOPY N/A 06/22/2014   Procedure: ESOPHAGOGASTRODUODENOSCOPY (EGD);  Surgeon: Jerene Bears, MD;  Location: [facility];  Service: Endoscopy;  Laterality: N/A;  . MASTECTOMY Left       Past Medical History:  Diagnosis Date  . Aortic insufficiency   . Blood  transfusion without reported diagnosis   . Breast cancer ([place])   . Cardiomyopathy ([place])   . CHF (congestive heart failure) ([place])   . Hypertension   . Mitral regurgitation       Review of Systems  Constitutional: Negative for chills and fever.  HENT: Negative for congestion.   Respiratory: Positive for shortness of breath. Negative for chest tightness and wheezing.   Cardiovascular: Negative for chest pain, palpitations and leg swelling.  Genitourinary: Negative for pelvic pain.      Objective:    BP 136/62 (BP Location: Left Arm, Patient Position: Sitting, Cuff Size: Normal)   Pulse 78   Temp 97.8 F (36.6 C) (Oral)   Resp 14   Ht 5\' 2"  (1.575 m)   Wt 123 lb (55.8 kg)   SpO2 96%   BMI 22.50 kg/m  Nursing note and vital signs reviewed.  Physical Exam  Constitutional: She is oriented to person, place, and time. She appears well-developed and well-nourished. No distress.  Cardiovascular: Normal rate, regular rhythm and intact distal pulses.   Murmur heard. Pulmonary/Chest: Effort normal and breath sounds normal. No respiratory distress. She has no wheezes. She has no rales. She exhibits no tenderness.  Neurological: She is alert and oriented to person, place, and time.  Skin: Skin is warm and dry.  Psychiatric: She has a normal mood and affect. Her behavior is normal. Judgment and thought content normal.  Assessment & Plan:   Problem List Items Addressed This Visit      Cardiovascular and Mediastinum   Acute on chronic diastolic heart failure ([place])    Appears stable and euvolemic currently. Occasional shortness of breath with no chest pain. Most recent 2-D echocardiogram the hospital shows low to normal left ventricular systolic function and severe left ventricular hypertrophy with biatrial enlargement. Continue current weight monitoring and current dosage of hydrochlorothiazide. Obtain BMET to check electrolytes.         Respiratory   Right lower lobe lung  mass    Lung mass with concern for malignancy and recommendation for follow up with oncology which patient continues to decline at this time denying any further interventions. Discussed possibility of this mass as well as ovarian mass being cancerous and she appears to be in denial about it and notes she feels fine now. Prognosis appears to be poor. Recommend follow up with pulmonology and referrals to palliative care/hospice discussed but declined at this time. Continue to monitor.         Other   Iron deficiency anemia - Primary    Iron deficiency anemia repleated with 1 unit of PRBC and has been stable since leaving the hospital and no evidence of blood loss. Obtain CBC. Continue current dosage of ferrous gluconate. Follow up pending blood work results.       Relevant Orders   Basic Metabolic Panel (BMET) (Completed)   CBC (Completed)   Ovarian mass, left    Ovarian mass with concern for malignancy. Patient declines surgery and chemotherapy at this time. Discussion regarding hospice/palliative care. Continue to monitor.           I am having Ms. Bines maintain her CENTRUM SILVER 50+WOMEN, metoprolol tartrate, aspirin EC, ferrous gluconate, hydrochlorothiazide, and nystatin.   Follow-up: Return in about 1 month (around 02/17/2017), or if symptoms worsen or fail to improve.  Mauricio Po, FNP

## 2017-01-17 ENCOUNTER — Ambulatory Visit (INDEPENDENT_AMBULATORY_CARE_PROVIDER_SITE_OTHER): Payer: Medicare Other | Admitting: Family

## 2017-01-17 ENCOUNTER — Other Ambulatory Visit (INDEPENDENT_AMBULATORY_CARE_PROVIDER_SITE_OTHER): Payer: Medicare Other

## 2017-01-17 VITALS — BP 136/62 | HR 78 | Temp 97.8°F | Resp 14 | Ht 62.0 in | Wt 123.0 lb

## 2017-01-17 DIAGNOSIS — N838 Other noninflammatory disorders of ovary, fallopian tube and broad ligament: Secondary | ICD-10-CM

## 2017-01-17 DIAGNOSIS — I5033 Acute on chronic diastolic (congestive) heart failure: Secondary | ICD-10-CM

## 2017-01-17 DIAGNOSIS — D508 Other iron deficiency anemias: Secondary | ICD-10-CM | POA: Diagnosis not present

## 2017-01-17 DIAGNOSIS — D509 Iron deficiency anemia, unspecified: Secondary | ICD-10-CM | POA: Diagnosis not present

## 2017-01-17 DIAGNOSIS — R918 Other nonspecific abnormal finding of lung field: Secondary | ICD-10-CM | POA: Diagnosis not present

## 2017-01-17 DIAGNOSIS — N839 Noninflammatory disorder of ovary, fallopian tube and broad ligament, unspecified: Secondary | ICD-10-CM

## 2017-01-17 LAB — BASIC METABOLIC PANEL
BUN: 32 mg/dL — ABNORMAL HIGH (ref 6–23)
CALCIUM: 9 mg/dL (ref 8.4–10.5)
CO2: 26 mEq/L (ref 19–32)
Chloride: 107 mEq/L (ref 96–112)
Creatinine, Ser: 1.4 mg/dL — ABNORMAL HIGH (ref 0.40–1.20)
GFR: 46.01 mL/min — AB (ref >60.00)
Glucose, Bld: 117 mg/dL — ABNORMAL HIGH (ref 70–99)
Potassium: 4.8 mEq/L (ref 3.5–5.1)
SODIUM: 136 meq/L (ref 135–145)

## 2017-01-17 LAB — CBC
HCT: 26 % — ABNORMAL LOW (ref 36.0–46.0)
Hemoglobin: 8.3 g/dL — ABNORMAL LOW (ref 12.0–15.0)
MCHC: 32 g/dL (ref 30.0–36.0)
MCV: 87.9 fl (ref 78.0–100.0)
PLATELETS: 215 10*3/uL (ref 150.0–400.0)
RBC: 2.95 Mil/uL — AB (ref 3.87–5.11)
RDW: 18.7 % — ABNORMAL HIGH (ref 11.5–15.5)
WBC: 7.2 10*3/uL (ref 4.0–10.5)

## 2017-01-17 NOTE — Assessment & Plan Note (Signed)
Appears stable and euvolemic currently. Occasional shortness of breath with no chest pain. Most recent 2-D echocardiogram the hospital shows low to normal left ventricular systolic function and severe left ventricular hypertrophy with biatrial enlargement. Continue current weight monitoring and current dosage of hydrochlorothiazide. Obtain BMET to check electrolytes.

## 2017-01-17 NOTE — Assessment & Plan Note (Signed)
Ovarian mass with concern for malignancy. Patient declines surgery and chemotherapy at this time. Discussion regarding hospice/palliative care. Continue to monitor.

## 2017-01-17 NOTE — Patient Instructions (Addendum)
Thank you for choosing [facility].  SUMMARY AND INSTRUCTIONS:  Follow up with Dr. Pennie Banter.   Consider Hospice or Palliative Care  Continue to work with physical therapy.  Medication:  Your prescription(s) have been submitted to your pharmacy or been printed and provided for you. Please take as directed and contact our office if you believe you are having problem(s) with the medication(s) or have any questions.  Labs:  Please stop by the lab on the lower level of the building for your blood work. Your results will be released to [place] (or called to you) after review, usually within 72 hours after test completion. If any changes need to be made, you will be notified at that same time.  1.) The lab is open from 7:30am to 5:30 pm Monday-Friday 2.) No appointment is necessary 3.) Fasting (if needed) is 6-8 hours after food and drink; black coffee and water are okay   Follow up:  If your symptoms worsen or fail to improve, please contact our office for further instruction, or in case of emergency go directly to the emergency room at the closest medical facility.    Palliative Care Palliative care is the care of your body, mind, and spirit. Palliative care services are offered to people dealing with serious and life-threatening illnesses, often in the hospital or a long-term care setting. Palliative care requires a team of people who will help to ensure:  Pain and symptoms are controlled.  Family support.  Spiritual support.  Emotional and social support.  Comfort. Palliative care is a way to bring comfort and peace of mind to a person and his or her family. It can also have a positive impact on the course of illness. Who can receive palliative care services? Palliative care is offered to children and adults when they are seriously ill and may not be responding well to treatment options. The person may be undergoing active treatment, such as chemotherapy, or may have  stopped active treatment. Some people may have just been diagnosed with advanced disease or life-limiting illness. A health care provider will usually recommend palliative care services when more support would be helpful. What types of services are included in palliative care? Palliative care includes a team of health care providers and supporters who come together to help a person facing serious and life-threatening illness. The person's existing doctors are included in the care team, and supporters are added. Family members and friends may also receive palliative care services to cope with stress and other concerns. Services are different for each person and are based on the person's needs and preferences. The following people make up a palliative care team:  The person receiving care and his or her family.  Physicians, including primary health care providers and specialists.  Nurses.  A psychosocial worker. Other people on the palliative care team may include:  A pain specialist and sometimes a hospice specialist.  A financial or Research officer, trade union.  [employer], such as school and spiritual organizations.  Religious leaders.  A care coordinator or case manager.  A bereavement coordinator. The team will talk with the person and his or her family about:  The role of the pain specialist and the hospice specialist if this applies.  The person's active physical symptoms, such as pain, nausea, vomiting, and shortness of breath.  Stress, depression, and anxiety symptoms.  Function and mobility issues and how to stay as active as possible.  Treatment options and how they may affect life.  Spiritual  wishes, such as rituals and prayer.  Legacy and memory making activities.  Life and death as a normal process.  Advance directives or living wills, health care proxies, and end-of-life care.  Any other concerns or issues. The palliative care team will make it okay to  talk about difficult issues and topics. Preferences and sensitive spiritual and emotional concerns are of high importance. Palliative care and hospice are somewhat different Palliative care and hospice care have similar goals of managing symptoms, promoting comfort, and maintaining dignity. However, hospice care is an option for people during the last 6 months of life expectancy. The palliative care team will coordinate the many support services provided during any phase of serious illness. This information is not intended to replace advice given to you by your health care provider. Make sure you discuss any questions you have with your health care provider. Document Released: 12/18/2013 Document Revised: 05/20/2016 Document Reviewed: 10/30/2013 Elsevier Interactive Patient Education  2017 [place] is a service that is designed to provide people who are terminally ill and their families with medical, spiritual, and psychological support. Its aim is to improve your quality of life by keeping you as alert and comfortable as possible. Hospice is performed by a team of health care professionals and volunteers who: Help keep you comfortable. Hospice can be provided in your home or in a homelike setting. The hospice staff works with your family and friends to help meet your needs. You will enjoy the support of loved ones by receiving much of your basic care from family and friends. Provide pain relief and manage your symptoms. The staff supply all necessary medicines and equipment. Provide companionship when you are alone. Allow you and your family to rest. They may do light housekeeping, prepare meals, and run errands. Provide counseling. They will make sure your emotional, spiritual, and social needs and those of your family are being met. Provide spiritual care. Spiritual care is individualized to meet your needs and your family's needs. It may involve helping you look at what death  means to you, say goodbye, or perform a specific religious ceremony or ritual. Hospice teams often include: A nurse. A doctor. Social workers. Religious leaders (such as a Clinical biochemist). Trained volunteers. WHEN SHOULD HOSPICE CARE BEGIN? Most people who use hospice are believed to have fewer than 6 months to live. Your family and health care providers can help you decide when hospice services should begin. If your condition improves, you may discontinue the program. WHAT [place]? Most hospice programs are run by nonprofit, independent organizations. Some are affiliated with hospitals, nursing homes, or home health care agencies. Hospice programs can take place in the home or at a hospice center, hospital, or skilled nursing facility. When choosing a hospice program, ask the following questions: What services are available to me? What services are offered to my loved ones? How involved are my loved ones? How involved is my health care provider? Who makes up the hospice care team? How are they trained or screened? How will my pain and symptoms be managed? If my circumstances change, can the services be provided in a different setting, such as my home or in the hospital? Is the program reviewed and licensed by the state or certified in some other way? WHERE CAN I LEARN MORE ABOUT HOSPICE? You can learn about existing hospice programs in your area from your health care providers. You can also read more about hospice online.  The websites of the following organizations contain helpful information: The [facility] and Palliative Care Organization [facility]). The Hospice Association of America ([place]). The [place]. The American Cancer Society (ACS). Hospice Net. This information is not intended to replace advice given to you by your health care provider. Make sure you discuss any questions you have with your health care provider. Document  Released: 03/31/2004 Document Revised: 12/18/2013 Document Reviewed: 10/23/2013 Elsevier Interactive Patient Education  2017 [employer].

## 2017-01-17 NOTE — Assessment & Plan Note (Signed)
Lung mass with concern for malignancy and recommendation for follow up with oncology which patient continues to decline at this time denying any further interventions. Discussed possibility of this mass as well as ovarian mass being cancerous and she appears to be in denial about it and notes she feels fine now. Prognosis appears to be poor. Recommend follow up with pulmonology and referrals to palliative care/hospice discussed but declined at this time. Continue to monitor.

## 2017-01-17 NOTE — Assessment & Plan Note (Signed)
Iron deficiency anemia repleated with 1 unit of PRBC and has been stable since leaving the hospital and no evidence of blood loss. Obtain CBC. Continue current dosage of ferrous gluconate. Follow up pending blood work results.

## 2017-01-18 ENCOUNTER — Telehealth: Payer: Self-pay | Admitting: Pulmonary Disease

## 2017-01-18 DIAGNOSIS — I351 Nonrheumatic aortic (valve) insufficiency: Secondary | ICD-10-CM | POA: Diagnosis not present

## 2017-01-18 DIAGNOSIS — Z7982 Long term (current) use of aspirin: Secondary | ICD-10-CM | POA: Diagnosis not present

## 2017-01-18 DIAGNOSIS — D649 Anemia, unspecified: Secondary | ICD-10-CM | POA: Diagnosis not present

## 2017-01-18 DIAGNOSIS — R918 Other nonspecific abnormal finding of lung field: Secondary | ICD-10-CM | POA: Diagnosis not present

## 2017-01-18 DIAGNOSIS — I429 Cardiomyopathy, unspecified: Secondary | ICD-10-CM | POA: Diagnosis not present

## 2017-01-18 DIAGNOSIS — I509 Heart failure, unspecified: Secondary | ICD-10-CM | POA: Diagnosis not present

## 2017-01-18 DIAGNOSIS — R1909 Other intra-abdominal and pelvic swelling, mass and lump: Secondary | ICD-10-CM | POA: Diagnosis not present

## 2017-01-18 DIAGNOSIS — N179 Acute kidney failure, unspecified: Secondary | ICD-10-CM | POA: Diagnosis not present

## 2017-01-18 DIAGNOSIS — I110 Hypertensive heart disease with heart failure: Secondary | ICD-10-CM | POA: Diagnosis not present

## 2017-01-18 DIAGNOSIS — I340 Nonrheumatic mitral (valve) insufficiency: Secondary | ICD-10-CM | POA: Diagnosis not present

## 2017-01-18 DIAGNOSIS — I313 Pericardial effusion (noninflammatory): Secondary | ICD-10-CM | POA: Diagnosis not present

## 2017-01-18 DIAGNOSIS — Z853 Personal history of malignant neoplasm of breast: Secondary | ICD-10-CM | POA: Diagnosis not present

## 2017-01-18 NOTE — Telephone Encounter (Signed)
Spoke with Donita, nurse at [facility]- states that pt is wanting to know how to proceed post biopsy.  Pt states she has not heard anything regarding biopsy, and does not have a follow up office visit scheduled. Unsure when she is to be scheduled.    BQ please advise.  Thanks!

## 2017-01-21 ENCOUNTER — Telehealth: Payer: Self-pay | Admitting: Family

## 2017-01-21 DIAGNOSIS — I351 Nonrheumatic aortic (valve) insufficiency: Secondary | ICD-10-CM | POA: Diagnosis not present

## 2017-01-21 DIAGNOSIS — I313 Pericardial effusion (noninflammatory): Secondary | ICD-10-CM | POA: Diagnosis not present

## 2017-01-21 DIAGNOSIS — I509 Heart failure, unspecified: Secondary | ICD-10-CM | POA: Diagnosis not present

## 2017-01-21 DIAGNOSIS — N179 Acute kidney failure, unspecified: Secondary | ICD-10-CM | POA: Diagnosis not present

## 2017-01-21 DIAGNOSIS — I340 Nonrheumatic mitral (valve) insufficiency: Secondary | ICD-10-CM | POA: Diagnosis not present

## 2017-01-21 DIAGNOSIS — D649 Anemia, unspecified: Secondary | ICD-10-CM | POA: Diagnosis not present

## 2017-01-21 DIAGNOSIS — I429 Cardiomyopathy, unspecified: Secondary | ICD-10-CM | POA: Diagnosis not present

## 2017-01-21 DIAGNOSIS — R918 Other nonspecific abnormal finding of lung field: Secondary | ICD-10-CM | POA: Diagnosis not present

## 2017-01-21 DIAGNOSIS — R1909 Other intra-abdominal and pelvic swelling, mass and lump: Secondary | ICD-10-CM | POA: Diagnosis not present

## 2017-01-21 DIAGNOSIS — Z7982 Long term (current) use of aspirin: Secondary | ICD-10-CM | POA: Diagnosis not present

## 2017-01-21 DIAGNOSIS — I110 Hypertensive heart disease with heart failure: Secondary | ICD-10-CM | POA: Diagnosis not present

## 2017-01-21 DIAGNOSIS — Z853 Personal history of malignant neoplasm of breast: Secondary | ICD-10-CM | POA: Diagnosis not present

## 2017-01-21 NOTE — Telephone Encounter (Signed)
Patient dropped by office and picked up stool card. Was advised to mail in when completed. Pt verbalized understanding.

## 2017-01-23 NOTE — Telephone Encounter (Signed)
Needs OV to discuss

## 2017-01-24 NOTE — Telephone Encounter (Signed)
Noted  

## 2017-01-24 NOTE — Telephone Encounter (Signed)
Spoke with pt. Made her aware of BQ message. Pt. Stated she will figure out the best time for her to come and then call us for a follow up appointment. Nothing further is needed at this time.

## 2017-01-25 DIAGNOSIS — R1909 Other intra-abdominal and pelvic swelling, mass and lump: Secondary | ICD-10-CM | POA: Diagnosis not present

## 2017-01-25 DIAGNOSIS — D649 Anemia, unspecified: Secondary | ICD-10-CM | POA: Diagnosis not present

## 2017-01-25 DIAGNOSIS — I313 Pericardial effusion (noninflammatory): Secondary | ICD-10-CM | POA: Diagnosis not present

## 2017-01-25 DIAGNOSIS — N179 Acute kidney failure, unspecified: Secondary | ICD-10-CM | POA: Diagnosis not present

## 2017-01-25 DIAGNOSIS — I340 Nonrheumatic mitral (valve) insufficiency: Secondary | ICD-10-CM | POA: Diagnosis not present

## 2017-01-25 DIAGNOSIS — I351 Nonrheumatic aortic (valve) insufficiency: Secondary | ICD-10-CM | POA: Diagnosis not present

## 2017-01-25 DIAGNOSIS — I429 Cardiomyopathy, unspecified: Secondary | ICD-10-CM | POA: Diagnosis not present

## 2017-01-25 DIAGNOSIS — Z853 Personal history of malignant neoplasm of breast: Secondary | ICD-10-CM | POA: Diagnosis not present

## 2017-01-25 DIAGNOSIS — Z7982 Long term (current) use of aspirin: Secondary | ICD-10-CM | POA: Diagnosis not present

## 2017-01-25 DIAGNOSIS — I509 Heart failure, unspecified: Secondary | ICD-10-CM | POA: Diagnosis not present

## 2017-01-25 DIAGNOSIS — R918 Other nonspecific abnormal finding of lung field: Secondary | ICD-10-CM | POA: Diagnosis not present

## 2017-01-25 DIAGNOSIS — I110 Hypertensive heart disease with heart failure: Secondary | ICD-10-CM | POA: Diagnosis not present

## 2017-01-26 ENCOUNTER — Telehealth: Payer: Self-pay | Admitting: Pulmonary Disease

## 2017-01-26 DIAGNOSIS — I351 Nonrheumatic aortic (valve) insufficiency: Secondary | ICD-10-CM | POA: Diagnosis not present

## 2017-01-26 DIAGNOSIS — R1909 Other intra-abdominal and pelvic swelling, mass and lump: Secondary | ICD-10-CM | POA: Diagnosis not present

## 2017-01-26 DIAGNOSIS — I429 Cardiomyopathy, unspecified: Secondary | ICD-10-CM | POA: Diagnosis not present

## 2017-01-26 DIAGNOSIS — Z853 Personal history of malignant neoplasm of breast: Secondary | ICD-10-CM | POA: Diagnosis not present

## 2017-01-26 DIAGNOSIS — I110 Hypertensive heart disease with heart failure: Secondary | ICD-10-CM | POA: Diagnosis not present

## 2017-01-26 DIAGNOSIS — I509 Heart failure, unspecified: Secondary | ICD-10-CM | POA: Diagnosis not present

## 2017-01-26 DIAGNOSIS — I340 Nonrheumatic mitral (valve) insufficiency: Secondary | ICD-10-CM | POA: Diagnosis not present

## 2017-01-26 DIAGNOSIS — N179 Acute kidney failure, unspecified: Secondary | ICD-10-CM | POA: Diagnosis not present

## 2017-01-26 DIAGNOSIS — R531 Weakness: Secondary | ICD-10-CM | POA: Diagnosis not present

## 2017-01-26 DIAGNOSIS — D649 Anemia, unspecified: Secondary | ICD-10-CM | POA: Diagnosis not present

## 2017-01-26 DIAGNOSIS — R918 Other nonspecific abnormal finding of lung field: Secondary | ICD-10-CM | POA: Diagnosis not present

## 2017-01-26 DIAGNOSIS — I313 Pericardial effusion (noninflammatory): Secondary | ICD-10-CM | POA: Diagnosis not present

## 2017-01-26 DIAGNOSIS — Z7982 Long term (current) use of aspirin: Secondary | ICD-10-CM | POA: Diagnosis not present

## 2017-01-26 NOTE — Telephone Encounter (Signed)
Spoke with pt's home health nurse, Donita. Donita states pt is requesting a f/u from her bx on 01-01-17. Per 01-18-17 phone note, BQ states pt will need OV to discuss.  I have scheduled pt for 01-28-17 @ 2:45. Donita states she will communicate this apt with pt, and will contact if this date or time does not work.  Nothing further needed at this time.

## 2017-01-27 DIAGNOSIS — I340 Nonrheumatic mitral (valve) insufficiency: Secondary | ICD-10-CM | POA: Diagnosis not present

## 2017-01-27 DIAGNOSIS — R1909 Other intra-abdominal and pelvic swelling, mass and lump: Secondary | ICD-10-CM | POA: Diagnosis not present

## 2017-01-27 DIAGNOSIS — R918 Other nonspecific abnormal finding of lung field: Secondary | ICD-10-CM | POA: Diagnosis not present

## 2017-01-27 DIAGNOSIS — N179 Acute kidney failure, unspecified: Secondary | ICD-10-CM | POA: Diagnosis not present

## 2017-01-27 DIAGNOSIS — I429 Cardiomyopathy, unspecified: Secondary | ICD-10-CM | POA: Diagnosis not present

## 2017-01-27 DIAGNOSIS — Z7982 Long term (current) use of aspirin: Secondary | ICD-10-CM | POA: Diagnosis not present

## 2017-01-27 DIAGNOSIS — I509 Heart failure, unspecified: Secondary | ICD-10-CM | POA: Diagnosis not present

## 2017-01-27 DIAGNOSIS — I351 Nonrheumatic aortic (valve) insufficiency: Secondary | ICD-10-CM | POA: Diagnosis not present

## 2017-01-27 DIAGNOSIS — I110 Hypertensive heart disease with heart failure: Secondary | ICD-10-CM | POA: Diagnosis not present

## 2017-01-27 DIAGNOSIS — Z853 Personal history of malignant neoplasm of breast: Secondary | ICD-10-CM | POA: Diagnosis not present

## 2017-01-27 DIAGNOSIS — D649 Anemia, unspecified: Secondary | ICD-10-CM | POA: Diagnosis not present

## 2017-01-27 DIAGNOSIS — I313 Pericardial effusion (noninflammatory): Secondary | ICD-10-CM | POA: Diagnosis not present

## 2017-01-28 ENCOUNTER — Ambulatory Visit (INDEPENDENT_AMBULATORY_CARE_PROVIDER_SITE_OTHER): Payer: Medicare Other | Admitting: Adult Health

## 2017-01-28 ENCOUNTER — Encounter: Payer: Self-pay | Admitting: Adult Health

## 2017-01-28 DIAGNOSIS — D649 Anemia, unspecified: Secondary | ICD-10-CM | POA: Diagnosis not present

## 2017-01-28 DIAGNOSIS — Z7982 Long term (current) use of aspirin: Secondary | ICD-10-CM | POA: Diagnosis not present

## 2017-01-28 DIAGNOSIS — Z853 Personal history of malignant neoplasm of breast: Secondary | ICD-10-CM | POA: Diagnosis not present

## 2017-01-28 DIAGNOSIS — I313 Pericardial effusion (noninflammatory): Secondary | ICD-10-CM | POA: Diagnosis not present

## 2017-01-28 DIAGNOSIS — R918 Other nonspecific abnormal finding of lung field: Secondary | ICD-10-CM | POA: Diagnosis not present

## 2017-01-28 DIAGNOSIS — I429 Cardiomyopathy, unspecified: Secondary | ICD-10-CM | POA: Diagnosis not present

## 2017-01-28 DIAGNOSIS — I340 Nonrheumatic mitral (valve) insufficiency: Secondary | ICD-10-CM | POA: Diagnosis not present

## 2017-01-28 DIAGNOSIS — I351 Nonrheumatic aortic (valve) insufficiency: Secondary | ICD-10-CM | POA: Diagnosis not present

## 2017-01-28 DIAGNOSIS — R1909 Other intra-abdominal and pelvic swelling, mass and lump: Secondary | ICD-10-CM | POA: Diagnosis not present

## 2017-01-28 DIAGNOSIS — N179 Acute kidney failure, unspecified: Secondary | ICD-10-CM | POA: Diagnosis not present

## 2017-01-28 DIAGNOSIS — I509 Heart failure, unspecified: Secondary | ICD-10-CM | POA: Diagnosis not present

## 2017-01-28 DIAGNOSIS — I110 Hypertensive heart disease with heart failure: Secondary | ICD-10-CM | POA: Diagnosis not present

## 2017-01-28 DIAGNOSIS — R911 Solitary pulmonary nodule: Secondary | ICD-10-CM

## 2017-01-28 NOTE — Assessment & Plan Note (Signed)
Enlarging lung nodule PET positive w/ nondiagnostic EBUS  Discussed results with patient and family member.  She declines further testing at this time . Advised of possible treatment options and referral for evaluation , she declines.  Offered Hospice/Palliative care she declines.   follow up in 3 months and As needed

## 2017-01-28 NOTE — Patient Instructions (Signed)
Follow up with Dr. Melvyn Novas  In 3 months and As needed   Call back if you change your mind on further testing /treatment or on hospice .

## 2017-01-28 NOTE — Progress Notes (Signed)
@Patient  ID: Valerie Downs, female    DOB: [birth year], 83 y.o.   MRN: [id number]  Chief Complaint  Patient presents with  . Follow-up    lung nodule     Referring provider: Golden Circle, FNP  HPI: 83 yo female smoker with hx of breast cancer (s/p mastectomy 1984) , ovarian mass and enlarging lung mass /LAN and   TEST  Bronchoscopy 12/31/16 >> atypical cells 4R node, benign tissue 7 node CT angio chest 01/01/17 >> pericardial effusion, 3.2 cm mass RLL, 5 mm nodule Lt apex Echo 01/02/17 >> severe LVH, EF 50 to 55%, mod AR, mild MR, severe LA dilation, mod RA dilation, PAS 83 mmHg, small pericardial effusion  01/28/2017 Post hospital follow up : Lung mass  Pt was recently seen for enlarging lung mass she underwent EBUS on 12/31/16 . She had chest pressure and increased sob on 1/6 and was admitted to hospital for  Sx anemia and pericardial effusion. ECHO done and showed EF 50-55%, Low normal LV systolic function; severe LVH; biatrial enlargement; moderate AI; mild MR and TR; severely elevatedpulmonary pressure; small pericardial effusion. EBUS results were nondiagnostic. .Was discussed that further testing may be indicated. She declined.Tami Ribas care consult . She declined hospice.  She declines further testing and hospice currently.  Finished up PT at home. Advised her if she changes her mind to call us back.      Allergies  Allergen Reactions  . Penicillins Anaphylaxis and Swelling    Has patient had a PCN reaction causing immediate rash, facial/tongue/throat swelling, SOB or lightheadedness with hypotension: Yes Has patient had a PCN reaction causing severe rash involving mucus membranes or skin necrosis: Yes Has patient had a PCN reaction that required hospitalization Yes Has patient had a PCN reaction occurring within the last 10 years: No If all of the above answers are "NO", then may proceed with Cephalosporin use.     Immunization History  Administered Date(s)  Administered  . Td 12/28/1995    Past Medical History:  Diagnosis Date  . Aortic insufficiency   . Blood transfusion without reported diagnosis   . Breast cancer ([place])   . Cardiomyopathy ([place])   . CHF (congestive heart failure) ([place])   . Hypertension   . Mitral regurgitation     Tobacco History: History  Smoking Status  . Current Some Day Smoker  . Packs/day: 0.25  . Years: 50.00  . Types: Cigarettes  Smokeless Tobacco  . Former Systems developer  . Types: Snuff    Comment: per patient has not used snuff in a while (10/18/16)   Ready to quit: No Counseling given: Yes   Outpatient Encounter Prescriptions as of 01/28/2017  Medication Sig  . aspirin EC 325 MG tablet Take 325 mg by mouth daily.  . ferrous gluconate (IRON 27) 240 (27 FE) MG tablet Take 1 tablet (240 mg total) by mouth 2 (two) times daily.  . hydrochlorothiazide (HYDRODIURIL) 25 MG tablet Take 1 tablet (25 mg total) by mouth daily.  . metoprolol tartrate (LOPRESSOR) 25 MG tablet Take 1 tablet (25 mg total) by mouth 2 (two) times daily.  . Multiple Vitamins-Minerals (CENTRUM SILVER 50+WOMEN) TABS Take 1 tablet by mouth daily.  Marland Kitchen nystatin (MYCOSTATIN) 100000 UNIT/ML suspension Take 5 mLs (500,000 Units total) by mouth 4 (four) times daily.   No facility-administered encounter medications on file as of 01/28/2017.      Review of Systems  Constitutional:   No  weight loss, night sweats,  Fevers, chills, + fatigue, or  lassitude.  HEENT:   No headaches,  Difficulty swallowing,  Tooth/dental problems, or  Sore throat,                No sneezing, itching, ear ache, nasal congestion, post nasal drip,   CV:  No chest pain,  Orthopnea, PND, swelling in lower extremities, anasarca, dizziness, palpitations, syncope.   GI  No heartburn, indigestion, abdominal pain, nausea, vomiting, diarrhea, change in bowel habits, loss of appetite, bloody stools.   Resp:  No excess mucus, no productive cough,  No non-productive cough,  No  coughing up of blood.  No change in color of mucus.  No wheezing.  No chest wall deformity  Skin: no rash or lesions.  GU: no dysuria, change in color of urine, no urgency or frequency.  No flank pain, no hematuria   MS:  No joint pain or swelling.  No decreased range of motion.  No back pain.    Physical Exam  BP (!) 122/58 (BP Location: Left Arm, Cuff Size: Normal)   Pulse 89   Ht 5\' 2"  (1.575 m)   Wt 122 lb 3.2 oz (55.4 kg)   SpO2 98%   BMI 22.35 kg/m   GEN: A/Ox3; pleasant , NAD, thin frail stoic female    HEENT:  [place]/AT,  EACs-clear, TMs-wnl, NOSE-clear, THROAT-clear, no lesions, no postnasal drip or exudate noted.   NECK:  Supple w/ fair ROM; no JVD; normal carotid impulses w/o bruits; no thyromegaly or nodules palpated; no lymphadenopathy.    RESP  Clear  P & A; w/o, wheezes/ rales/ or rhonchi. no accessory muscle use, no dullness to percussion  CARD:  RRR, no m/r/g, no peripheral edema, pulses intact, no cyanosis or clubbing.  GI:   Soft & nt; nml bowel sounds; no organomegaly or masses detected.   Musco: Warm bil, no deformities or joint swelling noted.   Neuro: alert, no focal deficits noted.    Skin: Warm, no lesions or rashes     Lab ResulCt Angio Chest Pe W And/or Wo Contrast  Result Date: 01/01/2017 CLINICAL DATA:  Right lung biopsy yesterday.  Respiratory distress. EXAM: CT ANGIOGRAPHY CHEST WITH CONTRAST TECHNIQUE: Multidetector CT imaging of the chest was performed using the standard protocol during bolus administration of intravenous contrast. Multiplanar CT image reconstructions and MIPs were obtained to evaluate the vascular anatomy. CONTRAST:  80 cc of Isovue 370 COMPARISON:  None. FINDINGS: Cardiovascular: Cardiomegaly is identified. Coronary artery calcifications are noted. The ascending thoracic aorta measures 4.7 cm in AP dimension on series 4, image 46 unchanged in the interval. No dissection. No pulmonary emboli. Mediastinum/Nodes: Small bilateral  pleural effusions. There is a pericardial effusion posteriorly which was not present on the previous PET-CT measuring 2 cm in thickness on series 4, image 63. A mildly prominent node to the right of the aorta on series 4, image 76 is stable. The node anterior to the trachea on series 4, image 33 is more prominent in the interval. The pretracheal node on image 30 is unchanged. No other change in mediastinal nodes. No other adenopathy elsewhere in the chest. The esophagus is normal. Lungs/Pleura: The central airways are unchanged and unremarkable. No pneumothorax. The known nodule just posterior to the right lower lobe bronchus measures 3.2 x 2.9 cm today versus 2.5 by 2.3 cm on the September 08, 2016 PET-CT. There is a small nodule in the left apex on series 11, image 16 not seen on the comparison  PET-CT measuring 5 mm today. No other new pulmonary nodules. No overt edema. Small bilateral pleural effusions are identified with associated atelectasis. Upper Abdomen: No acute abnormality. Musculoskeletal: No chest wall abnormality. No acute or significant osseous findings. Review of the MIP images confirms the above findings. IMPRESSION: 1. No pulmonary emboli. 2. The patient's known malignancy in the medial right lower lobe posterior to the right lower lobe bronchus is larger in the interval. A pre tracheal node is more prominent in the interval. No other change within visualized lymph nodes. 3. New pericardial effusion posteriorly only measuring 2 cm. New small pleural effusions. 4. New small nodule in the left apex. Recommend attention on follow-up. 5. **An incidental finding of potential clinical significance has been found. The ascending thoracic aorta measures 4.7 cm which is stable. Ascending thoracic aortic aneurysm. Recommend semi-annual imaging followup by CTA or MRA and referral to cardiothoracic surgery if not already obtained. This recommendation follows 2010 ACCF/AHA/AATS/ACR/ASA/SCA/SCAI/SIR/STS/SVM  Guidelines for the Diagnosis and Management of Patients With Thoracic Aortic Disease. Circulation. 2010; 121: [id number]** Electronically Signed   By: Dorise Bullion III M.D   On: 01/01/2017 19:30   Dg Chest Port 1 View  Result Date: 01/04/2017 CLINICAL DATA:  Shortness of breath EXAM: PORTABLE CHEST 1 VIEW COMPARISON:  Three days ago FINDINGS: Cardiopericardial enlargement and aortic tortuosity, stable. Mild basilar atelectasis. No air bronchogram or edema. No effusion or pneumothorax. Nodule behind the right hilum not seen radiographically. Postoperative left axilla. IMPRESSION: 1. Stable from 3 days prior. 2. Cardiomegaly without failure. Electronically Signed   By: Monte Fantasia M.D.   On: 01/04/2017 07:02   Dg Chest Port 1 View  Result Date: 01/01/2017 CLINICAL DATA:  Cough, shortness of breath recent lung biopsy EXAM: PORTABLE CHEST 1 VIEW COMPARISON:  09/16/2016, 09/08/2016 FINDINGS: Stable cardiomegaly and vascular congestion. Postop changes in the left axilla. No superimposed CHF, pneumonia, collapse or consolidation. No large effusion or pneumothorax. Degenerative changes of the spine. Aorta is atherosclerotic and tortuous. Trachea is midline. Bones are osteopenic. Known posterior right hilar nodule is not visualized by plain radiography. IMPRESSION: Stable cardiomegaly and vascular congestion. No superimposed acute process. Right hilar nodule posteriorly is better demonstrated by recent PET-CT and not visualized by plain radiography. Electronically Signed   By: Jerilynn Mages.  Shick M.D.   On: 01/01/2017 15:18     Assessment & Plan:   Solitary pulmonary nodule Enlarging lung nodule PET positive w/ nondiagnostic EBUS  Discussed results with patient and family member.  She declines further testing at this time . Advised of possible treatment options and referral for evaluation , she declines.  Offered Hospice/Palliative care she declines.   follow up in 3 months and As needed        Rexene Edison, NP 01/28/2017

## 2017-01-31 NOTE — Progress Notes (Signed)
Reviewed, agree 

## 2017-02-02 DIAGNOSIS — D649 Anemia, unspecified: Secondary | ICD-10-CM | POA: Diagnosis not present

## 2017-02-02 DIAGNOSIS — I313 Pericardial effusion (noninflammatory): Secondary | ICD-10-CM | POA: Diagnosis not present

## 2017-02-02 DIAGNOSIS — I429 Cardiomyopathy, unspecified: Secondary | ICD-10-CM | POA: Diagnosis not present

## 2017-02-02 DIAGNOSIS — R918 Other nonspecific abnormal finding of lung field: Secondary | ICD-10-CM | POA: Diagnosis not present

## 2017-02-02 DIAGNOSIS — I340 Nonrheumatic mitral (valve) insufficiency: Secondary | ICD-10-CM | POA: Diagnosis not present

## 2017-02-02 DIAGNOSIS — Z853 Personal history of malignant neoplasm of breast: Secondary | ICD-10-CM | POA: Diagnosis not present

## 2017-02-02 DIAGNOSIS — I351 Nonrheumatic aortic (valve) insufficiency: Secondary | ICD-10-CM | POA: Diagnosis not present

## 2017-02-02 DIAGNOSIS — Z7982 Long term (current) use of aspirin: Secondary | ICD-10-CM | POA: Diagnosis not present

## 2017-02-02 DIAGNOSIS — I509 Heart failure, unspecified: Secondary | ICD-10-CM | POA: Diagnosis not present

## 2017-02-02 DIAGNOSIS — I110 Hypertensive heart disease with heart failure: Secondary | ICD-10-CM | POA: Diagnosis not present

## 2017-02-02 DIAGNOSIS — N179 Acute kidney failure, unspecified: Secondary | ICD-10-CM | POA: Diagnosis not present

## 2017-02-02 DIAGNOSIS — R1909 Other intra-abdominal and pelvic swelling, mass and lump: Secondary | ICD-10-CM | POA: Diagnosis not present

## 2017-02-09 ENCOUNTER — Other Ambulatory Visit: Payer: Self-pay

## 2017-02-09 ENCOUNTER — Other Ambulatory Visit (INDEPENDENT_AMBULATORY_CARE_PROVIDER_SITE_OTHER): Payer: Medicare Other

## 2017-02-09 DIAGNOSIS — D649 Anemia, unspecified: Secondary | ICD-10-CM | POA: Diagnosis not present

## 2017-02-09 LAB — FECAL OCCULT BLOOD, IMMUNOCHEMICAL: Fecal Occult Bld: NEGATIVE

## 2017-03-29 ENCOUNTER — Telehealth: Payer: Self-pay | Admitting: *Deleted

## 2017-03-29 NOTE — Telephone Encounter (Signed)
Pt walked into office accompanied by her son and told greeter she has been having shortness of breath. I spoke with pt. She reports shortness of breath off and on for last 2 weeks.  Had a "bad episode" last night.  Feels better today.  No chest pain. Oxygen sat 98%.  I offered to call EMS to transport pt to the hospital but she does not feel she needs to go to hospital. I scheduled her to see Richardson Dopp, PA on 04/01/17 at 10:15.  I instructed her to go to ED if symptoms worsen prior to this appt.  I also told her to contact pulmonary care and primary care to schedule appointments.  Pt left office with her son.

## 2017-04-01 ENCOUNTER — Ambulatory Visit (INDEPENDENT_AMBULATORY_CARE_PROVIDER_SITE_OTHER): Payer: Medicare Other | Admitting: Physician Assistant

## 2017-04-01 ENCOUNTER — Encounter (INDEPENDENT_AMBULATORY_CARE_PROVIDER_SITE_OTHER): Payer: Self-pay

## 2017-04-01 ENCOUNTER — Encounter: Payer: Self-pay | Admitting: Physician Assistant

## 2017-04-01 VITALS — BP 150/70 | HR 75 | Ht 62.0 in | Wt 118.0 lb

## 2017-04-01 DIAGNOSIS — I38 Endocarditis, valve unspecified: Secondary | ICD-10-CM | POA: Diagnosis not present

## 2017-04-01 DIAGNOSIS — I712 Thoracic aortic aneurysm, without rupture: Secondary | ICD-10-CM | POA: Diagnosis not present

## 2017-04-01 DIAGNOSIS — R0602 Shortness of breath: Secondary | ICD-10-CM

## 2017-04-01 DIAGNOSIS — I272 Pulmonary hypertension, unspecified: Secondary | ICD-10-CM

## 2017-04-01 DIAGNOSIS — I3139 Other pericardial effusion (noninflammatory): Secondary | ICD-10-CM

## 2017-04-01 DIAGNOSIS — I313 Pericardial effusion (noninflammatory): Secondary | ICD-10-CM | POA: Diagnosis not present

## 2017-04-01 DIAGNOSIS — I7121 Aneurysm of the ascending aorta, without rupture: Secondary | ICD-10-CM

## 2017-04-01 DIAGNOSIS — I422 Other hypertrophic cardiomyopathy: Secondary | ICD-10-CM | POA: Diagnosis not present

## 2017-04-01 DIAGNOSIS — I5032 Chronic diastolic (congestive) heart failure: Secondary | ICD-10-CM

## 2017-04-01 MED ORDER — AMLODIPINE BESYLATE 2.5 MG PO TABS: 2.5000 mg | ORAL_TABLET | Freq: Every day | ORAL | 3 refills | Status: DC

## 2017-04-01 NOTE — Patient Instructions (Addendum)
Medication Instructions:  1. START AMLODIPINE 2.5 MG DAILY; RX HAS BEEN SENT IN  Labwork: 1. TODAY BMET, PRO BNP, CBC  Testing/Procedures: 1. Your physician has requested that you have an LIMITED Echocardiogram. Echocardiography is a painless test that uses sound waves to create images of your heart. It provides your doctor with information about the size and shape of your heart and how well your heart's chambers and valves are working. This procedure takes approximately one hour. There are no restrictions for this procedure.  Follow-Up: KEEP YOUR UPCOMING APPT WITH DR. Angelena Form 04/20/17  Any Other Special Instructions Will Be Listed Below (If Applicable).  If you need a refill on your cardiac medications before your next appointment, please call your pharmacy.

## 2017-04-01 NOTE — Progress Notes (Signed)
Cardiology Office Note:    Date:  04/01/2017   ID:  Valerie Downs, DOB 1930/07/10, MRN [id number]  PCP:  Mauricio Po, FNP  Cardiologist:  Dr. Lauree Chandler   Electrophysiologist:  n/a  Referring MD: Golden Circle, FNP   Chief Complaint  Patient presents with  . Shortness of Breath    History of Present Illness:    Valerie Downs is a 83 y.o. female with a hx of Breast cancer, hypertrophic cardiomyopathy without obstruction with prior reduced LV function (EF 40-45 in 8/14) valvular heart disease with moderate aortic insufficiency and moderate mitral regurgitation. She initially was admitted in 7/14 with dyspnea in the setting of systolic heart failure. EF was 40-45 at that time with moderate AI and moderate MR as well as moderate TR. Follow-up echo in 8/15 demonstrated improved LV function with an EF of 55-60. She underwent evaluation for chest pain in 7/17. EF was normal by echo. Nuclear stress test demonstrated a possible scar but no ischemia with EF 40%. She was seen by oncology for an ovarian mass noted on CT but the patient was not interested in surgery or chemotherapy. She was noted to have a lung mass with plans for EBUS biopsy when last seen by Dr. Angelena Form in 10/17. Cardiac catheterization was not recommended at that time and she was cleared to proceed with her biopsy.   She was admitted in 12/2016 24 hours after her lung bx.  Of note, cytology demonstrated atypical but benign cells. She was admitted with dyspnea, chest pain and cough. Hemoglobin was 6.9 and she had transfusion with PRBCs x 1.  Her BNP was significantly elevated at 2298.  There was a minimal, flat Troponin elevation.  There was what appeared to be a large pericardial effusion on CT scan. Echocardiogram demonstrated a small pericardial effusion. She was seen by cardiology (Dr. Ellyn Hack). Effusion was not felt to be significant. The troponin elevation was felt to be related to demand ischemia and no further workup  was recommended. She was diuresed. Of note, the patient declined further treatment and blood draws in the hospital as well as recommendations for DC to SNF. She was given kayexalate for high potassium and her ARB was held at DC.    Last seen by pulmonology in 2/18. She refused further workup or care for her malignancy. She walked into our office 03/29/17 with complaints of shortness of breath. She is therefore added on to my schedule for further evaluation.   She is here alone today. She notes symptoms of orthopnea as well as PND and dyspnea on exertion. Duration is not entirely clear. She has some ankle edema on the left after a prior injury without significant change. She notes chronic dyspnea with activities that is largely unchanged. She does note some tightness in her chest with activity and shortness of breath. She denies associated nausea, diaphoresis or radiating symptoms. She denies any syncope. She does have a chronic cough and denies hemoptysis. Denies fever. She does admit to continued tobacco abuse.  Prior CV studies:   The following studies were reviewed today:  Echo 01/02/17 Severe LVH, EF 50-55, normal wall motion, moderate AI, MAC, mild MR, severe LAE, moderate RAE, PASP 83 (severely increased), small pericardial effusion  Chest CTA 01/01/17 IMPRESSION: 1. No pulmonary emboli. 2. The patient's known malignancy in the medial right lower lobe posterior to the right lower lobe bronchus is larger in the interval. A pre tracheal node is more prominent in the interval. No  other change within visualized lymph nodes. 3. New pericardial effusion posteriorly only measuring 2 cm. New small pleural effusions. 4. New small nodule in the left apex. Recommend attention on follow-up. 5. **An incidental finding of potential clinical significance has been found. The ascending thoracic aorta measures 4.7 cm which is stable. Ascending thoracic aortic aneurysm. Recommend semi-annual imaging followup  by CTA or MRA and referral to cardiothoracic surgery if not already obtained. This recommendation follows 2010 ACCF/AHA/AATS/ACR/ASA/SCA/SCAI/SIR/STS/SVM Guidelines for the Diagnosis and Management of Patients With Thoracic Aortic Disease. Circulation. 2010; 121: [id number]**  Echo 07/07/16 Severe focal basal and moderate concentric LVH, EF 60-65, normal wall motion, grade 1 diastolic dysfunction, moderate AI, mildly dilated aortic root (40 mm), MAC, moderate MR, severe LAE, mild TR, PI, PASP within normal range  Myoview 07/07/16 IMPRESSION: 1. No reversible ischemia.  Inferior wall attenuation or scar. 2. Global hypokinesis most marked in the septum. 3. Left ventricular ejection fraction 44% 4. Intermediate-risk stress test findings*. (medical Rx planned)  Echo 08/07/14 Severe basal septal hypertrophy, no SAM LVOT gradient or subvalvular web, EF 55-60, mild to moderate AI, aortic root appears dilated, MAC, mild MR, mild LAE, PASP 34   Past Medical History:  Diagnosis Date  . Aortic insufficiency   . Blood transfusion without reported diagnosis   . Breast cancer ([place])   . Cardiomyopathy ([place])   . CHF (congestive heart failure) ([place])   . Hypertension   . Mitral regurgitation     Past Surgical History:  Procedure Laterality Date  . COLONOSCOPY N/A 06/22/2014   Procedure: COLONOSCOPY;  Surgeon: Jerene Bears, MD;  Location: [facility] ENDOSCOPY;  Service: Endoscopy;  Laterality: N/A;  . ENDOBRONCHIAL ULTRASOUND N/A 12/31/2016   Procedure: ENDOBRONCHIAL ULTRASOUND;  Surgeon: Juanito Doom, MD;  Location: [facility] ENDOSCOPY;  Service: Cardiopulmonary;  Laterality: N/A;  . ESOPHAGOGASTRODUODENOSCOPY N/A 06/22/2014   Procedure: ESOPHAGOGASTRODUODENOSCOPY (EGD);  Surgeon: Jerene Bears, MD;  Location: [facility];  Service: Endoscopy;  Laterality: N/A;  . MASTECTOMY Left     Current Medications: Current Meds  Medication Sig  . aspirin EC 325 MG tablet Take 325 mg by mouth daily.  . ferrous  gluconate (IRON 27) 240 (27 FE) MG tablet Take 1 tablet (240 mg total) by mouth 2 (two) times daily.  . hydrochlorothiazide (HYDRODIURIL) 25 MG tablet Take 1 tablet (25 mg total) by mouth daily.  . metoprolol tartrate (LOPRESSOR) 25 MG tablet Take 1 tablet (25 mg total) by mouth 2 (two) times daily.  . Multiple Vitamins-Minerals (CENTRUM SILVER 50+WOMEN) TABS Take 1 tablet by mouth daily.     Allergies:   Penicillins   Social History   Social History  . Marital status: Widowed    Spouse name: N/A  . Number of children: 10  . Years of education: 5   Occupational History  . Retired    Social History Main Topics  . Smoking status: Current Some Day Smoker    Packs/day: 0.25    Years: 50.00    Types: Cigarettes  . Smokeless tobacco: Former Systems developer    Types: Snuff     Comment: per patient has not used snuff in a while (10/18/16)  . Alcohol use No  . Drug use: No  . Sexual activity: No   Other Topics Concern  . None   Social History Narrative   Denies abuse and feels safe at home.      Family History  Problem Relation Age of Onset  . Hypertension Mother   . Cancer  Father      ROS:   Please see the history of present illness.    Review of Systems  Constitution: Positive for decreased appetite and night sweats.  Cardiovascular: Positive for leg swelling.  Respiratory: Positive for shortness of breath.   Skin: Positive for rash.   All other systems reviewed and are negative.   EKGs/Labs/Other Test Reviewed:    EKG:  EKG is  ordered today.  The ekg ordered today demonstrates NSR, HR 75, LAD, nonspecific ST-T wave changes, septal Q waves, QTc 480 ms, similar to prior tracings  Recent Labs: 07/06/2016: TSH 0.038 01/01/2017: B Natriuretic Peptide 2,298.0 01/05/2017: ALT 15; Magnesium 2.1 01/17/2017: BUN 32; Creatinine, Ser 1.40; Hemoglobin 8.3 Repeated and verified X2.; Platelets 215.0; Potassium 4.8; Sodium 136   Recent Lipid Panel    Component Value Date/Time   CHOL 101  07/06/2016 1954   TRIG 66 07/06/2016 1954   HDL 39 (L) 07/06/2016 1954   CHOLHDL 2.6 07/06/2016 1954   VLDL 13 07/06/2016 1954   LDLCALC 49 07/06/2016 1954     Physical Exam:    VS:  BP (!) 150/70   Pulse 75   Ht _0  (1.575 m)   Wt 118 lb (53.5 kg)   SpO2 99%   BMI 21.58 kg/m     Wt Readings from Last 3 Encounters:  04/01/17 118 lb (53.5 kg)  01/28/17 122 lb 3.2 oz (55.4 kg)  01/17/17 123 lb (55.8 kg)     Physical Exam  Constitutional: She is oriented to person, place, and time. She appears well-developed. She appears cachectic. No distress.  HENT:  Head: Normocephalic and atraumatic.  Eyes: No scleral icterus.  Neck: Normal range of motion. No JVD present.  Cardiovascular: Normal rate, regular rhythm, S1 normal, S2 normal and normal heart sounds.   No murmur (no obvious murmur) heard. [place] S2  Pulmonary/Chest: Breath sounds normal. She has no wheezes. She has no rhonchi. She has no rales.  Abdominal: Soft. There is no tenderness.  Musculoskeletal: She exhibits no edema.  Neurological: She is alert and oriented to person, place, and time.  Skin: Skin is warm and dry.  Psychiatric: She has a normal mood and affect.    ASSESSMENT:    1. Shortness of breath   2. Chronic diastolic CHF (congestive heart failure) ([place])   3. Hypertrophic cardiomyopathy ([place])   4. Valvular heart disease   5. Thoracic ascending aortic aneurysm ([place])   6. Moderate to severe pulmonary hypertension   7. Pericardial effusion    PLAN:    In order of problems listed above:  1. Shortness of breath - She complains of shortness of breath as well as orthopnea and PND which raises the concern for acute on chronic diastolic heart failure. However, her exam is not consistent with volume overload. She has several other reasons to be short of breath (COPD, possible lung malignancy). She had significant pulmonary hypertension on her last echo in 1/18 as well as a small pericardial effusion. Her  oxygen today is 99% on room air.  She has regular follow-up next month with pulmonology as well as follow-up with Dr. Angelena Form later this month. She does describe some chest discomfort at times as well. She does not have any significant ST changes on her ECG.  -  BMET, CBC, BNP  -  Obtain repeat limited echocardiogram (recheck EF, recheck effusion, recheck PASP]  -  If BNP is significantly elevated, change HCTZ to Lasix  -  Add  amlodipine 2.5 mg daily (for treatment of pulmonary hypertension, essential hypertension,?angina)  -  Keep follow-up with Dr. Angelena Form later this month, sooner if symptoms worsen  2. Chronic diastolic CHF (congestive heart failure) ([facility]) - She had evidence of acute CHF in 1/18 in the setting of significant anemia. She does not appear volume overloaded today. Obtain BNP as noted. Arrange repeat limited echo.  3. Hypertrophic cardiomyopathy ([place]) -  She has had a history of severe LVH without clear obstruction. Recent echo with similar findings.  4. Valvular heart disease -  Mitral regurgitation and aortic insufficiency fairly stable on last echocardiogram.  5. Thoracic ascending aortic aneurysm ([place]) -  Noted on recent CT scan. Consider repeat CT in one year.  6. Moderate to severe pulmonary hypertension -  I suspect that this is likely related to her pulmonary diagnoses. Repeat echocardiogram will be obtained to reassess PASP.  7. Pericardial effusion -  Arrange repeat limited echo to recheck effusion.  Dispo:  Return in about 3 weeks (around 04/20/2017) for Scheduled Follow Up with Dr. Angelena Form.   Medication Adjustments/Labs and Tests Ordered: Current medicines are reviewed at length with the patient today.  Concerns regarding medicines are outlined above.  Medication changes, Labs and Tests ordered today are outlined in the Patient Instructions noted below. Patient Instructions  Medication Instructions:  1. START AMLODIPINE 2.5 MG DAILY; RX HAS BEEN SENT  IN  Labwork: 1. TODAY BMET, PRO BNP, CBC  Testing/Procedures: 1. Your physician has requested that you have an LIMITED Echocardiogram. Echocardiography is a painless test that uses sound waves to create images of your heart. It provides your doctor with information about the size and shape of your heart and how well your heart's chambers and valves are working. This procedure takes approximately one hour. There are no restrictions for this procedure.  Follow-Up: KEEP YOUR UPCOMING APPT WITH DR. Angelena Form 04/20/17  Any Other Special Instructions Will Be Listed Below (If Applicable).  If you need a refill on your cardiac medications before your next appointment, please call your pharmacy.  Signed, Richardson Dopp, PA-C  04/01/2017 1:35 PM    [facility] [address], [place], [place]  [address] Phone: [phone]; Fax: [phone]

## 2017-04-02 LAB — BASIC METABOLIC PANEL
BUN / CREAT RATIO: 26 (ref 12–28)
BUN: 32 mg/dL — AB (ref 8–27)
CALCIUM: 9.5 mg/dL (ref 8.7–10.3)
CO2: 22 mmol/L (ref 18–29)
Chloride: 102 mmol/L (ref 96–106)
Creatinine, Ser: 1.25 mg/dL — ABNORMAL HIGH (ref 0.57–1.00)
GFR calc Af Amer: 46 mL/min/{1.73_m2} — ABNORMAL LOW (ref >59)
GFR calc non Af Amer: 40 mL/min/{1.73_m2} — ABNORMAL LOW (ref >59)
GLUCOSE: 100 mg/dL — AB (ref 65–99)
Potassium: 6.1 mmol/L — ABNORMAL HIGH (ref 3.5–5.2)
Sodium: 136 mmol/L (ref 134–144)

## 2017-04-02 LAB — CBC
HEMATOCRIT: 28.3 % — AB (ref 34.0–46.6)
HEMOGLOBIN: 8.8 g/dL — AB (ref 11.1–15.9)
MCH: 28.5 pg (ref 26.6–33.0)
MCHC: 31.1 g/dL — ABNORMAL LOW (ref 31.5–35.7)
MCV: 92 fL (ref 79–97)
Platelets: 270 10*3/uL (ref 150–379)
RBC: 3.09 x10E6/uL — AB (ref 3.77–5.28)
RDW: 14.5 % (ref 12.3–15.4)
WBC: 6.4 10*3/uL (ref 3.4–10.8)

## 2017-04-02 LAB — PRO B NATRIURETIC PEPTIDE: NT-Pro BNP: 31525 pg/mL — ABNORMAL HIGH (ref 0–738)

## 2017-04-03 ENCOUNTER — Other Ambulatory Visit: Payer: Self-pay | Admitting: Physician Assistant

## 2017-04-03 ENCOUNTER — Telehealth: Payer: Self-pay | Admitting: Physician Assistant

## 2017-04-03 DIAGNOSIS — I5032 Chronic diastolic (congestive) heart failure: Secondary | ICD-10-CM

## 2017-04-03 DIAGNOSIS — E875 Hyperkalemia: Secondary | ICD-10-CM

## 2017-04-03 MED ORDER — FUROSEMIDE 40 MG PO TABS: 40.0000 mg | ORAL_TABLET | Freq: Every day | ORAL | 11 refills | Status: DC

## 2017-04-04 ENCOUNTER — Telehealth: Payer: Self-pay | Admitting: *Deleted

## 2017-04-04 ENCOUNTER — Encounter: Payer: Self-pay | Admitting: *Deleted

## 2017-04-04 ENCOUNTER — Other Ambulatory Visit: Payer: Self-pay | Admitting: *Deleted

## 2017-04-04 DIAGNOSIS — I5032 Chronic diastolic (congestive) heart failure: Secondary | ICD-10-CM

## 2017-04-04 NOTE — Telephone Encounter (Signed)
STAT BMET ORDERED FOR 04/05/17 PER SCOTT WEAVER, PAC

## 2017-04-04 NOTE — Telephone Encounter (Signed)
-----   Message from Liliane Shi, [place] sent at 04/02/2017  5:02 PM EDT ----- Bonne Dolores to call patient on her cell phone and tried to call her daughter Lovenia Debruler [facility] on file) - no answer. Kidney function is stable. The potassium is high. The hemoglobin is low but stable. The BNP is high. Need to: DC HCTZ Start Lasix 40 mg QD DC potassium supplement (if taking - not on med list). Avoid dietary potassium. Repeat BMET Monday 04/04/17. Richardson Dopp, PA-C    04/02/2017 5:02 PM

## 2017-04-04 NOTE — Telephone Encounter (Signed)
I s/w pt this morning about needing repeat bmet either today or tomorrow per Brynda Rim. PA. Pt opts to do lab work tomorrow. I asked pt to try to come in sometime tomorrow before 12 noon. Pt is agreeable to plan of care and instructions discussed with her by Richardson Dopp, PA on 04/03/17.

## 2017-04-04 NOTE — Addendum Note (Signed)
Addended by: Michae Kava on: 04/04/2017 09:00 AM   Modules accepted: Orders

## 2017-04-04 NOTE — Telephone Encounter (Signed)
-----   Message from Liliane Shi, [place] sent at 04/02/2017  5:02 PM EDT ----- Bonne Dolores to call patient on her cell phone and tried to call her daughter Swetha Rayle [facility] on file) - no answer. Kidney function is stable. The potassium is high. The hemoglobin is low but stable. The BNP is high. Need to: DC HCTZ Start Lasix 40 mg QD DC potassium supplement (if taking - not on med list). Avoid dietary potassium. Repeat BMET Monday 04/04/17. Richardson Dopp, PA-C    04/02/2017 5:02 PM

## 2017-04-05 ENCOUNTER — Other Ambulatory Visit: Payer: Medicare Other | Admitting: *Deleted

## 2017-04-05 ENCOUNTER — Telehealth: Payer: Self-pay | Admitting: Nurse Practitioner

## 2017-04-05 DIAGNOSIS — E875 Hyperkalemia: Secondary | ICD-10-CM

## 2017-04-05 DIAGNOSIS — I5032 Chronic diastolic (congestive) heart failure: Secondary | ICD-10-CM

## 2017-04-05 LAB — BASIC METABOLIC PANEL
BUN / CREAT RATIO: 26 (ref 12–28)
BUN: 31 mg/dL — ABNORMAL HIGH (ref 8–27)
CHLORIDE: 104 mmol/L (ref 96–106)
CO2: 23 mmol/L (ref 18–29)
CREATININE: 1.17 mg/dL — AB (ref 0.57–1.00)
Calcium: 9.2 mg/dL (ref 8.7–10.3)
GFR calc Af Amer: 49 mL/min/{1.73_m2} — ABNORMAL LOW (ref >59)
GFR calc non Af Amer: 43 mL/min/{1.73_m2} — ABNORMAL LOW (ref >59)
GLUCOSE: 108 mg/dL — AB (ref 65–99)
Potassium: 6 mmol/L — ABNORMAL HIGH (ref 3.5–5.2)
Sodium: 135 mmol/L (ref 134–144)

## 2017-04-05 NOTE — Telephone Encounter (Signed)
Agree  Thank you, Richardson Dopp, PA-C    04/05/2017 10:03 PM

## 2017-04-05 NOTE — Telephone Encounter (Signed)
Received STAT result from [place] with Dr. Tamala Julian, DOD who advised patient continue Lasix 40 mg daily and avoid high K+ foods; he requests repeat bmet on Thursday 4/12. I reviewed high potassium foods and she states lately she has not been eating very much. She drinks 1 Ensure daily which has 390 mg K+. She denies using any salt substitutes. I advised her of need for repeat bmet on Thursday and she states she will come in if she can. I advised her of importance and she verbalized understanding.

## 2017-04-07 ENCOUNTER — Other Ambulatory Visit: Payer: Medicare Other | Admitting: *Deleted

## 2017-04-07 DIAGNOSIS — E875 Hyperkalemia: Secondary | ICD-10-CM | POA: Diagnosis not present

## 2017-04-08 ENCOUNTER — Telehealth: Payer: Self-pay | Admitting: *Deleted

## 2017-04-08 LAB — BASIC METABOLIC PANEL
BUN / CREAT RATIO: 24 (ref 12–28)
BUN: 34 mg/dL — ABNORMAL HIGH (ref 8–27)
CO2: 23 mmol/L (ref 18–29)
CREATININE: 1.39 mg/dL — AB (ref 0.57–1.00)
Calcium: 8.5 mg/dL — ABNORMAL LOW (ref 8.7–10.3)
Chloride: 102 mmol/L (ref 96–106)
GFR, EST AFRICAN AMERICAN: 40 mL/min/{1.73_m2} — AB (ref >59)
GFR, EST NON AFRICAN AMERICAN: 35 mL/min/{1.73_m2} — AB (ref >59)
Glucose: 81 mg/dL (ref 65–99)
POTASSIUM: 5.5 mmol/L — AB (ref 3.5–5.2)
SODIUM: 138 mmol/L (ref 134–144)

## 2017-04-08 NOTE — Telephone Encounter (Signed)
BMP results from 4/12 reviewed with Dr. Angelena Form and no changes needed.  Pt should continue to reduce dietary potassium. She states she is eating OK.  She will decrease Ensure from daily to 2-3 times per week.

## 2017-04-15 ENCOUNTER — Ambulatory Visit (HOSPITAL_COMMUNITY): Payer: Medicare Other | Attending: Cardiology

## 2017-04-15 ENCOUNTER — Telehealth: Payer: Self-pay | Admitting: *Deleted

## 2017-04-15 ENCOUNTER — Other Ambulatory Visit: Payer: Self-pay

## 2017-04-15 ENCOUNTER — Encounter: Payer: Self-pay | Admitting: Physician Assistant

## 2017-04-15 DIAGNOSIS — D649 Anemia, unspecified: Secondary | ICD-10-CM | POA: Diagnosis not present

## 2017-04-15 DIAGNOSIS — I110 Hypertensive heart disease with heart failure: Secondary | ICD-10-CM | POA: Insufficient documentation

## 2017-04-15 DIAGNOSIS — I5032 Chronic diastolic (congestive) heart failure: Secondary | ICD-10-CM

## 2017-04-15 DIAGNOSIS — I083 Combined rheumatic disorders of mitral, aortic and tricuspid valves: Secondary | ICD-10-CM | POA: Diagnosis not present

## 2017-04-15 DIAGNOSIS — I272 Pulmonary hypertension, unspecified: Secondary | ICD-10-CM | POA: Diagnosis not present

## 2017-04-15 DIAGNOSIS — I313 Pericardial effusion (noninflammatory): Secondary | ICD-10-CM | POA: Diagnosis not present

## 2017-04-15 DIAGNOSIS — I3139 Other pericardial effusion (noninflammatory): Secondary | ICD-10-CM

## 2017-04-15 LAB — ECHOCARDIOGRAM LIMITED
Area-P 1/2: 6.29 cm2
CHL CUP MV DEC (S): 120
CHL CUP PV REG GRAD DIAS: 12 mmHg
CHL CUP REG VEL DIAS: 170 cm/s
CHL CUP RV SYS PRESS: 145 mmHg
E/e' ratio: 17.88
EWDT: 120 ms
FS: 21 % — AB (ref 28–44)
IV/PV OW: 1.5
LA diam end sys: 40 mm
LADIAMINDEX: 2.61 cm/m2
LASIZE: 40 mm
LV PW d: 12 mm — AB (ref 0.6–1.1)
LV TDI E'LATERAL: 6.6
LV TDI E'MEDIAL: 3.45
LV e' LATERAL: 6.6 cm/s
LVEEAVG: 17.88
LVEEMED: 17.88
Lateral S' vel: 10.4 cm/s
MV Peak grad: 6 mmHg
MV pk E vel: 118 m/s
MVPKAVEL: 48.4 m/s
MVSPHT: 35 ms
P 1/2 time: 401 ms
Reg peak vel: 585 cm/s
TAPSE: 17.3 mm
TRMAXVEL: 585 cm/s

## 2017-04-15 NOTE — Telephone Encounter (Signed)
Pt notified of Limited Echo results by phone with verbal understanding to results given today. Pt advised to keep her appt with Dr. Angelena Form next week to discuss further. Pt agreeable to plan of care.

## 2017-04-15 NOTE — Telephone Encounter (Signed)
-----   Message from Liliane Shi, [place] sent at 04/15/2017  5:08 PM EDT ----- Please call the patient. The echocardiogram looks fairly stable when compared to the last study. Keep follow up with Dr. Lauree Chandler next week to discuss further. Please fax a copy of this study result to her PCP:  Mauricio Po, FNP  Thanks! Richardson Dopp, PA-C    04/15/2017 5:04 PM

## 2017-04-20 ENCOUNTER — Encounter (INDEPENDENT_AMBULATORY_CARE_PROVIDER_SITE_OTHER): Payer: Self-pay

## 2017-04-20 ENCOUNTER — Encounter: Payer: Self-pay | Admitting: Cardiovascular Disease

## 2017-04-20 ENCOUNTER — Ambulatory Visit (INDEPENDENT_AMBULATORY_CARE_PROVIDER_SITE_OTHER): Payer: Medicare Other | Admitting: Cardiovascular Disease

## 2017-04-20 VITALS — BP 120/52 | HR 73 | Ht 62.0 in | Wt 121.2 lb

## 2017-04-20 DIAGNOSIS — I5032 Chronic diastolic (congestive) heart failure: Secondary | ICD-10-CM

## 2017-04-20 DIAGNOSIS — I272 Pulmonary hypertension, unspecified: Secondary | ICD-10-CM | POA: Diagnosis not present

## 2017-04-20 DIAGNOSIS — I313 Pericardial effusion (noninflammatory): Secondary | ICD-10-CM

## 2017-04-20 DIAGNOSIS — I340 Nonrheumatic mitral (valve) insufficiency: Secondary | ICD-10-CM

## 2017-04-20 DIAGNOSIS — I351 Nonrheumatic aortic (valve) insufficiency: Secondary | ICD-10-CM

## 2017-04-20 DIAGNOSIS — I3139 Other pericardial effusion (noninflammatory): Secondary | ICD-10-CM

## 2017-04-20 DIAGNOSIS — I10 Essential (primary) hypertension: Secondary | ICD-10-CM

## 2017-04-20 NOTE — Progress Notes (Signed)
Chief Complaint  Patient presents with  . Chest Pain   History of Present Illness: 83 yo female with history with history of breast cancer, systolic LV dysfunction due to presumed non-ischemic cardiomyopathy who is here today for cardiac follow up. She was admitted to [facility] in July 2014 with CHF. Echo 07/27/13 with severe LVH, LVEF 40-45%, moderate AI, moderate MR, moderate TR. She was diuresed and discharged home 07/29/13. Echo 08/07/14 with LVEF=55-60%, mild to moderate AI and mild MR. She was seen in our office June 2017 by Truitt Merle, NP with dyspnea. She was not felt to be volume overloaded, actually with 24 lbs weight loss.  Admitted to [facility] July 2017 with chest pain and very minimal troponin elevation. Nuclear stress test with possible scar, no ischemia, LVEF=40%. Pt declined a cardiac cath. Echo July 2017 with normal LV function, normal wall motion, moderate AI, Moderate MR. She has been found to have ovarian cancer and a lung mass. She was admitted January 2018 after her lung biopsy with anemia. She was found to have a small pericardial effusion. Mild troponin elevation felt to be due to demand ischemia. She refused further workup of malignancy. She called here with c/o SOB, orthopnea and PND 03/29/17 and was seen by Richardson Dopp, PA-C. She was not felt to be volume overloaded. Echo 04/18/17 with LVEF=50-55%. Moderate AI. Severe MR, severe TR. Small pericardial effusion. She has severe pulmonary HTN. Her dyspnea was felt to be related to combination of chronic lung disease and heart disease. She continues to smoke.   She is here today for follow up. The patient denies any chest pain but she does have daily dyspnea with exertion. No palpitations, lower extremity edema, orthopnea, PND, dizziness, near syncope or syncope. She is still smoking one pack per day. Not interested in treatment for probably ovarian cancer.    Primary Care Physician:  Mauricio Po, FNP  Past Medical  History:  Diagnosis Date  . Aortic insufficiency   . Blood transfusion without reported diagnosis   . Breast cancer ([place])   . Cardiomyopathy ([place])   . CHF (congestive heart failure) ([place])   . History of echocardiogram    Echo 4/18: severe focal basal and mod conc LVH, EF 50-55, no RWMA, Gr 2 DD, mod AI, severe MR, mild LAE, mildly reduced RVSF, severe TR, mod PI, PASP 65, mild pericardial eff  . Hypertension   . Mitral regurgitation     Past Surgical History:  Procedure Laterality Date  . COLONOSCOPY N/A 06/22/2014   Procedure: COLONOSCOPY;  Surgeon: Jerene Bears, MD;  Location: [facility] ENDOSCOPY;  Service: Endoscopy;  Laterality: N/A;  . ENDOBRONCHIAL ULTRASOUND N/A 12/31/2016   Procedure: ENDOBRONCHIAL ULTRASOUND;  Surgeon: Juanito Doom, MD;  Location: [facility] ENDOSCOPY;  Service: Cardiopulmonary;  Laterality: N/A;  . ESOPHAGOGASTRODUODENOSCOPY N/A 06/22/2014   Procedure: ESOPHAGOGASTRODUODENOSCOPY (EGD);  Surgeon: Jerene Bears, MD;  Location: [facility];  Service: Endoscopy;  Laterality: N/A;  . MASTECTOMY Left     Current Outpatient Prescriptions  Medication Sig Dispense Refill  . amLODipine (NORVASC) 2.5 MG tablet Take 1 tablet (2.5 mg total) by mouth daily. 90 tablet 3  . aspirin EC 325 MG tablet Take 325 mg by mouth daily.    . ferrous gluconate (IRON 27) 240 (27 FE) MG tablet Take 1 tablet (240 mg total) by mouth 2 (two) times daily. 60 each 0  . furosemide (LASIX) 40 MG tablet Take 1 tablet (40 mg total) by mouth daily. [place]  tablet 11  . metoprolol tartrate (LOPRESSOR) 25 MG tablet Take 1 tablet (25 mg total) by mouth 2 (two) times daily. 180 tablet 3  . Multiple Vitamins-Minerals (CENTRUM SILVER 50+WOMEN) TABS Take 1 tablet by mouth daily.     No current facility-administered medications for this visit.     Allergies  Allergen Reactions  . Penicillins Anaphylaxis and Swelling    Has patient had a PCN reaction causing immediate rash, facial/tongue/throat swelling, SOB or  lightheadedness with hypotension: Yes Has patient had a PCN reaction causing severe rash involving mucus membranes or skin necrosis: Yes Has patient had a PCN reaction that required hospitalization Yes Has patient had a PCN reaction occurring within the last 10 years: No If all of the above answers are "NO", then may proceed with Cephalosporin use.     Social History   Social History  . Marital status: Widowed    Spouse name: N/A  . Number of children: 10  . Years of education: 5   Occupational History  . Retired    Social History Main Topics  . Smoking status: Current Some Day Smoker    Packs/day: 0.25    Years: 50.00    Types: Cigarettes  . Smokeless tobacco: Former Systems developer    Types: Snuff     Comment: per patient has not used snuff in a while (10/18/16)  . Alcohol use No  . Drug use: No  . Sexual activity: No   Other Topics Concern  . Not on file   Social History Narrative   Denies abuse and feels safe at home.     Family History  Problem Relation Age of Onset  . Hypertension Mother   . Cancer Father     Review of Systems:  As stated in the HPI and otherwise negative.   BP (!) 120/52   Pulse 73   Ht 5\' 2"  (1.575 m)   Wt 121 lb 3.2 oz (55 kg)   SpO2 98%   BMI 22.17 kg/m   Physical Examination: General: Well developed, well nourished, NAD  HEENT: OP clear, mucus membranes moist  SKIN: warm, dry. No rashes. Neuro: No focal deficits  Musculoskeletal: Muscle strength 5/5 all ext  Psychiatric: Mood and affect normal  Neck: No JVD, no carotid bruits, no thyromegaly, no lymphadenopathy.  Lungs:Clear bilaterally, no wheezes, rhonci, crackles Cardiovascular: Regular rate and rhythm. Systolic murmur. No gallops or rubs. Abdomen:Soft. Bowel sounds present. Non-tender.  Extremities: No lower extremity edema. Pulses are 2 + in the bilateral DP/PT.  Nuclear stress test 07/07/16: Perfusion: There is a moderate area of mildly decreased activity involving the  inferior wall of the left ventricular myocardium on both the rest and stress deep studies. No suggestion of reversible ischemia.  Wall Motion: Global hypokinesis particularly in the set him. No left ventricular dilation.  Left Ventricular Ejection Fraction: 44 %  End diastolic volume 202 ml  End systolic volume 67 ml  IMPRESSION: 1. No reversible ischemia.  Inferior wall attenuation or scar.  2. Global hypokinesis most marked in the septum.  3. Left ventricular ejection fraction 44%  4. Intermediate-risk stress test findings*.  Echo 04/15/17: Left ventricle: The cavity size was normal. There was severe   focal basal and moderate concentric hypertrophy of the left   ventricle. Systolic function was normal. The estimated ejection   fraction was in the range of 50% to 55%. Wall motion was normal;   there were no regional wall motion abnormalities. Features are   consistent  with a pseudonormal left ventricular filling pattern,   with concomitant abnormal relaxation and increased filling   pressure (grade 2 diastolic dysfunction). Doppler parameters are   consistent with elevated ventricular end-diastolic filling   pressure. - Aortic valve: There was moderate regurgitation. - Mitral valve: There was severe regurgitation. - Left atrium: The atrium was mildly dilated. - Right ventricle: The cavity size was mildly dilated. Wall   thickness was mildly increased. Systolic function was mildly   reduced. - Tricuspid valve: There was severe regurgitation. - Pulmonic valve: There was moderate regurgitation. - Pulmonary arteries: Systolic pressure was moderately to severely   increased. PA peak pressure: 65 mm Hg (S). - Inferior vena cava: The vessel was dilated. The respirophasic   diameter changes were blunted (< 50%), consistent with elevated   central venous pressure. - Pericardium, extracardiac: A mild pericardial effusion was   identified posterior to the heart. Features  were not consistent   with tamponade physiology.  EKG:  EKG is not ordered today. The ekg ordered today demonstrates    Recent Labs: 07/06/2016: TSH 0.038 01/01/2017: B Natriuretic Peptide 2,298.0 01/05/2017: ALT 15; Magnesium 2.1 01/17/2017: Hemoglobin 8.3 Repeated and verified X2. 04/01/2017: NT-Pro BNP 31,525; Platelets 270 04/07/2017: BUN 34; Creatinine, Ser 1.39; Potassium 5.5; Sodium 138   Lipid Panel    Component Value Date/Time   CHOL 101 07/06/2016 1954   TRIG 66 07/06/2016 1954   HDL 39 (L) 07/06/2016 1954   CHOLHDL 2.6 07/06/2016 1954   VLDL 13 07/06/2016 1954   LDLCALC 49 07/06/2016 1954     Wt Readings from Last 3 Encounters:  04/20/17 121 lb 3.2 oz (55 kg)  04/01/17 118 lb (53.5 kg)  01/28/17 122 lb 3.2 oz (55.4 kg)     Other studies Reviewed: Additional studies/ records that were reviewed today include: . Review of the above records demonstrates:   Assessment and Plan:   1. Mitral regurgitation: Severe by echo April 2018. She is not a candidate for surgical repair given advanced age and probable malignancy.   2. Aortic insufficiency: Moderate by echo April 2018. See above.   3. HTN: BP controlled. No changes today.   4. Probable ovarian cancer: She has a large ovarian mass and has refused workup. I am not entirely clear how this has transpired. Her lung mass appears to be benign, at least from what I have seen in the record. She has missed f/u with Pulmonary but is seeing [facility] next week. She is not interested in any aggressive treatment.   5. Dyspnea: Likely multifactorial with long term tobacco abuse (70 pack years), valvular heart disease, chronic diastolic CHF. She is seeing Pulmonary next week. I have nothing to offer from a cardiac standpoint except for continued use of Lasix to prevent volume overload. She is not having ischemic symptoms although she most certainly has underlying CAD. I will not offer invasive cardiac testing.   6. Chronic diastolic CHF:  Volume status is ok today. She is on daily Lasix.   7. Pulmonary HTN: Likely multifactorial. See above.   8. Pericardial effusion: Small by most recent echo. Clinically not significant.   Current medicines are reviewed at length with the patient today.  The patient does not have concerns regarding medicines.  The following changes have been made:  no change  Labs/ tests ordered today include:  No orders of the defined types were placed in this encounter.    Disposition:   FU with me in 6 months  Signed, Lauree Chandler, MD 04/20/2017 1:19 PM    [facility] [address], [place], [place]  [address] Phone: [phone]; Fax: [phone]

## 2017-04-20 NOTE — Patient Instructions (Signed)
Your physician recommends that you continue on your current medications as directed. Please refer to the Current Medication list given to you today.   Your physician wants you to follow-up in: 6 MONTHS WITH DR  MCALHANY You will receive a reminder letter in the mail two months in advance. If you don't receive a letter, please call our office to schedule the follow-up appointment. 

## 2017-04-28 ENCOUNTER — Ambulatory Visit (INDEPENDENT_AMBULATORY_CARE_PROVIDER_SITE_OTHER): Payer: Medicare Other | Admitting: Internal Medicine

## 2017-04-28 ENCOUNTER — Ambulatory Visit (INDEPENDENT_AMBULATORY_CARE_PROVIDER_SITE_OTHER)
Admission: RE | Admit: 2017-04-28 | Discharge: 2017-04-28 | Disposition: A | Discharge status: Home / Self Care | Payer: Medicare Other | Source: Ambulatory Visit | Attending: Internal Medicine | Admitting: Internal Medicine

## 2017-04-28 ENCOUNTER — Other Ambulatory Visit (INDEPENDENT_AMBULATORY_CARE_PROVIDER_SITE_OTHER): Payer: Medicare Other

## 2017-04-28 ENCOUNTER — Encounter: Payer: Self-pay | Admitting: Internal Medicine

## 2017-04-28 VITALS — BP 112/60 | HR 80 | Ht 62.0 in | Wt 123.2 lb

## 2017-04-28 DIAGNOSIS — R0609 Other forms of dyspnea: Secondary | ICD-10-CM

## 2017-04-28 DIAGNOSIS — R911 Solitary pulmonary nodule: Secondary | ICD-10-CM | POA: Diagnosis not present

## 2017-04-28 DIAGNOSIS — F17210 Nicotine dependence, cigarettes, uncomplicated: Secondary | ICD-10-CM

## 2017-04-28 DIAGNOSIS — I509 Heart failure, unspecified: Secondary | ICD-10-CM | POA: Diagnosis not present

## 2017-04-28 DIAGNOSIS — D649 Anemia, unspecified: Secondary | ICD-10-CM | POA: Diagnosis not present

## 2017-04-28 LAB — BASIC METABOLIC PANEL
BUN: 30 mg/dL — AB (ref 6–23)
CALCIUM: 9.1 mg/dL (ref 8.4–10.5)
CHLORIDE: 107 meq/L (ref 96–112)
CO2: 24 mEq/L (ref 19–32)
CREATININE: 1.45 mg/dL — AB (ref 0.40–1.20)
GFR: 44.15 mL/min — AB (ref >60.00)
Glucose, Bld: 122 mg/dL — ABNORMAL HIGH (ref 70–99)
Potassium: 4.8 mEq/L (ref 3.5–5.1)
Sodium: 137 mEq/L (ref 135–145)

## 2017-04-28 LAB — HEPATIC FUNCTION PANEL
ALBUMIN: 2.9 g/dL — AB (ref 3.5–5.2)
ALT: 11 U/L (ref 0–35)
AST: 20 U/L (ref 0–37)
Alkaline Phosphatase: 93 U/L (ref 39–117)
BILIRUBIN TOTAL: 0.4 mg/dL (ref 0.2–1.2)
Bilirubin, Direct: 0.1 mg/dL (ref 0.0–0.3)
Total Protein: 7.2 g/dL (ref 6.0–8.3)

## 2017-04-28 LAB — CBC WITH DIFFERENTIAL/PLATELET
Basophils Absolute: 0.1 10*3/uL (ref 0.0–0.1)
Basophils Relative: 0.9 % (ref 0.0–3.0)
EOS PCT: 0.9 % (ref 0.0–5.0)
Eosinophils Absolute: 0.1 10*3/uL (ref 0.0–0.7)
HCT: 24.7 % — ABNORMAL LOW (ref 36.0–46.0)
Hemoglobin: 7.7 g/dL — CL (ref 12.0–15.0)
LYMPHS ABS: 1.4 10*3/uL (ref 0.7–4.0)
Lymphocytes Relative: 17.7 % (ref 12.0–46.0)
MCHC: 31.3 g/dL (ref 30.0–36.0)
MCV: 90 fl (ref 78.0–100.0)
MONO ABS: 0.6 10*3/uL (ref 0.1–1.0)
Monocytes Relative: 7.8 % (ref 3.0–12.0)
NEUTROS ABS: 5.7 10*3/uL (ref 1.4–7.7)
NEUTROS PCT: 72.7 % (ref 43.0–77.0)
PLATELETS: 339 10*3/uL (ref 150.0–400.0)
RBC: 2.74 Mil/uL — AB (ref 3.87–5.11)
RDW: 15.1 % (ref 11.5–15.5)
WBC: 7.9 10*3/uL (ref 4.0–10.5)

## 2017-04-28 LAB — TSH: TSH: 1.14 u[IU]/mL (ref 0.35–4.50)

## 2017-04-28 LAB — SEDIMENTATION RATE: SED RATE: 105 mm/h — AB (ref 0–30)

## 2017-04-28 LAB — BRAIN NATRIURETIC PEPTIDE: PRO B NATRI PEPTIDE: 3325 pg/mL — AB (ref 0.0–100.0)

## 2017-04-28 NOTE — Progress Notes (Signed)
@Patient  ID: Valerie Downs, female    DOB: [birth year]    MRN: [id number]     HPI: 83 yo female active smoker with hx of breast cancer (s/p mastectomy 1984) , ovarian mass and enlarging lung mass /LAN and    TEST  Bronchoscopy 12/31/16 >> atypical cells 4R node, benign tissue 7 node CT angio chest 01/01/17 >> pericardial effusion, 3.2 cm mass RLL, 5 mm nodule Lt apex Echo 01/02/17 >> severe LVH, EF 50 to 55%, mod AR, mild MR, severe LA dilation, mod RA dilation, PAS 83 mmHg, small pericardial effusion  01/28/2017 NP Post hospital follow up : Lung mass    underwent EBUS on 12/31/16 . She had chest pressure and increased sob on 1/6 and was admitted to hospital for  Sx anemia and pericardial effusion. ECHO done and showed EF 50-55%, Low normal LV systolic function; severe LVH; biatrial enlargement; moderate AI; mild MR and TR; severely elevatedpulmonary pressure; small pericardial effusion. EBUS results were nondiagnostic. .Was discussed that further testing may be indicated. She declined.Tami Ribas care consult . She declined hospice.  She declines further testing and hospice currently.  Finished up PT at home. Advised her if she changes her mind to call us back rec Follow up with Dr. Melvyn Novas  In 3 months and As needed   Call back if you change your mind on further testing /treatment or on hospice .     04/28/2017  f/u ov/Kristell Wooding re: lung mass / chf  Jerold Coombe brought her and corraborates hx  Chief Complaint  Patient presents with  . Follow-up    Breathing is unchanged. She has the sensation mucus is in her throat and has occ dry cough.   doe = MMRC2 = can't walk a nl pace on a flat grade s sob but does fine slow and flat eg  does ok with shopping / breathing is ok but weak/feels both legs get tight  with mild swelling in am and worse as day goes on Severe night sweats x months worse with warmer weather, does not have a/c   No obvious day to day or daytime variability or assoc excess/ purulent  sputum or mucus plugs or hemoptysis or cp or chest tightness, subjective wheeze or overt sinus or hb symptoms. No unusual exp hx or h/o childhood pna/ asthma or knowledge of premature birth.  Sleeping ok without nocturnal  or early am exacerbation  of respiratory  c/o's or need for noct saba. Also denies any obvious fluctuation of symptoms with weather or environmental changes or other aggravating or alleviating factors except as outlined above   Current Medications, Allergies, Complete Past Medical History, Past Surgical History, Family History, and Social History were reviewed in [employer] record.  ROS  The following are not active complaints unless bolded sore throat, dysphagia, dental problems, itching, sneezing,  nasal congestion or excess/ purulent secretions, ear ache,   fever, chills, sweats, unintended wt loss, classically pleuritic or exertional cp,  orthopnea pnd or leg swelling, presyncope, palpitations, abdominal pain, anorexia, nausea, vomiting, diarrhea  or change in bowel or bladder habits, change in stools or urine, dysuria,hematuria,  rash, arthralgias, visual complaints, headache, numbness, weakness or ataxia or problems with walking or coordination,  change in mood/affect or memory.            Physical Exam     GEN: A/Ox3; pleasant , NAD, thin frail stoic black  female   Wt Readings from Last 3 Encounters:  04/28/17 123 lb 3.2 oz (55.9 kg)  04/20/17 121 lb 3.2 oz (55 kg)  04/01/17 118 lb (53.5 kg)    Vital signs reviewed      - Note on arrival 02 sats  99% on RA     HEENT:  /AT,  EACs-clear, TMs-wnl, NOSE-clear, THROAT-clear, no lesions, no postnasal drip or exudate noted.   NECK:  Supple w/ fair ROM; no JVD; normal carotid impulses w/o bruits; no thyromegaly or nodules palpated; no lymphadenopathy.    RESP  Minimal dullness to percussion R base, clear to A and P otherwise   CARD:  RRR, no m/r/g, no peripheral edema, pulses intact, no  cyanosis or clubbing.  GI:   Soft & nt; nml bowel sounds; no organomegaly or masses detected.   Musco: Warm bil, no deformities or joint swelling noted.   Neuro: alert, no focal deficits noted.    Skin: Warm, no lesions or rashes     CXR PA and Lateral:   04/28/2017 :    I personally reviewed images and agree with radiology impression as follows:   1. Cardiomegaly with normal pulmonary vascularity. 2. Low lung volumes with mild bibasilar atelectasis. Small right pleural effusion cannot be excluded.   Labs ordered/ reviewed:      Chemistry      Component Value Date/Time   NA 137 04/28/2017 1054   NA 138 04/07/2017 1450   K 4.8 04/28/2017 1054   CL 107 04/28/2017 1054   CO2 24 04/28/2017 1054   BUN 30 (H) 04/28/2017 1054   BUN 34 (H) 04/07/2017 1450   CREATININE 1.45 (H) 04/28/2017 1054      Component Value Date/Time   CALCIUM 9.1 04/28/2017 1054   ALKPHOS 93 04/28/2017 1054   AST 20 04/28/2017 1054   ALT 11 04/28/2017 1054   BILITOT 0.4 04/28/2017 1054        Lab Results  Component Value Date   WBC 7.9 04/28/2017   HGB 7.7 Repeated and verified X2. (LL) 04/28/2017   HCT 24.7 (L) 04/28/2017   MCV 90.0 04/28/2017   PLT 339.0 04/28/2017         Lab Results  Component Value Date   TSH 1.14 04/28/2017     Lab Results  Component Value Date   PROBNP 3,325.0 (H) 04/28/2017       Lab Results  Component Value Date   ESRSEDRATE 105 (H) 04/28/2017                 Assessment & Plan:

## 2017-04-28 NOTE — Patient Instructions (Addendum)
Please remember to go to the lab and x-ray department downstairs in the basement  for your tests - we will call you with the results when they are available.  The key is to stop smoking completely before smoking completely stops you!   Please schedule a follow up office visit in 4 weeks, sooner if needed  Add: instruct to reduce Asa to 81 mg daily enteric, offer heme-onc consult if she'll go

## 2017-04-29 ENCOUNTER — Other Ambulatory Visit: Payer: Self-pay | Admitting: Internal Medicine

## 2017-04-29 DIAGNOSIS — D508 Other iron deficiency anemias: Secondary | ICD-10-CM

## 2017-04-29 NOTE — Progress Notes (Signed)
Spoke with pt and notified of results per Dr. Wert. Pt verbalized understanding and denied any questions. 

## 2017-04-29 NOTE — Assessment & Plan Note (Addendum)
PET 09/08/16  The 2 cm right lower lobe pulmonary lesion in the azygoesophageal recess is hypermetabolic with SUV max of 51.0. This is consistent with neoplasm. There are also hypermetabolic right paratracheal and left prevascular nodes. The right-sided paratracheal node measures 13 mm and SUV max is 5.7. The aorticopulmonary window node measures 10.5 mm and SUV max is 5.3. Note also large PET Pos L  ovarian mass  - Bronchoscopy 12/31/16 >> atypical cells 4R node, benign tissue 7 node   Discussed with pt and grandson Lanny Hurst, does not wish any further bx's at this time as these lesions are not causing any pain or for that matter any of her active symptoms (? Anemia of malignancy role?) but I suspect she has low grade lung ca and ovarian ca and these are completely unrelated to the previous h/o breast ca though certainly can't be sure of this s tissue dx.  Her chf really appears to be her immediate life limiting problem.  Discussed in detail all the  indications, usual  risks and alternatives  relative to the benefits with patient who agrees to proceed with conservative f/u as outlined    I had an extended discussion with the patient reviewing all relevant studies completed to date and  lasting 25 minutes of a 40  minute office  visit for purpose of re-establishing with me  re  severe non-specific but potentially very serious refractory respiratory symptoms of uncertain and potentially multiple  etiologies.   Each maintenance medication was reviewed in detail including most importantly the difference between maintenance and prns and under what circumstances the prns are to be triggered using an action plan format that is not reflected in the computer generated alphabetically organized AVS.    Please see AVS for specific instructions unique to this office visit that I personally wrote and verbalized to the the pt in detail and then reviewed with pt  by my nurse highlighting any changes in therapy/plan  of care  recommended at today's visit.

## 2017-04-29 NOTE — Assessment & Plan Note (Addendum)
  Lab Results  Component Value Date   HGB 7.7 Repeated and verified X2. (LL) 04/28/2017   HGB 8.3 Repeated and verified X2. (L) 01/17/2017   HGB 9.6 (L) 01/05/2017   Previously she was borderline fe def and note she is on asa 325 but  Anemia is normocytic -  It  certainly complicates her chf but no active bleeding or opportunity for intervention at this point. Would consider empirically adding FE, reducing asa to 81 mg enteric and  referring to heme onc if she'll go.   see avs for instructions unique to this ov

## 2017-04-29 NOTE — Assessment & Plan Note (Signed)

## 2017-04-29 NOTE — Assessment & Plan Note (Signed)
Echo 07/07/16 Left ventricle: The cavity size was normal. There was severe   focal basal and moderate concentric hypertrophy of the left   ventricle . Systolic function was normal. The estimated ejection   fraction was in the range of 60% to 65%. Wall motion was normal;   there were no regional wall motion abnormalities. Doppler   parameters are consistent with abnormal left ventricular   relaxation (grade 1 diastolic dysfunction). Doppler parameters   are consistent with elevated ventricular end-diastolic filling   pressure. - Aortic valve: There was moderate regurgitation. - Aortic root: The aortic root was mildly dilated measuring 40 mm. - Mitral valve: Calcified annulus. Mildly thickened leaflets .   There was moderate regurgitation. - Left atrium: The atrium was severely dilated. - Tricuspid valve: There was mild regurgitation. - Pulmonic valve: There was mild regurgitation. - Pulmonary arteries: Systolic pressure was within the normal   range.  - Spirometry 10/12/2016  FEV1 1.38 (108%)  Ratio 66 with minimal curvature off all resp rx   - 10/12/2016   Walked RA  2 laps @ 185 ft each stopped due to  Ankle hurting, no cp or sob or desat  - Echo 04/15/17 no change    Her chf and anemia (see separate a/p) appear to be driving most of her symptoms, not copd or AB so nothing else to offer thru this clinic > cards f/u planned

## 2017-05-07 ENCOUNTER — Telehealth: Payer: Self-pay | Admitting: Hematology

## 2017-05-07 ENCOUNTER — Encounter: Payer: Self-pay | Admitting: Hematology

## 2017-05-07 NOTE — Telephone Encounter (Signed)
Appt has been scheduled for the pt to see Dr. Irene Limbo on 5/31 at 11am. Pt agreed to the appt date and time. Demographics verified. Letter mailed.

## 2017-05-19 ENCOUNTER — Inpatient Hospital Stay (HOSPITAL_COMMUNITY)
Admission: EM | Admit: 2017-05-19 | Discharge: 2017-05-21 | DRG: 811 | Disposition: A | Discharge status: Home / Self Care | Payer: Medicare Other | Attending: Internal Medicine | Admitting: Internal Medicine

## 2017-05-19 ENCOUNTER — Emergency Department (HOSPITAL_COMMUNITY): Payer: Medicare Other

## 2017-05-19 ENCOUNTER — Encounter (HOSPITAL_COMMUNITY): Payer: Self-pay | Admitting: Emergency Medicine

## 2017-05-19 DIAGNOSIS — D649 Anemia, unspecified: Secondary | ICD-10-CM | POA: Diagnosis not present

## 2017-05-19 DIAGNOSIS — N184 Chronic kidney disease, stage 4 (severe): Secondary | ICD-10-CM | POA: Diagnosis not present

## 2017-05-19 DIAGNOSIS — I272 Pulmonary hypertension, unspecified: Secondary | ICD-10-CM | POA: Diagnosis present

## 2017-05-19 DIAGNOSIS — F17210 Nicotine dependence, cigarettes, uncomplicated: Secondary | ICD-10-CM | POA: Diagnosis not present

## 2017-05-19 DIAGNOSIS — E43 Unspecified severe protein-calorie malnutrition: Secondary | ICD-10-CM | POA: Diagnosis present

## 2017-05-19 DIAGNOSIS — C569 Malignant neoplasm of unspecified ovary: Secondary | ICD-10-CM | POA: Diagnosis present

## 2017-05-19 DIAGNOSIS — N179 Acute kidney failure, unspecified: Secondary | ICD-10-CM | POA: Diagnosis not present

## 2017-05-19 DIAGNOSIS — Z515 Encounter for palliative care: Secondary | ICD-10-CM | POA: Diagnosis not present

## 2017-05-19 DIAGNOSIS — R7989 Other specified abnormal findings of blood chemistry: Secondary | ICD-10-CM | POA: Diagnosis present

## 2017-05-19 DIAGNOSIS — D62 Acute posthemorrhagic anemia: Principal | ICD-10-CM | POA: Diagnosis present

## 2017-05-19 DIAGNOSIS — R9431 Abnormal electrocardiogram [ECG] [EKG]: Secondary | ICD-10-CM | POA: Diagnosis not present

## 2017-05-19 DIAGNOSIS — I5033 Acute on chronic diastolic (congestive) heart failure: Secondary | ICD-10-CM

## 2017-05-19 DIAGNOSIS — I5032 Chronic diastolic (congestive) heart failure: Secondary | ICD-10-CM | POA: Diagnosis present

## 2017-05-19 DIAGNOSIS — K922 Gastrointestinal hemorrhage, unspecified: Secondary | ICD-10-CM | POA: Diagnosis not present

## 2017-05-19 DIAGNOSIS — E875 Hyperkalemia: Secondary | ICD-10-CM | POA: Diagnosis not present

## 2017-05-19 DIAGNOSIS — I5043 Acute on chronic combined systolic (congestive) and diastolic (congestive) heart failure: Secondary | ICD-10-CM | POA: Diagnosis not present

## 2017-05-19 DIAGNOSIS — D500 Iron deficiency anemia secondary to blood loss (chronic): Secondary | ICD-10-CM | POA: Diagnosis not present

## 2017-05-19 DIAGNOSIS — R110 Nausea: Secondary | ICD-10-CM | POA: Diagnosis not present

## 2017-05-19 DIAGNOSIS — I130 Hypertensive heart and chronic kidney disease with heart failure and stage 1 through stage 4 chronic kidney disease, or unspecified chronic kidney disease: Secondary | ICD-10-CM | POA: Diagnosis present

## 2017-05-19 DIAGNOSIS — Z6821 Body mass index (BMI) 21.0-21.9, adult: Secondary | ICD-10-CM

## 2017-05-19 DIAGNOSIS — R531 Weakness: Secondary | ICD-10-CM | POA: Diagnosis not present

## 2017-05-19 DIAGNOSIS — R195 Other fecal abnormalities: Secondary | ICD-10-CM | POA: Diagnosis present

## 2017-05-19 DIAGNOSIS — I10 Essential (primary) hypertension: Secondary | ICD-10-CM | POA: Diagnosis present

## 2017-05-19 LAB — CBC WITH DIFFERENTIAL/PLATELET
BASOS ABS: 0 10*3/uL (ref 0.0–0.1)
BASOS PCT: 1 %
Eosinophils Absolute: 0.2 10*3/uL (ref 0.0–0.7)
Eosinophils Relative: 4 %
HEMATOCRIT: 20.8 % — AB (ref 36.0–46.0)
Hemoglobin: 6.2 g/dL — CL (ref 12.0–15.0)
LYMPHS PCT: 32 %
Lymphs Abs: 2 10*3/uL (ref 0.7–4.0)
MCH: 26.7 pg (ref 26.0–34.0)
MCHC: 29.8 g/dL — ABNORMAL LOW (ref 30.0–36.0)
MCV: 89.7 fL (ref 78.0–100.0)
Monocytes Absolute: 0.6 10*3/uL (ref 0.1–1.0)
Monocytes Relative: 10 %
NEUTROS ABS: 3.4 10*3/uL (ref 1.7–7.7)
NEUTROS PCT: 54 %
Platelets: 305 10*3/uL (ref 150–400)
RBC: 2.32 MIL/uL — AB (ref 3.87–5.11)
RDW: 15.6 % — ABNORMAL HIGH (ref 11.5–15.5)
WBC: 6.2 10*3/uL (ref 4.0–10.5)

## 2017-05-19 LAB — BASIC METABOLIC PANEL
Anion gap: 9 (ref 5–15)
BUN: 54 mg/dL — AB (ref 6–20)
CALCIUM: 8.9 mg/dL (ref 8.9–10.3)
CHLORIDE: 108 mmol/L (ref 101–111)
CO2: 20 mmol/L — AB (ref 22–32)
CREATININE: 1.74 mg/dL — AB (ref 0.44–1.00)
GFR calc Af Amer: 30 mL/min — ABNORMAL LOW (ref >60)
GFR calc non Af Amer: 26 mL/min — ABNORMAL LOW (ref >60)
Glucose, Bld: 87 mg/dL (ref 65–99)
Potassium: 5.7 mmol/L — ABNORMAL HIGH (ref 3.5–5.1)
Sodium: 137 mmol/L (ref 135–145)

## 2017-05-19 LAB — POC OCCULT BLOOD, ED: Fecal Occult Bld: POSITIVE — AB

## 2017-05-19 LAB — BRAIN NATRIURETIC PEPTIDE: B Natriuretic Peptide: 4112.4 pg/mL — ABNORMAL HIGH (ref 0.0–100.0)

## 2017-05-19 LAB — I-STAT TROPONIN, ED: TROPONIN I, POC: 0.06 ng/mL (ref 0.00–0.08)

## 2017-05-19 LAB — CBG MONITORING, ED: Glucose-Capillary: 81 mg/dL (ref 65–99)

## 2017-05-19 LAB — PREPARE RBC (CROSSMATCH)

## 2017-05-19 MED ORDER — ONDANSETRON HCL 4 MG PO TABS: 4.0000 mg | ORAL_TABLET | Freq: Four times a day (QID) | ORAL | Status: DC | PRN

## 2017-05-19 MED ORDER — IPRATROPIUM-ALBUTEROL 0.5-2.5 (3) MG/3ML IN SOLN: 3.0000 mL | Freq: Four times a day (QID) | RESPIRATORY_TRACT | Status: DC | PRN

## 2017-05-19 MED ORDER — BOOST / RESOURCE BREEZE PO LIQD: 1.0000 | Freq: Three times a day (TID) | ORAL | Status: DC

## 2017-05-19 MED ORDER — SODIUM CHLORIDE 0.9 % IV SOLN
Freq: Once | INTRAVENOUS | Status: AC
  Administered 2017-05-19: 18:00:00 via INTRAVENOUS

## 2017-05-19 MED ORDER — PANTOPRAZOLE SODIUM 40 MG IV SOLR
40.0000 mg | Freq: Two times a day (BID) | INTRAVENOUS | Status: DC
  Administered 2017-05-19 – 2017-05-21 (×4): 40 mg via INTRAVENOUS
  Filled 2017-05-19 (×4): qty 40

## 2017-05-19 MED ORDER — ONDANSETRON HCL 4 MG/2ML IJ SOLN: 4.0000 mg | Freq: Four times a day (QID) | INTRAMUSCULAR | Status: DC | PRN

## 2017-05-19 MED ORDER — METOPROLOL TARTRATE 5 MG/5ML IV SOLN
2.5000 mg | Freq: Four times a day (QID) | INTRAVENOUS | Status: DC | PRN
  Administered 2017-05-19: 2.5 mg via INTRAVENOUS
  Filled 2017-05-19: qty 5

## 2017-05-19 MED ORDER — FUROSEMIDE 10 MG/ML IJ SOLN
20.0000 mg | Freq: Once | INTRAMUSCULAR | Status: AC
  Administered 2017-05-19: 20 mg via INTRAVENOUS
  Filled 2017-05-19: qty 4

## 2017-05-19 NOTE — ED Notes (Signed)
CONSENT FOR BLOOD SIGNED AND WITNESSED. 

## 2017-05-19 NOTE — ED Triage Notes (Signed)
Pt c/o weakness and intermittent nausea x approximately one month. Pt states she has loss of appetite and is "just feeling miserable". Pt also c/o edema to L foot.

## 2017-05-19 NOTE — ED Notes (Signed)
PT REFUSED TO HAVE BLOOD DRAWN.

## 2017-05-19 NOTE — H&P (Signed)
History and Physical    Valerie Downs JJK:[id number] DOB: 07/28/1930 DOA: 05/19/2017  PCP: [facility], FNP   Patient coming from: Home.  I have personally briefly reviewed patient's old medical records in [place]  Chief Complaint: Weakness and decreased appetite for one month  HPI: Valerie Downs is a 83 y.o. female with medical history significant of aortic insufficiency, mitral regurgitation, chronic blood loss anemia, chronic diastolic CHF, hypertension, chronic kidney disease, active smoker who is coming to the emergency department with complaints of progressively worse weakness for about one month associated with chills, fatigue, malaise, shortness of breath, occasionally productive cough of yellowish/brownish sputum, intermittent nausea, decreased appetite, postural dizziness and lower extremity edema. She states that she feels "just miserable". She denies fever, chest pain, diaphoresis, palpitations, abdominal pain, diarrhea, emesis, melena or hematochezia. She denies dysuria or hematuria. She denies pruritus or skin rashes.  ED Course: Workup in the emergency department shows WBC of 6.2, hemoglobin is 6.2 g/dL and platelets 305. Her last hemoglobin level earlier this month was 7.7 g/dL. Sodium 137, potassium 5.7, chloride 108, bicarbonate 20 mmol/L. BUN was 54, creatinine 1.74 and glucose 87 mg/dL. Her last creatinine was 1.45 mg/dL. EKG showed sinus rhythm, borderline prolonged PR and QT intervals, and normal R-wave progression and LVH. Her BNP was 4112 and earlier this month was 3325 pg/mL. Fecal occult blood was positive, but the patient is taking iron supplements. Her chest radiograph shows mild vascular congestion. Furosemide 20 mg IVP 1 dose was ordered in the emergency department.  Review of Systems: As per HPI otherwise 10 point review of systems negative.    Past Medical History:  Diagnosis Date  . Aortic insufficiency   . Blood transfusion without reported  diagnosis   . Breast cancer ([place])   . Cardiomyopathy ([place])   . CHF (congestive heart failure) ([place])   . History of echocardiogram    Echo 4/18: severe focal basal and mod conc LVH, EF 50-55, no RWMA, Gr 2 DD, mod AI, severe MR, mild LAE, mildly reduced RVSF, severe TR, mod PI, PASP 65, mild pericardial eff  . Hypertension   . Mitral regurgitation     Past Surgical History:  Procedure Laterality Date  . COLONOSCOPY N/A 06/22/2014   Procedure: COLONOSCOPY;  Surgeon: Jerene Bears, MD;  Location: [facility] END[facility]COPY;  Service: Endoscopy;  Laterality: N/A;  . ENDOBRONCHIAL ULTRASOUND N/A 12/31/2016   Procedure: ENDOBRONCHIAL ULTRASOUND;  Surgeon: Juanito Doom, MD;  Location: [facility] ENDOSCOPY;  Service: Cardiopulmonary;  Laterality: N/A;  . ESOPHAGOGASTRODUODENOSCOPY N/A 06/22/2014   Procedure: ESOPHAGOGASTRODUODENOSCOPY (EGD);  Surgeon: Jerene Bears, MD;  Location: [facility];  Service: Endoscopy;  Laterality: N/A;  . MASTECTOMY Left      reports that she has been smoking Cigarettes.  She has a 12.50 pack-year smoking history. She has quit using smokeless tobacco. Her smokeless tobacco use included Snuff. She reports that she does not drink alcohol or use drugs.  Allergies  Allergen Reactions  . Penicillins Anaphylaxis and Swelling    Has patient had a PCN reaction causing immediate rash, facial/tongue/throat swelling, SOB or lightheadedness with hypotension: Yes Has patient had a PCN reaction causing severe rash involving mucus membranes or skin necrosis: Yes Has patient had a PCN reaction that required hospitalization Yes Has patient had a PCN reaction occurring within the last 10 years: No If all of the above answers are "NO", then may proceed with Cephalosporin use.     Family History  Problem  Relation Age of Onset  . Hypertension Mother   . Cancer Father     Prior to Admission medications   Medication Sig Start Date End Date Taking? Authorizing Provider  amLODipine (NORVASC) 2.5 MG  tablet Take 1 tablet (2.5 mg total) by mouth daily. 04/01/17 06/30/17 Yes Weaver, Scott T, PA-C  aspirin EC 325 MG tablet Take 325 mg by mouth daily.   Yes [provider]  ferrous gluconate (IRON 27) 240 (27 FE) MG tablet Take 1 tablet (240 mg total) by mouth 2 (two) times daily. 01/06/17  Yes Short, Noah Delaine, MD  furosemide (LASIX) 40 MG tablet Take 1 tablet (40 mg total) by mouth daily. 04/03/17  Yes Weaver, Scott T, PA-C  metoprolol tartrate (LOPRESSOR) 25 MG tablet Take 1 tablet (25 mg total) by mouth 2 (two) times daily. 06/09/16  Yes Burtis Junes, NP  Multiple Vitamins-Minerals (CENTRUM SILVER 50+WOMEN) TABS Take 1 tablet by mouth daily.   Yes [provider]    Physical Exam: Vitals:   05/19/17 1810 05/19/17 1824 05/19/17 1841 05/19/17 2019  BP: 134/70  (!) 150/67 (!) 156/62  Pulse: 82 72 72 75  Resp: 20 20 13 20   Temp: 98.7 F (37.1 C)  98.4 F (36.9 C) 99.1 F (37.3 C)  TempSrc: Oral  Oral Oral  SpO2: 98%  97% 100%  Weight:    54.2 kg (119 lb 7.8 oz)  Height:    5\' 2"  (1.575 m)    Constitutional: NAD, calm, comfortable Eyes: PERRL, lids and conjunctivae are pale. ENMT: Mucous membranes are moist. Posterior pharynx clear of any exudate or lesions.Dentition in poor state of repair. Neck: normal, supple, no masses, no thyromegaly Respiratory: Bibasilar rales, mild rhonchi, no wheezing, no crackles. Normal respiratory effort. No accessory muscle use.  Cardiovascular: Regular rate and rhythm, +1/6 mid diastolic murmur, no rubs / gallops. 1+ bilateral lower extremity edema. 2+ pedal pulses. No carotid bruits.  Abdomen: Soft, no tenderness, no masses palpated. No hepatosplenomegaly. Bowel sounds positive.  Musculoskeletal: Mild clubbing, no cyanosis. Good ROM, no contractures. Normal muscle tone.  Skin: Positive pallor, no rashes, lesions, ulcers on limited skin exam. No induration Neurologic: CN 2-12 grossly intact. Sensation intact, DTR normal. Strength 5/5 in all  4.  Psychiatric: Normal judgment and insight. Alert and oriented x 4. Normal mood.    Labs on Admission: I have personally reviewed following labs and imaging studies  CBC:  Recent Labs Lab 05/19/17 1621  WBC 6.2  NEUTROABS 3.4  HGB 6.2*  HCT 20.8*  MCV 89.7  PLT 109   Basic Metabolic Panel:  Recent Labs Lab 05/19/17 1621  NA 137  K 5.7*  CL 108  CO2 20*  GLUCOSE 87  BUN 54*  CREATININE 1.74*  CALCIUM 8.9   GFR: Estimated Creatinine Clearance: 19 mL/min (A) (by C-G formula based on SCr of 1.74 mg/dL (H)). Liver Function Tests: No results for input(s): AST, ALT, ALKPHOS, BILITOT, PROT, ALBUMIN in the last 168 hours. No results for input(s): LIPASE, AMYLASE in the last 168 hours. No results for input(s): AMMONIA in the last 168 hours. Coagulation Profile: No results for input(s): INR, PROTIME in the last 168 hours. Cardiac Enzymes: No results for input(s): CKTOTAL, CKMB, CKMBINDEX, TROPONINI in the last 168 hours. BNP (last 3 results)  Recent Labs  04/01/17 1142 04/28/17 1054  PROBNP 31,525* 3,325.0*   HbA1C: No results for input(s): HGBA1C in the last 72 hours. CBG:  Recent Labs Lab 05/19/17 1441  GLUCAP 81  Lipid Profile: No results for input(s): CHOL, HDL, LDLCALC, TRIG, CHOLHDL, LDLDIRECT in the last 72 hours. Thyroid Function Tests: No results for input(s): TSH, T4TOTAL, FREET4, T3FREE, THYROIDAB in the last 72 hours. Anemia Panel: No results for input(s): VITAMINB12, FOLATE, FERRITIN, TIBC, IRON, RETICCTPCT in the last 72 hours. Urine analysis:    Component Value Date/Time   COLORURINE YELLOW 01/01/2017 1713   APPEARANCEUR CLEAR 01/01/2017 1713   LABSPEC 1.025 01/01/2017 1713   PHURINE 5.5 01/01/2017 1713   GLUCOSEU NEGATIVE 01/01/2017 1713   HGBUR NEGATIVE 01/01/2017 1713   BILIRUBINUR NEGATIVE 01/01/2017 1713   KETONESUR NEGATIVE 01/01/2017 1713   PROTEINUR 100 (A) 01/01/2017 1713   UROBILINOGEN 0.2 08/16/2014 0535   NITRITE  NEGATIVE 01/01/2017 1713   LEUKOCYTESUR TRACE (A) 01/01/2017 1713    Radiological Exams on Admission: Dg Chest 2 View  Result Date: 05/19/2017 CLINICAL DATA:  Weakness and nausea for 1 month EXAM: CHEST  2 VIEW COMPARISON:  04/28/2017 FINDINGS: Cardiac shadow remains enlarged. The overall inspiratory effort is stable. Mild vascular congestion is seen without definitive interstitial edema. No focal infiltrate is seen. Degenerative changes of the thoracic spine are noted. IMPRESSION: Mild vascular congestion. Electronically Signed   By: Inez Catalina M.D.   On: 05/19/2017 15:53  04/15/2017 echocardiogram limited ------------------------------------------------------------------- LV EF: 50% -   55%  ------------------------------------------------------------------- Indications:      Pericardial effusion (I31.3). LIMITED for pericardial effusion follow up.  ------------------------------------------------------------------- History:   PMH:   Dyspnea.  Congestive heart failure.  Risk factors:  Pulmonary hypertension. Pericardial effusion. Anemia. Current tobacco use. Hypertension.  ------------------------------------------------------------------- Study Conclusions  - Left ventricle: The cavity size was normal. There was severe   focal basal and moderate concentric hypertrophy of the left   ventricle. Systolic function was normal. The estimated ejection   fraction was in the range of 50% to 55%. Wall motion was normal;   there were no regional wall motion abnormalities. Features are   consistent with a pseudonormal left ventricular filling pattern,   with concomitant abnormal relaxation and increased filling   pressure (grade 2 diastolic dysfunction). Doppler parameters are   consistent with elevated ventricular end-diastolic filling   pressure. - Aortic valve: There was moderate regurgitation. - Mitral valve: There was severe regurgitation. - Left atrium: The atrium was mildly  dilated. - Right ventricle: The cavity size was mildly dilated. Wall   thickness was mildly increased. Systolic function was mildly   reduced. - Tricuspid valve: There was severe regurgitation. - Pulmonic valve: There was moderate regurgitation. - Pulmonary arteries: Systolic pressure was moderately to severely   increased. PA peak pressure: 65 mm Hg (S). - Inferior vena cava: The vessel was dilated. The respirophasic   diameter changes were blunted (< 50%), consistent with elevated   central venous pressure. - Pericardium, extracardiac: A mild pericardial effusion was   identified posterior to the heart. Features were not consistent   with tamponade physiology.  Impressions:  - There is no significant difference since the prior study on   01/02/2017.d  EKG: Independently reviewed. Vent. rate 75 BPM PR interval * ms QRS duration 99 ms QT/QTc 444/496 ms P-R-T axes 39 -37 72 Sinus rhythm Borderline prolonged PR interval Abnormal R-wave progression, late transition Left ventricular hypertrophy Borderline prolonged QT interval  Assessment/Plan Principal Problem:   Chronic UGI bleed/Anemia Admit to telemetry/inpatient. May have clear liquids tonight. Nothing by mouth after midnight. Hold aspirin. Continue packed RBC transfusion. Follow-up hematocrit and hemoglobin in a.m. Gastroenterology evaluation in  the morning.  Active Problems:   Hyperkalemia. Received IV furosemide pre-transfusion and beta agonists bronchodilators. Follow up potassium level in the morning.      Acute on chronic diastolic CHF (congestive heart failure) (HCC) Received furosemide IV earlier. Hold metoprolol. Monitor intake, output and daily weights.    Cigarette smoker Declined nicotine replacement therapy.      HYPERTENSION, BENIGN SYSTEMIC Hold amlodipine, oral furosemide and metoprolol. Monitor blood pressure. Monitor BUN, creatinine and electrolytes.   DVT prophylaxis: SCDs. Code  Status: Full code. Family Communication:  Disposition Plan: Admit for packed RBC transfusion and GI evaluation in a.m. Consults called: Gastroenterology (Dr. Ardis Hughs). Admission status: Inpatient/telemetry.   Reubin Milan MD [facility] Pager [phone].  If 7PM-7AM, please contact night-coverage [email or web address] Password [facility]  05/19/2017, 8:37 PM

## 2017-05-19 NOTE — ED Provider Notes (Signed)
[facility] DEPT Provider Note   CSN: [id number] Arrival date & time: 05/19/17  1411     History   Chief Complaint Chief Complaint  Patient presents with  . Weakness  . Nausea  . Anorexia    HPI Valerie Downs is a 83 y.o. female who presents with generalized malaise, anorexia, and nausea. PMHx significant for CHF EF 50-55%, pulmonary HTN, CKD, lung mass (had a EBUS and biopsy which was non-diagnostic but refused further testing), symptomatic anemia requiring blood transfusions in the past, hx of breast and ovarian cancer. She states her symptoms have been going on a month and been progressively worse. Her son and a friend are at bedside. She lives at home with her other son. She also endorses SOB and mild cough which may be slightly worse than baseline. She also endorses leg swelling which is better than it was earlier in the week. She presents today because she told her family she wanted to go to the hospital due to progressive symptoms. She denies fever, recent illness, chest pain, abdominal pain, vomiting, diarrhea, dysuria. Blood work on 5/3 showed hgb 7.7 and BNP 3,325. She has been adherent to her Lasix. Colonoscopy in 2015 was normal. Endoscopy in 2015 showed mild gastritis. She is on Iron once daily.   Weakness  Associated symptoms include shortness of breath. Pertinent negatives include no chest pain and no vomiting.    Past Medical History:  Diagnosis Date  . Aortic insufficiency   . Blood transfusion without reported diagnosis   . Breast cancer ([place])   . Cardiomyopathy ([place])   . CHF (congestive heart failure) ([place])   . History of echocardiogram    Echo 4/18: severe focal basal and mod conc LVH, EF 50-55, no RWMA, Gr 2 DD, mod AI, severe MR, mild LAE, mildly reduced RVSF, severe TR, mod PI, PASP 65, mild pericardial eff  . Hypertension   . Mitral regurgitation     Patient Active Problem List   Diagnosis Date Noted  . Moderate to severe pulmonary hypertension ([place])  01/11/2017  . Chronic diastolic CHF (congestive heart failure) ([place]) 01/11/2017  . Goals of care, counseling/discussion   . Acute renal failure (ARF) ([place]) 01/02/2017  . Right lower lobe lung mass 01/02/2017  . Fever 01/02/2017  . Elevated troponin 01/02/2017  . Pericardial effusion 01/02/2017  . Elevated brain natriuretic peptide (BNP) level 01/02/2017  . Anemia 01/01/2017  . Medicare annual wellness visit, subsequent 11/04/2016  . Routine general medical examination at a health care facility 11/04/2016  . Cigarette smoker 09/17/2016  . Dyspnea on exertion 09/17/2016  . Solitary pulmonary nodule 09/16/2016  . Ovarian mass, left 07/07/2016  . Chest pain 07/06/2016  . AKI (acute kidney injury) ([place]) 08/16/2014  . GI bleeding 08/15/2014  . Rectal bleed 08/15/2014  . Heme positive stool 06/22/2014  . Symptomatic anemia 06/21/2014  . Iron deficiency anemia 06/21/2014  . Malnutrition of moderate degree ([place]) 07/28/2013  . UTI (lower urinary tract infection) 07/26/2013  . HYPERTENSION, BENIGN SYSTEMIC 02/23/2007    Past Surgical History:  Procedure Laterality Date  . COLONOSCOPY N/A 06/22/2014   Procedure: COLONOSCOPY;  Surgeon: Jerene Bears, MD;  Location: [facility];  Service: Endoscopy;  Laterality: N/A;  . ENDOBRONCHIAL ULTRASOUND N/A 12/31/2016   Procedure: ENDOBRONCHIAL ULTRASOUND;  Surgeon: Juanito Doom, MD;  Location: [facility];  Service: Cardiopulmonary;  Laterality: N/A;  . ESOPHAGOGASTRODUODENOSCOPY N/A 06/22/2014   Procedure: ESOPHAGOGASTRODUODENOSCOPY (EGD);  Surgeon: Jerene Bears, MD;  Location: [facility];  Service: Endoscopy;  Laterality: N/A;  . MASTECTOMY Left     OB History    Gravida Para Term Preterm AB Living   10 10 10          SAB TAB Ectopic Multiple Live Births           10       Home Medications    Prior to Admission medications   Medication Sig Start Date End Date Taking? Authorizing Provider  amLODipine (NORVASC) 2.5 MG tablet Take 1  tablet (2.5 mg total) by mouth daily. 04/01/17 06/30/17 Yes Weaver, Scott T, PA-C  aspirin EC 325 MG tablet Take 325 mg by mouth daily.   Yes [provider]  ferrous gluconate (IRON 27) 240 (27 FE) MG tablet Take 1 tablet (240 mg total) by mouth 2 (two) times daily. 01/06/17  Yes Short, Noah Delaine, MD  furosemide (LASIX) 40 MG tablet Take 1 tablet (40 mg total) by mouth daily. 04/03/17  Yes Weaver, Scott T, PA-C  metoprolol tartrate (LOPRESSOR) 25 MG tablet Take 1 tablet (25 mg total) by mouth 2 (two) times daily. 06/09/16  Yes Burtis Junes, NP  Multiple Vitamins-Minerals (CENTRUM SILVER 50+WOMEN) TABS Take 1 tablet by mouth daily.   Yes [provider]    Family History Family History  Problem Relation Age of Onset  . Hypertension Mother   . Cancer Father     Social History Social History  Substance Use Topics  . Smoking status: Current Some Day Smoker    Packs/day: 0.25    Years: 50.00    Types: Cigarettes  . Smokeless tobacco: Former Systems developer    Types: Snuff     Comment: per patient has not used snuff in a while (10/18/16)  . Alcohol use No     Allergies   Penicillins  Review of Systems Review of Systems  Constitutional: Positive for appetite change, fatigue (malaise) and unexpected weight change. Negative for chills and fever.  Respiratory: Positive for cough and shortness of breath.   Cardiovascular: Negative for chest pain.  Gastrointestinal: Positive for nausea. Negative for abdominal pain, diarrhea and vomiting.  Genitourinary: Negative for dysuria.  Neurological: Negative for facial asymmetry and weakness.  All other systems reviewed and are negative.   Physical Exam Updated Vital Signs BP 134/65 (BP Location: Left Arm)   Pulse 73   Temp 98.5 F (36.9 C) (Oral)   Resp 18   SpO2 100%   Physical Exam  Constitutional: She is oriented to person, place, and time. She appears well-developed and well-nourished. No distress.  HENT:  Head:  Normocephalic and atraumatic.  Eyes: Conjunctivae are normal. Pupils are equal, round, and reactive to light. Right eye exhibits no discharge. Left eye exhibits no discharge. No scleral icterus.  Neck: Normal range of motion.  Cardiovascular: Normal rate and regular rhythm.  Exam reveals no gallop and no friction rub.   No murmur heard. Pulmonary/Chest: Effort normal. Tachypnea (mildly) noted. No respiratory distress. She has decreased breath sounds in the left upper field, the left middle field and the left lower field. She has no wheezes. She has no rhonchi. She has no rales.  Abdominal: She exhibits no distension.  Genitourinary:  Genitourinary Comments: Rectal: Dark brown liquid stool noted on digital exam. No gross blood, hemorrhoids, fissures, redness, area of fluctuance, lesions, or tenderness. Chaperone present during exam.   Musculoskeletal:  1+ pitting edema bilaterally  Neurological: She is alert and oriented to person, place, and time.  Skin: Skin is  warm and dry. There is pallor.  Psychiatric: She has a normal mood and affect. Her speech is normal and behavior is normal.  Nursing note and vitals reviewed.    ED Treatments / Results  Labs (all labs ordered are listed, but only abnormal results are displayed) Labs Reviewed  BASIC METABOLIC PANEL - Abnormal; Notable for the following:       Result Value   Potassium 5.7 (*)    CO2 20 (*)    BUN 54 (*)    Creatinine, Ser 1.74 (*)    GFR calc non Af Amer 26 (*)    GFR calc Af Amer 30 (*)    All other components within normal limits  CBC WITH DIFFERENTIAL/PLATELET - Abnormal; Notable for the following:    RBC 2.32 (*)    Hemoglobin 6.2 (*)    HCT 20.8 (*)    MCHC 29.8 (*)    RDW 15.6 (*)    All other components within normal limits  BRAIN NATRIURETIC PEPTIDE - Abnormal; Notable for the following:    B Natriuretic Peptide 4,112.4 (*)    All other components within normal limits  COMPREHENSIVE METABOLIC PANEL - Abnormal;  Notable for the following:    Potassium 5.3 (*)    Glucose, Bld 114 (*)    BUN 52 (*)    Creatinine, Ser 1.76 (*)    Albumin 2.7 (*)    GFR calc non Af Amer 25 (*)    GFR calc Af Amer 29 (*)    All other components within normal limits  CBC WITH DIFFERENTIAL/PLATELET - Abnormal; Notable for the following:    RBC 3.39 (*)    Hemoglobin 9.3 (*)    HCT 30.1 (*)    RDW 15.9 (*)    All other components within normal limits  POC OCCULT BLOOD, ED - Abnormal; Notable for the following:    Fecal Occult Bld POSITIVE (*)    All other components within normal limits  MAGNESIUM  PHOSPHORUS  CBC WITH DIFFERENTIAL/PLATELET  CBC WITH DIFFERENTIAL/PLATELET  CBG MONITORING, ED  I-STAT TROPOININ, ED  TYPE AND SCREEN  PREPARE RBC (CROSSMATCH)    EKG  EKG Interpretation  Date/Time:  Thursday May 19 2017 14:37:16 EDT Ventricular Rate:  75 PR Interval:    QRS Duration: 99 QT Interval:  444 QTC Calculation: 496 R Axis:   -37 Text Interpretation:  Sinus rhythm Borderline prolonged PR interval Abnormal R-wave progression, late transition Left ventricular hypertrophy Borderline prolonged QT interval no significant change since Jan 2018 Confirmed by Sherwood Gambler [phone]) on 05/19/2017 2:58:44 PM       Radiology Dg Chest 2 View  Result Date: 05/19/2017 CLINICAL DATA:  Weakness and nausea for 1 month EXAM: CHEST  2 VIEW COMPARISON:  04/28/2017 FINDINGS: Cardiac shadow remains enlarged. The overall inspiratory effort is stable. Mild vascular congestion is seen without definitive interstitial edema. No focal infiltrate is seen. Degenerative changes of the thoracic spine are noted. IMPRESSION: Mild vascular congestion. Electronically Signed   By: Inez Catalina M.D.   On: 05/19/2017 15:53    Procedures Procedures (including critical care time)  CRITICAL CARE Performed by: Recardo Evangelist   Total critical care time: 35 minutes  Critical care time was exclusive of separately billable  procedures and treating other patients.  Critical care was necessary to treat or prevent imminent or life-threatening deterioration.  Critical care was time spent personally by me on the following activities: development of treatment plan with patient and/or surrogate  as well as nursing, discussions with consultants, evaluation of patient's response to treatment, examination of patient, obtaining history from patient or surrogate, ordering and performing treatments and interventions, ordering and review of laboratory studies, ordering and review of radiographic studies, pulse oximetry and re-evaluation of patient's condition.   Medications Ordered in ED Medications - No data to display   Initial Impression / Assessment and Plan / ED Course  I have reviewed the triage vital signs and the nursing notes.  Pertinent labs & imaging results that were available during my care of the patient were reviewed by me and considered in my medical decision making (see chart for details).  83 year old female with generalized weakness, anorexia, SOB. Vitals are normal. She appears pale and has decreased breath sounds on the left. EKG is NSR and is unchanged. CXR shows some worsening vascular congestion compared to a couple weeks ago. She initially is refusing blood draws. Explained to her that we cannot do a full evaluation if she does not want this done. She verbalized understanding but is still refusing. She has capacity at this point.  Nursing staff notified me that she is now agreeing to blood work. CBC showed hgb is 6.2. No leukocytosis. BMP remarkable for hyperkalemia of 5.7. She also has a mild AKI - SCr is 1.74 up from 1.45 on 5/3. CXR remarkable for increased congestion and BNP is up. Troponin is 0.06. Hemoccult was positive. Stool appears dark brown/black but she is on iron. Shared visit with Dr. Regenia Skeeter. Will admit to hospitalist   Final Clinical Impressions(s) / ED Diagnoses   Final diagnoses:    Anemia, unspecified type  Weakness    New Prescriptions New Prescriptions   No medications on file     Iris Pert 05/21/17 [place], MD 05/30/17 [phone]

## 2017-05-20 ENCOUNTER — Inpatient Hospital Stay (HOSPITAL_COMMUNITY): Payer: Medicare Other

## 2017-05-20 DIAGNOSIS — N839 Noninflammatory disorder of ovary, fallopian tube and broad ligament, unspecified: Secondary | ICD-10-CM | POA: Diagnosis not present

## 2017-05-20 DIAGNOSIS — I272 Pulmonary hypertension, unspecified: Secondary | ICD-10-CM | POA: Diagnosis not present

## 2017-05-20 DIAGNOSIS — D508 Other iron deficiency anemias: Secondary | ICD-10-CM

## 2017-05-20 DIAGNOSIS — R7989 Other specified abnormal findings of blood chemistry: Secondary | ICD-10-CM | POA: Diagnosis not present

## 2017-05-20 DIAGNOSIS — R531 Weakness: Secondary | ICD-10-CM

## 2017-05-20 DIAGNOSIS — E43 Unspecified severe protein-calorie malnutrition: Secondary | ICD-10-CM | POA: Insufficient documentation

## 2017-05-20 DIAGNOSIS — I130 Hypertensive heart and chronic kidney disease with heart failure and stage 1 through stage 4 chronic kidney disease, or unspecified chronic kidney disease: Secondary | ICD-10-CM | POA: Diagnosis not present

## 2017-05-20 DIAGNOSIS — F17210 Nicotine dependence, cigarettes, uncomplicated: Secondary | ICD-10-CM

## 2017-05-20 DIAGNOSIS — I5033 Acute on chronic diastolic (congestive) heart failure: Secondary | ICD-10-CM | POA: Diagnosis not present

## 2017-05-20 DIAGNOSIS — D62 Acute posthemorrhagic anemia: Secondary | ICD-10-CM | POA: Diagnosis not present

## 2017-05-20 DIAGNOSIS — I10 Essential (primary) hypertension: Secondary | ICD-10-CM

## 2017-05-20 DIAGNOSIS — E875 Hyperkalemia: Secondary | ICD-10-CM | POA: Diagnosis not present

## 2017-05-20 DIAGNOSIS — R195 Other fecal abnormalities: Secondary | ICD-10-CM | POA: Diagnosis not present

## 2017-05-20 DIAGNOSIS — D500 Iron deficiency anemia secondary to blood loss (chronic): Secondary | ICD-10-CM | POA: Diagnosis not present

## 2017-05-20 DIAGNOSIS — D649 Anemia, unspecified: Secondary | ICD-10-CM

## 2017-05-20 DIAGNOSIS — Z515 Encounter for palliative care: Secondary | ICD-10-CM | POA: Diagnosis not present

## 2017-05-20 DIAGNOSIS — Z7189 Other specified counseling: Secondary | ICD-10-CM | POA: Diagnosis not present

## 2017-05-20 DIAGNOSIS — Z6821 Body mass index (BMI) 21.0-21.9, adult: Secondary | ICD-10-CM | POA: Diagnosis not present

## 2017-05-20 DIAGNOSIS — I5043 Acute on chronic combined systolic (congestive) and diastolic (congestive) heart failure: Secondary | ICD-10-CM | POA: Diagnosis not present

## 2017-05-20 DIAGNOSIS — N179 Acute kidney failure, unspecified: Secondary | ICD-10-CM

## 2017-05-20 DIAGNOSIS — K922 Gastrointestinal hemorrhage, unspecified: Secondary | ICD-10-CM

## 2017-05-20 DIAGNOSIS — I5032 Chronic diastolic (congestive) heart failure: Secondary | ICD-10-CM

## 2017-05-20 DIAGNOSIS — N184 Chronic kidney disease, stage 4 (severe): Secondary | ICD-10-CM | POA: Diagnosis not present

## 2017-05-20 LAB — CBC WITH DIFFERENTIAL/PLATELET
Basophils Absolute: 0 10*3/uL (ref 0.0–0.1)
Basophils Relative: 0 %
EOS PCT: 1 %
Eosinophils Absolute: 0.1 10*3/uL (ref 0.0–0.7)
HEMATOCRIT: 30.1 % — AB (ref 36.0–46.0)
Hemoglobin: 9.3 g/dL — ABNORMAL LOW (ref 12.0–15.0)
LYMPHS PCT: 20 %
Lymphs Abs: 1.4 10*3/uL (ref 0.7–4.0)
MCH: 27.4 pg (ref 26.0–34.0)
MCHC: 30.9 g/dL (ref 30.0–36.0)
MCV: 88.8 fL (ref 78.0–100.0)
Monocytes Absolute: 0.8 10*3/uL (ref 0.1–1.0)
Monocytes Relative: 12 %
NEUTROS ABS: 4.7 10*3/uL (ref 1.7–7.7)
Neutrophils Relative %: 67 %
PLATELETS: 284 10*3/uL (ref 150–400)
RBC: 3.39 MIL/uL — AB (ref 3.87–5.11)
RDW: 15.9 % — ABNORMAL HIGH (ref 11.5–15.5)
WBC: 7 10*3/uL (ref 4.0–10.5)

## 2017-05-20 LAB — TYPE AND SCREEN
ABO/RH(D): O POS
Antibody Screen: NEGATIVE
UNIT DIVISION: 0
Unit division: 0

## 2017-05-20 LAB — BPAM RBC
BLOOD PRODUCT EXPIRATION DATE: 201806182359
BLOOD PRODUCT EXPIRATION DATE: 201806182359
ISSUE DATE / TIME: 201805242237
ISSUE DATE / TIME: 201805241811
UNIT TYPE AND RH: 5100
Unit Type and Rh: 5100

## 2017-05-20 LAB — COMPREHENSIVE METABOLIC PANEL
ALT: 17 U/L (ref 14–54)
ANION GAP: 5 (ref 5–15)
AST: 24 U/L (ref 15–41)
Albumin: 2.7 g/dL — ABNORMAL LOW (ref 3.5–5.0)
Alkaline Phosphatase: 91 U/L (ref 38–126)
BUN: 52 mg/dL — ABNORMAL HIGH (ref 6–20)
CHLORIDE: 109 mmol/L (ref 101–111)
CO2: 22 mmol/L (ref 22–32)
Calcium: 8.9 mg/dL (ref 8.9–10.3)
Creatinine, Ser: 1.76 mg/dL — ABNORMAL HIGH (ref 0.44–1.00)
GFR calc non Af Amer: 25 mL/min — ABNORMAL LOW (ref >60)
GFR, EST AFRICAN AMERICAN: 29 mL/min — AB (ref >60)
Glucose, Bld: 114 mg/dL — ABNORMAL HIGH (ref 65–99)
POTASSIUM: 5.3 mmol/L — AB (ref 3.5–5.1)
SODIUM: 136 mmol/L (ref 135–145)
Total Bilirubin: 0.8 mg/dL (ref 0.3–1.2)
Total Protein: 7.2 g/dL (ref 6.5–8.1)

## 2017-05-20 LAB — MAGNESIUM: Magnesium: 2.3 mg/dL (ref 1.7–2.4)

## 2017-05-20 LAB — PHOSPHORUS: PHOSPHORUS: 4.1 mg/dL (ref 2.5–4.6)

## 2017-05-20 MED ORDER — ACETAMINOPHEN 325 MG PO TABS
650.0000 mg | ORAL_TABLET | Freq: Once | ORAL | Status: AC
  Administered 2017-05-20: 650 mg via ORAL
  Filled 2017-05-20: qty 2

## 2017-05-20 MED ORDER — IOPAMIDOL (ISOVUE-300) INJECTION 61%
INTRAVENOUS | Status: AC
  Filled 2017-05-20: qty 30

## 2017-05-20 MED ORDER — ADULT MULTIVITAMIN W/MINERALS CH
1.0000 | ORAL_TABLET | Freq: Every day | ORAL | Status: DC
  Administered 2017-05-21: 1 via ORAL
  Filled 2017-05-20 (×2): qty 1

## 2017-05-20 MED ORDER — BOOST / RESOURCE BREEZE PO LIQD
1.0000 | Freq: Two times a day (BID) | ORAL | Status: DC
  Administered 2017-05-20: 1 via ORAL

## 2017-05-20 MED ORDER — IOPAMIDOL (ISOVUE-300) INJECTION 61%
30.0000 mL | Freq: Once | INTRAVENOUS | Status: AC
  Administered 2017-05-20: 30 mL via ORAL

## 2017-05-20 MED ORDER — ENSURE ENLIVE PO LIQD
237.0000 mL | ORAL | Status: DC
  Administered 2017-05-20: 237 mL via ORAL

## 2017-05-20 NOTE — Progress Notes (Signed)
CT Imaging brought oral contrast to pt to drink for CT scheduled at 1900. Pt told CT that she would "think about drinking it". As of 1830 pt still states that she "will drink it later". I attempted to notify CT, however phone was not answered. Will report this to next shift.

## 2017-05-20 NOTE — Progress Notes (Signed)
Initial Nutrition Assessment  DOCUMENTATION CODES:   Severe malnutrition in context of acute illness/injury  INTERVENTION:  - Will order Ensure Enlive once/day, this supplement provides 350 kcal and 20 grams of protein - Will order Boost Breeze BID, each supplement provides 250 kcal and 9 grams of protein - Will order daily multivitamin with minerals. - Continue to encourage PO intakes of meals, supplements, and beverages.  NUTRITION DIAGNOSIS:   Malnutrition related to acute illness, poor appetite (progressive weakness x1 month) as evidenced by severe depletion of body fat, severe depletion of muscle mass.  GOAL:   Patient will meet greater than or equal to 90% of their needs  MONITOR:   PO intake, Supplement acceptance, Weight trends, Labs  REASON FOR ASSESSMENT:   Malnutrition Screening Tool  ASSESSMENT:   83 y.o. female with medical history significant of aortic insufficiency, mitral regurgitation, chronic blood loss anemia, chronic diastolic CHF, hypertension, chronic kidney disease, active smoker who is coming to the emergency department with complaints of progressively worse weakness for about one month associated with chills, fatigue, malaise, shortness of breath, occasionally productive cough of yellowish/brownish sputum, intermittent nausea, decreased appetite, postural dizziness and lower extremity edema. She states that she feels "just miserable".   Pt seen for MST. BMI indicates normal weight. No intakes documented since admission. Pt states that she ate 100% of pancakes, bacon, and breakfast potatoes for breakfast and that she has not yet ordered lunch d/t eating breakfast late. PTA her appetite would "come and go" and would be associated with fluctuations in weight. She denies abdominal pain or nausea at this time. Pt not overly interested in discussion at time of visit so unable to obtain additional information at this time. No family/visitors present at this time.    Physical assessment to upper body only shows moderate and severe muscle and moderate and severe fat wasting. Review of weight trends listed in the chart in agreement with pt's report of weight fluctuating. Weight has been 114-123 lbs since 11/04/16. Most recently she went from 123 lbs on 5/3 to 118 lbs on admission indicating 5 lb weight loss (4% body weight) in 3 weeks which is significant for time frame.  Medications reviewed; 20 mg IV Lasix x1 dose yesterday. Labs reviewed; K: 5.7 mmol/L, BUN: 54 mg/dL, creatinine: 1.74 mg/dL, GFR: 30 mL/min.   Diet Order:  Diet Heart Room service appropriate? Yes; Fluid consistency: Thin  Skin:  Reviewed, no issues  Last BM:  5/24  Height:   Ht Readings from Last 1 Encounters:  05/19/17 5\' 2"  (1.575 m)    Weight:   Wt Readings from Last 1 Encounters:  05/20/17 118 lb 13.3 oz (53.9 kg)    Ideal Body Weight:  50 kg  BMI:  Body mass index is 21.73 kg/m.  Estimated Nutritional Needs:   Kcal:  1350-1510 (25-28 kcal/kg)  Protein:  55-65 grams  Fluid:  >/= 1.5 L/day  EDUCATION NEEDS:   No education needs identified at this time     Jarome Matin, MS, RD, LDN, CNSC Inpatient Clinical Dietitian Pager # [phone] After hours/weekend pager # [phone]

## 2017-05-20 NOTE — Progress Notes (Signed)
PROGRESS NOTE    Valerie Downs  FMB:[id number] DOB: 02/21/1930 DOA: 05/19/2017 PCP: Valerie Circle, FNP   Brief Narrative:  RELDA Downs is a 83 y.o. female with medical history significant of aortic insufficiency, mitral regurgitation, chronic blood loss anemia, chronic diastolic CHF, hypertension, chronic kidney disease, active smoker and other comorbidities who presented to the Emergency Department with complaints of progressively worse weakness for about one month associated with chills, fatigue, malaise, shortness of breath, occasionally productive cough of yellowish/brownish sputum, intermittent nausea, decreased appetite, postural dizziness and lower extremity edema. She states that she feels "just miserable". She denied fever, chest pain, diaphoresis, palpitations, abdominal pain, diarrhea, emesis, melena or hematochezia. She was worked up and admitted for Acute on Chronic Anemia with ? Upper GI Bleed. Patient has been refusing most of her care while hospitalized and will discuss with GI and Palliative Care Medicine about patient's case.   Assessment & Plan:   Principal Problem:   Anemia Active Problems:   HYPERTENSION, BENIGN SYSTEMIC   Heme positive stool   AKI (acute kidney injury) ([place])   Cigarette smoker   Elevated brain natriuretic peptide (BNP) level   Chronic diastolic CHF (congestive heart failure) (HCC)   UGI bleed   Hyperkalemia   Acute on chronic diastolic CHF (congestive heart failure) (HCC)   Protein-calorie malnutrition, severe  Chronic GI bleed/Anemia in the setting of ? Ovarian Cancer -Admitted to Telemetry/Inpatient. -Continue to Hold ASA -FOBT was Positive -S/p Transfusion of 2 units of pRBC's; Patient refusing labs and will not allow [place] to draw Labs to recheck Hb/Hbt and Kidney Function -Gastroenterology Dr. Donnella Downs for further management and evaluation -He does not feel like the patient has a colonic source for her Anemia given recent  colonoscopy but feels as if it is small bowel/upper tract if she has having bleeding from bowel -Discussed Valerie Downs with Valerie Downs with oral Contrast to evaluate small bowel to ensure no mass lesions or evidence of metastatic disease; Will orderd -Also discussed EGD and Capsule study but not sure if she is willing to pursue full work up  -[place] for Goals of Care -C/w IV Protonix 40 mg q12h for now  AKI on CKD Stage 3 from ABLA and CHF -Patient's Cr on 04/28/17 was 1.45 -BUN/Cr now 52/1.76 -Was given IV Lasix for elevated BNP and suspected Volume Overload -Repeat CMP in AM  Severe Protein Calorie Malnutrition -Nutritionist Consulted and appreciated Recc's -C/w Boost/Resource Breeze 1 container po BID between meals -C/w Ensure Enlive 237 mL po q24h  Hyperkalemia. -K+ was 5.7 on admission and improved to 5.3; Likely from AKI -Received IV furosemide pre-transfusion and beta agonists bronchodilators. -Repeat Blood work ordered this AM but patient refused blood draws all day until later this afternoon -Repeat CMP in AM    Acute on chronic diastolic CHF (congestive heart failure) (HCC) -Received Furosemide IV 20 mg earlier. -Patient on IV Metoprolol 2.5 mg IV q6hprn  -Strict I's and O's, Daily Weights, SLIV  Tobacco Abuse -Declined Nicotine Replacement Therapy.    Essential Hypertension -Hld Amlodipine, po Furosemide and Metoprolol on Admission. -Continue to Monitor blood pressure. -Repeat CMP in AM  ? Lung Cancer -Valerie Angio in January this year showed The patient's known malignancy in the medial right lower lobe posterior to the right lower lobe bronchus is larger in the interval. A pre tracheal node is more prominent in the interval. No other change within visualized lymph nodes.  -EBUS results were non-diagnositc and  patient declined further workup and hospice at that time -Dr. Melvyn Downs saw patient earlier this month and feels she has a low grade Lung  Cancer and Ovarian cancer  Ovarian Mass concern for Cancer -Sen on PET Scan -Patient has refused further workup  DVT prophylaxis: SCDs Code Status: FULL CODE Family Communication: No family present at bedside Disposition Plan: Pending further workup however patient keeps refusing  Consultants:   Gastroenterology Valerie Downs  Palliative Care Medicine Dr. Micheline Downs   Procedures: None   Antimicrobials:  Anti-infectives    None     Subjective: Seen and examined this AM and refused blood draws. States she is feeling better and wants to eat. No nausea or vomiting. She has been refusing care per nursing.   Objective: Vitals:   05/20/17 0516 05/20/17 0634 05/20/17 1046 05/20/17 1452  BP: (!) 152/76  (!) 162/71 131/64  Pulse: 81  81 82  Resp: 18  19 19   Temp: (!) 100.6 F (38.1 C) 97.5 F (36.4 C) 99.7 F (37.6 C) 97.8 F (36.6 C)  TempSrc: Oral Oral Oral Oral  SpO2: 100%  99% 100%  Weight:      Height:        Intake/Output Summary (Last 24 hours) at 05/20/17 1922 Last data filed at 05/20/17 1100  Gross per 24 hour  Intake             1172 ml  Output              700 ml  Net              472 ml   Filed Weights   05/19/17 1626 05/19/17 2019 05/20/17 0515  Weight: 58.1 kg (128 lb) 54.2 kg (119 lb 7.8 oz) 53.9 kg (118 lb 13.3 oz)   Examination: Physical Exam:  Constitutional: Thin frail cachectic AAF in NAD and appears calm and comfortable but not willing to allow [place] to take care of her and refusing lab work and testing offered Eyes: Lids are normal;conjunctivae has some pallor, sclerae anicteric  ENMT: External Ears, Nose appear normal. Grossly normal hearing.  Neck: Appears normal, supple, no cervical masses, normal ROM, no appreciable thyromegaly, no JVD Respiratory: Diminished to auscultation bilaterally, no wheezing, rales, rhonchi or crackles. Normal respiratory effort and patient is not tachypenic. No accessory muscle use.  Cardiovascular: RRR,  Loud 3/6 Systolic Murmur. S1 and S2 auscultated. Mild pedal edema.  Abdomen: Soft, non-tender, non-distended. No masses palpated. No appreciable hepatosplenomegaly. Bowel sounds positive.  GU: Deferred. Musculoskeletal: No clubbing / cyanosis of digits/nails. No joint deformity upper and lower extremities.  Skin: No rashes, lesions, ulcers on limited skin evaluation. No induration; Warm and dry.  Neurologic: CN 2-12 grossly intact with no focal deficits. Romberg sign cerebellar reflexes not assessed.  Psychiatric: Normal judgment and insight. Alert and oriented x 3. Unpleasant mood and flat affect.   Data Reviewed: I have personally reviewed following labs and imaging studies  CBC:  Recent Labs Lab 05/19/17 1621  WBC 6.2  NEUTROABS 3.4  HGB 6.2*  HCT 20.8*  MCV 89.7  PLT 102   Basic Metabolic Panel:  Recent Labs Lab 05/19/17 1621 05/20/17 1604  NA 137 136  K 5.7* 5.3*  CL 108 109  CO2 20* 22  GLUCOSE 87 114*  BUN 54* 52*  CREATININE 1.74* 1.76*  CALCIUM 8.9 8.9  MG  --  2.3  PHOS  --  4.1   GFR: Estimated Creatinine Clearance: 18.8 mL/min (A) (  by C-G formula based on SCr of 1.76 mg/dL (H)). Liver Function Tests:  Recent Labs Lab 05/20/17 1604  AST 24  ALT 17  ALKPHOS 91  BILITOT 0.8  PROT 7.2  ALBUMIN 2.7*   No results for input(s): LIPASE, AMYLASE in the last 168 hours. No results for input(s): AMMONIA in the last 168 hours. Coagulation Profile: No results for input(s): INR, PROTIME in the last 168 hours. Cardiac Enzymes: No results for input(s): CKTOTAL, CKMB, CKMBINDEX, TROPONINI in the last 168 hours. BNP (last 3 results)  Recent Labs  04/01/17 1142 04/28/17 1054  PROBNP 31,525* 3,325.0*   HbA1C: No results for input(s): HGBA1C in the last 72 hours. CBG:  Recent Labs Lab 05/19/17 1441  GLUCAP 81   Lipid Profile: No results for input(s): CHOL, HDL, LDLCALC, TRIG, CHOLHDL, LDLDIRECT in the last 72 hours. Thyroid Function Tests: No  results for input(s): TSH, T4TOTAL, FREET4, T3FREE, THYROIDAB in the last 72 hours. Anemia Panel: No results for input(s): VITAMINB12, FOLATE, FERRITIN, TIBC, IRON, RETICCTPCT in the last 72 hours. Sepsis Labs: No results for input(s): PROCALCITON, LATICACIDVEN in the last 168 hours.  No results found for this or any previous visit (from the past 240 hour(s)).   Radiology Studies: Dg Chest 2 View  Result Date: 05/19/2017 CLINICAL DATA:  Weakness and nausea for 1 month EXAM: CHEST  2 VIEW COMPARISON:  04/28/2017 FINDINGS: Cardiac shadow remains enlarged. The overall inspiratory effort is stable. Mild vascular congestion is seen without definitive interstitial edema. No focal infiltrate is seen. Degenerative changes of the thoracic spine are noted. IMPRESSION: Mild vascular congestion. Electronically Signed   By: Inez Catalina M.D.   On: 05/19/2017 15:53   Scheduled Meds: . feeding supplement  1 Container Oral BID BM  . feeding supplement (ENSURE ENLIVE)  237 mL Oral Q24H  . iopamidol  30 mL Oral Once  . iopamidol      . multivitamin with minerals  1 tablet Oral Daily  . pantoprazole (PROTONIX) IV  40 mg Intravenous Q12H   Continuous Infusions:   LOS: 1 day   Kerney Elbe, DO [facility] Pager [phone]  If 7PM-7AM, please contact night-coverage [email or web address] Password [email or web address] 05/20/2017, 7:22 PM

## 2017-05-20 NOTE — Consult Note (Signed)
[facility] Consult: 8:43 AM 05/20/2017  LOS: 1 day    Referring Provider: Dr Alfredia Ferguson  Primary Care Physician:  Golden Circle, FNP Primary Gastroenterologist:  Dr. Hilarie Fredrickson.  Only seen in 05/2014.     Reason for Consultation:  GIB, anemia.     HPI: Valerie Downs is a 83 y.o. female.  PMH breast cancer and mastectomy 1984.  Chronic dyspnea.  Severe pulmonary htn.   Diastolic/systolic heart failure. Severe tricuspid regurge and aortic insufficiency, EF 40 to 50%.  Pericardial effusion. Declined cardiac cath 06/2016.  HTN.  CKD stage 4.  Right LL lesion, PET 04/6388: hypermetabollic RLL lesion c/w neoplasm, PET + paratracheal nodes, PET + left ovarian mass.  S/P 12/2016 bronch with FNA of "atypical cells" and "benign reactive/reparative lymphoid tissue".  Dr Melvyn Novas "suspects low grade lung ca and ovarian ca and these are completely unrelated to the previous h/o breast ca though certainly can't be sure of this.  CHF appears to be her immediate life limiting problem". Chronic anemia, on oral iron  Pt and family agree they want no further investigation on lung findings, pt continues to smoke 1PPD   EGD 05/2014 for symptomatic acute on chronic anemia requiring transfusion, FOBT + stool.  Showed mild nodular gastritis (inflammation) in the gastric body and antrum; biopsies for H. Pylori was positive and pt treated with course of Pylera/PPI.  Few small, benign, sessile polyps were found in the cardia and gastric body.  Esophagus and duodenum normal.   Colonoscopy  05/2014.  Normal study, good prep.   Plan: Iron replacement therapy.. Monitor Hgb/HCT and iron stores.  If not improving with iron replacement, then next test would be video capsule endoscopy.    Presented and admitted with worsening fatigue, weakness but no worsening of  chronic SOB.  Anorexia is chronic.  Nausea and heartburn intermittently, no emesis.  No abd pain. No on PPI etc at home.  Only ASA/NSAID is 325 mg ASA.  Takes her po iron 1 x daily  No ETOH.  No dysphagia.  Weak urinary stream, decreased urine output but no hematuria.  No unusual bleeding or bruising.  Weight 01/17/17: 123#, currently at 118 #.      Stool FOBT +.  However she denies melenic, bloody stool.   Hgb trend: 01/05/17 >> 03/2017 >> 04/28/17 Hgb trended: 9.6 (01/05/17) >> 8.3 (01/17/17) >> 8.8 (04/01/17) >> 7.7 (04/28/17) >> 6.2 (05/19/17).  MCV ~ 90.   Normal coags.   Her BNP is markedly elevated at 4112.  Cardiac enzymes negative. Worsening of CKD with Potassium is 5.7.        Past Medical History:  Diagnosis Date  . Aortic insufficiency   . Blood transfusion without reported diagnosis   . Breast cancer ([place])   . Cardiomyopathy ([place])   . CHF (congestive heart failure) ([place])   . History of echocardiogram    Echo 4/18: severe focal basal and mod conc LVH, EF 50-55, no RWMA, Gr 2 DD, mod AI, severe MR, mild LAE, mildly reduced RVSF, severe TR, mod PI,  PASP 65, mild pericardial eff  . Hypertension   . Mitral regurgitation     Past Surgical History:  Procedure Laterality Date  . COLONOSCOPY N/A 06/22/2014   Procedure: COLONOSCOPY;  Surgeon: Jerene Bears, MD;  Location: [facility];  Service: Endoscopy;  Laterality: N/A;  . ENDOBRONCHIAL ULTRASOUND N/A 12/31/2016   Procedure: ENDOBRONCHIAL ULTRASOUND;  Surgeon: Juanito Doom, MD;  Location: [facility];  Service: Cardiopulmonary;  Laterality: N/A;  . ESOPHAGOGASTRODUODENOSCOPY N/A 06/22/2014   Procedure: ESOPHAGOGASTRODUODENOSCOPY (EGD);  Surgeon: Jerene Bears, MD;  Location: [facility];  Service: Endoscopy;  Laterality: N/A;  . MASTECTOMY Left     Prior to Admission medications   Medication Sig Start Date End Date Taking? Authorizing Provider  amLODipine (NORVASC) 2.5 MG tablet Take 1 tablet (2.5 mg total) by mouth daily. 04/01/17  06/30/17 Yes Weaver, Scott T, PA-C  aspirin EC 325 MG tablet Take 325 mg by mouth daily.   Yes [provider]  ferrous gluconate (IRON 27) 240 (27 FE) MG tablet Take 1 tablet (240 mg total) by mouth 2 (two) times daily. Taking this only once per day. 01/06/17  Yes Short, Noah Delaine, MD  furosemide (LASIX) 40 MG tablet Take 1 tablet (40 mg total) by mouth daily. 04/03/17  Yes Weaver, Scott T, PA-C  metoprolol tartrate (LOPRESSOR) 25 MG tablet Take 1 tablet (25 mg total) by mouth 2 (two) times daily. 06/09/16  Yes Burtis Junes, NP  Multiple Vitamins-Minerals (CENTRUM SILVER 50+WOMEN) TABS Take 1 tablet by mouth daily.   Yes [provider]    Scheduled Meds: . pantoprazole (PROTONIX) IV  40 mg Intravenous Q12H   Infusions:  PRN Meds: ipratropium-albuterol, metoprolol tartrate, ondansetron **OR** ondansetron (ZOFRAN) IV   Allergies as of 05/19/2017 - Review Complete 05/19/2017  Allergen Reaction Noted  . Penicillins Anaphylaxis and Swelling 05/08/2011    Family History  Problem Relation Age of Onset  . Hypertension Mother   . Cancer Father     Social History   Social History  . Marital status: Widowed    Spouse name: N/A  . Number of children: 10  . Years of education: 5   Occupational History  . Retired    Social History Main Topics  . Smoking status: Current Some Day Smoker    Packs/day: 0.25    Years: 50.00    Types: Cigarettes  . Smokeless tobacco: Former Systems developer    Types: Snuff     Comment: per patient has not used snuff in a while (10/18/16)  . Alcohol use No  . Drug use: No  . Sexual activity: No   Other Topics Concern  . Not on file   Social History Narrative   Denies abuse and feels safe at home.     REVIEW OF SYSTEMS: Constitutional:  Per HPI ENT:  No nose bleeds Pulm:  Per HPI.  Very little in way of cough or sputum. CV:  Nochest pain, palpitations.  bil left >> right LE edema GU:  No hematuria, no frequency GI:  Per HPI Heme:   Per HPI   Transfusions:  Only recorded transfusion is 12/2016: 1 PRBC.  Did get PRBCs in 2015 as well  Neuro:  No headaches, no peripheral tingling or numbness Derm:  No itching, no rash or sores.  Endocrine:  Normal TSH on 04/28/17.  No sweats or chills.  No polyuria or dysuria Immunization:  Reviewed.  Td in 1997.  Pt does not take flu shot, declines pnemovax.   Travel:  None beyond local counties in last few months.    PHYSICAL EXAM: Vital signs in last 24 hours: Vitals:   05/20/17 0516 05/20/17 0634  BP: (!) 152/76   Pulse: 81   Resp: 18   Temp: (!) 100.6 F (38.1 C) 97.5 F (36.4 C)   Wt Readings from Last 3 Encounters:  05/20/17 53.9 kg (118 lb 13.3 oz)  04/28/17 55.9 kg (123 lb 3.2 oz)  04/20/17 55 kg (121 lb 3.2 oz)    General: cachectic, ill and worn out looking AAF.  She is comfortable and alert. Head:  No asymmetry, no swelling, no trauma  Eyes:  + conj pallor, no icterus.  Eyes bulge a bit. Ears:  Not HOH  Nose:  No congestion or discharge.   Mouth:  Clear, moist.  Tongue midline.   Neck:  No mass, no JVD.  No TMG Lungs:  Diminished with some bibasilar rales and ronchi.  Slightly labored breathing.  No cough.  S/p left mastectomy.   Heart: RRR with 1/6 diastolic murmer Abdomen:  Soft, NT, ND.  No mass, hernias, bruits, HSM.  Active BS.   Rectal: deferred.  DRE in ED: dark brown, liquid stool without gross blood, tested FOBT +. No masses or tenderness Musc/Skeltl: no joint swelling.  + kyphosis. Extremities:  Slight pedal/ankle edema  Neurologic:  Oriented x 3.  Moves all 4 limbs, strength not tested.  No tremor.   Skin:  No rashes, sores or suspicious lesions Tattoos:  none Nodes:  No cervical adenopathy   Psych:  Affect flat but cooperative and calm.    Intake/Output from previous day: 05/24 0701 - 05/25 0700 In: 1257 [P.O.:150; Blood:1107] Out: 550 [Urine:550] Intake/Output this shift: No intake/output data recorded.  LAB RESULTS:  Recent Labs   05/19/17 1621  WBC 6.2  HGB 6.2*  HCT 20.8*  PLT 305   BMET Lab Results  Component Value Date   NA 137 05/19/2017   NA 137 04/28/2017   NA 138 04/07/2017   K 5.7 (H) 05/19/2017   K 4.8 04/28/2017   K 5.5 (H) 04/07/2017   CL 108 05/19/2017   CL 107 04/28/2017   CL 102 04/07/2017   CO2 20 (L) 05/19/2017   CO2 24 04/28/2017   CO2 23 04/07/2017   GLUCOSE 87 05/19/2017   GLUCOSE 122 (H) 04/28/2017   GLUCOSE 81 04/07/2017   BUN 54 (H) 05/19/2017   BUN 30 (H) 04/28/2017   BUN 34 (H) 04/07/2017   CREATININE 1.74 (H) 05/19/2017   CREATININE 1.45 (H) 04/28/2017   CREATININE 1.39 (H) 04/07/2017   CALCIUM 8.9 05/19/2017   CALCIUM 9.1 04/28/2017   CALCIUM 8.5 (L) 04/07/2017   LFT No results for input(s): PROT, ALBUMIN, AST, ALT, ALKPHOS, BILITOT, BILIDIR, IBILI in the last 72 hours. PT/INR Lab Results  Component Value Date   INR 1.07 08/15/2014   INR 1.13 07/26/2013   INR 1.07 02/16/2013   Hepatitis Panel No results for input(s): HEPBSAG, HCVAB, HEPAIGM, HEPBIGM in the last 72 hours. C-Diff No components found for: CDIFF Lipase  No results found for: LIPASE  Drugs of Abuse  No results found for: LABOPIA, COCAINSCRNUR, LABBENZ, AMPHETMU, THCU, LABBARB   RADIOLOGY STUDIES: Dg Chest 2 View  Result Date: 05/19/2017 CLINICAL DATA:  Weakness and nausea for 1 month EXAM: CHEST  2 VIEW COMPARISON:  04/28/2017 FINDINGS: Cardiac shadow remains enlarged. The overall inspiratory effort is stable. Mild vascular congestion is seen without definitive interstitial edema. No focal infiltrate is  seen. Degenerative changes of the thoracic spine are noted. IMPRESSION: Mild vascular congestion. Electronically Signed   By: Inez Catalina M.D.   On: 05/19/2017 15:53      IMPRESSION:   *  Acute on chronic, normocytic anemia, despite 1 x daily iron.  Symptomatic with fatigue but no worse than usual SOB.  S/p PRBC x 2.     FOBT + with no overt bleeding. After EGD revealing H Pylori gastritis  and normal colonoscopy in 05/2014, rec was for capsule study.  No PPI currently.   *  Malignancy, ? Lung vs ovarian primary (Dr Melvyn Novas feels it is propably not recurrent breast CA) .  Pt has not wanted further investigation  *   Anorexia, intermittent nausea, Low albumin. Suspect protein calorie malnutrition.    *  Stage 4 CKD.  Hyperkalemia.  Advanced kidney dz is likely contributing to her anemia?   *  Diastolic/systolic CHF.  Severe tricuspid regurge.  No worse than usual SOB. Despite the markedly elevated BNP, she is not terribly symptomatic.    PLAN:     *  At present pt not sure about pursuing capsule endoscopy or repeat EGD etc.  So will let her eat.    *  Wonder about heme eval and whether EPO would be of benefit?    *  Adding daily PPI for hx H Pylori gastritis and for her c/o intermittent nausea  *  Given multiple serious medical issues including advanced CKD,  CHF, severe pulm htn, malignancy in lung/ovary, and pt's long hx of reluctance to pursue invasive (and sometimes minor) studies, Palliative care goals of care consult ought to be sought.  I briefly discussed this with Mrs Denman who agreed that she would not want resuscitative measures.  Currently she is Full Code.   *  ? Effectiveness of iron/anemia studies after the transfusions, so did not order.       Azucena Freed  05/20/2017, 8:43 AM Pager: [phone]

## 2017-05-20 NOTE — Progress Notes (Signed)
Further education provided about the need for blood draw/labs to assist with plan of care needs.  Pt continues to refuse stating "I just don't want it".

## 2017-05-20 NOTE — Progress Notes (Signed)
PT Cancellation Note  Patient Details Name: Valerie Downs MRN: [id number] DOB: 1930-07-26   Cancelled Treatment:    Reason Eval/Treat Not Completed: Medical issues which prohibited therapy. Hgb 6.2 Will hold PT for now. Will check back.   Weston Anna, MPT Pager: [phone]

## 2017-05-20 NOTE — Progress Notes (Signed)
Patient is refusing lab blood draws despite encouragement from nurses and lab techs.

## 2017-05-20 NOTE — Care Management Note (Signed)
Case Management Note  Patient Details  Name: Valerie Downs MRN: [id number] Date of Birth: [birth year]  Subjective/Objective: 83 y/o f admitted w/UGIB. From home.PT cons-await recc.                   Action/Plan:d/c plan home.   Expected Discharge Date:   (unknown)               Expected Discharge Plan:  Home/Self Care  In-House Referral:     Discharge planning Services  CM Consult  Post Acute Care Choice:    Choice offered to:     DME Arranged:    DME Agency:     HH Arranged:    HH Agency:     Status of Service:  In process, will continue to follow  If discussed at Long Length of Stay Meetings, dates discussed:    Additional Comments:  Dessa Phi, RN 05/20/2017, 12:45 PM

## 2017-05-21 DIAGNOSIS — E43 Unspecified severe protein-calorie malnutrition: Secondary | ICD-10-CM | POA: Diagnosis not present

## 2017-05-21 DIAGNOSIS — N184 Chronic kidney disease, stage 4 (severe): Secondary | ICD-10-CM | POA: Diagnosis not present

## 2017-05-21 DIAGNOSIS — I5033 Acute on chronic diastolic (congestive) heart failure: Secondary | ICD-10-CM | POA: Diagnosis not present

## 2017-05-21 DIAGNOSIS — D500 Iron deficiency anemia secondary to blood loss (chronic): Secondary | ICD-10-CM | POA: Diagnosis not present

## 2017-05-21 DIAGNOSIS — D649 Anemia, unspecified: Secondary | ICD-10-CM | POA: Diagnosis not present

## 2017-05-21 DIAGNOSIS — Z515 Encounter for palliative care: Secondary | ICD-10-CM | POA: Diagnosis not present

## 2017-05-21 DIAGNOSIS — I5032 Chronic diastolic (congestive) heart failure: Secondary | ICD-10-CM | POA: Diagnosis not present

## 2017-05-21 DIAGNOSIS — I272 Pulmonary hypertension, unspecified: Secondary | ICD-10-CM | POA: Diagnosis not present

## 2017-05-21 DIAGNOSIS — I5043 Acute on chronic combined systolic (congestive) and diastolic (congestive) heart failure: Secondary | ICD-10-CM | POA: Diagnosis not present

## 2017-05-21 DIAGNOSIS — D508 Other iron deficiency anemias: Secondary | ICD-10-CM | POA: Diagnosis not present

## 2017-05-21 DIAGNOSIS — I130 Hypertensive heart and chronic kidney disease with heart failure and stage 1 through stage 4 chronic kidney disease, or unspecified chronic kidney disease: Secondary | ICD-10-CM | POA: Diagnosis not present

## 2017-05-21 DIAGNOSIS — N179 Acute kidney failure, unspecified: Secondary | ICD-10-CM | POA: Diagnosis not present

## 2017-05-21 DIAGNOSIS — Z6821 Body mass index (BMI) 21.0-21.9, adult: Secondary | ICD-10-CM | POA: Diagnosis not present

## 2017-05-21 DIAGNOSIS — E875 Hyperkalemia: Secondary | ICD-10-CM | POA: Diagnosis not present

## 2017-05-21 DIAGNOSIS — R531 Weakness: Secondary | ICD-10-CM | POA: Diagnosis not present

## 2017-05-21 DIAGNOSIS — N839 Noninflammatory disorder of ovary, fallopian tube and broad ligament, unspecified: Secondary | ICD-10-CM | POA: Diagnosis not present

## 2017-05-21 DIAGNOSIS — Z7189 Other specified counseling: Secondary | ICD-10-CM | POA: Diagnosis not present

## 2017-05-21 DIAGNOSIS — D62 Acute posthemorrhagic anemia: Secondary | ICD-10-CM | POA: Diagnosis not present

## 2017-05-21 MED ORDER — BOOST / RESOURCE BREEZE PO LIQD: 1.0000 | Freq: Two times a day (BID) | ORAL | 0 refills | Status: AC

## 2017-05-21 MED ORDER — ENSURE ENLIVE PO LIQD: 237.0000 mL | ORAL | 12 refills | Status: DC

## 2017-05-21 MED ORDER — PANTOPRAZOLE SODIUM 40 MG PO TBEC: 40.0000 mg | DELAYED_RELEASE_TABLET | Freq: Two times a day (BID) | ORAL | 0 refills | Status: DC

## 2017-05-21 NOTE — Evaluation (Signed)
Physical Therapy Evaluation Patient Details Name: Valerie Downs MRN: [id number] DOB: 21-Aug-1930 Today's Date: 05/21/2017   History of Present Illness  83 yo female admitted with anemia. Hx of breast cancer, chronic anemia. CHF, aortic insufficiency, CKD, suspected lung and ovarian cancer    Clinical Impression  On eval, pt was Min guard assist for mobility. She walked ~115 feet with a RW. Pt presents with generalized weakness, decreased activity tolerance, and impaired gait and balance. Discussed d/c plan-pt stated she plans to return home. She is agreeable to HHPT f/u.     Follow Up Recommendations Home health PT;Supervision/Assistance - 24 hour    Equipment Recommendations  None recommended by PT    Recommendations for Other Services       Precautions / Restrictions Precautions Precautions: Fall Restrictions Weight Bearing Restrictions: No      Mobility  Bed Mobility Overal bed mobility: Needs Assistance Bed Mobility: Supine to Sit     Supine to sit: Supervision     General bed mobility comments: for safety  Transfers Overall transfer level: Needs assistance Equipment used: Rolling walker (2 wheeled) Transfers: Sit to/from Stand Sit to Stand: Supervision         General transfer comment: for safety  Ambulation/Gait Ambulation/Gait assistance: Min guard Ambulation Distance (Feet): 115 Feet Assistive device: Rolling walker (2 wheeled) Gait Pattern/deviations: Step-through pattern;Decreased stride length     General Gait Details: slow gait speed. close guard for safety.  Stairs            Wheelchair Mobility    Modified Rankin (Stroke Patients Only)       Balance Overall balance assessment: Needs assistance         Standing balance support: Bilateral upper extremity supported   Standing balance comment: required RW on today                             Pertinent Vitals/Pain Pain Assessment: Faces Faces Pain Scale: Hurts a  little bit Pain Location: generalized "all over" Pain Descriptors / Indicators: Sore Pain Intervention(s): Monitored during session    Home Living Family/patient expects to be discharged to:: Private residence Living Arrangements: Children (son (recently had surgery per pt)) Available Help at Discharge: Family;Available PRN/intermittently Type of Home: House Home Access: Level entry     Home Layout: One level Home Equipment: Walker - 2 wheels;Walker - 4 wheels;Cane - single point      Prior Function Level of Independence: Independent with assistive device(s)         Comments: ambulatory with cane     Hand Dominance        Extremity/Trunk Assessment   Upper Extremity Assessment Upper Extremity Assessment: Generalized weakness    Lower Extremity Assessment Lower Extremity Assessment: Generalized weakness    Cervical / Trunk Assessment Cervical / Trunk Assessment: Kyphotic  Communication   Communication: No difficulties  Cognition Arousal/Alertness: Awake/alert Behavior During Therapy: WFL for tasks assessed/performed Overall Cognitive Status: Within Functional Limits for tasks assessed                                        General Comments      Exercises     Assessment/Plan    PT Assessment Patient needs continued PT services  PT Problem List Decreased strength;Decreased mobility;Decreased balance;Decreased activity tolerance;Decreased knowledge of use of DME;Pain  PT Treatment Interventions DME instruction;Gait training;Therapeutic activities;Therapeutic exercise;Patient/family education;Balance training;Functional mobility training    PT Goals (Current goals can be found in the Care Plan section)  Acute Rehab PT Goals Patient Stated Goal: none stated PT Goal Formulation: With patient Time For Goal Achievement: 06/04/17 Potential to Achieve Goals: Good    Frequency Min 3X/week   Barriers to discharge         Co-evaluation               AM-PAC PT "6 Clicks" Daily Activity  Outcome Measure Difficulty turning over in bed (including adjusting bedclothes, sheets and blankets)?: A Little Difficulty moving from lying on back to sitting on the side of the bed? : A Little Difficulty sitting down on and standing up from a chair with arms (e.g., wheelchair, bedside commode, etc,.)?: A Little Help needed moving to and from a bed to chair (including a wheelchair)?: A Little Help needed walking in hospital room?: A Little Help needed climbing 3-5 steps with a railing? : A Little 6 Click Score: 18    End of Session Equipment Utilized During Treatment: Gait belt Activity Tolerance: Patient limited by fatigue Patient left: in chair;with call bell/phone within reach;with chair alarm set   PT Visit Diagnosis: Muscle weakness (generalized) (M62.81);Difficulty in walking, not elsewhere classified (R26.2)    Time: 2505-3976 PT Time Calculation (min) (ACUTE ONLY): 15 min   Charges:   PT Evaluation $PT Eval Low Complexity: 1 Procedure     PT G Codes:          Weston Anna, MPT Pager: [phone]

## 2017-05-21 NOTE — Progress Notes (Signed)
Discharge instructions reviewed with patient and her family. questions concerns denied. No change from am assessment. Pt a&ox4, ambulatory.

## 2017-05-21 NOTE — Progress Notes (Signed)
Patient remains a&ox4, ambulatory with assist. Pt continues to refuse care. Family has arrived, discharge plan discussed. Family asked primary RN if medical staff could make pt agreeable to plan of care. Family made aware that pt remains a&ox4 and is able to make own medical decisions. Will proceed with discharge.

## 2017-05-21 NOTE — Progress Notes (Signed)
Patient is again refusing blood work.

## 2017-05-21 NOTE — Progress Notes (Signed)
Patient requests all rails be up on bed.

## 2017-05-21 NOTE — Progress Notes (Signed)
Palliative Care Progress note  Reason for visit: Goals of care in light of chronic GI bleed, likely ovarian cancer/lung cancer  I met again with Valerie Downs.  She was lying in chair at bedside and reports that she is feeling, "better."  Stated that she wants to go home.  I attempted to engage her again in conversation regarding her long term goals.  When asked if she felt that coming to the hospital was beneficial to her she stated, "no."  She stated she is not going to agree to have any further workup or testing this hospitalization.  I attempted to discuss with her that if her goal is to remain in her home, avoid further testing or medical workup, be comfortable in her home, and avoid future hospitalizations, she may benefit from support of hospice at home. She stated that she is not wanting to come back to the hospital, however, she is not interested in home hospice either.  She would not elaborate her reasons for this to me.   I attempted to discuss with her her thoughts regarding her chronic GI bleed as well as likely malignancies.  She stated she is "not interested. Talked to doctors enough already."  I also attempted to clarify her CODE STATUS based upon the fact that she had indicated to other providers that she would not want aggressive measures/attempts at resuscitation in the event of natural death.  She stated she is, "not [place] talk about that."  I asked if there was any way that Valerie Downs felt that I can be of service to her this hospitalization.  She stated no. I also offered to call and speak with her family her set up a time for [place] to meet as a group to discuss her care moving forward. She declined and specifically said not to call her family.  She has my contact information and I left a copy of [facility] for [place] for her to take home and review.  She will call if she would like to meet again.  Please let me know if we can be of further assistance in the care of Ms.  Downs.  Total Time: 30 minutes Greater than 50%  of this time was spent counseling and coordinating care related to the above assessment and plan.  Micheline Rough, MD [place] [facility] [phone]

## 2017-05-21 NOTE — Progress Notes (Signed)
Patient refused lab work this am. Education provided on the importance. Pt stated "I've listened to doctors and nurses enough".

## 2017-05-21 NOTE — Discharge Summary (Signed)
Physician Discharge Summary  Valerie Downs IPJ:[id number] DOB: 1930-09-16 DOA: 05/19/2017  PCP: [facility], FNP  Admit date: 05/19/2017 Discharge date: 05/21/2017  Admitted From: Home Disposition:  [place] PT/RN  Recommendations for Outpatient Follow-up:  1. Follow up with PCP in 1-2 weeks 2. Follow up with Gastroenterology as an Outpatient 3. Follow up with Cardiology as an Outpatient 4. Follow up with Pulmonology as an outpatient  5. Please obtain CMP/CBC, Mag, Phos in one week  Home Health: YES Equipment/Devices: Non recommended by PT  Discharge Condition: Guarded  CODE STATUS: FULL CODE Diet recommendation: Heart Healthy   Brief/Interim Summary: Valerie Downs a 83 y.o.femalewith medical history significant of aortic insufficiency, mitral regurgitation, chronic blood loss anemia, chronic diastolic CHF, hypertension, chronic kidney disease, active smoker and other comorbidities who presented to the Emergency Department with complaints of progressively worse weakness for about one month associated with chills, fatigue, malaise, shortness of breath, occasionally productive cough of yellowish/brownish sputum, intermittent nausea, decreased appetite, postural dizziness and lower extremity edema. She states that she felt "just miserable". She denied fever, chest pain, diaphoresis, palpitations, abdominal pain, diarrhea, emesis, melena or hematochezia. She was worked up and admitted for Acute on Chronic Anemia with ? Upper GI Bleed.   Patient has been refusing most of her care while hospitalized and several attempts have been made by [facility] Staff including GI, Palliative Care, Nursing and myself to attempt to care for the patient. She adamantly has refused labwork and states "it was a mistake to come in" and that she just wants to "go home." She finally agreed to the CT of Abdomen and Pelvis late last night but refuses any more testing. Palliative Care attempted to discuss Goals  of Care with the patient she stated she was not interested and refused to talk Dr. Domingo Cocking. Patient is alert and oriented and understand her situation and is able to make her own medical decisions. She will be discharged because she is refusing all medical care and it was discussed with her the benefit of being treated but patient declined and states she is better and wants to go home and if anymore testing, or medication is administered she will refuse. Discussed plan with patient's daughter who understood and patient is going to be sent home with Home PT/RN as daughter agreed to that. Patient will need to follow up with PCP, Cardiology, Pulmonology, and Gastroenterology. Her PCP will need to broach the subject of possible Hospice as an outpatient as her Ovarian mass is getting larger and because she has several comorbidities. Patient is a high risk for Re-admission to the hospital.   Discharge Diagnoses:  Principal Problem:   Anemia Active Problems:   HYPERTENSION, BENIGN SYSTEMIC   Heme positive stool   AKI (acute kidney injury) ([place])   Cigarette smoker   Elevated brain natriuretic peptide (BNP) level   Chronic diastolic CHF (congestive heart failure) (HCC)   UGI bleed   Hyperkalemia   Acute on chronic diastolic CHF (congestive heart failure) (HCC)   Protein-calorie malnutrition, severe  Chronic GI bleed/Anemia in the setting of ? Ovarian Cancer -Admitted to Telemetry/Inpatient. -Continue to Hold ASA -FOBT was Positive -S/p Transfusion of 2 units of pRBC's; Patient refusing labs and did not allow [place] to draw Labs to recheck Hb/Hbt and Kidney Function this AM. Hb/Hct improved to 9.3/30.1 yesterday -Gastroenterology Dr. Donnella Sham for further management and evaluation -He does not feel like the patient has a colonic source for her Anemia given recent  colonoscopy but feels as if it is small bowel/upper tract if she has having bleeding from bowel -Discussed Imagaing with CT Abd/Pelvis  with oral Contrast to evaluate small bowel to ensure no mass lesions or evidence of metastatic disease; Ordered and patient finally agreed to have CT Abdomen/Pelvis last night which showed that her 6.1 cm left adnexal mass in size significantly; Patient refused for me to consult Surgery -Also discussed EGD and Capsule study but does not want any more testing to pursue full work up  -[place] for Goals of Care and patient refused to talk to Dr. Domingo Cocking and did not want to discuss hospice. Please see Dr. Kirstie Mirza note -Changed IV Protonix 40 mg q12h to po 40 mg BID -Continue to Hold ASA until seen by PCP and Gastroenterology as an outpatient   AKI on CKD Stage 3 from ABLA and CHF -Patient's Cr on 04/28/17 was 1.45 -BUN/Cr now 52/1.76; Refused Blood Work this AM -Was given IV Lasix for elevated BNP and suspected Volume Overload -Repeat CMP as an outpatient   Severe Protein Calorie Malnutrition -Nutritionist Consulted and appreciated Recc's -C/w Boost/Resource Breeze 1 container po BID between meals -C/w Ensure Enlive 237 mL po q24h  Hyperkalemia. -K+ was 5.7 on admission and improved to 5.3; Likely from AKI -Received IV furosemide pre-transfusion and beta agonists bronchodilators. Would not allow [place] to recheck Labs this AM -Repeat Blood work ordered this AM but patient refused blood draws  -Repeat CMP as an outpatient   Acute on chronic diastolic CHF (congestive heart failure) (HCC) -Received Furosemide IV 20 mg earlier during the [place]. -BNP was 4,112.4 on Admission -POC Troponin was 0.06 but patient refused to let us cycle Troponins  -Patient on IV Metoprolol 2.5 mg IV q6hprn; Resume home meds with Metoprolol -Strict I's and O's, Daily Weights, SLIV -CT Abd/Pelvis showed Cardiomegaly with trace ascites noted in the abdomen and pelvis and a small Right Pleural Effusion  -Follow up with Cardiology as an outpatient   Tobacco Abuse -Declined Nicotine  Replacement Therapy.  Essential Hypertension -Hld Amlodipine, po Furosemide and Metoprolol on Admission. -Continue to Monitor blood pressure. -Repeat CMP in AM  ? Lung Cancer -CT Angio in January this year showed The patient's known malignancy in the medial right lower lobe posterior to the right lower lobe bronchus is larger in the interval. A pre tracheal node is more prominent in the interval. No other change within visualized lymph nodes.  -EBUS results were non-diagnositc and patient declined further workup and hospice at that time -Dr. Melvyn Novas saw patient earlier this month and feels she has a low grade Lung Cancer and Ovarian cancer -Follow up with Pulmonology as an outpatient   Ovarian Mass likely Cancer -Sen on PET Scan -Patient has refused further workup -CT Abdomen/Pelvis done and showed that the Adnexal mass has significantly increased in size reflecting patient's known ovarian cancer and is now 6.1.  -Patient does not want Surgical Consultation  -Refusing further workup  Discharge Instructions  Discharge Instructions    Call MD for:  difficulty breathing, headache or visual disturbances    Complete by:  As directed    Call MD for:  extreme fatigue    Complete by:  As directed    Call MD for:  hives    Complete by:  As directed    Call MD for:  persistant dizziness or light-headedness    Complete by:  As directed    Call MD for:  persistant nausea and  vomiting    Complete by:  As directed    Call MD for:  severe uncontrolled pain    Complete by:  As directed    Call MD for:  temperature >100.4    Complete by:  As directed    Diet - low sodium heart healthy    Complete by:  As directed    Discharge instructions    Complete by:  As directed    Follow up with PCP, Cardiology, Gastroenterology, and Pulmonology as an outpatient if agreeable. Take all medications as prescribed and hold Asprin 325 until evaluated by Gastroenterology. If symptoms change or worsen please  return to the ED for evaluation.   Increase activity slowly    Complete by:  As directed      Allergies as of 05/21/2017      Reactions   Penicillins Anaphylaxis, Swelling   Has patient had a PCN reaction causing immediate rash, facial/tongue/throat swelling, SOB or lightheadedness with hypotension: Yes Has patient had a PCN reaction causing severe rash involving mucus membranes or skin necrosis: Yes Has patient had a PCN reaction that required hospitalization Yes Has patient had a PCN reaction occurring within the last 10 years: No If all of the above answers are "NO", then may proceed with Cephalosporin use.      Medication List    STOP taking these medications   aspirin EC 325 MG tablet   ferrous gluconate 240 (27 FE) MG tablet Commonly known as:  IRON 27     TAKE these medications   amLODipine 2.5 MG tablet Commonly known as:  NORVASC Take 1 tablet (2.5 mg total) by mouth daily.   CENTRUM SILVER 50+WOMEN Tabs Take 1 tablet by mouth daily.   feeding supplement Liqd Take 1 Container by mouth 2 (two) times daily between meals.   feeding supplement (ENSURE ENLIVE) Liqd Take 237 mLs by mouth daily.   furosemide 40 MG tablet Commonly known as:  LASIX Take 1 tablet (40 mg total) by mouth daily.   metoprolol tartrate 25 MG tablet Commonly known as:  LOPRESSOR Take 1 tablet (25 mg total) by mouth 2 (two) times daily.   pantoprazole 40 MG tablet Commonly known as:  PROTONIX Take 1 tablet (40 mg total) by mouth 2 (two) times daily.       Allergies  Allergen Reactions  . Penicillins Anaphylaxis and Swelling    Has patient had a PCN reaction causing immediate rash, facial/tongue/throat swelling, SOB or lightheadedness with hypotension: Yes Has patient had a PCN reaction causing severe rash involving mucus membranes or skin necrosis: Yes Has patient had a PCN reaction that required hospitalization Yes Has patient had a PCN reaction occurring within the last 10 years:  No If all of the above answers are "NO", then may proceed with Cephalosporin use.    Consultations:  Gastroenterology  Palliative Care Medicine  Procedures/Studies: Ct Abdomen Pelvis Wo Contrast  Result Date: 05/20/2017 CLINICAL DATA:  Chronic progressively worsening generalized weakness, chills, fatigue, malaise, shortness of breath and occasional productive cough and nausea. Decreased appetite. Postural dizziness and lower extremity edema. Initial encounter. EXAM: CT ABDOMEN AND PELVIS WITHOUT CONTRAST TECHNIQUE: Multidetector CT imaging of the abdomen and pelvis was performed following the standard protocol without IV contrast. COMPARISON:  PET/CT performed 09/08/2016 FINDINGS: Lower chest: A small right pleural effusion is noted. The heart is enlarged. Trace pericardial fluid remains within normal limits. Hepatobiliary: The liver is grossly unremarkable in appearance. A large stone is noted within the gallbladder.  The gallbladder is otherwise unremarkable. The common bile duct remains normal in caliber. Trace ascites is seen tracking about the liver. Pancreas: The pancreas is within normal limits. Spleen: The spleen is unremarkable in appearance. Adrenals/Urinary Tract: The adrenal glands are unremarkable in appearance. Bilateral renal cysts are noted, including a hyperdense cyst on the right. Nonspecific perinephric stranding is noted bilaterally. There is no evidence of hydronephrosis. No renal or ureteral stones are identified. Stomach/Bowel: The stomach is unremarkable in appearance. The small bowel is within normal limits. The appendix is normal in caliber, without evidence of appendicitis. The colon is unremarkable in appearance. Vascular/Lymphatic: Diffuse calcification is seen along the abdominal aorta and its branches. The abdominal aorta is otherwise grossly unremarkable. The inferior vena cava is grossly unremarkable. No retroperitoneal lymphadenopathy is seen. No pelvic sidewall  lymphadenopathy is identified. Reproductive: The bladder is mildly distended and grossly unremarkable. A 6.1 cm heterogeneous mass is noted at the left adnexa, reflecting the patient's known ovarian cancer. This has increased in size from the prior study. Trace ascites is noted within the pelvis. Scattered calcified fibroids are seen within the uterus. The right ovary is grossly unremarkable. Other:  A small right inguinal hernia is noted, containing only fat. Musculoskeletal: No acute osseous abnormalities are identified. Vacuum phenomenon is noted at L5-S1. Facet disease is noted at the lower lumbar spine. The visualized musculature is unremarkable in appearance. IMPRESSION: 1. 6.1 cm left adnexal mass has increased significantly in size, reflecting the patient's known ovarian cancer. This was identified on prior PET/CT. Surgical consultation is recommended. 2. Small right pleural effusion. 3. Cardiomegaly. 4. Cholelithiasis. Gallbladder otherwise unremarkable in appearance. 5. Trace ascites noted within the abdomen and pelvis. 6. Bilateral renal cysts, including a hyperdense cyst on the right. 7. Diffuse aortic atherosclerosis. 8. Calcified uterine fibroids noted. 9. Small right inguinal hernia, containing only fat. Electronically Signed   By: Garald Balding M.D.   On: 05/20/2017 20:50   Dg Chest 2 View  Result Date: 05/19/2017 CLINICAL DATA:  Weakness and nausea for 1 month EXAM: CHEST  2 VIEW COMPARISON:  04/28/2017 FINDINGS: Cardiac shadow remains enlarged. The overall inspiratory effort is stable. Mild vascular congestion is seen without definitive interstitial edema. No focal infiltrate is seen. Degenerative changes of the thoracic spine are noted. IMPRESSION: Mild vascular congestion. Electronically Signed   By: Inez Catalina M.D.   On: 05/19/2017 15:53   Dg Chest 2 View  Result Date: 04/28/2017 CLINICAL DATA:  CHF. EXAM: CHEST  2 VIEW COMPARISON:  01/04/2017. FINDINGS: Mediastinum and hilar  structures normal. Cardiomegaly with with normal pulmonary vascularity. Low lung volumes with mild basilar atelectasis. Small right pleural effusion cannot be excluded. Surgical clips left chest. IMPRESSION: 1. Cardiomegaly with normal pulmonary vascularity. 2. Low lung volumes with mild bibasilar atelectasis. Small right pleural effusion cannot be excluded. Electronically Signed   By: Marcello Moores  Register   On: 04/28/2017 11:12   Subjective: Seen and examined at bedside and was not very pleasant when I went to go speak with her and refused care. She allowed me to examine her but stated she did not want any more testing or labwork and stated she was going to refuse all medications because she wanted to go home and that coming into the hospital was a "mistake." Discussed plan of care with daughter over the phone and because patient was alert and oriented x3 and able to make her own decisions, she was going to be D/C'd home as she was refusing all  her care.   Discharge Exam: Vitals:   05/20/17 2223 05/21/17 0300  BP: (!) 157/82 (!) 153/64  Pulse: 73 83  Resp: 18   Temp: 99 F (37.2 C) 97.3 F (36.3 C)   Vitals:   05/20/17 1046 05/20/17 1452 05/20/17 2223 05/21/17 0300  BP: (!) 162/71 131/64 (!) 157/82 (!) 153/64  Pulse: 81 82 73 83  Resp: 19 19 18    Temp: 99.7 F (37.6 C) 97.8 F (36.6 C) 99 F (37.2 C) 97.3 F (36.3 C)  TempSrc: Oral Oral Oral Axillary  SpO2: 99% 100% 98% 100%  Weight:    54.4 kg (119 lb 14.9 oz)  Height:       General: Pt is thin frail cachetic AAF who is alert, awake, not in acute distress Cardiovascular: RRR, Loud 3/6 Systolic Murmur Respiratory: Diminished bilaterally, no wheezing, no rhonchi Abdominal: Soft, NT, ND, bowel sounds + Extremities: no edema, no cyanosis  The results of significant diagnostics from this hospitalization (including imaging, microbiology, ancillary and laboratory) are listed below for reference.    Microbiology: No results found for this  or any previous visit (from the past 240 hour(s)).   Labs: BNP (last 3 results)  Recent Labs  07/06/16 1954 01/01/17 1551 05/19/17 1621  BNP 1,416.9* 2,298.0* 8,127.5*   Basic Metabolic Panel:  Recent Labs Lab 05/19/17 1621 05/20/17 1604  NA 137 136  K 5.7* 5.3*  CL 108 109  CO2 20* 22  GLUCOSE 87 114*  BUN 54* 52*  CREATININE 1.74* 1.76*  CALCIUM 8.9 8.9  MG  --  2.3  PHOS  --  4.1   Liver Function Tests:  Recent Labs Lab 05/20/17 1604  AST 24  ALT 17  ALKPHOS 91  BILITOT 0.8  PROT 7.2  ALBUMIN 2.7*   No results for input(s): LIPASE, AMYLASE in the last 168 hours. No results for input(s): AMMONIA in the last 168 hours. CBC:  Recent Labs Lab 05/19/17 1621 05/20/17 1604  WBC 6.2 7.0  NEUTROABS 3.4 4.7  HGB 6.2* 9.3*  HCT 20.8* 30.1*  MCV 89.7 88.8  PLT 305 284   Cardiac Enzymes: No results for input(s): CKTOTAL, CKMB, CKMBINDEX, TROPONINI in the last 168 hours. BNP: Invalid input(s): POCBNP CBG:  Recent Labs Lab 05/19/17 1441  GLUCAP 81   D-Dimer No results for input(s): DDIMER in the last 72 hours. Hgb A1c No results for input(s): HGBA1C in the last 72 hours. Lipid Profile No results for input(s): CHOL, HDL, LDLCALC, TRIG, CHOLHDL, LDLDIRECT in the last 72 hours. Thyroid function studies No results for input(s): TSH, T4TOTAL, T3FREE, THYROIDAB in the last 72 hours.  Invalid input(s): FREET3 Anemia work up No results for input(s): VITAMINB12, FOLATE, FERRITIN, TIBC, IRON, RETICCTPCT in the last 72 hours. Urinalysis    Component Value Date/Time   COLORURINE YELLOW 01/01/2017 1713   APPEARANCEUR CLEAR 01/01/2017 1713   LABSPEC 1.025 01/01/2017 1713   PHURINE 5.5 01/01/2017 1713   GLUCOSEU NEGATIVE 01/01/2017 1713   HGBUR NEGATIVE 01/01/2017 1713   BILIRUBINUR NEGATIVE 01/01/2017 1713   KETONESUR NEGATIVE 01/01/2017 1713   PROTEINUR 100 (A) 01/01/2017 1713   UROBILINOGEN 0.2 08/16/2014 0535   NITRITE NEGATIVE 01/01/2017 1713    LEUKOCYTESUR TRACE (A) 01/01/2017 1713   Sepsis Labs Invalid input(s): PROCALCITONIN,  WBC,  LACTICIDVEN Microbiology No results found for this or any previous visit (from the past 240 hour(s)).  Time coordinating discharge: 35 minutes  SIGNED:  Kerney Elbe, DO [facility] 05/21/2017, 10:32 AM  Pager [phone]  If 7PM-7AM, please contact night-coverage [email or web address] Password [email or web address]

## 2017-05-21 NOTE — Consult Note (Signed)
Consultation Note Date: 05/21/2017   Patient Name: Valerie Downs  DOB: [birth year]  MRN: [id number]  Age / Sex: 83 y.o., female  PCP: Golden Circle, FNP Referring Physician: Kerney Elbe, DO  Reason for Consultation: Establishing goals of care  HPI/Patient Profile: 83 y.o. female  with past medical history of breast cancer, aortic insufficiency, mitral regurgitation, chronic blood loss anemia, chronic diastolic CHF, hypertension, chronic kidney disease, active smoker, suspected lung and ovarian cancer admitted on 05/19/2017 with chronic GI bleed s/p transfusion.  She has been refusing care including lab draws.  Palliative consulted for goals of care.   Clinical Assessment and Goals of Care: I met today with Valerie Downs.  She reports that she does not like the hospital and is tired of doctors telling her what to do.  She is withdrawn and very short when giving answers to questions.  She will not elaborate on 2-3 word answers despite prompting and does not really engage in conversation.  She reports that she is not convinced that she has cancer, and she states she will continue to refuse workup or treatment.  She did have labs drawn shortly before my arrival and when asked her thoughts about them, she states she will "get them on my time."  I attempted to discuss plan for CT scan as contrast was dropped off in room.  She states that she will "maybe drink it later if I feel like it."    Noted GI began discussion of Code Status and I also attempted to revisit this.  She stated, "maybe later" when I attempted to discuss   SUMMARY OF RECOMMENDATIONS   - [name] will not engage in conversation with me today.  In review of prior palliative consult in January of this year and statements se has made to other providers, I believe that her goals (feeling well, being out of the hospital, no invasive interventions,  no workup or treatment of suspected malignancies) would line up well with hospice support.  Unfortunately, she is not willing to discuss her goals and future care plan with me today.  I will attempt to follow up again tomorrow. - I believe she would benefit from having family as part of conversation.  She refused to let me call them to discuss.  Code Status/Advance Care Planning:  Full code  Prognosis:   < 6 months  Discharge Planning: To Be Determined      Primary Diagnoses: Present on Admission: . UGI bleed . Anemia . Chronic diastolic CHF (congestive heart failure) (Valerie Downs) . Cigarette smoker . HYPERTENSION, BENIGN SYSTEMIC . Hyperkalemia . Heme positive stool . AKI (acute kidney injury) (Valerie Downs) . Elevated brain natriuretic peptide (BNP) level   I have reviewed the medical record, interviewed the patient and family, and examined the patient. The following aspects are pertinent.  Past Medical History:  Diagnosis Date  . Aortic insufficiency   . Blood transfusion without reported diagnosis   . Breast cancer (Valerie Downs)   . Cardiomyopathy (Valerie Downs)   . CHF (  congestive heart failure) (Valerie Downs)   . History of echocardiogram    Echo 4/18: severe focal basal and mod conc LVH, EF 50-55, no RWMA, Gr 2 DD, mod AI, severe MR, mild LAE, mildly reduced RVSF, severe TR, mod PI, PASP 65, mild pericardial eff  . Hypertension   . Mitral regurgitation    Social History   Social History  . Marital status: Widowed    Spouse name: N/A  . Number of children: 10  . Years of education: 5   Occupational History  . Retired    Social History Main Topics  . Smoking status: Current Some Day Smoker    Packs/day: 0.25    Years: 50.00    Types: Cigarettes  . Smokeless tobacco: Former Systems developer    Types: Snuff     Comment: per patient has not used snuff in a while (10/18/16)  . Alcohol use No  . Drug use: No  . Sexual activity: No   Other Topics Concern  . None   Social History Narrative   Denies  abuse and feels safe at home.    Family History  Problem Relation Age of Onset  . Hypertension Mother   . Cancer Father    Scheduled Meds: . feeding supplement  1 Container Oral BID BM  . feeding supplement (ENSURE ENLIVE)  237 mL Oral Q24H  . multivitamin with minerals  1 tablet Oral Daily  . pantoprazole (PROTONIX) IV  40 mg Intravenous Q12H   Continuous Infusions: PRN Meds:.ipratropium-albuterol, metoprolol tartrate, ondansetron **OR** ondansetron (ZOFRAN) IV Medications Prior to Admission:  Prior to Admission medications   Medication Sig Start Date End Date Taking? Authorizing Provider  amLODipine (NORVASC) 2.5 MG tablet Take 1 tablet (2.5 mg total) by mouth daily. 04/01/17 06/30/17 Yes Weaver, Scott T, PA-C  aspirin EC 325 MG tablet Take 325 mg by mouth daily.   Yes [provider]  ferrous gluconate (IRON 27) 240 (27 FE) MG tablet Take 1 tablet (240 mg total) by mouth 2 (two) times daily. 01/06/17  Yes Short, Noah Delaine, MD  furosemide (LASIX) 40 MG tablet Take 1 tablet (40 mg total) by mouth daily. 04/03/17  Yes Weaver, Scott T, PA-C  metoprolol tartrate (LOPRESSOR) 25 MG tablet Take 1 tablet (25 mg total) by mouth 2 (two) times daily. 06/09/16  Yes Burtis Junes, NP  Multiple Vitamins-Minerals (CENTRUM SILVER 50+WOMEN) TABS Take 1 tablet by mouth daily.   Yes [provider]   Allergies  Allergen Reactions  . Penicillins Anaphylaxis and Swelling    Has patient had a PCN reaction causing immediate rash, facial/tongue/throat swelling, SOB or lightheadedness with hypotension: Yes Has patient had a PCN reaction causing severe rash involving mucus membranes or skin necrosis: Yes Has patient had a PCN reaction that required hospitalization Yes Has patient had a PCN reaction occurring within the last 10 years: No If all of the above answers are "NO", then may proceed with Cephalosporin use.    Review of Systems Refused to participate.  "I'm OK right now."  Physical  Exam  General: Alert, awake, in no acute distress. Eating small amounts from dinner tray. Frail and chronically ill appearing HEENT: No bruits, no goiter, no JVD Heart: Regular rate and rhythm. SE murmur appreciated. Lungs: Fair air movement, clear Abdomen: Soft, nontender, nondistended, positive bowel sounds.  Ext: Mild edema Skin: Warm and dry Neuro: Grossly intact, nonfocal.   Vital Signs: BP (!) 153/64 (BP Location: Right Arm)   Pulse 83   Temp  97.3 F (36.3 C) (Axillary)   Resp 18   Ht 5' 2"  (1.575 m)   Wt 54.4 kg (119 lb 14.9 oz)   SpO2 100%   BMI 21.94 kg/m  Pain Assessment: No/denies pain   Pain Score: 0-No pain   SpO2: SpO2: 100 % O2 Device:SpO2: 100 % O2 Flow Rate: .   IO: Intake/output summary:  Intake/Output Summary (Last 24 hours) at 05/21/17 0836 Last data filed at 05/21/17 0700  Gross per 24 hour  Intake              250 ml  Output             1450 ml  Net            -1200 ml    LBM: Last BM Date: 05/20/17 Baseline Weight: Weight: 58.1 kg (128 lb) Most recent weight: Weight: 54.4 kg (119 lb 14.9 oz)     Palliative Assessment/Data:     Time In: 1800 Time Out: 1850 Time Total: 50 Greater than 50%  of this time was spent counseling and coordinating care related to the above assessment and plan.  Signed by: Micheline Rough, MD   Please contact Palliative Medicine Team phone at [phone] for questions and concerns.  For individual provider: See Shea Evans

## 2017-05-21 NOTE — Care Management Note (Signed)
Case Management Note  Patient Details  Name: Valerie Downs MRN: [id number] Date of Birth: May 02, 1930  Subjective/Objective:    CHF, chronic blood loss anemia, HTN, UGIB                Action/Plan: Discharge Planning: NCM spoke to pt and dtr,Sandra at bedside. Pt lives in home with son, [name]. Pt has RW and bedside commode at home. Offered choice for HH/list provided. Pt's dtr requested [facility]. [place] and they do not have staff available to support referral. Contacted [facility] with new referral. Made dtr aware.   PCP Mauricio Po D MD  Expected Discharge Date:  05/21/17               Expected Discharge Plan:  [place]  In-House Referral:  Clinical Social Work, NA  Discharge planning Services  CM Consult  Post Acute Care Choice:  Home Health Choice offered to:  Adult Children  DME Arranged:  N/A DME Agency:  NA  HH Arranged:  RN, PT, OT HH Agency:  [facility]  Status of Service:  Completed, signed off  If discussed at [employer], dates discussed:    Additional Comments:  Erenest Rasher, RN 05/21/2017, 12:32 PM

## 2017-05-23 DIAGNOSIS — I5033 Acute on chronic diastolic (congestive) heart failure: Secondary | ICD-10-CM | POA: Diagnosis not present

## 2017-05-23 DIAGNOSIS — K922 Gastrointestinal hemorrhage, unspecified: Secondary | ICD-10-CM | POA: Diagnosis not present

## 2017-05-23 DIAGNOSIS — R918 Other nonspecific abnormal finding of lung field: Secondary | ICD-10-CM | POA: Diagnosis not present

## 2017-05-23 DIAGNOSIS — N183 Chronic kidney disease, stage 3 (moderate): Secondary | ICD-10-CM | POA: Diagnosis not present

## 2017-05-23 DIAGNOSIS — I080 Rheumatic disorders of both mitral and aortic valves: Secondary | ICD-10-CM | POA: Diagnosis not present

## 2017-05-23 DIAGNOSIS — D500 Iron deficiency anemia secondary to blood loss (chronic): Secondary | ICD-10-CM | POA: Diagnosis not present

## 2017-05-23 DIAGNOSIS — E43 Unspecified severe protein-calorie malnutrition: Secondary | ICD-10-CM | POA: Diagnosis not present

## 2017-05-23 DIAGNOSIS — I130 Hypertensive heart and chronic kidney disease with heart failure and stage 1 through stage 4 chronic kidney disease, or unspecified chronic kidney disease: Secondary | ICD-10-CM | POA: Diagnosis not present

## 2017-05-23 DIAGNOSIS — E875 Hyperkalemia: Secondary | ICD-10-CM | POA: Diagnosis not present

## 2017-05-25 DIAGNOSIS — I080 Rheumatic disorders of both mitral and aortic valves: Secondary | ICD-10-CM | POA: Diagnosis not present

## 2017-05-25 DIAGNOSIS — E875 Hyperkalemia: Secondary | ICD-10-CM | POA: Diagnosis not present

## 2017-05-25 DIAGNOSIS — K922 Gastrointestinal hemorrhage, unspecified: Secondary | ICD-10-CM | POA: Diagnosis not present

## 2017-05-25 DIAGNOSIS — E43 Unspecified severe protein-calorie malnutrition: Secondary | ICD-10-CM | POA: Diagnosis not present

## 2017-05-25 DIAGNOSIS — R918 Other nonspecific abnormal finding of lung field: Secondary | ICD-10-CM | POA: Diagnosis not present

## 2017-05-25 DIAGNOSIS — D500 Iron deficiency anemia secondary to blood loss (chronic): Secondary | ICD-10-CM | POA: Diagnosis not present

## 2017-05-25 DIAGNOSIS — I130 Hypertensive heart and chronic kidney disease with heart failure and stage 1 through stage 4 chronic kidney disease, or unspecified chronic kidney disease: Secondary | ICD-10-CM | POA: Diagnosis not present

## 2017-05-25 DIAGNOSIS — I5033 Acute on chronic diastolic (congestive) heart failure: Secondary | ICD-10-CM | POA: Diagnosis not present

## 2017-05-25 DIAGNOSIS — N183 Chronic kidney disease, stage 3 (moderate): Secondary | ICD-10-CM | POA: Diagnosis not present

## 2017-05-26 ENCOUNTER — Encounter: Payer: Medicare Other | Admitting: Hematology

## 2017-05-30 ENCOUNTER — Encounter: Payer: Self-pay | Admitting: Internal Medicine

## 2017-05-30 ENCOUNTER — Ambulatory Visit (INDEPENDENT_AMBULATORY_CARE_PROVIDER_SITE_OTHER): Payer: Medicare Other | Admitting: Internal Medicine

## 2017-05-30 VITALS — BP 144/60 | HR 82 | Ht 62.0 in | Wt 119.0 lb

## 2017-05-30 DIAGNOSIS — F17210 Nicotine dependence, cigarettes, uncomplicated: Secondary | ICD-10-CM | POA: Diagnosis not present

## 2017-05-30 DIAGNOSIS — R911 Solitary pulmonary nodule: Secondary | ICD-10-CM

## 2017-05-30 NOTE — Patient Instructions (Signed)
No regular pulmonary follow up is needed - if you get weak again it is most likely related to your low blood count and this can be evaluated / treated by your Primary care provider

## 2017-05-30 NOTE — Progress Notes (Signed)
@Patient  ID: Valerie Downs, female    DOB: 06/22/1930    MRN: [id number]     HPI: 83 yo female active smoker with hx of breast cancer (s/p mastectomy 1984) , ovarian mass and enlarging lung mass /LAN and    TEST  Bronchoscopy 12/31/16 >> atypical cells 4R node, benign tissue 7 node CT angio chest 01/01/17 >> pericardial effusion, 3.2 cm mass RLL, 5 mm nodule Lt apex Echo 01/02/17 >> severe LVH, EF 50 to 55%, mod AR, mild MR, severe LA dilation, mod RA dilation, PAS 83 mmHg, small pericardial effusion  01/28/2017 NP Post hospital follow up : Lung mass   underwent EBUS on 12/31/16 . She had chest pressure and increased sob on 1/6 and was admitted to hospital for  Sx anemia and pericardial effusion. ECHO done and showed EF 50-55%, Low normal LV systolic function; severe LVH; biatrial enlargement; moderate AI; mild MR and TR; severely elevatedpulmonary pressure; small pericardial effusion. EBUS results were nondiagnostic. .Was discussed that further testing may be indicated. She declined.Tami Ribas care consult . She declined hospice.  She declines further testing and hospice currently.  Finished up PT at home. Advised her if she changes her mind to call us back rec Follow up with Dr. Melvyn Novas  In 3 months and As needed   Call back if you change your mind on further testing /treatment or on hospice     04/28/2017  f/u ov/Valerie Downs re: lung mass / chf  Valerie Downs brought her and corraborates hx  Chief Complaint  Patient presents with  . Follow-up    Breathing is unchanged. She has the sensation mucus is in her throat and has occ dry cough.   doe = MMRC2 = can't walk a nl pace on a flat grade s sob but does fine slow and flat    does ok with shopping / breathing is ok but weak/feels both legs get tight  with mild swelling in am and worse as day goes on Severe night sweats x months worse with warmer weather, does not have a/c  rec Please remember to go to the lab and x-ray department downstairs in the  basement  for your tests - we will call you with the results when they are available. The key is to stop smoking completely before smoking completely stops you!  Please schedule a follow up office visit in 4 weeks, sooner if needed  Add: instruct to reduce Asa to 81 mg daily enteric, offer heme-onc consult if she'll go    Admit date: 05/19/2017 Discharge date: 05/21/2017  Admitted From: Home Disposition:  [place] PT/RN  Recommendations for Outpatient Follow-up:  1. Follow up with PCP in 1-2 weeks 2. Follow up with Gastroenterology as an Outpatient 3. Follow up with Cardiology as an Outpatient 4. Follow up with Pulmonology as an outpatient  5. Please obtain CMP/CBC, Mag, Phos in one week  Home Health: YES Equipment/Devices: Non recommended by PT  Discharge Condition: Guarded  CODE STATUS: FULL CODE Diet recommendation: Heart Healthy   Brief/Interim Summary: Valerie Downs a 83 y.o.femalewith medical history significant of aortic insufficiency, mitral regurgitation, chronic blood loss anemia, chronic diastolic CHF, hypertension, chronic kidney disease, active smoker and other comorbidities who presented tothe Emergency Department with complaints of progressively worse weakness for about one month associated with chills, fatigue, malaise, shortness of breath, occasionally productive cough of yellowish/brownish sputum, intermittent nausea, decreased appetite, postural dizziness and lower extremity edema. She states that she felt "just  miserable". She deniedfever, chest pain, diaphoresis, palpitations, abdominal pain, diarrhea, emesis, melena or hematochezia. She was worked up and admitted for Acute on Chronic Anemia with ? Upper GI Bleed.   Patient has been refusing most of her care while hospitalized and several attempts have been made by [facility] Staff including GI, Palliative Care, Nursing and myself to attempt to care for the patient. She adamantly has refused labwork and  states "it was a mistake to come in" and that she just wants to "go home." She finally agreed to the CT of Abdomen and Pelvis late last night but refuses any more testing. Palliative Care attempted to discuss Goals of Care with the patient she stated she was not interested and refused to talk Dr. Domingo Cocking. Patient is alert and oriented and understand her situation and is able to make her own medical decisions. She will be discharged because she is refusing all medical care and it was discussed with her the benefit of being treated but patient declined and states she is better and wants to go home and if anymore testing, or medication is administered she will refuse. Discussed plan with patient's daughter who understood and patient is going to be sent home with Home PT/RN as daughter agreed to that. Patient will need to follow up with PCP, Cardiology, Pulmonology, and Gastroenterology. Her PCP will need to broach the subject of possible Hospice as an outpatient as her Ovarian mass is getting larger and because she has several comorbidities. Patient is a high risk for Re-admission to the hospital.   Discharge Diagnoses:  Principal Problem:   Anemia Active Problems:   HYPERTENSION, BENIGN SYSTEMIC   Heme positive stool   AKI (acute kidney injury) ([place])   Cigarette smoker   Elevated brain natriuretic peptide (BNP) level   Chronic diastolic CHF (congestive heart failure) (H[facility])   UGI bleed   Hyperkalemia   Acute on chronic diastolic CHF (congestive heart failure) ([facility])   Protein-calorie malnutrition, severe  Chronic GI bleed/Anemia in the setting of ? Ovarian Cancer -Admittedto Telemetry/Inpatient. -Continue to Hold ASA -FOBT was Positive -S/p Transfusion of 2 units of pRBC's; Patient refusing labs and did not allow [place] to draw Labs to recheck Hb/Hbt and Kidney Function this AM. Hb/Hct improved to 9.3/30.1 yesterday -Gastroenterology Dr. Donnella Sham for further management and  evaluation -He does not feel like the patient has a colonic source for her Anemia given recent colonoscopy but feels as if it is small bowel/upper tract if she has having bleeding from bowel -Discussed Imagaing with CT Abd/Pelvis with oral Contrast to evaluate small bowel to ensure no mass lesions or evidence of metastatic disease; Ordered and patient finally agreed to have CT Abdomen/Pelvis last night which showed that her 6.1 cm left adnexal mass in size significantly; Patient refused for me to consult Surgery -Also discussed EGD and Capsule study but does not want any more testing to pursue full work up  -[place] for Goals of Care and patient refused to talk to Dr. Domingo Cocking and did not want to discuss hospice. Please see Dr. Kirstie Mirza note -Changed IV Protonix 40 mg q12h to po 40 mg BID -Continue to Hold ASA until seen by PCP and Gastroenterology as an outpatient   AKI on CKD Stage 3 from ABLA and CHF -Patient's Cr on 04/28/17 was 1.45 -BUN/Cr now 52/1.76; Refused Blood Work this AM -Was given IV Lasix for elevated BNP and suspected Volume Overload -Repeat CMP as an outpatient   Severe Protein  Calorie Malnutrition -Nutritionist Consulted and appreciated Recc's -C/w Boost/Resource Breeze 1 container po BID between meals -C/w Ensure Enlive 237 mL po q24h  Hyperkalemia. -K+ was 5.7 on admission and improved to 5.3; Likely from AKI -Received IV furosemide pre-transfusion and beta agonists bronchodilators. Would not allow [place] to recheck Labs this AM -Repeat Blood work ordered this AM but patient refused blood draws -Repeat CMP as an outpatient   Acute on chronic diastolic CHF (congestive heart failure) ([place]) -Received Furosemide IV 20 mgearlier during the admisson. -BNP was 4,112.4 on Admission -POC Troponin was 0.06 but patient refused to let us cycle Troponins  -Patient on IV Metoprolol 2.5 mg IV q6hprn; Resume home meds with Metoprolol -Strict I's and O's,  Daily Weights, SLIV -CT Abd/Pelvis showed Cardiomegaly with trace ascites noted in the abdomen and pelvis and a small Right Pleural Effusion  -Follow up with Cardiology as an outpatient   Tobacco Abuse -Declined Nicotine Replacement Therapy.  Essential Hypertension -Hld Amlodipine, po Furosemide and Metoprolol on Admission. -Continue to Monitorblood pressure. -Repeat CMP in AM  ? Lung Cancer -CT Angio in January this year showed The patient's known malignancy in the medial right lower lobe posterior to the right lower lobe bronchus is larger in theinterval. A pre tracheal node is more prominent in the interval. No other change within visualized lymph nodes. -EBUS results were non-diagnositc and patient declined further workup and hospice at that time -Dr. Melvyn Novas saw patient earlier this month and feels she has a low grade Lung Cancer and Ovarian cancer -Follow up with Pulmonology as an outpatient   Ovarian Mass likely Cancer -Seen on PET Scan -Patient has refused further workup -CT Abdomen/Pelvis done and showed that the Adnexal mass has significantly increased in size reflecting patient's known ovarian cancer and is now 6.1.  -Patient does not want Surgical Consultation  -Refusing further workup   05/30/2017  f/u ov/Valerie Downs re: post hosp f/u - refused hospice / still smoking  Chief Complaint  Patient presents with  . Follow-up    Breathing is unchanged. She c/o poor appetite.   doe x MMRC3 = can't walk 100 yards even at a slow pace at a flat grade s stopping due to sob  But better p transfusion  Does her own shopping at [place] gets let off at curb / no 02      No obvious day to day or daytime variability or assoc excess/ purulent sputum or mucus plugs or hemoptysis or cp or chest tightness, subjective wheeze or overt sinus or hb symptoms. No unusual exp hx or h/o childhood pna/ asthma or knowledge of premature birth.  Sleeping ok without nocturnal  or early am exacerbation   of respiratory  c/o's or need for noct saba. Also denies any obvious fluctuation of symptoms with weather or environmental changes or other aggravating or alleviating factors except as outlined above   Current Medications, Allergies, Complete Past Medical History, Past Surgical History, Family History, and Social History were reviewed in [employer] record.  ROS  The following are not active complaints unless bolded sore throat, dysphagia, dental problems, itching, sneezing,  nasal congestion or excess/ purulent secretions, ear ache,   fever, chills, sweats, unintended wt loss, classically pleuritic or exertional cp,  orthopnea pnd or leg swelling, presyncope, palpitations, abdominal pain, anorexia, nausea, vomiting, diarrhea  or change in bowel or bladder habits, change in stools or urine, dysuria,hematuria,  rash, arthralgias, visual complaints, headache, numbness, weakness or ataxia or problems with  walking or coordination,  change in mood/affect or memory.            Physical Exam     GEN: A/Ox3; pleasant , NAD, thin frail stoic black  female   05/30/2017   04/28/17 123 lb 3.2 oz (55.9 kg)  04/20/17 121 lb 3.2 oz (55 kg)  04/01/17 118 lb (53.5 kg)    Vital signs reviewed      - Note on arrival 02 sats  99% on RA     HEENT:  [place]/AT,  EACs-clear, TMs-wnl, NOSE-clear, THROAT-clear, no lesions, no postnasal drip or exudate noted.  Poor dentition  NECK:  Supple w/ fair ROM; no JVD; normal carotid impulses w/o bruits; no thyromegaly or nodules palpated; no lymphadenopathy.    RESP   Minimal insp and exp rhonchi s cough on insp or exp   CARD:  RRR, no m/r/g, no peripheral edema, pulses intact, no cyanosis or clubbing.  GI:   Soft & nt; nml bowel sounds; no organomegaly or masses detected.   Musco: Warm bil, no deformities or joint swelling noted.   Neuro: alert, no focal deficits noted.    Skin: Warm, no lesions or rashes      I personally reviewed images  and agree with radiology impression as follows:  CXR:   05/19/17  Mild vascular congestion.     Lab Results  Component Value Date   HGB 9.3 (L) 05/20/2017   HGB 6.2 (LL) 05/19/2017   HGB 7.7 Repeated and verified X2. (LL) 04/28/2017   HGB 8.8 (L) 04/01/2017                    Assessment & Plan:

## 2017-05-31 ENCOUNTER — Telehealth: Payer: Self-pay | Admitting: Family

## 2017-05-31 DIAGNOSIS — K922 Gastrointestinal hemorrhage, unspecified: Secondary | ICD-10-CM | POA: Diagnosis not present

## 2017-05-31 DIAGNOSIS — E43 Unspecified severe protein-calorie malnutrition: Secondary | ICD-10-CM | POA: Diagnosis not present

## 2017-05-31 DIAGNOSIS — D500 Iron deficiency anemia secondary to blood loss (chronic): Secondary | ICD-10-CM | POA: Diagnosis not present

## 2017-05-31 DIAGNOSIS — I5033 Acute on chronic diastolic (congestive) heart failure: Secondary | ICD-10-CM | POA: Diagnosis not present

## 2017-05-31 DIAGNOSIS — N183 Chronic kidney disease, stage 3 (moderate): Secondary | ICD-10-CM | POA: Diagnosis not present

## 2017-05-31 DIAGNOSIS — R918 Other nonspecific abnormal finding of lung field: Secondary | ICD-10-CM | POA: Diagnosis not present

## 2017-05-31 DIAGNOSIS — I080 Rheumatic disorders of both mitral and aortic valves: Secondary | ICD-10-CM | POA: Diagnosis not present

## 2017-05-31 DIAGNOSIS — E875 Hyperkalemia: Secondary | ICD-10-CM | POA: Diagnosis not present

## 2017-05-31 DIAGNOSIS — I130 Hypertensive heart and chronic kidney disease with heart failure and stage 1 through stage 4 chronic kidney disease, or unspecified chronic kidney disease: Secondary | ICD-10-CM | POA: Diagnosis not present

## 2017-05-31 NOTE — Telephone Encounter (Signed)
Called stating they were not able to get a blood sample, the Pt had not drank any water today and she was told to drink water today and tomorrow and they would get the sample on Thursday.

## 2017-06-01 NOTE — Telephone Encounter (Signed)
Noted  

## 2017-06-01 NOTE — Assessment & Plan Note (Signed)
Advised will probably improve appetite the day she quits smoking but unwilling to quit at this point

## 2017-06-01 NOTE — Assessment & Plan Note (Addendum)
PET 09/08/16  The 2 cm right lower lobe pulmonary lesion in the azygoesophageal recess is hypermetabolic with SUV max of 85.0. This is consistent with neoplasm. There are also hypermetabolic right paratracheal and left prevascular nodes. The right-sided paratracheal node measures 13 mm and SUV max is 5.7. The aorticopulmonary window node measures 10.5 mm and SUV max is 5.3. Note also large PET Pos L  ovarian mass  - Bronchoscopy 12/31/16 >> atypical cells 4R node, benign tissue 7 node  My sense is that the lung nodule is the least of her problems at this point but this is a moot issue as refuses further w/u  I had an extended final summary discussion with the patient reviewing all relevant studies completed to date including the details from here last admit  and  lasting 15 to 20 minutes of a 25 minute transition of care office visit on the following issues:   Refuses further w/u / hospice appropriate at this point > pulmonary f/u is prn

## 2017-06-02 DIAGNOSIS — R918 Other nonspecific abnormal finding of lung field: Secondary | ICD-10-CM | POA: Diagnosis not present

## 2017-06-02 DIAGNOSIS — N183 Chronic kidney disease, stage 3 (moderate): Secondary | ICD-10-CM | POA: Diagnosis not present

## 2017-06-02 DIAGNOSIS — I5033 Acute on chronic diastolic (congestive) heart failure: Secondary | ICD-10-CM | POA: Diagnosis not present

## 2017-06-02 DIAGNOSIS — E43 Unspecified severe protein-calorie malnutrition: Secondary | ICD-10-CM | POA: Diagnosis not present

## 2017-06-02 DIAGNOSIS — I080 Rheumatic disorders of both mitral and aortic valves: Secondary | ICD-10-CM | POA: Diagnosis not present

## 2017-06-02 DIAGNOSIS — D500 Iron deficiency anemia secondary to blood loss (chronic): Secondary | ICD-10-CM | POA: Diagnosis not present

## 2017-06-02 DIAGNOSIS — K922 Gastrointestinal hemorrhage, unspecified: Secondary | ICD-10-CM | POA: Diagnosis not present

## 2017-06-02 DIAGNOSIS — E875 Hyperkalemia: Secondary | ICD-10-CM | POA: Diagnosis not present

## 2017-06-02 DIAGNOSIS — I130 Hypertensive heart and chronic kidney disease with heart failure and stage 1 through stage 4 chronic kidney disease, or unspecified chronic kidney disease: Secondary | ICD-10-CM | POA: Diagnosis not present

## 2017-06-03 ENCOUNTER — Encounter: Payer: Self-pay | Admitting: Family

## 2017-06-03 DIAGNOSIS — I080 Rheumatic disorders of both mitral and aortic valves: Secondary | ICD-10-CM | POA: Diagnosis not present

## 2017-06-03 DIAGNOSIS — D500 Iron deficiency anemia secondary to blood loss (chronic): Secondary | ICD-10-CM | POA: Diagnosis not present

## 2017-06-03 DIAGNOSIS — K922 Gastrointestinal hemorrhage, unspecified: Secondary | ICD-10-CM | POA: Diagnosis not present

## 2017-06-03 DIAGNOSIS — I130 Hypertensive heart and chronic kidney disease with heart failure and stage 1 through stage 4 chronic kidney disease, or unspecified chronic kidney disease: Secondary | ICD-10-CM | POA: Diagnosis not present

## 2017-06-03 DIAGNOSIS — I5033 Acute on chronic diastolic (congestive) heart failure: Secondary | ICD-10-CM | POA: Diagnosis not present

## 2017-06-03 DIAGNOSIS — E43 Unspecified severe protein-calorie malnutrition: Secondary | ICD-10-CM | POA: Diagnosis not present

## 2017-06-03 DIAGNOSIS — R918 Other nonspecific abnormal finding of lung field: Secondary | ICD-10-CM | POA: Diagnosis not present

## 2017-06-03 DIAGNOSIS — Z0000 Encounter for general adult medical examination without abnormal findings: Secondary | ICD-10-CM | POA: Diagnosis not present

## 2017-06-03 DIAGNOSIS — N183 Chronic kidney disease, stage 3 (moderate): Secondary | ICD-10-CM | POA: Diagnosis not present

## 2017-06-03 DIAGNOSIS — E875 Hyperkalemia: Secondary | ICD-10-CM | POA: Diagnosis not present

## 2017-06-07 DIAGNOSIS — I5033 Acute on chronic diastolic (congestive) heart failure: Secondary | ICD-10-CM | POA: Diagnosis not present

## 2017-06-07 DIAGNOSIS — I080 Rheumatic disorders of both mitral and aortic valves: Secondary | ICD-10-CM | POA: Diagnosis not present

## 2017-06-07 DIAGNOSIS — E43 Unspecified severe protein-calorie malnutrition: Secondary | ICD-10-CM | POA: Diagnosis not present

## 2017-06-07 DIAGNOSIS — D500 Iron deficiency anemia secondary to blood loss (chronic): Secondary | ICD-10-CM | POA: Diagnosis not present

## 2017-06-07 DIAGNOSIS — K922 Gastrointestinal hemorrhage, unspecified: Secondary | ICD-10-CM | POA: Diagnosis not present

## 2017-06-07 DIAGNOSIS — N183 Chronic kidney disease, stage 3 (moderate): Secondary | ICD-10-CM | POA: Diagnosis not present

## 2017-06-07 DIAGNOSIS — R918 Other nonspecific abnormal finding of lung field: Secondary | ICD-10-CM | POA: Diagnosis not present

## 2017-06-07 DIAGNOSIS — I130 Hypertensive heart and chronic kidney disease with heart failure and stage 1 through stage 4 chronic kidney disease, or unspecified chronic kidney disease: Secondary | ICD-10-CM | POA: Diagnosis not present

## 2017-06-07 DIAGNOSIS — E875 Hyperkalemia: Secondary | ICD-10-CM | POA: Diagnosis not present

## 2017-06-10 ENCOUNTER — Telehealth: Payer: Self-pay | Admitting: Family

## 2017-06-10 DIAGNOSIS — N183 Chronic kidney disease, stage 3 (moderate): Secondary | ICD-10-CM | POA: Diagnosis not present

## 2017-06-10 DIAGNOSIS — I5033 Acute on chronic diastolic (congestive) heart failure: Secondary | ICD-10-CM | POA: Diagnosis not present

## 2017-06-10 DIAGNOSIS — D500 Iron deficiency anemia secondary to blood loss (chronic): Secondary | ICD-10-CM | POA: Diagnosis not present

## 2017-06-10 DIAGNOSIS — R918 Other nonspecific abnormal finding of lung field: Secondary | ICD-10-CM | POA: Diagnosis not present

## 2017-06-10 DIAGNOSIS — I130 Hypertensive heart and chronic kidney disease with heart failure and stage 1 through stage 4 chronic kidney disease, or unspecified chronic kidney disease: Secondary | ICD-10-CM | POA: Diagnosis not present

## 2017-06-10 DIAGNOSIS — K922 Gastrointestinal hemorrhage, unspecified: Secondary | ICD-10-CM | POA: Diagnosis not present

## 2017-06-10 DIAGNOSIS — E43 Unspecified severe protein-calorie malnutrition: Secondary | ICD-10-CM | POA: Diagnosis not present

## 2017-06-10 DIAGNOSIS — I080 Rheumatic disorders of both mitral and aortic valves: Secondary | ICD-10-CM | POA: Diagnosis not present

## 2017-06-10 DIAGNOSIS — E875 Hyperkalemia: Secondary | ICD-10-CM | POA: Diagnosis not present

## 2017-06-10 NOTE — Telephone Encounter (Signed)
Needs verbals for a Education officer, museum evaluation 1x1

## 2017-06-10 NOTE — Telephone Encounter (Signed)
LVM giving verbal orders

## 2017-06-14 ENCOUNTER — Telehealth: Payer: Self-pay | Admitting: Family

## 2017-06-14 DIAGNOSIS — I130 Hypertensive heart and chronic kidney disease with heart failure and stage 1 through stage 4 chronic kidney disease, or unspecified chronic kidney disease: Secondary | ICD-10-CM | POA: Diagnosis not present

## 2017-06-14 DIAGNOSIS — D500 Iron deficiency anemia secondary to blood loss (chronic): Secondary | ICD-10-CM | POA: Diagnosis not present

## 2017-06-14 DIAGNOSIS — I5033 Acute on chronic diastolic (congestive) heart failure: Secondary | ICD-10-CM | POA: Diagnosis not present

## 2017-06-14 DIAGNOSIS — N183 Chronic kidney disease, stage 3 (moderate): Secondary | ICD-10-CM | POA: Diagnosis not present

## 2017-06-14 DIAGNOSIS — K922 Gastrointestinal hemorrhage, unspecified: Secondary | ICD-10-CM | POA: Diagnosis not present

## 2017-06-14 DIAGNOSIS — E875 Hyperkalemia: Secondary | ICD-10-CM | POA: Diagnosis not present

## 2017-06-14 DIAGNOSIS — R918 Other nonspecific abnormal finding of lung field: Secondary | ICD-10-CM | POA: Diagnosis not present

## 2017-06-14 DIAGNOSIS — I080 Rheumatic disorders of both mitral and aortic valves: Secondary | ICD-10-CM | POA: Diagnosis not present

## 2017-06-14 DIAGNOSIS — E43 Unspecified severe protein-calorie malnutrition: Secondary | ICD-10-CM | POA: Diagnosis not present

## 2017-06-14 NOTE — Telephone Encounter (Signed)
I do not see where she was supposed to stop Lasix. Resume Lasix at 40 mg daily and continue to monitor weight. If not improved may need office visit. Ok to send refill of Lasix if needed.

## 2017-06-14 NOTE — Telephone Encounter (Signed)
Caryl Pina from [place] at [facility] called stating that the pt has a lot of fluid in her legs and feet. There appeared to be no fluid on Friday and today there is a lot. The pt has been drinking lots of water trying to stay cool and also had some soup that was not diluted yesterday. Caryl Pina stated that the pt was recently taken off her Lasix. Please advise.

## 2017-06-15 NOTE — Telephone Encounter (Signed)
I left a message with Caryl Pina from [facility] her of your response. I also spoke with the pt to let her know as well. She explained understanding and did not have any questions at this time.

## 2017-06-16 DIAGNOSIS — I130 Hypertensive heart and chronic kidney disease with heart failure and stage 1 through stage 4 chronic kidney disease, or unspecified chronic kidney disease: Secondary | ICD-10-CM | POA: Diagnosis not present

## 2017-06-16 DIAGNOSIS — N183 Chronic kidney disease, stage 3 (moderate): Secondary | ICD-10-CM | POA: Diagnosis not present

## 2017-06-16 DIAGNOSIS — D500 Iron deficiency anemia secondary to blood loss (chronic): Secondary | ICD-10-CM | POA: Diagnosis not present

## 2017-06-16 DIAGNOSIS — E875 Hyperkalemia: Secondary | ICD-10-CM | POA: Diagnosis not present

## 2017-06-16 DIAGNOSIS — K922 Gastrointestinal hemorrhage, unspecified: Secondary | ICD-10-CM | POA: Diagnosis not present

## 2017-06-16 DIAGNOSIS — E43 Unspecified severe protein-calorie malnutrition: Secondary | ICD-10-CM | POA: Diagnosis not present

## 2017-06-16 DIAGNOSIS — R918 Other nonspecific abnormal finding of lung field: Secondary | ICD-10-CM | POA: Diagnosis not present

## 2017-06-16 DIAGNOSIS — I5033 Acute on chronic diastolic (congestive) heart failure: Secondary | ICD-10-CM | POA: Diagnosis not present

## 2017-06-16 DIAGNOSIS — I080 Rheumatic disorders of both mitral and aortic valves: Secondary | ICD-10-CM | POA: Diagnosis not present

## 2017-06-22 DIAGNOSIS — N183 Chronic kidney disease, stage 3 (moderate): Secondary | ICD-10-CM | POA: Diagnosis not present

## 2017-06-22 DIAGNOSIS — E875 Hyperkalemia: Secondary | ICD-10-CM | POA: Diagnosis not present

## 2017-06-22 DIAGNOSIS — E43 Unspecified severe protein-calorie malnutrition: Secondary | ICD-10-CM | POA: Diagnosis not present

## 2017-06-22 DIAGNOSIS — I130 Hypertensive heart and chronic kidney disease with heart failure and stage 1 through stage 4 chronic kidney disease, or unspecified chronic kidney disease: Secondary | ICD-10-CM | POA: Diagnosis not present

## 2017-06-22 DIAGNOSIS — I080 Rheumatic disorders of both mitral and aortic valves: Secondary | ICD-10-CM | POA: Diagnosis not present

## 2017-06-22 DIAGNOSIS — I5033 Acute on chronic diastolic (congestive) heart failure: Secondary | ICD-10-CM | POA: Diagnosis not present

## 2017-06-22 DIAGNOSIS — D500 Iron deficiency anemia secondary to blood loss (chronic): Secondary | ICD-10-CM | POA: Diagnosis not present

## 2017-06-22 DIAGNOSIS — R918 Other nonspecific abnormal finding of lung field: Secondary | ICD-10-CM | POA: Diagnosis not present

## 2017-06-22 DIAGNOSIS — K922 Gastrointestinal hemorrhage, unspecified: Secondary | ICD-10-CM | POA: Diagnosis not present

## 2017-06-23 NOTE — Progress Notes (Signed)
   05/21/17 0954  PT Time Calculation  PT Start Time (ACUTE ONLY) 0933  PT Stop Time (ACUTE ONLY) 0948  PT Time Calculation (min) (ACUTE ONLY) 15 min  PT G-Codes **NOT FOR INPATIENT CLASS**  Functional Assessment Tool Used AM-PAC 6 Clicks Basic Mobility;Clinical judgement  Functional Limitation Mobility: Walking and moving around  Mobility: Walking and Moving Around Current Status (M6219) CI  Mobility: Walking and Moving Around Goal Status (I7125) CI  Mobility: Walking and Moving Around Discharge Status (I7129) CI  PT General Charges  $$ ACUTE PT VISIT 1 Procedure  PT Evaluation  $PT Eval Low Complexity 1 Procedure   Weston Anna, MPT [phone]

## 2017-07-01 DIAGNOSIS — K922 Gastrointestinal hemorrhage, unspecified: Secondary | ICD-10-CM | POA: Diagnosis not present

## 2017-07-01 DIAGNOSIS — R918 Other nonspecific abnormal finding of lung field: Secondary | ICD-10-CM | POA: Diagnosis not present

## 2017-07-01 DIAGNOSIS — E43 Unspecified severe protein-calorie malnutrition: Secondary | ICD-10-CM | POA: Diagnosis not present

## 2017-07-01 DIAGNOSIS — I130 Hypertensive heart and chronic kidney disease with heart failure and stage 1 through stage 4 chronic kidney disease, or unspecified chronic kidney disease: Secondary | ICD-10-CM | POA: Diagnosis not present

## 2017-07-01 DIAGNOSIS — D500 Iron deficiency anemia secondary to blood loss (chronic): Secondary | ICD-10-CM | POA: Diagnosis not present

## 2017-07-01 DIAGNOSIS — E875 Hyperkalemia: Secondary | ICD-10-CM | POA: Diagnosis not present

## 2017-07-01 DIAGNOSIS — N183 Chronic kidney disease, stage 3 (moderate): Secondary | ICD-10-CM | POA: Diagnosis not present

## 2017-07-01 DIAGNOSIS — I5033 Acute on chronic diastolic (congestive) heart failure: Secondary | ICD-10-CM | POA: Diagnosis not present

## 2017-07-01 DIAGNOSIS — I080 Rheumatic disorders of both mitral and aortic valves: Secondary | ICD-10-CM | POA: Diagnosis not present

## 2017-07-07 DIAGNOSIS — K922 Gastrointestinal hemorrhage, unspecified: Secondary | ICD-10-CM | POA: Diagnosis not present

## 2017-07-07 DIAGNOSIS — N183 Chronic kidney disease, stage 3 (moderate): Secondary | ICD-10-CM | POA: Diagnosis not present

## 2017-07-07 DIAGNOSIS — I130 Hypertensive heart and chronic kidney disease with heart failure and stage 1 through stage 4 chronic kidney disease, or unspecified chronic kidney disease: Secondary | ICD-10-CM | POA: Diagnosis not present

## 2017-07-07 DIAGNOSIS — I080 Rheumatic disorders of both mitral and aortic valves: Secondary | ICD-10-CM | POA: Diagnosis not present

## 2017-07-07 DIAGNOSIS — I5033 Acute on chronic diastolic (congestive) heart failure: Secondary | ICD-10-CM | POA: Diagnosis not present

## 2017-07-07 DIAGNOSIS — R918 Other nonspecific abnormal finding of lung field: Secondary | ICD-10-CM | POA: Diagnosis not present

## 2017-07-07 DIAGNOSIS — D500 Iron deficiency anemia secondary to blood loss (chronic): Secondary | ICD-10-CM | POA: Diagnosis not present

## 2017-07-07 DIAGNOSIS — E875 Hyperkalemia: Secondary | ICD-10-CM | POA: Diagnosis not present

## 2017-07-07 DIAGNOSIS — E43 Unspecified severe protein-calorie malnutrition: Secondary | ICD-10-CM | POA: Diagnosis not present

## 2017-07-12 DIAGNOSIS — E875 Hyperkalemia: Secondary | ICD-10-CM | POA: Diagnosis not present

## 2017-07-12 DIAGNOSIS — E43 Unspecified severe protein-calorie malnutrition: Secondary | ICD-10-CM | POA: Diagnosis not present

## 2017-07-12 DIAGNOSIS — I130 Hypertensive heart and chronic kidney disease with heart failure and stage 1 through stage 4 chronic kidney disease, or unspecified chronic kidney disease: Secondary | ICD-10-CM | POA: Diagnosis not present

## 2017-07-12 DIAGNOSIS — K922 Gastrointestinal hemorrhage, unspecified: Secondary | ICD-10-CM | POA: Diagnosis not present

## 2017-07-12 DIAGNOSIS — I5033 Acute on chronic diastolic (congestive) heart failure: Secondary | ICD-10-CM | POA: Diagnosis not present

## 2017-07-12 DIAGNOSIS — D500 Iron deficiency anemia secondary to blood loss (chronic): Secondary | ICD-10-CM | POA: Diagnosis not present

## 2017-07-12 DIAGNOSIS — I080 Rheumatic disorders of both mitral and aortic valves: Secondary | ICD-10-CM | POA: Diagnosis not present

## 2017-07-12 DIAGNOSIS — N183 Chronic kidney disease, stage 3 (moderate): Secondary | ICD-10-CM | POA: Diagnosis not present

## 2017-07-12 DIAGNOSIS — R918 Other nonspecific abnormal finding of lung field: Secondary | ICD-10-CM | POA: Diagnosis not present

## 2017-07-14 NOTE — Telephone Encounter (Signed)
LVM for Ashley to call back.

## 2017-07-14 NOTE — Telephone Encounter (Signed)
Valerie Downs is going to discharged the Pt for goals met but needs verbals for an additional visit to do so. Please call back   [phone]

## 2017-07-15 NOTE — Telephone Encounter (Signed)
Called back and spoke with Caryl Pina to give verbal ok for another visit to discharge pt.

## 2017-07-19 DIAGNOSIS — I5033 Acute on chronic diastolic (congestive) heart failure: Secondary | ICD-10-CM | POA: Diagnosis not present

## 2017-07-19 DIAGNOSIS — N183 Chronic kidney disease, stage 3 (moderate): Secondary | ICD-10-CM | POA: Diagnosis not present

## 2017-07-19 DIAGNOSIS — I080 Rheumatic disorders of both mitral and aortic valves: Secondary | ICD-10-CM | POA: Diagnosis not present

## 2017-07-19 DIAGNOSIS — R918 Other nonspecific abnormal finding of lung field: Secondary | ICD-10-CM | POA: Diagnosis not present

## 2017-07-19 DIAGNOSIS — I130 Hypertensive heart and chronic kidney disease with heart failure and stage 1 through stage 4 chronic kidney disease, or unspecified chronic kidney disease: Secondary | ICD-10-CM | POA: Diagnosis not present

## 2017-07-19 DIAGNOSIS — K922 Gastrointestinal hemorrhage, unspecified: Secondary | ICD-10-CM | POA: Diagnosis not present

## 2017-07-19 DIAGNOSIS — E875 Hyperkalemia: Secondary | ICD-10-CM | POA: Diagnosis not present

## 2017-07-19 DIAGNOSIS — E43 Unspecified severe protein-calorie malnutrition: Secondary | ICD-10-CM | POA: Diagnosis not present

## 2017-07-19 DIAGNOSIS — D500 Iron deficiency anemia secondary to blood loss (chronic): Secondary | ICD-10-CM | POA: Diagnosis not present

## 2017-08-19 ENCOUNTER — Inpatient Hospital Stay (HOSPITAL_COMMUNITY)
Admission: EM | Admit: 2017-08-19 | Discharge: 2017-08-21 | DRG: 291 | Disposition: A | Discharge status: Home / Self Care | Payer: Medicare Other | Attending: Family Medicine | Admitting: Family Medicine

## 2017-08-19 ENCOUNTER — Encounter (HOSPITAL_COMMUNITY): Payer: Self-pay | Admitting: *Deleted

## 2017-08-19 ENCOUNTER — Emergency Department (HOSPITAL_COMMUNITY): Payer: Medicare Other

## 2017-08-19 DIAGNOSIS — N183 Chronic kidney disease, stage 3 (moderate): Secondary | ICD-10-CM | POA: Diagnosis not present

## 2017-08-19 DIAGNOSIS — I5032 Chronic diastolic (congestive) heart failure: Secondary | ICD-10-CM | POA: Diagnosis not present

## 2017-08-19 DIAGNOSIS — I130 Hypertensive heart and chronic kidney disease with heart failure and stage 1 through stage 4 chronic kidney disease, or unspecified chronic kidney disease: Secondary | ICD-10-CM | POA: Diagnosis not present

## 2017-08-19 DIAGNOSIS — I110 Hypertensive heart disease with heart failure: Secondary | ICD-10-CM | POA: Diagnosis not present

## 2017-08-19 DIAGNOSIS — E440 Moderate protein-calorie malnutrition: Secondary | ICD-10-CM | POA: Diagnosis not present

## 2017-08-19 DIAGNOSIS — R0602 Shortness of breath: Secondary | ICD-10-CM | POA: Diagnosis not present

## 2017-08-19 DIAGNOSIS — I272 Pulmonary hypertension, unspecified: Secondary | ICD-10-CM | POA: Diagnosis not present

## 2017-08-19 DIAGNOSIS — I509 Heart failure, unspecified: Secondary | ICD-10-CM | POA: Diagnosis not present

## 2017-08-19 DIAGNOSIS — E875 Hyperkalemia: Secondary | ICD-10-CM | POA: Diagnosis present

## 2017-08-19 DIAGNOSIS — I5033 Acute on chronic diastolic (congestive) heart failure: Secondary | ICD-10-CM | POA: Diagnosis present

## 2017-08-19 DIAGNOSIS — N839 Noninflammatory disorder of ovary, fallopian tube and broad ligament, unspecified: Secondary | ICD-10-CM | POA: Diagnosis not present

## 2017-08-19 DIAGNOSIS — D500 Iron deficiency anemia secondary to blood loss (chronic): Secondary | ICD-10-CM | POA: Diagnosis present

## 2017-08-19 DIAGNOSIS — Z853 Personal history of malignant neoplasm of breast: Secondary | ICD-10-CM

## 2017-08-19 DIAGNOSIS — D509 Iron deficiency anemia, unspecified: Secondary | ICD-10-CM | POA: Diagnosis present

## 2017-08-19 DIAGNOSIS — Z9221 Personal history of antineoplastic chemotherapy: Secondary | ICD-10-CM | POA: Diagnosis not present

## 2017-08-19 DIAGNOSIS — I080 Rheumatic disorders of both mitral and aortic valves: Secondary | ICD-10-CM | POA: Diagnosis present

## 2017-08-19 DIAGNOSIS — D508 Other iron deficiency anemias: Secondary | ICD-10-CM

## 2017-08-19 DIAGNOSIS — I10 Essential (primary) hypertension: Secondary | ICD-10-CM | POA: Diagnosis not present

## 2017-08-19 DIAGNOSIS — N838 Other noninflammatory disorders of ovary, fallopian tube and broad ligament: Secondary | ICD-10-CM | POA: Diagnosis present

## 2017-08-19 DIAGNOSIS — D649 Anemia, unspecified: Secondary | ICD-10-CM | POA: Diagnosis not present

## 2017-08-19 DIAGNOSIS — I371 Nonrheumatic pulmonary valve insufficiency: Secondary | ICD-10-CM | POA: Diagnosis not present

## 2017-08-19 LAB — COMPREHENSIVE METABOLIC PANEL
ALT: 14 U/L (ref 14–54)
AST: 23 U/L (ref 15–41)
Albumin: 2.4 g/dL — ABNORMAL LOW (ref 3.5–5.0)
Alkaline Phosphatase: 98 U/L (ref 38–126)
Anion gap: 5 (ref 5–15)
BUN: 39 mg/dL — AB (ref 6–20)
CHLORIDE: 104 mmol/L (ref 101–111)
CO2: 27 mmol/L (ref 22–32)
CREATININE: 1.35 mg/dL — AB (ref 0.44–1.00)
Calcium: 9 mg/dL (ref 8.9–10.3)
GFR calc Af Amer: 40 mL/min — ABNORMAL LOW (ref >60)
GFR calc non Af Amer: 35 mL/min — ABNORMAL LOW (ref >60)
Glucose, Bld: 101 mg/dL — ABNORMAL HIGH (ref 65–99)
Potassium: 5.2 mmol/L — ABNORMAL HIGH (ref 3.5–5.1)
SODIUM: 136 mmol/L (ref 135–145)
Total Bilirubin: 0.7 mg/dL (ref 0.3–1.2)
Total Protein: 7.1 g/dL (ref 6.5–8.1)

## 2017-08-19 LAB — CBC WITH DIFFERENTIAL/PLATELET
BASOS ABS: 0 10*3/uL (ref 0.0–0.1)
BASOS PCT: 0 %
EOS ABS: 0 10*3/uL (ref 0.0–0.7)
EOS PCT: 0 %
HCT: 21 % — ABNORMAL LOW (ref 36.0–46.0)
Hemoglobin: 6 g/dL — CL (ref 12.0–15.0)
Lymphocytes Relative: 28 %
Lymphs Abs: 2.2 10*3/uL (ref 0.7–4.0)
MCH: 23.6 pg — ABNORMAL LOW (ref 26.0–34.0)
MCHC: 28.6 g/dL — ABNORMAL LOW (ref 30.0–36.0)
MCV: 82.7 fL (ref 78.0–100.0)
Monocytes Absolute: 0.6 10*3/uL (ref 0.1–1.0)
Monocytes Relative: 7 %
Neutro Abs: 5.1 10*3/uL (ref 1.7–7.7)
Neutrophils Relative %: 64 %
PLATELETS: 286 10*3/uL (ref 150–400)
RBC: 2.54 MIL/uL — AB (ref 3.87–5.11)
RDW: 17.5 % — ABNORMAL HIGH (ref 11.5–15.5)
WBC: 8 10*3/uL (ref 4.0–10.5)

## 2017-08-19 LAB — PREPARE RBC (CROSSMATCH)

## 2017-08-19 LAB — BRAIN NATRIURETIC PEPTIDE: B Natriuretic Peptide: >4500 pg/mL — ABNORMAL HIGH (ref 0.0–100.0)

## 2017-08-19 LAB — I-STAT TROPONIN, ED: Troponin i, poc: 0.04 ng/mL (ref 0.00–0.08)

## 2017-08-19 MED ORDER — FUROSEMIDE 10 MG/ML IJ SOLN
60.0000 mg | INTRAMUSCULAR | Status: AC
  Administered 2017-08-19: 60 mg via INTRAVENOUS
  Filled 2017-08-19: qty 8

## 2017-08-19 MED ORDER — SODIUM CHLORIDE 0.9 % IV SOLN
10.0000 mL/h | Freq: Once | INTRAVENOUS | Status: AC
  Administered 2017-08-20: 10 mL/h via INTRAVENOUS

## 2017-08-19 NOTE — ED Triage Notes (Signed)
Pt reports bila LE swelling and soreness.  Reports she is taking lasix but it's not working.  Pt also reports SOB and weakness.  Fell yesterday d/t weakness in her legs.  Pt has hx of CHF.

## 2017-08-19 NOTE — H&P (Signed)
Valerie Downs:[id number] DOB: Jun 23, 1930 DOA: 08/19/2017     PCP: [facility], FNP   Outpatient Specialists: [facility]  Patient coming from:    home Lives   With family    Chief Complaint: Shortness of breath and leg edema  HPI: Valerie Downs is a 83 y.o. female with medical history aortic insufficiency, mitral regurgitation, chronic blood loss anemia, chronic diastolic CHF, hypertension, chronic kidney disease, tobacco abuse  significant of breast cancer (s/p mastectomy 1984) , ovarian mass and enlarging lung mass /LAN     Presented with low extremity swelling and soreness. States she's been taking Lasix at home but doesn't seem to help. She have had some worsening shortness of breath and generalized fatigue this crisis orthopnea denies any fevers or chills some nausea but no vomiting she has persistent Dr. Donivan Scull: stools which she attributes to her drinking boost shakes.  She denies chest pain reports cough for the past 2 weeks that is dry.  NO blood in stool, no falls. Denies any pain.    Regarding pertinent Chronic problems:  Last ECHO 01/02/17  severe LVH, EF 50 to 55%, mod AR, mild MR, severe LA dilation, mod RA dilation, PAS 83 mmHg, small pericardial effusion. Patient has known history of vomiting as declined father testing but also refused hospice or palliative care consult. Patient has known ovarian mass has been getting larger she has refused care numerous occasions. Patient has known chronic GI bleed was admitted in May 2018 and required transfusion 2 units but then refused to have repeat blood work done. She refused father studies including EGD and a capsule study find agree to CT scan showed progressive enlargement of her ovarian mass. When  palliative care was consulted patient refused to talk to them  IN ER:  Temp (24hrs), Avg:98.1 F (36.7 C), Min:98.1 F (36.7 C), Max:98.1 F (36.7 C)      on arrival  ED Triage Vitals  Enc Vitals Group     BP  08/19/17 1955 (!) 148/74     Pulse Rate 08/19/17 1955 84     Resp 08/19/17 1955 18     Temp 08/19/17 1955 98.1 F (36.7 C)     Temp Source 08/19/17 1955 Oral     SpO2 08/19/17 1955 96 %     Weight --      Height --      Head Circumference --      Peak Flow --      Pain Score 08/19/17 2008 8     Pain Loc --      Pain Edu? --      Excl. in [place]? --   RR 29 is satting 98% HR 86 BP 154/69 Troponin 0.04 WBC 8.0 hemoglobin 6.0 Sodium 136K5.2 which is down from May albumin 2.4 creatinine 1.35 which is down from baseline 1.7 BNP >4500 CXR: Small pleural effusions mild pulmonary edema Following Medications were ordered in ER: Medications  0.9 %  sodium chloride infusion (not administered)  furosemide (LASIX) injection 60 mg (60 mg Intravenous Given 08/19/17 2307)      Hospitalist was called for admission for Acute on chronic diastolic CHF exacerbation and symptomatic anemia due to chronic GI bleed in the setting of medical noncompliance and refusal of care  Review of Systems:    Pertinent positives include: fatigue, weight loss Orthopnea Bilateral lower extremity swelling  shortness of breath at rest. melena, dyspnea on exertion,  Constitutional:  No weight loss, night  sweats, Fevers, chills, HEENT:  No headaches, Difficulty swallowing,Tooth/dental problems,Sore throat,  No sneezing, itching, ear ache, nasal congestion, post nasal drip,  Cardio-vascular:  No chest pain, , PND, anasarca, dizziness, palpitations.no  GI:  No heartburn, indigestion, abdominal pain, nausea, vomiting, diarrhea, change in bowel habits, loss of appetite, blood in stool, hematemesis Resp:   No excess mucus, no productive cough, No non-productive cough, No coughing up of blood.No change in color of mucus.No wheezing. Skin:  no rash or lesions. No jaundice GU:  no dysuria, change in color of urine, no urgency or frequency. No straining to urinate.  No flank pain.  Musculoskeletal:  No joint pain or no  joint swelling. No decreased range of motion. No back pain.  Psych:  No change in mood or affect. No depression or anxiety. No memory loss.  Neuro: no localizing neurological complaints, no tingling, no weakness, no double vision, no gait abnormality, no slurred speech, no confusion  As per HPI otherwise 10 point review of systems negative.   Past Medical History: Past Medical History:  Diagnosis Date  . Aortic insufficiency   . Blood transfusion without reported diagnosis   . Breast cancer ([place])   . Cardiomyopathy ([place])   . CHF (congestive heart failure) ([place])   . History of echocardiogram    Echo 4/18: severe focal basal and mod conc LVH, EF 50-55, no RWMA, Gr 2 DD, mod AI, severe MR, mild LAE, mildly reduced RVSF, severe TR, mod PI, PASP 65, mild pericardial eff  . Hypertension   . Mitral regurgitation    Past Surgical History:  Procedure Laterality Date  . COLONOSCOPY N/A 06/22/2014   Procedure: COLONOSCOPY;  Surgeon: Jerene Bears, MD;  Location: [facility];  Service: Endoscopy;  Laterality: N/A;  . ENDOBRONCHIAL ULTRASOUND N/A 12/31/2016   Procedure: ENDOBRONCHIAL ULTRASOUND;  Surgeon: Juanito Doom, MD;  Location: [facility];  Service: Cardiopulmonary;  Laterality: N/A;  . ESOPHAGOGASTRODUODENOSCOPY N/A 06/22/2014   Procedure: ESOPHAGOGASTRODUODENOSCOPY (EGD);  Surgeon: Jerene Bears, MD;  Location: [facility];  Service: Endoscopy;  Laterality: N/A;  . MASTECTOMY Left      Social History:  Ambulatory   walker      reports that she has been smoking Cigarettes.  She has a 12.50 pack-year smoking history. She has quit using smokeless tobacco. Her smokeless tobacco use included Snuff. She reports that she does not drink alcohol or use drugs.  Allergies:   Allergies  Allergen Reactions  . Penicillins Anaphylaxis and Swelling    Has patient had a PCN reaction causing immediate rash, facial/tongue/throat swelling, SOB or lightheadedness with hypotension: Yes Has  patient had a PCN reaction causing severe rash involving mucus membranes or skin necrosis: Yes Has patient had a PCN reaction that required hospitalization Yes Has patient had a PCN reaction occurring within the last 10 years: No If all of the above answers are "NO", then may proceed with Cephalosporin use.        Family History:  Family History  Problem Relation Age of Onset  . Hypertension Mother   . Cancer Father     Medications: Prior to Admission medications   Medication Sig Start Date End Date Taking? Authorizing Provider  amLODipine (NORVASC) 2.5 MG tablet Take 1 tablet (2.5 mg total) by mouth daily. 04/01/17 06/30/17  Richardson Dopp T, PA-C  feeding supplement (BOOST / RESOURCE BREEZE) LIQD Take 1 Container by mouth 2 (two) times daily between meals. 05/21/17   Raiford Noble Latif, DO  feeding supplement,  ENSURE ENLIVE, (ENSURE ENLIVE) LIQD Take 237 mLs by mouth daily. 05/21/17   Raiford Noble Latif, DO  furosemide (LASIX) 40 MG tablet Take 1 tablet (40 mg total) by mouth daily. 04/03/17   Richardson Dopp T, PA-C  metoprolol tartrate (LOPRESSOR) 25 MG tablet Take 1 tablet (25 mg total) by mouth 2 (two) times daily. 06/09/16   Burtis Junes, NP  pantoprazole (PROTONIX) 40 MG tablet Take 1 tablet (40 mg total) by mouth 2 (two) times daily. 05/21/17 05/21/18  Kerney Elbe, DO    Physical Exam: Patient Vitals for the past 24 hrs:  BP Temp Temp src Pulse Resp SpO2  08/19/17 2318 (!) 155/69 - - 86 (!) 29 98 %  08/19/17 2230 (!) 145/70 - - 89 (!) 33 99 %  08/19/17 1955 (!) 148/74 98.1 F (36.7 C) Oral 84 18 96 %    1. General:  in No Acute distress 2. Psychological: Alert and   Oriented 3. Head/ENT:   Moist Mucous Membranes                          Head Non traumatic, neck supple                            Poor Dentition 4. SKIN:  decreased Skin turgor,  Skin clean Dry and intact no rash 5. Heart: Regular rate and rhythm   Murmur noted but no Rub or gallop 6. Lungs:   no  wheezes some crackles   7. Abdomen: Soft,  non-tender, Non distended 8. Lower extremities: no clubbing, cyanosis, or edema 9. Neurologically Grossly intact, moving all 4 extremities equally  10. MSK: Normal range of motion   body mass index is unknown because there is no height or weight on file.  Labs on Admission:   Labs on Admission: I have personally reviewed following labs and imaging studies  CBC:  Recent Labs Lab 08/19/17 2014  WBC 8.0  NEUTROABS 5.1  HGB 6.0*  HCT 21.0*  MCV 82.7  PLT 160   Basic Metabolic Panel:  Recent Labs Lab 08/19/17 2014  NA 136  K 5.2*  CL 104  CO2 27  GLUCOSE 101*  BUN 39*  CREATININE 1.35*  CALCIUM 9.0   GFR: CrCl cannot be calculated (Unknown ideal weight.). Liver Function Tests:  Recent Labs Lab 08/19/17 2014  AST 23  ALT 14  ALKPHOS 98  BILITOT 0.7  PROT 7.1  ALBUMIN 2.4*   No results for input(s): LIPASE, AMYLASE in the last 168 hours. No results for input(s): AMMONIA in the last 168 hours. Coagulation Profile: No results for input(s): INR, PROTIME in the last 168 hours. Cardiac Enzymes: No results for input(s): CKTOTAL, CKMB, CKMBINDEX, TROPONINI in the last 168 hours. BNP (last 3 results)  Recent Labs  04/01/17 1142 04/28/17 1054  PROBNP 31,525* 3,325.0*   HbA1C: No results for input(s): HGBA1C in the last 72 hours. CBG: No results for input(s): GLUCAP in the last 168 hours. Lipid Profile: No results for input(s): CHOL, HDL, LDLCALC, TRIG, CHOLHDL, LDLDIRECT in the last 72 hours. Thyroid Function Tests: No results for input(s): TSH, T4TOTAL, FREET4, T3FREE, THYROIDAB in the last 72 hours. Anemia Panel: No results for input(s): VITAMINB12, FOLATE, FERRITIN, TIBC, IRON, RETICCTPCT in the last 72 hours.  Sepsis Labs: @LABRCNTIP (procalcitonin:4,lacticidven:4) )No results found for this or any previous visit (from the past 240 hour(s)).    UA  ordered  Lab Results  Component Value Date   HGBA1C  5.4 07/06/2016    CrCl cannot be calculated (Unknown ideal weight.).  BNP (last 3 results)  Recent Labs  04/01/17 1142 04/28/17 1054  PROBNP 31,525* 3,325.0*     ECG REPORT  Independently reviewed Rate: 94  Rhythm: irregular ST&T Change: No acute ischemic changes   QTC 490  There were no vitals filed for this visit.   Cultures:    Component Value Date/Time   SDES BLOOD RIGHT ANTECUBITAL 01/02/2017 0355   SPECREQUEST BOTTLES DRAWN AEROBIC AND ANAEROBIC 6ML 01/02/2017 0355   CULT  01/02/2017 0355    NO GROWTH 5 DAYS Performed at [place] 01/08/2017 FINAL 01/02/2017 0355     Radiological Exams on Admission: Dg Chest 2 View  Result Date: 08/19/2017 CLINICAL DATA:  Shortness of breath, leg swelling, CHF. EXAM: CHEST  2 VIEW COMPARISON:  Radiograph 05/19/2017 FINDINGS: Chronic cardiomegaly tortuosity of the thoracic aorta. Small bilateral pleural effusions which have increased from prior exam. Increased vascular congestion. Peribronchial cuffing suspicious for early pulmonary edema. No confluent airspace disease. No pneumothorax. Surgical clips in the left axilla. Broad-based rightward curvature of the thoracic spine. IMPRESSION: Mild CHF with progressive vascular congestion, small pleural effusions and mild pulmonary edema. Electronically Signed   By: Jeb Levering M.D.   On: 08/19/2017 22:12    Chart has been reviewed    Assessment/Plan  83 y.o. female with medical history aortic insufficiency, mitral regurgitation, chronic blood loss anemia, chronic diastolic CHF, hypertension, chronic kidney disease, tobacco abuse  significant of breast cancer (s/p mastectomy 1984) , ovarian mass and enlarging lung mass /LAN   Admitted for acute rhonchi diastolic CHF and symptomatic anemia  Present on Admission: . Acute on chronic diastolic CHF (congestive heart failure) (HCC) - Ordered IV Lasix rehydrate cycle cardiac cancer on patient at this point  agreeable to have repeat labs done in a.m. . Anemia - transfuse 2 units and repeat CBC in a.m. patient is not interested in GI workup at this point . Hyperkalemia - repeat in a.m. after Lasix administration . HYPERTENSION, BENIGN SYSTEMIC -  currently stable . Iron deficiency anemia - patient at this point not interested in completing a workup but is agreeable to get blood transfusion . Malnutrition of moderate degree (HCC) - continue boost and ensure check prealbumin patient refuses nutritional consult . Moderate to severe pulmonary hypertension (HCC) - may be contributing to some of her symptoms. Patient is not interested in extensive workup or interventions . Ovarian mass, left - progressive suspect malignant but patient has refused care and a past not interested in biopsy or aggressive intervention at this point   Other plan as per orders.  DVT prophylaxis:  SCD   Code Status:  FULL CODE  as per patient. She is not  Interested in Any interventions. When asked what she wants to do regarding her ovarian mass "states not interested in biopsy or operative intervention"  Family Communication:   Family  at  Bedside  plan of care was discussed with  Daughter and Rozanna Boer- daughter   Disposition Plan:     To home once workup is complete and patient is stable  patient refuses PT/OT nutritional evaluation                          Consults called: NONE patient has refused again GI, or surgical consult     Admission  status:   inpatient       Level of care    tele      I have spent a total of 57 min on this admission   Brandn Mcgath 08/19/2017, 1:40 AM   [facility]  Pager [phone]   after 2 AM please page floor coverage PA If 7AM-7PM, please contact the day team taking care of the patient  [email or web address]  Password [email or web address]

## 2017-08-19 NOTE — ED Provider Notes (Addendum)
[facility] DEPT Provider Note   CSN: [id number] Arrival date & time: 08/19/17  1922     History   Chief Complaint Chief Complaint  Patient presents with  . Leg Swelling    HPI Valerie Downs is a 83 y.o. female.  83yo F w/ PMH including CHF, HTN, mitral regurg, breast CA who p/w leg swelling and SOB. The patient reports worsening bilateral lower extremity swelling recently causing leg soreness. She has been taking her Lasix but states that it has not been improving her swelling. She also endorses shortness of breath, particularly when she tries to lay flat to sleep at night. She reports generalized weakness. She denies any chest pain. She has had mild cough, no fevers. She has had nausea but no vomiting or urinary symptoms. When asked about dark stools, she states that she has had dark stools because of drinking boost shakes.   The history is provided by the patient.    Past Medical History:  Diagnosis Date  . Aortic insufficiency   . Blood transfusion without reported diagnosis   . Breast cancer ([place])   . Cardiomyopathy ([place])   . CHF (congestive heart failure) ([place])   . History of echocardiogram    Echo 4/18: severe focal basal and mod conc LVH, EF 50-55, no RWMA, Gr 2 DD, mod AI, severe MR, mild LAE, mildly reduced RVSF, severe TR, mod PI, PASP 65, mild pericardial eff  . Hypertension   . Mitral regurgitation     Patient Active Problem List   Diagnosis Date Noted  . Hyperkalemia 05/20/2017  . Acute on chronic diastolic CHF (congestive heart failure) ([place]) 05/20/2017  . Protein-calorie malnutrition, severe 05/20/2017  . UGI bleed 05/19/2017  . Moderate to severe pulmonary hypertension ([place]) 01/11/2017  . Chronic diastolic CHF (congestive heart failure) ([place]) 01/11/2017  . Goals of care, counseling/discussion   . Acute renal failure (ARF) ([place]) 01/02/2017  . Right lower lobe lung mass 01/02/2017  . Fever 01/02/2017  . Elevated troponin 01/02/2017  . Pericardial  effusion 01/02/2017  . Elevated brain natriuretic peptide (BNP) level 01/02/2017  . Anemia 01/01/2017  . Medicare annual wellness visit, subsequent 11/04/2016  . Routine general medical examination at a health care facility 11/04/2016  . Cigarette smoker 09/17/2016  . Dyspnea on exertion 09/17/2016  . Solitary pulmonary nodule 09/16/2016  . Ovarian mass, left 07/07/2016  . Chest pain 07/06/2016  . AKI (acute kidney injury) ([place]) 08/16/2014  . GI bleeding 08/15/2014  . Rectal bleed 08/15/2014  . Heme positive stool 06/22/2014  . Symptomatic anemia 06/21/2014  . Iron deficiency anemia 06/21/2014  . Malnutrition of moderate degree ([place]) 07/28/2013  . UTI (lower urinary tract infection) 07/26/2013  . HYPERTENSION, BENIGN SYSTEMIC 02/23/2007    Past Surgical History:  Procedure Laterality Date  . COLONOSCOPY N/A 06/22/2014   Procedure: COLONOSCOPY;  Surgeon: Jerene Bears, MD;  Location[facility];  Service: Endoscopy;  Laterality: N/A;  . ENDOBRONCHIAL ULTRASOUND N/A 12/31/2016   Procedure: ENDOBRONCHIAL ULTRASOUND;  Surgeon: Juanito Doom, MD;  Location[facility];  Service: Cardiopulmonary;  Laterality: N/A;  . ESOPHAGOGASTRODUODENOSCOPY N/A 06/22/2014   Procedure: ESOPHAGOGASTRODUODENOSCOPY (EGD);  Surgeon: Jerene Bears, MD;  Location: [facility];  Service: Endoscopy;  Laterality: N/A;  . MASTECTOMY Left     OB History    Gravida Para Term Preterm AB Living   10 10 10          SAB TAB Ectopic Multiple Live Births  10       Home Medications    Prior to Admission medications   Medication Sig Start Date End Date Taking? Authorizing Provider  amLODipine (NORVASC) 2.5 MG tablet Take 1 tablet (2.5 mg total) by mouth daily. 04/01/17 06/30/17  Richardson Dopp T, PA-C  feeding supplement (BOOST / RESOURCE BREEZE) LIQD Take 1 Container by mouth 2 (two) times daily between meals. 05/21/17   Sheikh, [place], DO  feeding supplement, ENSURE ENLIVE, (ENSURE ENLIVE) LIQD  Take 237 mLs by mouth daily. 05/21/17   Raiford Noble Latif, DO  furosemide (LASIX) 40 MG tablet Take 1 tablet (40 mg total) by mouth daily. 04/03/17   Richardson Dopp T, PA-C  metoprolol tartrate (LOPRESSOR) 25 MG tablet Take 1 tablet (25 mg total) by mouth 2 (two) times daily. 06/09/16   Burtis Junes, NP  pantoprazole (PROTONIX) 40 MG tablet Take 1 tablet (40 mg total) by mouth 2 (two) times daily. 05/21/17 05/21/18  Kerney Elbe, DO    Family History Family History  Problem Relation Age of Onset  . Hypertension Mother   . Cancer Father     Social History Social History  Substance Use Topics  . Smoking status: Current Some Day Smoker    Packs/day: 0.25    Years: 50.00    Types: Cigarettes  . Smokeless tobacco: Former Systems developer    Types: Snuff     Comment: per patient has not used snuff in a while (10/18/16)  . Alcohol use No     Allergies   Penicillins   Review of Systems Review of Systems All other systems reviewed and are negative except that which was mentioned in HPI   Physical Exam Updated Vital Signs BP (!) 145/70   Pulse 89   Temp 98.1 F (36.7 C) (Oral)   Resp (!) 33   SpO2 99%   Physical Exam  Constitutional: She is oriented to person, place, and time. She appears well-developed. No distress.  Frail, chronically ill-appearing woman  HENT:  Head: Normocephalic and atraumatic.  Eyes: Pupils are equal, round, and reactive to light. Conjunctivae are normal.  Neck: Neck supple. JVD present.  Cardiovascular: Normal rate and regular rhythm.   Murmur heard. Pulmonary/Chest:  tachypneic with mildly increased WOB, no respiratory distress; crackles in bases b/l  Abdominal: Soft. Bowel sounds are normal. She exhibits no distension. There is no tenderness.  Musculoskeletal: She exhibits edema (2+ pitting b/l to knees).  Neurological: She is alert and oriented to person, place, and time.  Fluent speech  Skin: Skin is warm and dry. There is pallor.  Psychiatric:  Judgment normal.  Nursing note and vitals reviewed.    ED Treatments / Results  Labs (all labs ordered are listed, but only abnormal results are displayed) Labs Reviewed  CBC WITH DIFFERENTIAL/PLATELET - Abnormal; Notable for the following:       Result Value   RBC 2.54 (*)    Hemoglobin 6.0 (*)    HCT 21.0 (*)    MCH 23.6 (*)    MCHC 28.6 (*)    RDW 17.5 (*)    All other components within normal limits  COMPREHENSIVE METABOLIC PANEL - Abnormal; Notable for the following:    Potassium 5.2 (*)    Glucose, Bld 101 (*)    BUN 39 (*)    Creatinine, Ser 1.35 (*)    Albumin 2.4 (*)    GFR calc non Af Amer 35 (*)    GFR calc Af Amer 40 (*)  All other components within normal limits  BRAIN NATRIURETIC PEPTIDE - Abnormal; Notable for the following:    B Natriuretic Peptide >4,500.0 (*)    All other components within normal limits  I-STAT TROPONIN, ED  TYPE AND SCREEN  PREPARE RBC (CROSSMATCH)    EKG  EKG Interpretation  Date/Time:  Friday August 19 2017 22:23:37 EDT Ventricular Rate:  94 PR Interval:    QRS Duration: 93 QT Interval:  402 QTC Calculation: 490 R Axis:   -6 Text Interpretation:  Sinus rhythm Multiple premature complexes, vent & supraven Anterior infarct, old Irregular Interpretation limited secondary to artifact Confirmed by Theotis Burrow [phone]) on 08/19/2017 10:32:48 PM       Radiology Dg Chest 2 View  Result Date: 08/19/2017 CLINICAL DATA:  Shortness of breath, leg swelling, CHF. EXAM: CHEST  2 VIEW COMPARISON:  Radiograph 05/19/2017 FINDINGS: Chronic cardiomegaly tortuosity of the thoracic aorta. Small bilateral pleural effusions which have increased from prior exam. Increased vascular congestion. Peribronchial cuffing suspicious for early pulmonary edema. No confluent airspace disease. No pneumothorax. Surgical clips in the left axilla. Broad-based rightward curvature of the thoracic spine. IMPRESSION: Mild CHF with progressive vascular congestion,  small pleural effusions and mild pulmonary edema. Electronically Signed   By: Jeb Levering M.D.   On: 08/19/2017 22:12    Procedures .Critical Care Performed by: Sharlett Iles Authorized by: Sharlett Iles   Critical care provider statement:    Critical care time (minutes):  30   Critical care was necessary to treat or prevent imminent or life-threatening deterioration of the following conditions:  Cardiac failure (GI bleed)   Critical care was time spent personally by me on the following activities:  Blood draw for specimens, evaluation of patient's response to treatment, examination of patient, obtaining history from patient or surrogate, ordering and performing treatments and interventions, ordering and review of laboratory studies, ordering and review of radiographic studies and review of old charts     (including critical care time)  Medications Ordered in ED Medications  0.9 %  sodium chloride infusion (not administered)  furosemide (LASIX) injection 60 mg (60 mg Intravenous Given 08/19/17 2307)     Initial Impression / Assessment and Plan / ED Course  I have reviewed the triage vital signs and the nursing notes.  Pertinent labs & imaging results that were available during my care of the patient were reviewed by me and considered in my medical decision making (see chart for details).    PT w/ h/o CHF, recent Hospitalization with GI bleed but refused further GI workup, presents with worsening leg swelling despite using lasix. She was chronically ill appearing, tachypneic but no acute distress. VS stable. Crackles b/l and JVD.   Labs show BNP >4500, K 5.2, cr. 1.35, Hgb 6. I suspect she has continued to have GI bleed. Negative trop. Chest x-ray shows pulmonary edema. Gave the patient IV Lasix and also 2 units pRBCs to be given slowly to try to minimize further exacerbation of volume overload. Discussed admission with Dr. Roel Cluck and pt admitted for further  care. Final Clinical Impressions(s) / ED Diagnoses   Final diagnoses:  Acute on chronic congestive heart failure, unspecified heart failure type ([place])  Anemia, unspecified type    New Prescriptions New Prescriptions   No medications on file     Abril Cappiello, Wenda Overland, MD 08/19/17 2336    Rex Kras, Wenda Overland, MD 08/20/17 [phone]

## 2017-08-20 ENCOUNTER — Encounter (HOSPITAL_COMMUNITY): Payer: Self-pay

## 2017-08-20 DIAGNOSIS — Z853 Personal history of malignant neoplasm of breast: Secondary | ICD-10-CM | POA: Diagnosis not present

## 2017-08-20 DIAGNOSIS — I080 Rheumatic disorders of both mitral and aortic valves: Secondary | ICD-10-CM | POA: Diagnosis not present

## 2017-08-20 DIAGNOSIS — I5033 Acute on chronic diastolic (congestive) heart failure: Secondary | ICD-10-CM | POA: Diagnosis not present

## 2017-08-20 DIAGNOSIS — N839 Noninflammatory disorder of ovary, fallopian tube and broad ligament, unspecified: Secondary | ICD-10-CM | POA: Diagnosis present

## 2017-08-20 DIAGNOSIS — I272 Pulmonary hypertension, unspecified: Secondary | ICD-10-CM | POA: Diagnosis present

## 2017-08-20 DIAGNOSIS — D509 Iron deficiency anemia, unspecified: Secondary | ICD-10-CM | POA: Diagnosis present

## 2017-08-20 DIAGNOSIS — Z9221 Personal history of antineoplastic chemotherapy: Secondary | ICD-10-CM | POA: Diagnosis not present

## 2017-08-20 DIAGNOSIS — E440 Moderate protein-calorie malnutrition: Secondary | ICD-10-CM | POA: Diagnosis not present

## 2017-08-20 DIAGNOSIS — D649 Anemia, unspecified: Secondary | ICD-10-CM | POA: Diagnosis not present

## 2017-08-20 DIAGNOSIS — I371 Nonrheumatic pulmonary valve insufficiency: Secondary | ICD-10-CM | POA: Diagnosis not present

## 2017-08-20 DIAGNOSIS — N183 Chronic kidney disease, stage 3 (moderate): Secondary | ICD-10-CM | POA: Diagnosis present

## 2017-08-20 DIAGNOSIS — I10 Essential (primary) hypertension: Secondary | ICD-10-CM | POA: Diagnosis not present

## 2017-08-20 DIAGNOSIS — I5032 Chronic diastolic (congestive) heart failure: Secondary | ICD-10-CM | POA: Diagnosis not present

## 2017-08-20 DIAGNOSIS — D500 Iron deficiency anemia secondary to blood loss (chronic): Secondary | ICD-10-CM | POA: Diagnosis present

## 2017-08-20 DIAGNOSIS — I130 Hypertensive heart and chronic kidney disease with heart failure and stage 1 through stage 4 chronic kidney disease, or unspecified chronic kidney disease: Secondary | ICD-10-CM | POA: Diagnosis not present

## 2017-08-20 DIAGNOSIS — I509 Heart failure, unspecified: Secondary | ICD-10-CM | POA: Diagnosis not present

## 2017-08-20 DIAGNOSIS — E875 Hyperkalemia: Secondary | ICD-10-CM | POA: Diagnosis present

## 2017-08-20 LAB — COMPREHENSIVE METABOLIC PANEL
ALT: 15 U/L (ref 14–54)
ANION GAP: 6 (ref 5–15)
AST: 28 U/L (ref 15–41)
Albumin: 2.6 g/dL — ABNORMAL LOW (ref 3.5–5.0)
Alkaline Phosphatase: 97 U/L (ref 38–126)
BUN: 44 mg/dL — ABNORMAL HIGH (ref 6–20)
CHLORIDE: 105 mmol/L (ref 101–111)
CO2: 27 mmol/L (ref 22–32)
Calcium: 9.1 mg/dL (ref 8.9–10.3)
Creatinine, Ser: 1.41 mg/dL — ABNORMAL HIGH (ref 0.44–1.00)
GFR, EST AFRICAN AMERICAN: 38 mL/min — AB (ref >60)
GFR, EST NON AFRICAN AMERICAN: 33 mL/min — AB (ref >60)
Glucose, Bld: 119 mg/dL — ABNORMAL HIGH (ref 65–99)
POTASSIUM: 5 mmol/L (ref 3.5–5.1)
SODIUM: 138 mmol/L (ref 135–145)
Total Bilirubin: 1 mg/dL (ref 0.3–1.2)
Total Protein: 7.4 g/dL (ref 6.5–8.1)

## 2017-08-20 LAB — URINALYSIS, ROUTINE W REFLEX MICROSCOPIC
Bilirubin Urine: NEGATIVE
Glucose, UA: NEGATIVE mg/dL
Hgb urine dipstick: NEGATIVE
KETONES UR: NEGATIVE mg/dL
Nitrite: NEGATIVE
PROTEIN: 30 mg/dL — AB
Specific Gravity, Urine: 1.01 (ref 1.005–1.030)
pH: 6 (ref 5.0–8.0)

## 2017-08-20 LAB — CBC
HEMATOCRIT: 31.3 % — AB (ref 36.0–46.0)
HEMOGLOBIN: 9.3 g/dL — AB (ref 12.0–15.0)
MCH: 25.3 pg — ABNORMAL LOW (ref 26.0–34.0)
MCHC: 29.7 g/dL — ABNORMAL LOW (ref 30.0–36.0)
MCV: 85.1 fL (ref 78.0–100.0)
PLATELETS: 242 10*3/uL (ref 150–400)
RBC: 3.68 MIL/uL — AB (ref 3.87–5.11)
RDW: 16.8 % — ABNORMAL HIGH (ref 11.5–15.5)
WBC: 10.1 10*3/uL (ref 4.0–10.5)

## 2017-08-20 LAB — PHOSPHORUS: PHOSPHORUS: 4.4 mg/dL (ref 2.5–4.6)

## 2017-08-20 LAB — TSH: TSH: 0.916 u[IU]/mL (ref 0.350–4.500)

## 2017-08-20 LAB — MAGNESIUM: Magnesium: 2 mg/dL (ref 1.7–2.4)

## 2017-08-20 MED ORDER — ACETAMINOPHEN 650 MG RE SUPP: 650.0000 mg | Freq: Four times a day (QID) | RECTAL | Status: DC | PRN

## 2017-08-20 MED ORDER — ENALAPRILAT 1.25 MG/ML IV SOLN
0.6250 mg | Freq: Once | INTRAVENOUS | Status: AC
  Administered 2017-08-20: 0.625 mg via INTRAVENOUS
  Filled 2017-08-20: qty 0.5

## 2017-08-20 MED ORDER — ONDANSETRON HCL 4 MG PO TABS: 4.0000 mg | ORAL_TABLET | Freq: Four times a day (QID) | ORAL | Status: DC | PRN

## 2017-08-20 MED ORDER — ENSURE ENLIVE PO LIQD
237.0000 mL | ORAL | Status: DC
  Administered 2017-08-20 – 2017-08-21 (×2): 237 mL via ORAL

## 2017-08-20 MED ORDER — BOOST / RESOURCE BREEZE PO LIQD
1.0000 | Freq: Two times a day (BID) | ORAL | Status: DC
  Administered 2017-08-20: 1 via ORAL

## 2017-08-20 MED ORDER — HYDROCODONE-ACETAMINOPHEN 5-325 MG PO TABS: 1.0000 | ORAL_TABLET | ORAL | Status: DC | PRN

## 2017-08-20 MED ORDER — ONDANSETRON HCL 4 MG/2ML IJ SOLN: 4.0000 mg | Freq: Four times a day (QID) | INTRAMUSCULAR | Status: DC | PRN

## 2017-08-20 MED ORDER — ACETAMINOPHEN 325 MG PO TABS: 650.0000 mg | ORAL_TABLET | Freq: Four times a day (QID) | ORAL | Status: DC | PRN

## 2017-08-20 MED ORDER — DIPHENHYDRAMINE HCL 25 MG PO CAPS
25.0000 mg | ORAL_CAPSULE | Freq: Once | ORAL | Status: AC
  Administered 2017-08-20: 25 mg via ORAL
  Filled 2017-08-20: qty 1

## 2017-08-20 MED ORDER — SODIUM CHLORIDE 0.9 % IV SOLN
Freq: Once | INTRAVENOUS | Status: AC
  Administered 2017-08-20: 04:00:00 via INTRAVENOUS

## 2017-08-20 MED ORDER — FUROSEMIDE 10 MG/ML IJ SOLN
20.0000 mg | Freq: Once | INTRAMUSCULAR | Status: AC
  Administered 2017-08-20: 20 mg via INTRAVENOUS
  Filled 2017-08-20: qty 2

## 2017-08-20 MED ORDER — FUROSEMIDE 10 MG/ML IJ SOLN
40.0000 mg | Freq: Two times a day (BID) | INTRAMUSCULAR | Status: DC
  Administered 2017-08-20 – 2017-08-21 (×3): 40 mg via INTRAVENOUS
  Filled 2017-08-20 (×2): qty 4

## 2017-08-20 MED ORDER — ACETAMINOPHEN 325 MG PO TABS
650.0000 mg | ORAL_TABLET | Freq: Once | ORAL | Status: AC
  Administered 2017-08-20: 650 mg via ORAL
  Filled 2017-08-20: qty 2

## 2017-08-20 NOTE — Progress Notes (Signed)
PROGRESS NOTE    Valerie Downs  WCH:[id number] DOB: July 18, 1930 DOA: 08/19/2017 PCP: Golden Circle, FNP   Brief Narrative: Valerie Downs is a 83 y.o. chemotherapy history of urinary insufficiency, mitral valve regurgitation, chronic blood loss anemia, chronic diastolic heart failure, kidney disease, tobacco abuse, ovarian mass concerning for cancer in addition to enlarging lung mass. Patient presented with dyspnea and leg edema concerning for CHF exacerbation. She was also found to have hemoglobin of 6. She was started on Lasix IV and given 2 units of blood.   Assessment & Plan:   Active Problems:   HYPERTENSION, BENIGN SYSTEMIC   Malnutrition of moderate degree (HCC)   Iron deficiency anemia   Ovarian mass, left   Anemia   Moderate to severe pulmonary hypertension (HCC)   Hyperkalemia   Acute on chronic diastolic CHF (congestive heart failure) (HCC)   Acute on chronic diastolic heart failure Poor urine output with diuresis. Still with dyspnea -continue lasix -strict in/out -daily weights -echocardiogram  Anemia Receiving two units PRBC. -post transfusion H&H -PT eval  Hyperkalemia Resolved.  Malnutrition, moderate -continue supplement  Pulmonary hypertension -echocardiogram  Ovarian mass Left. Likely malignancy but patient does not want workup. Outpatient follow-up. Patient continues to decline palliative care consult and reaffirms desire for full code  CKD stage 3 Stable.   DVT prophylaxis: SCDs Code Status: Full code Family Communication: None at bedside Disposition Plan: Discharge in 24-48 hours   Consultants:   None  Procedures:   None  Antimicrobials:  None    Subjective: Dyspnea. No chest pain  Objective: Vitals:   08/20/17 0540 08/20/17 0600 08/20/17 0850 08/20/17 1022  BP: (!) 154/78 (!) 148/75 (!) 149/73 (!) 147/78  Pulse: 89 88 83 63  Resp: 20 20 16 18   Temp: 98.2 F (36.8 C) 98.1 F (36.7 C) (!) 97.5 F (36.4 C) (!) 97.1  F (36.2 C)  TempSrc: Oral Oral Oral Oral  SpO2:  100% 100% 100%  Weight:      Height:        Intake/Output Summary (Last 24 hours) at 08/20/17 1301 Last data filed at 08/20/17 1100  Gross per 24 hour  Intake          1567.06 ml  Output              400 ml  Net          1167.06 ml   Filed Weights   08/20/17 0229  Weight: 58.5 kg (128 lb 15.5 oz)    Examination:  General exam: Appears calm and comfortable Respiratory system: Diminished. No crackles. Respiratory effort normal. Cardiovascular system: S1 & S2 heard, RRR. No murmurs, rubs, gallops or clicks. Gastrointestinal system: Abdomen is nondistended, soft and nontender. Normal bowel sounds heard. Central nervous system: somnolent but arousable and oriented. No focal neurological deficits. Extremities: No edema. No calf tenderness Skin: No cyanosis. No rashes Psychiatry: Judgement and insight appear normal. Mood & affect appropriate.     Data Reviewed: I have personally reviewed following labs and imaging studies  CBC:  Recent Labs Lab 08/19/17 2014 08/20/17 1105  WBC 8.0 10.1  NEUTROABS 5.1  --   HGB 6.0* 9.3*  HCT 21.0* 31.3*  MCV 82.7 85.1  PLT 286 353   Basic Metabolic Panel:  Recent Labs Lab 08/19/17 2014 08/20/17 1105  NA 136 138  K 5.2* 5.0  CL 104 105  CO2 27 27  GLUCOSE 101* 119*  BUN 39* 44*  CREATININE 1.35* 1.41*  CALCIUM 9.0 9.1  MG  --  2.0  PHOS  --  4.4   GFR: Estimated Creatinine Clearance: 25.2 mL/min (A) (by C-G formula based on SCr of 1.41 mg/dL (H)). Liver Function Tests:  Recent Labs Lab 08/19/17 2014 08/20/17 1105  AST 23 28  ALT 14 15  ALKPHOS 98 97  BILITOT 0.7 1.0  PROT 7.1 7.4  ALBUMIN 2.4* 2.6*   No results for input(s): LIPASE, AMYLASE in the last 168 hours. No results for input(s): AMMONIA in the last 168 hours. Coagulation Profile: No results for input(s): INR, PROTIME in the last 168 hours. Cardiac Enzymes: No results for input(s): CKTOTAL, CKMB,  CKMBINDEX, TROPONINI in the last 168 hours. BNP (last 3 results)  Recent Labs  04/01/17 1142 04/28/17 1054  PROBNP 31,525* 3,325.0*   HbA1C: No results for input(s): HGBA1C in the last 72 hours. CBG: No results for input(s): GLUCAP in the last 168 hours. Lipid Profile: No results for input(s): CHOL, HDL, LDLCALC, TRIG, CHOLHDL, LDLDIRECT in the last 72 hours. Thyroid Function Tests:  Recent Labs  08/20/17 1105  TSH 0.916   Anemia Panel: No results for input(s): VITAMINB12, FOLATE, FERRITIN, TIBC, IRON, RETICCTPCT in the last 72 hours. Sepsis Labs: No results for input(s): PROCALCITON, LATICACIDVEN in the last 168 hours.  No results found for this or any previous visit (from the past 240 hour(s)).       Radiology Studies: Dg Chest 2 View  Result Date: 08/19/2017 CLINICAL DATA:  Shortness of breath, leg swelling, CHF. EXAM: CHEST  2 VIEW COMPARISON:  Radiograph 05/19/2017 FINDINGS: Chronic cardiomegaly tortuosity of the thoracic aorta. Small bilateral pleural effusions which have increased from prior exam. Increased vascular congestion. Peribronchial cuffing suspicious for early pulmonary edema. No confluent airspace disease. No pneumothorax. Surgical clips in the left axilla. Broad-based rightward curvature of the thoracic spine. IMPRESSION: Mild CHF with progressive vascular congestion, small pleural effusions and mild pulmonary edema. Electronically Signed   By: Jeb Levering M.D.   On: 08/19/2017 22:12        Scheduled Meds: . feeding supplement  1 Container Oral BID BM  . feeding supplement (ENSURE ENLIVE)  237 mL Oral Q24H  . furosemide  40 mg Intravenous BID   Continuous Infusions:   LOS: 0 days     Cordelia Poche, MD [facility] 08/20/2017, 1:01 PM Pager: [phone]  If 7PM-7AM, please contact night-coverage [email or web address] Password [email or web address] 08/20/2017, 1:01 PM

## 2017-08-20 NOTE — Progress Notes (Signed)
RN may call Mechele Claude for report at [phone] at [phone].

## 2017-08-21 ENCOUNTER — Inpatient Hospital Stay (HOSPITAL_COMMUNITY): Payer: Medicare Other

## 2017-08-21 DIAGNOSIS — I5032 Chronic diastolic (congestive) heart failure: Secondary | ICD-10-CM

## 2017-08-21 DIAGNOSIS — D649 Anemia, unspecified: Secondary | ICD-10-CM

## 2017-08-21 DIAGNOSIS — I371 Nonrheumatic pulmonary valve insufficiency: Secondary | ICD-10-CM

## 2017-08-21 LAB — PREALBUMIN: Prealbumin: 11.5 mg/dL — ABNORMAL LOW (ref 18–38)

## 2017-08-21 LAB — BPAM RBC
BLOOD PRODUCT EXPIRATION DATE: 201809272359
Blood Product Expiration Date: 201809272359
ISSUE DATE / TIME: 201808250532
ISSUE DATE / TIME: 201808250010
Unit Type and Rh: 5100
Unit Type and Rh: 5100

## 2017-08-21 LAB — BASIC METABOLIC PANEL
ANION GAP: 6 (ref 5–15)
BUN: 48 mg/dL — AB (ref 6–20)
CHLORIDE: 104 mmol/L (ref 101–111)
CO2: 29 mmol/L (ref 22–32)
Calcium: 9.3 mg/dL (ref 8.9–10.3)
Creatinine, Ser: 1.34 mg/dL — ABNORMAL HIGH (ref 0.44–1.00)
GFR calc Af Amer: 41 mL/min — ABNORMAL LOW (ref >60)
GFR calc non Af Amer: 35 mL/min — ABNORMAL LOW (ref >60)
GLUCOSE: 176 mg/dL — AB (ref 65–99)
POTASSIUM: 4.9 mmol/L (ref 3.5–5.1)
Sodium: 139 mmol/L (ref 135–145)

## 2017-08-21 LAB — TYPE AND SCREEN
ABO/RH(D): O POS
ANTIBODY SCREEN: NEGATIVE
UNIT DIVISION: 0
UNIT DIVISION: 0

## 2017-08-21 LAB — ECHOCARDIOGRAM COMPLETE
Height: 64 in
Weight: 2024.7 oz

## 2017-08-21 MED ORDER — FUROSEMIDE 40 MG PO TABS: 40.0000 mg | ORAL_TABLET | Freq: Two times a day (BID) | ORAL | 0 refills | Status: DC

## 2017-08-21 NOTE — Discharge Summary (Signed)
Physician Discharge Summary  Valerie Downs GHW:[id number] DOB: August 06, 1930 DOA: 08/19/2017  PCP: Golden Circle, FNP  Admit date: 08/19/2017 Discharge date: 08/21/2017  Admitted From: Home Disposition: Home  Recommendations for Outpatient Follow-up:  1. Follow up with PCP in 1 week 2. Please obtain BMP/CBC in one week to recheck creatinine and hemoglobin  Home Health: None Equipment/Devices: None  Discharge Condition: Guarded CODE STATUS: Full code Diet recommendation: Heart healthy   Brief/Interim Summary:  Admission HPI written by Toy Baker, MD   Chief Complaint: Shortness of breath and leg edema  HPI: Valerie Downs is a 83 y.o. female with medical history aortic insufficiency, mitral regurgitation, chronic blood loss anemia, chronic diastolic CHF, hypertension, chronic kidney disease, tobacco abuse  significant of breast cancer (s/p mastectomy 1984) , ovarian mass and enlarging lung mass /LAN     Presented with low extremity swelling and soreness. States she's been taking Lasix at home but doesn't seem to help. She have had some worsening shortness of breath and generalized fatigue this crisis orthopnea denies any fevers or chills some nausea but no vomiting she has persistent Dr. Donivan Scull: stools which she attributes to her drinking boost shakes.  She denies chest pain reports cough for the past 2 weeks that is dry.  NO blood in stool, no falls. Denies any pain.    Regarding pertinent Chronic problems:  Last ECHO 01/02/17  severe LVH, EF 50 to 55%, mod AR, mild MR, severe LA dilation, mod RA dilation, PAS 83 mmHg, small pericardial effusion. Patient has known history of vomiting as declined father testing but also refused hospice or palliative care consult. Patient has known ovarian mass has been getting larger she has refused care numerous occasions. Patient has known chronic GI bleed was admitted in May 2018 and required transfusion 2 units but then refused to have  repeat blood work done. She refused father studies including EGD and a capsule study find agree to CT scan showed progressive enlargement of her ovarian mass. When  palliative care was consulted patient refused to talk to them    Hospital course:  Acute on chronic diastolic heart failure BNP >4500 with dyspnea on admission. Patient responded well to lasix IV. Weight down 2 lbs with net 1.3L out. Back to baseline per patient. Echocardiogram stable with EF of 55-60% and grade 2 diastolic dysfunction. Patient will be discharged on increase dose of lasix 40mg  BID.  Anemia Patient received two units of PRBC and improved from 6.0 to 9.3. She will need repeat CBC as an outpatient. Physical therapy consulted and recommended no continued PT services.  Hyperkalemia Resolved overnight.  Malnutrition, moderate Continued supplement  Pulmonary hypertension Moderate per echocardiogram. Outpatient cardiology follow-up.  Ovarian mass Left. Likely malignancy but patient does not want workup. Outpatient follow-up. Patient continues to decline palliative care consult and reaffirms desire for full code  CKD stage 3 Stable.   Discharge Diagnoses:  Active Problems:   HYPERTENSION, BENIGN SYSTEMIC   Malnutrition of moderate degree (HCC)   Iron deficiency anemia   Ovarian mass, left   Anemia   Moderate to severe pulmonary hypertension (HCC)   Hyperkalemia   Acute on chronic diastolic CHF (congestive heart failure) [facility])    Discharge Instructions  Discharge Instructions    Call MD for:  difficulty breathing, headache or visual disturbances    Complete by:  As directed    Call MD for:  extreme fatigue    Complete by:  As directed    Call  MD for:  persistant dizziness or light-headedness    Complete by:  As directed    Diet - low sodium heart healthy    Complete by:  As directed    Increase activity slowly    Complete by:  As directed      Allergies as of 08/21/2017      Reactions    Penicillins Anaphylaxis, Swelling   Has patient had a PCN reaction causing immediate rash, facial/tongue/throat swelling, SOB or lightheadedness with hypotension: Yes Has patient had a PCN reaction causing severe rash involving mucus membranes or skin necrosis: Yes Has patient had a PCN reaction that required hospitalization Yes Has patient had a PCN reaction occurring within the last 10 years: No If all of the above answers are "NO", then may proceed with Cephalosporin use.      Medication List    STOP taking these medications   amLODipine 2.5 MG tablet Commonly known as:  NORVASC   pantoprazole 40 MG tablet Commonly known as:  PROTONIX     TAKE these medications   aspirin 81 MG EC tablet Take 81 mg by mouth daily as needed for pain. Swallow whole.   chlorhexidine 0.12 % solution Commonly known as:  PERIDEX Rinse with 1/2 ounce by mouth for 30 seconds then spit out. use twice a day   feeding supplement Liqd Take 1 Container by mouth 2 (two) times daily between meals.   feeding supplement (ENSURE ENLIVE) Liqd Take 237 mLs by mouth daily.   furosemide 40 MG tablet Commonly known as:  LASIX Take 1 tablet (40 mg total) by mouth 2 (two) times daily. What changed:  when to take this   HYDROcodone-acetaminophen 5-325 MG tablet Commonly known as:  NORCO/VICODIN Take 1 tablet by mouth every 6 hours if needed for pain   metoprolol tartrate 25 MG tablet Commonly known as:  LOPRESSOR Take 1 tablet (25 mg total) by mouth 2 (two) times daily.            Discharge Care Instructions        Start     Ordered   08/21/17 0000  furosemide (LASIX) 40 MG tablet  2 times daily     08/21/17 1224   08/21/17 0000  Increase activity slowly     08/21/17 1224   08/21/17 0000  Diet - low sodium heart healthy     08/21/17 1224   08/21/17 0000  Call MD for:  persistant dizziness or light-headedness     08/21/17 1224   08/21/17 0000  Call MD for:  extreme fatigue     08/21/17 1224    08/21/17 0000  Call MD for:  difficulty breathing, headache or visual disturbances     08/21/17 1224     Follow-up Information    Golden Circle, FNP. Schedule an appointment as soon as possible for a visit in 1 week(s).   Specialty:  Family Medicine Contact information: [place] [address] [phone]        Burnell Blanks, MD. Schedule an appointment as soon as possible for a visit in 1 week(s).   Specialty:  Cardiology Why:  Heart failure exacerbation Contact information: [place] [place] [address] [phone]          Allergies  Allergen Reactions  . Penicillins Anaphylaxis and Swelling    Has patient had a PCN reaction causing immediate rash, facial/tongue/throat swelling, SOB or lightheadedness with hypotension: Yes Has patient had  a PCN reaction causing severe rash involving mucus membranes or skin necrosis: Yes Has patient had a PCN reaction that required hospitalization Yes Has patient had a PCN reaction occurring within the last 10 years: No If all of the above answers are "NO", then may proceed with Cephalosporin use.     Consultations:  None   Procedures/Studies: Dg Chest 2 View  Result Date: 08/19/2017 CLINICAL DATA:  Shortness of breath, leg swelling, CHF. EXAM: CHEST  2 VIEW COMPARISON:  Radiograph 05/19/2017 FINDINGS: Chronic cardiomegaly tortuosity of the thoracic aorta. Small bilateral pleural effusions which have increased from prior exam. Increased vascular congestion. Peribronchial cuffing suspicious for early pulmonary edema. No confluent airspace disease. No pneumothorax. Surgical clips in the left axilla. Broad-based rightward curvature of the thoracic spine. IMPRESSION: Mild CHF with progressive vascular congestion, small pleural effusions and mild pulmonary edema. Electronically Signed   By: Jeb Levering M.D.   On: 08/19/2017 22:12      Subjective: No chest pain or  dyspnea.  Discharge Exam: Vitals:   08/21/17 0637 08/21/17 1155  BP: (!) 139/51   Pulse: 72 (!) 101  Resp: 18   Temp: 97.7 F (36.5 C)   SpO2: 100% 93%   Vitals:   08/20/17 1345 08/20/17 2057 08/21/17 0637 08/21/17 1155  BP: (!) 145/74 132/70 (!) 139/51   Pulse: 86 86 72 (!) 101  Resp: 18 16 18    Temp: 97.6 F (36.4 C) 98.6 F (37 C) 97.7 F (36.5 C)   TempSrc: Axillary Oral Oral   SpO2: 100% 100% 100% 93%  Weight:   57.4 kg (126 lb 8.7 oz)   Height:        General: Pt is alert, awake, not in acute distress Cardiovascular: RRR, S1/S2 +, no rubs, no gallops Respiratory: CTA bilaterally, no wheezing, no rhonchi Abdominal: Soft, NT, ND, bowel sounds present Extremities: 2+ edema, no cyanosis    The results of significant diagnostics from this hospitalization (including imaging, microbiology, ancillary and laboratory) are listed below for reference.     Microbiology: No results found for this or any previous visit (from the past 240 hour(s)).   Labs: BNP (last 3 results)  Recent Labs  01/01/17 1551 05/19/17 1621 08/19/17 2014  BNP 2,298.0* 4,112.4* >2,440.1*   Basic Metabolic Panel:  Recent Labs Lab 08/19/17 2014 08/20/17 1105 08/21/17 0509  NA 136 138 139  K 5.2* 5.0 4.9  CL 104 105 104  CO2 27 27 29   GLUCOSE 101* 119* 176*  BUN 39* 44* 48*  CREATININE 1.35* 1.41* 1.34*  CALCIUM 9.0 9.1 9.3  MG  --  2.0  --   PHOS  --  4.4  --    Liver Function Tests:  Recent Labs Lab 08/19/17 2014 08/20/17 1105  AST 23 28  ALT 14 15  ALKPHOS 98 97  BILITOT 0.7 1.0  PROT 7.1 7.4  ALBUMIN 2.4* 2.6*   No results for input(s): LIPASE, AMYLASE in the last 168 hours. No results for input(s): AMMONIA in the last 168 hours. CBC:  Recent Labs Lab 08/19/17 2014 08/20/17 1105  WBC 8.0 10.1  NEUTROABS 5.1  --   HGB 6.0* 9.3*  HCT 21.0* 31.3*  MCV 82.7 85.1  PLT 286 242   Cardiac Enzymes: No results for input(s): CKTOTAL, CKMB, CKMBINDEX, TROPONINI in  the last 168 hours. BNP: Invalid input(s): POCBNP CBG: No results for input(s): GLUCAP in the last 168 hours. D-Dimer No results for input(s): DDIMER in the last 72 hours.  Hgb A1c No results for input(s): HGBA1C in the last 72 hours. Lipid Profile No results for input(s): CHOL, HDL, LDLCALC, TRIG, CHOLHDL, LDLDIRECT in the last 72 hours. Thyroid function studies  Recent Labs  08/20/17 1105  TSH 0.916   Anemia work up No results for input(s): VITAMINB12, FOLATE, FERRITIN, TIBC, IRON, RETICCTPCT in the last 72 hours. Urinalysis    Component Value Date/Time   COLORURINE YELLOW 08/19/2017 2352   APPEARANCEUR CLEAR 08/19/2017 2352   LABSPEC 1.010 08/19/2017 2352   PHURINE 6.0 08/19/2017 2352   GLUCOSEU NEGATIVE 08/19/2017 2352   HGBUR NEGATIVE 08/19/2017 2352   BILIRUBINUR NEGATIVE 08/19/2017 2352   [place] 08/19/2017 2352   PROTEINUR 30 (A) 08/19/2017 2352   UROBILINOGEN 0.2 08/16/2014 0535   NITRITE NEGATIVE 08/19/2017 2352   LEUKOCYTESUR SMALL (A) 08/19/2017 2352     SIGNED:   Cordelia Poche, MD [facility] 08/21/2017, 12:25 PM Pager [phone]  If 7PM-7AM, please contact night-coverage [email or web address] Password [email or web address]

## 2017-08-21 NOTE — Evaluation (Signed)
Physical Therapy Evaluation Patient Details Name: Valerie Downs MRN: [id number] DOB: [birth year] Today's Date: 08/21/2017   History of Present Illness   83 y.o. female with medical history aortic insufficiency, mitral regurgitation, chronic blood loss anemia, chronic diastolic CHF, hypertension, chronic kidney disease, tobacco abuse  significant of breast cancer (s/p mastectomy 1984) , ovarian mass and enlarging lung mass /LAN  admitted wtih LE edema and dyspnea. Dx of acute on chronic CHF.  Clinical Impression  Pt is at baseline of  modified independence with mobility, she ambulated 120' with a RW with no loss of balance.  HR 101, SaO2 93% on RA with walking. No further PT indicated, will sign off.     Follow Up Recommendations No PT follow up    Equipment Recommendations  None recommended by PT    Recommendations for Other Services       Precautions / Restrictions Precautions Precaution Comments: 1 fall a few days ago (tripped in dark room), no other falls in past 1 year Restrictions Weight Bearing Restrictions: No      Mobility  Bed Mobility Overal bed mobility: Modified Independent Bed Mobility: Supine to Sit     Supine to sit: Modified independent (Device/Increase time);HOB elevated     General bed mobility comments: used rail  Transfers Overall transfer level: Needs assistance Equipment used: Rolling walker (2 wheeled) Transfers: Sit to/from Stand Sit to Stand: Modified independent (Device/Increase time)            Ambulation/Gait Ambulation/Gait assistance: Modified independent (Device/Increase time) Ambulation Distance (Feet): 120 Feet Assistive device: Rolling walker (2 wheeled) Gait Pattern/deviations: WFL(Within Functional Limits)   Gait velocity interpretation: at or above normal speed for age/gender General Gait Details: steady, no LOB, HR 101, no dyspnea, SaO2 93% RA  Stairs            Wheelchair Mobility    Modified Rankin (Stroke  Patients Only)       Balance Overall balance assessment: Modified Independent                                           Pertinent Vitals/Pain Pain Assessment: No/denies pain    Home Living Family/patient expects to be discharged to:: Private residence Living Arrangements: Children Available Help at Discharge: Family;Available 24 hours/day Type of Home: House Home Access: Stairs to enter Entrance Stairs-Rails: None Entrance Stairs-Number of Steps: 6   Home Equipment: Walker - 2 wheels;Walker - 4 wheels;Cane - single point      Prior Function Level of Independence: Needs assistance   Gait / Transfers Assistance Needed: assistance for stairs, supervision bathing/dressing, walks independently with RW     Comments: uses RW     Hand Dominance        Extremity/Trunk Assessment   Upper Extremity Assessment Upper Extremity Assessment: Overall WFL for tasks assessed    Lower Extremity Assessment Lower Extremity Assessment: Overall WFL for tasks assessed    Cervical / Trunk Assessment Cervical / Trunk Assessment: Kyphotic  Communication   Communication: No difficulties  Cognition Arousal/Alertness: Awake/alert Behavior During Therapy: WFL for tasks assessed/performed Overall Cognitive Status: Within Functional Limits for tasks assessed                                        General Comments  Exercises     Assessment/Plan    PT Assessment Patent does not need any further PT services  PT Problem List         PT Treatment Interventions      PT Goals (Current goals can be found in the Care Plan section)  Acute Rehab PT Goals Patient Stated Goal: likes to work in yard PT Goal Formulation: All assessment and education complete, DC therapy    Frequency     Barriers to discharge        Co-evaluation               AM-PAC PT "6 Clicks" Daily Activity  Outcome Measure Difficulty turning over in bed (including  adjusting bedclothes, sheets and blankets)?: None Difficulty moving from lying on back to sitting on the side of the bed? : None Difficulty sitting down on and standing up from a chair with arms (e.g., wheelchair, bedside commode, etc,.)?: None Help needed moving to and from a bed to chair (including a wheelchair)?: None Help needed walking in hospital room?: None Help needed climbing 3-5 steps with a railing? : A Little 6 Click Score: 23    End of Session Equipment Utilized During Treatment: Gait belt Activity Tolerance: Patient tolerated treatment well Patient left: in chair;with call bell/phone within reach;with family/visitor present Nurse Communication: Mobility status      Time: 1131-1149 PT Time Calculation (min) (ACUTE ONLY): 18 min   Charges:   PT Evaluation $PT Eval Low Complexity: 1 Low     PT G CodesPhilomena Doheny 08/21/2017, 11:59 AM [phone]

## 2017-08-21 NOTE — Progress Notes (Signed)
  Echocardiogram 2D Echocardiogram has been performed.  Tresa Res 08/21/2017, 9:53 AM

## 2017-08-21 NOTE — Discharge Instructions (Signed)
Heart Failure °Heart failure is a condition in which the heart has trouble pumping blood because it has become weak or stiff. This means that the heart does not pump blood efficiently for the body to work well. For some people with heart failure, fluid may back up into the lungs and there may be swelling (edema) in the lower legs. Heart failure is usually a long-term (chronic) condition. It is important for you to take good care of yourself and follow the treatment plan from your health care provider. °What are the causes? °This condition is caused by some health problems, including: °· High blood pressure (hypertension). Hypertension causes the heart muscle to work harder than normal. High blood pressure eventually causes the heart to become stiff and weak. °· Coronary artery disease (CAD). CAD is the buildup of cholesterol and fat (plaques) in the arteries of the heart. °· Heart attack (myocardial infarction). Injured tissue, which is caused by the heart attack, does not contract as well and the heart's ability to pump blood is weakened. °· Abnormal heart valves. When the heart valves do not open and close properly, the heart muscle must pump harder to keep the blood flowing. °· Heart muscle disease (cardiomyopathy or myocarditis). Heart muscle disease is damage to the heart muscle from a variety of causes, such as drug or alcohol abuse, infections, or unknown causes. These can increase the risk of heart failure. °· Lung disease. When the lungs do not work properly, the heart must work harder. ° °What increases the risk? °Risk of heart failure increases as a person ages. This condition is also more likely to develop in people who: °· Are overweight. °· Are female. °· Smoke or chew tobacco. °· Abuse alcohol or illegal drugs. °· Have taken medicines that can damage the heart, such as chemotherapy drugs. °· Have diabetes. °? High blood sugar (glucose) is associated with high fat (lipid) levels in the blood. °? Diabetes  can also damage tiny blood vessels that carry nutrients to the heart muscle. °· Have abnormal heart rhythms. °· Have thyroid problems. °· Have low blood counts (anemia). ° °What are the signs or symptoms? °Symptoms of this condition include: °· Shortness of breath with activity, such as when climbing stairs. °· Persistent cough. °· Swelling of the feet, ankles, legs, or abdomen. °· Unexplained weight gain. °· Difficulty breathing when lying flat (orthopnea). °· Waking from sleep because of the need to sit up and get more air. °· Rapid heartbeat. °· Fatigue and loss of energy. °· Feeling light-headed, dizzy, or close to fainting. °· Loss of appetite. °· Nausea. °· Increased urination during the night (nocturia). °· Confusion. ° °How is this diagnosed? °This condition is diagnosed based on: °· Medical history, symptoms, and a physical exam. °· Diagnostic tests, which may include: °? Echocardiogram. °? Electrocardiogram (ECG). °? Chest X-ray. °? Blood tests. °? Exercise stress test. °? Radionuclide scans. °? Cardiac catheterization and angiogram. ° °How is this treated? °Treatment for this condition is aimed at managing the symptoms of heart failure. Medicines, behavioral changes, or other treatments may be necessary to treat heart failure. °Medicines °These may include: °· Angiotensin-converting enzyme (ACE) inhibitors. This type of medicine blocks the effects of a blood protein called angiotensin-converting enzyme. ACE inhibitors relax (dilate) the blood vessels and help to lower blood pressure. °· Angiotensin receptor blockers (ARBs). This type of medicine blocks the actions of a blood protein called angiotensin. ARBs dilate the blood vessels and help to lower blood pressure. °· Water   pills (diuretics). Diuretics cause the kidneys to remove salt and water from the blood. The extra fluid is removed through urination, leaving a lower volume of blood that the heart has to pump. °· Beta blockers. These improve heart  muscle strength and they prevent the heart from beating too quickly. °· Digoxin. This increases the force of the heartbeat. ° °Healthy behavior changes °These may include: °· Reaching and maintaining a healthy weight. °· Stopping smoking or chewing tobacco. °· Eating heart-healthy foods. °· Limiting or avoiding alcohol. °· Stopping use of street drugs (illegal drugs). °· Physical activity. ° °Other treatments °These may include: °· Surgery to open blocked coronary arteries or repair damaged heart valves. °· Placement of a biventricular pacemaker to improve heart muscle function (cardiac resynchronization therapy). This device paces both the right ventricle and left ventricle. °· Placement of a device to treat serious abnormal heart rhythms (implantable cardioverter defibrillator, or ICD). °· Placement of a device to improve the pumping ability of the heart (left ventricular assist device, or LVAD). °· Heart transplant. This can cure heart failure, and it is considered for certain patients who do not improve with other therapies. ° °Follow these instructions at home: °Medicines °· Take over-the-counter and prescription medicines only as told by your health care provider. Medicines are important in reducing the workload of your heart, slowing the progression of heart failure, and improving your symptoms. °? Do not stop taking your medicine unless your health care provider told you to do that. °? Do not skip any dose of medicine. °? Refill your prescriptions before you run out of medicine. You need your medicines every day. °Eating and drinking ° °· Eat heart-healthy foods. Talk with a dietitian to make an eating plan that is right for you. °? Choose foods that contain no trans fat and are low in saturated fat and cholesterol. Healthy choices include fresh or frozen fruits and vegetables, fish, lean meats, legumes, fat-free or low-fat dairy products, and whole-grain or high-fiber foods. °? Limit salt (sodium) if  directed by your health care provider. Sodium restriction may reduce symptoms of heart failure. Ask a dietitian to recommend heart-healthy seasonings. °? Use healthy cooking methods instead of frying. Healthy methods include roasting, grilling, broiling, baking, poaching, steaming, and stir-frying. °· Limit your fluid intake if directed by your health care provider. Fluid restriction may reduce symptoms of heart failure. °Lifestyle °· Stop smoking or using chewing tobacco. Nicotine and tobacco can damage your heart and your blood vessels. Do not use nicotine gum or patches before talking to your health care provider. °· Limit alcohol intake to no more than 1 drink per day for non-pregnant women and 2 drinks per day for men. One drink equals 12 oz of beer, 5 oz of wine, or 1½ oz of hard liquor. °? Drinking more than that is harmful to your heart. Tell your health care provider if you drink alcohol several times a week. °? Talk with your health care provider about whether any level of alcohol use is safe for you. °? If your heart has already been damaged by alcohol or you have severe heart failure, drinking alcohol should be stopped completely. °· Stop use of illegal drugs. °· Lose weight if directed by your health care provider. Weight loss may reduce symptoms of heart failure. °· Do moderate physical activity if directed by your health care provider. People who are elderly and people with severe heart failure should consult with a health care provider for physical activity recommendations. °  Monitor important information °· Weigh yourself every day. Keeping track of your weight daily helps you to notice excess fluid sooner. °? Weigh yourself every morning after you urinate and before you eat breakfast. °? Wear the same amount of clothing each time you weigh yourself. °? Record your daily weight. Provide your health care provider with your weight record. °· Monitor and record your blood pressure as told by your health  care provider. °· Check your pulse as told by your health care provider. °Dealing with extreme temperatures °· If the weather is extremely hot: °? Avoid vigorous physical activity. °? Use air conditioning or fans or seek a cooler location. °? Avoid caffeine and alcohol. °? Wear loose-fitting, lightweight, and light-colored clothing. °· If the weather is extremely cold: °? Avoid vigorous physical activity. °? Layer your clothes. °? Wear mittens or gloves, a hat, and a scarf when you go outside. °? Avoid alcohol. °General instructions °· Manage other health conditions such as hypertension, diabetes, thyroid disease, or abnormal heart rhythms as told by your health care provider. °· Learn to manage stress. If you need help to do this, ask your health care provider. °· Plan rest periods when fatigued. °· Get ongoing education and support as needed. °· Participate in or seek rehabilitation as needed to maintain or improve independence and quality of life. °· Stay up to date with immunizations. Keeping current on pneumococcal and influenza immunizations is especially important to prevent respiratory infections. °· Keep all follow-up visits as told by your health care provider. This is important. °Contact a health care provider if: °· You have a rapid weight gain. °· You have increasing shortness of breath that is unusual for you. °· You are unable to participate in your usual physical activities. °· You tire easily. °· You cough more than normal, especially with physical activity. °· You have any swelling or more swelling in areas such as your hands, feet, ankles, or abdomen. °· You are unable to sleep because it is hard to breathe. °· You feel like your heart is beating quickly (palpitations). °· You become dizzy or light-headed when you stand up. °Get help right away if: °· You have difficulty breathing. °· You notice or your family notices a change in your awareness, such as having trouble staying awake or having  difficulty with concentration. °· You have pain or discomfort in your chest. °· You have an episode of fainting (syncope). °This information is not intended to replace advice given to you by your health care provider. Make sure you discuss any questions you have with your health care provider. °Document Released: 12/13/2005 Document Revised: 08/17/2016 Document Reviewed: 07/07/2016 °Elsevier Interactive Patient Education © 2017 [employer] ° °

## 2017-08-22 ENCOUNTER — Telehealth: Payer: Self-pay | Admitting: *Deleted

## 2017-08-22 NOTE — Telephone Encounter (Signed)
Tried calling pt to set-up TCM appt no answer & can't leave msg due to vm not set-up. Will retry later...Valerie Downs

## 2017-08-24 NOTE — Telephone Encounter (Signed)
Called pt to f/u on hosp appt that needed to be made. Pt states her daughter had already called back and made appt for this Friday. Verified appt set for 08/26/17 inform pt also need to ask some additional questions concerning hosp stay. Completed TCM call below.../lmb  Transition Care Management Follow-up Telephone Call   Date discharged? 08/21/17   How have you been since you were released from the hospital? Pt states she seem to be doing ok   Do you understand why you were in the hospital? YES   Do you understand the discharge instructions? YES   Where were you discharged to? Home   Items Reviewed:  Medications reviewed: YES  Allergies reviewed: YES  Dietary changes reviewed: NO  Referrals reviewed: No referral needed   Functional Questionnaire:   Activities of Daily Living (ADLs):   She states she are independent in the following: bathing and hygiene, feeding, grooming and toileting States she require assistance with the following: ambulation, continence and dressing   Any transportation issues/concerns?: YES   Any patient concerns? YES   Confirmed importance and date/time of follow-up visits scheduled YES, appt 08/26/17  Provider Appointment booked with Terri Piedra, NP  Confirmed with patient if condition begins to worsen call PCP or go to the ER.  Patient was given the office number and encouraged to call back with question or concerns.  : YES

## 2017-08-26 ENCOUNTER — Encounter: Payer: Self-pay | Admitting: Family

## 2017-08-26 ENCOUNTER — Other Ambulatory Visit (INDEPENDENT_AMBULATORY_CARE_PROVIDER_SITE_OTHER): Payer: Medicare Other

## 2017-08-26 ENCOUNTER — Ambulatory Visit (INDEPENDENT_AMBULATORY_CARE_PROVIDER_SITE_OTHER): Payer: Medicare Other | Admitting: Family

## 2017-08-26 VITALS — BP 140/64 | HR 87 | Temp 98.3°F | Resp 16 | Ht 64.0 in | Wt 125.0 lb

## 2017-08-26 DIAGNOSIS — I5032 Chronic diastolic (congestive) heart failure: Secondary | ICD-10-CM

## 2017-08-26 DIAGNOSIS — D508 Other iron deficiency anemias: Secondary | ICD-10-CM | POA: Diagnosis not present

## 2017-08-26 LAB — CBC
HEMATOCRIT: 28.3 % — AB (ref 36.0–46.0)
MCHC: 30.1 g/dL (ref 30.0–36.0)
MCV: 85.3 fl (ref 78.0–100.0)
Platelets: 216 10*3/uL (ref 150.0–400.0)
RBC: 3.32 Mil/uL — ABNORMAL LOW (ref 3.87–5.11)
RDW: 20 % — AB (ref 11.5–15.5)
WBC: 9.7 10*3/uL (ref 4.0–10.5)

## 2017-08-26 LAB — COMPREHENSIVE METABOLIC PANEL
ALT: 20 U/L (ref 0–35)
AST: 35 U/L (ref 0–37)
Albumin: 2.9 g/dL — ABNORMAL LOW (ref 3.5–5.2)
Alkaline Phosphatase: 117 U/L (ref 39–117)
BUN: 40 mg/dL — AB (ref 6–23)
CHLORIDE: 101 meq/L (ref 96–112)
CO2: 30 meq/L (ref 19–32)
Calcium: 9.4 mg/dL (ref 8.4–10.5)
Creatinine, Ser: 1.34 mg/dL — ABNORMAL HIGH (ref 0.40–1.20)
GFR: 48.33 mL/min — ABNORMAL LOW (ref >60.00)
GLUCOSE: 91 mg/dL (ref 70–99)
POTASSIUM: 4.6 meq/L (ref 3.5–5.1)
SODIUM: 137 meq/L (ref 135–145)
Total Bilirubin: 0.8 mg/dL (ref 0.2–1.2)
Total Protein: 7.3 g/dL (ref 6.0–8.3)

## 2017-08-26 NOTE — Progress Notes (Signed)
Subjective:    Patient ID: Valerie Downs, female    DOB: 1930-06-08, 82 y.o.   MRN: [id number]  Chief Complaint  Patient presents with  . Hospitalization Follow-up    fatigue and weak    HPI:  Valerie Downs is a 83 y.o. female who  has a past medical history of Aortic insufficiency; Blood transfusion without reported diagnosis; Breast cancer ([place]); Cardiomyopathy [facility]); CHF (congestive heart failure) ([place]); History of echocardiogram; Hypertension; and Mitral regurgitation. and presents today for a follow up office visit.   Recently had evaluated in the emergency department and admitted to the hospital with the chief complaint of worsening bilateral lower extremity swelling and soreness that was refractory to previously prescribed Lasix. Also noted to have increasing shortness of breath prior to presentation. She was noted to have tachypnea with mildly increased work of breathing and no respiratory distress and crackles noted in the bases. Edema was noted to be 2+ pitting in her bilateral lower extremities to her knees. Hemoglobin was noted to be 6.0. BNP was noted to be greater than 4500. EKG showed sinus rhythm with multiple premature complexes. She was admitted and diagnosed with acute on chronic diastolic heart failure with her weight being down 2 pounds in a net of 1.3 L out following diuresis with furosemide. Echocardiogram showed 55-60% of ejection fraction and grade 2 diastolic dysfunction. She was discharged to decrease dosage of maintenance Lasix. She did receive 2 units of packed red blood cells with a hemoglobin increasing from 6.0-9.3. Physical therapy consult and recommended no continued need for physical therapy services. She was also noted to have a left ovarian mass likely malignant but did not want further workup. All hospital records, labs, and imaging reviewed in detail.  Since leaving the hospital she reports that her weight has remained stable with no additional shortness of  breath. Continues to experience fatigue and weakness as her oral intake is limited secondary to her current dental status. Believes she may have increased swelling in her lower extremities although mild. She continues to ambulate with a walker. Working on her nutritional intake, however and relation to her dental status she finds herself eating softer foods and soups. She does supplement with nutritional boost/Ensure between meals as able. No current calf pain. She is able to complete most of her activities of daily living with minimal assistance.   Allergies  Allergen Reactions  . Penicillins Anaphylaxis and Swelling    Has patient had a PCN reaction causing immediate rash, facial/tongue/throat swelling, SOB or lightheadedness with hypotension: Yes Has patient had a PCN reaction causing severe rash involving mucus membranes or skin necrosis: Yes Has patient had a PCN reaction that required hospitalization Yes Has patient had a PCN reaction occurring within the last 10 years: No If all of the above answers are "NO", then may proceed with Cephalosporin use.       Outpatient Medications Prior to Visit  Medication Sig Dispense Refill  . aspirin 81 MG EC tablet Take 81 mg by mouth daily as needed for pain. Swallow whole.    . chlorhexidine (PERIDEX) 0.12 % solution Rinse with 1/2 ounce by mouth for 30 seconds then spit out. use twice a day  0  . feeding supplement (BOOST / RESOURCE BREEZE) LIQD Take 1 Container by mouth 2 (two) times daily between meals. 10 Container 0  . feeding supplement, ENSURE ENLIVE, (ENSURE ENLIVE) LIQD Take 237 mLs by mouth daily. 237 mL 12  . furosemide (LASIX) 40  MG tablet Take 1 tablet (40 mg total) by mouth 2 (two) times daily. 60 tablet 0  . HYDROcodone-acetaminophen (NORCO/VICODIN) 5-325 MG tablet Take 1 tablet by mouth every 6 hours if needed for pain  0  . metoprolol tartrate (LOPRESSOR) 25 MG tablet Take 1 tablet (25 mg total) by mouth 2 (two) times daily. 180  tablet 3   No facility-administered medications prior to visit.       Past Surgical History:  Procedure Laterality Date  . COLONOSCOPY N/A 06/22/2014   Procedure: COLONOSCOPY;  Surgeon: Jerene Bears, MD;  Location: [facility];  Service: Endoscopy;  Laterality: N/A;  . ENDOBRONCHIAL ULTRASOUND N/A 12/31/2016   Procedure: ENDOBRONCHIAL ULTRASOUND;  Surgeon: Juanito Doom, MD;  Location: [facility];  Service: Cardiopulmonary;  Laterality: N/A;  . ESOPHAGOGASTRODUODENOSCOPY N/A 06/22/2014   Procedure: ESOPHAGOGASTRODUODENOSCOPY (EGD);  Surgeon: Jerene Bears, MD;  Location: [facility];  Service: Endoscopy;  Laterality: N/A;  . MASTECTOMY Left       Past Medical History:  Diagnosis Date  . Aortic insufficiency   . Blood transfusion without reported diagnosis   . Breast cancer ([place])   . Cardiomyopathy ([place])   . CHF (congestive heart failure) ([place])   . History of echocardiogram    Echo 4/18: severe focal basal and mod conc LVH, EF 50-55, no RWMA, Gr 2 DD, mod AI, severe MR, mild LAE, mildly reduced RVSF, severe TR, mod PI, PASP 65, mild pericardial eff  . Hypertension   . Mitral regurgitation       Review of Systems  Constitutional: Positive for fatigue. Negative for activity change, appetite change, chills, diaphoresis, fever and unexpected weight change.  HENT: Negative for congestion.   Respiratory: Negative for chest tightness, shortness of breath and wheezing.   Cardiovascular: Positive for leg swelling. Negative for chest pain and palpitations.  Neurological: Positive for weakness. Negative for dizziness and light-headedness.      Objective:    BP 140/64 (BP Location: Left Arm, Patient Position: Sitting, Cuff Size: Normal)   Pulse 87   Temp 98.3 F (36.8 C) (Oral)   Resp 16   Ht 5' 4"  (1.626 m)   Wt 125 lb (56.7 kg)   SpO2 96%   BMI 21.46 kg/m  Nursing note and vital signs reviewed.  Physical Exam  Constitutional: She is oriented to person, place, and  time. She appears well-developed and well-nourished. No distress.  Frail appearing with walker in front of her.   Cardiovascular: Normal rate, regular rhythm and intact distal pulses.  Exam reveals no gallop and no friction rub.   Murmur heard. Pulmonary/Chest: Effort normal and breath sounds normal. No respiratory distress. She has no wheezes. She has no rales. She exhibits no tenderness.  Abdominal: Soft. Bowel sounds are normal. She exhibits no distension and no mass. There is no tenderness. There is no rebound and no guarding.  Neurological: She is alert and oriented to person, place, and time.  Skin: Skin is warm and dry.  Psychiatric: She has a normal mood and affect. Her behavior is normal. Judgment and thought content normal.       Assessment & Plan:   Problem List Items Addressed This Visit      Cardiovascular and Mediastinum   Chronic diastolic CHF (congestive heart failure) (HCC) - Primary    Chronic diastolic heart failure appears stable with current medication regimen and no adverse side effects. Weight has remained stable with no further shortness of breath and minimal lower extremity edema.  Continue current dosage of furosemide. Continue to monitor weight at home.      Relevant Orders   Comp Met (CMET) (Completed)   CBC (Completed)     Other   Anemia    Anemia with no significant bleeding. Obtain CBC and complete metabolic profile. Continue to monitor pending CBC results.          I am having Ms. Ewalt maintain her metoprolol tartrate, feeding supplement, feeding supplement (ENSURE ENLIVE), HYDROcodone-acetaminophen, chlorhexidine, aspirin, and furosemide.   Follow-up: Return in about 2 months (around 10/26/2017), or if symptoms worsen or fail to improve.  Mauricio Po, FNP

## 2017-08-26 NOTE — Assessment & Plan Note (Signed)
Chronic diastolic heart failure appears stable with current medication regimen and no adverse side effects. Weight has remained stable with no further shortness of breath and minimal lower extremity edema. Continue current dosage of furosemide. Continue to monitor weight at home.

## 2017-08-26 NOTE — Assessment & Plan Note (Signed)
Anemia with no significant bleeding. Obtain CBC and complete metabolic profile. Continue to monitor pending CBC results.

## 2017-08-26 NOTE — Patient Instructions (Addendum)
Thank you for choosing [facility].  SUMMARY AND INSTRUCTIONS:  Please continue to take your medication as prescribed.  Continue with the Boost and Ensure.  Work on deep breathing and moving around.   We will check your white/red blood cells and kidney function, and electrolytes.   Labs:  Please stop by the lab on the lower level of the building for your blood work. Your results will be released to [place] (or called to you) after review, usually within 72 hours after test completion. If any changes need to be made, you will be notified at that same time.  1.) The lab is open from 7:30am to 5:30 pm Monday-Friday 2.) No appointment is necessary 3.) Fasting (if needed) is 6-8 hours after food and drink; black coffee and water are okay    Follow up:  If your symptoms worsen or fail to improve, please contact our office for further instruction, or in case of emergency go directly to the emergency room at the closest medical facility.

## 2017-08-29 ENCOUNTER — Other Ambulatory Visit: Payer: Self-pay | Admitting: Family

## 2017-08-29 DIAGNOSIS — D649 Anemia, unspecified: Secondary | ICD-10-CM

## 2017-08-30 ENCOUNTER — Encounter: Payer: Self-pay | Admitting: Physician Assistant

## 2017-08-30 ENCOUNTER — Ambulatory Visit (INDEPENDENT_AMBULATORY_CARE_PROVIDER_SITE_OTHER): Payer: Medicare Other | Admitting: Physician Assistant

## 2017-08-30 VITALS — BP 130/62 | HR 88 | Ht 64.0 in | Wt 127.4 lb

## 2017-08-30 DIAGNOSIS — N839 Noninflammatory disorder of ovary, fallopian tube and broad ligament, unspecified: Secondary | ICD-10-CM | POA: Diagnosis not present

## 2017-08-30 DIAGNOSIS — I272 Pulmonary hypertension, unspecified: Secondary | ICD-10-CM

## 2017-08-30 DIAGNOSIS — N838 Other noninflammatory disorders of ovary, fallopian tube and broad ligament: Secondary | ICD-10-CM

## 2017-08-30 DIAGNOSIS — F17210 Nicotine dependence, cigarettes, uncomplicated: Secondary | ICD-10-CM | POA: Diagnosis not present

## 2017-08-30 DIAGNOSIS — I10 Essential (primary) hypertension: Secondary | ICD-10-CM | POA: Diagnosis not present

## 2017-08-30 DIAGNOSIS — I5032 Chronic diastolic (congestive) heart failure: Secondary | ICD-10-CM | POA: Diagnosis not present

## 2017-08-30 MED ORDER — FUROSEMIDE 40 MG PO TABS
80.0000 mg | ORAL_TABLET | Freq: Every day | ORAL | 0 refills | Status: DC
Start: 2017-08-30 — End: 2017-10-02

## 2017-08-30 NOTE — Patient Instructions (Signed)
Medication Instructions: Your physician has recommended you make the following change in your medication:  - 1) CHANGE Furosemide (Lasix) - Take 2 tablets (80 mg) by mouth in the morning  Labwork: - None Ordered  Procedures/Testing: - None Ordered  Follow-Up: - Your physician recommends that you schedule a follow-up appointment in 2 MONTHS with Dr. Angelena Form  If you need a refill on your cardiac medications before your next appointment, please call your pharmacy.

## 2017-08-30 NOTE — Progress Notes (Signed)
Cardiology Office Note    Date:  08/30/2017   ID:  Valerie Downs, DOB [birth year], MRN [id number]  PCP:  [place], FNP  Cardiologist: Dr. Angelena Form  Chief Complaint  Patient presents with  . Follow-up    History of Present Illness:  Valerie Downs is a 83 y.o. female with history of hypertrophic cardiomyopathy without obstruction and prior reduced LV function EF 40-45% in 2014. Also has valvular heart disease with moderate AI and moderate MR. Follow-up echo 2015 improved LV function EF 55-60%. Chest pain 06/2016 EF normal on echo, nuclear stress test possible scar but no ischemia EF 40%. Patient was found to have an ovarian mass on CT but was not interested in surgery or chemotherapy. She also had a lung mass and underwent biopsy 09/2016 that was benign.  Patient was admitted 12/1998 1824 hrs. after a lung biopsy and had a hemoglobin of 6.9 requiring transfusions. BNP was significantly elevated at 2298. Troponins were flat. She had a large pericardial effusion on CT. Echo demonstrated small pericardial effusion. She refused further workup.  Patient last saw Richardson Dopp, PA-C 04/01/17 after walking into the office complaining of shortness of breath. She did not appear to have volume overload on exam. BNP was 31,000 potassium 6.1. HC TZ was changed to Lasix 40 mg daily. Echo 04/18/17 LVEF 50-55% with moderate AI, severe MR, severe TR, small pericardial effusion. She has severe pulmonary hypertension. Her dyspnea was felt related to combination of chronic lung disease and heart disease.  Saw Dr. Angelena Form 04/20/17 she is not a candidate for MR surgical repair given her dance stage and probable malignancy that she refuses treatment for.  Patient was discharged from the hospital 08/21/17 after an admission with acute on chronic diastolic CHF. BNP greater than 4500. She responded well to IV Lasix. Repeat echo stable EF 55-60% with grade 2 DD. She was discharged on increased Lasix 40 mg twice a day.  Patient continues to refuse workup for likely malignant ovarian mass. She declines palliative care consult and reaffirms her desire for full code.  Patient comes in today alone. She decreased her Lasix to once daily because she is running to the bathroom too much. She is weighing herself every day and gained 2 pounds overnight. Overall she says she is keeping the fluid off. She says she craves ice and eats at all day long.   Past Medical History:  Diagnosis Date  . Aortic insufficiency   . Blood transfusion without reported diagnosis   . Breast cancer ([place])   . Cardiomyopathy ([place])   . CHF (congestive heart failure) ([place])   . History of echocardiogram    Echo 4/18: severe focal basal and mod conc LVH, EF 50-55, no RWMA, Gr 2 DD, mod AI, severe MR, mild LAE, mildly reduced RVSF, severe TR, mod PI, PASP 65, mild pericardial eff  . Hypertension   . Mitral regurgitation     Past Surgical History:  Procedure Laterality Date  . COLONOSCOPY N/A 06/22/2014   Procedure: COLONOSCOPY;  Surgeon: Jerene Bears, MD;  Location: [facility];  Service: Endoscopy;  Laterality: N/A;  . ENDOBRONCHIAL ULTRASOUND N/A 12/31/2016   Procedure: ENDOBRONCHIAL ULTRASOUND;  Surgeon: Juanito Doom, MD;  Location: [facility];  Service: Cardiopulmonary;  Laterality: N/A;  . ESOPHAGOGASTRODUODENOSCOPY N/A 06/22/2014   Procedure: ESOPHAGOGASTRODUODENOSCOPY (EGD);  Surgeon: Jerene Bears, MD;  Location: [facility];  Service: Endoscopy;  Laterality: N/A;  . MASTECTOMY Left     Current Medications: Current  Meds  Medication Sig  . chlorhexidine (PERIDEX) 0.12 % solution Rinse with 1/2 ounce by mouth for 30 seconds then spit out. use twice a day  . feeding supplement (BOOST / RESOURCE BREEZE) LIQD Take 1 Container by mouth 2 (two) times daily between meals.  . feeding supplement, ENSURE ENLIVE, (ENSURE ENLIVE) LIQD Take 237 mLs by mouth daily.  . furosemide (LASIX) 40 MG tablet Take 2 tablets (80 mg total) by mouth  daily.  Marland Kitchen HYDROcodone-acetaminophen (NORCO/VICODIN) 5-325 MG tablet Take 1 tablet by mouth every 6 hours if needed for pain  . metoprolol tartrate (LOPRESSOR) 25 MG tablet Take 1 tablet (25 mg total) by mouth 2 (two) times daily.  . [DISCONTINUED] aspirin 81 MG EC tablet Take 81 mg by mouth daily as needed for pain. Swallow whole.  . [DISCONTINUED] furosemide (LASIX) 40 MG tablet Take 1 tablet (40 mg total) by mouth 2 (two) times daily.     Allergies:   Penicillins   Social History   Social History  . Marital status: Widowed    Spouse name: N/A  . Number of children: 10  . Years of education: 5   Occupational History  . Retired    Social History Main Topics  . Smoking status: Current Some Day Smoker    Packs/day: 0.25    Years: 50.00    Types: Cigarettes  . Smokeless tobacco: Former Systems developer    Types: Snuff     Comment: per patient has not used snuff in a while (10/18/16)  . Alcohol use No  . Drug use: No  . Sexual activity: No   Other Topics Concern  . None   Social History Narrative   Denies abuse and feels safe at home.      Family History:  The patient's family history includes Cancer in her father; Hypertension in her mother.   ROS:   Please see the history of present illness.    Review of Systems  Constitution: Positive for decreased appetite.  HENT: Negative.   Eyes: Negative.   Cardiovascular: Positive for leg swelling.  Respiratory: Positive for cough and shortness of breath.   Hematologic/Lymphatic: Negative.   Musculoskeletal: Positive for myalgias. Negative for joint pain.  Gastrointestinal: Negative.   Genitourinary: Negative.   Neurological: Positive for loss of balance.   All other systems reviewed and are negative.   PHYSICAL EXAM:   VS:  BP 130/62   Pulse 88   Ht _0  (1.626 m)   Wt 127 lb 6.4 oz (57.8 kg)   SpO2 97%   BMI 21.87 kg/m   Physical Exam  GEN: Thin, elderly, in no acute distress  Neck: Increased JVD, no carotid bruits, or  masses Cardiac:RRR; 4-2/3 for systolic murmur at the left sternal border Respiratory: Decreased breath sounds with rales at the bases left greater than right GI: Distended Ext: Trace of edema bilaterally, decreased distal pulses Neuro:  Alert and Oriented x 3 Psych: euthymic mood, full affect  Wt Readings from Last 3 Encounters:  08/30/17 127 lb 6.4 oz (57.8 kg)  08/26/17 125 lb (56.7 kg)  08/21/17 126 lb 8.7 oz (57.4 kg)      Studies/Labs Reviewed:   EKG:  EKG is not ordered today.   Recent Labs: 04/28/2017: Pro B Natriuretic peptide (BNP) 3,325.0 08/19/2017: B Natriuretic Peptide >4,500.0 08/20/2017: Magnesium 2.0; TSH 0.916 08/26/2017: ALT 20; BUN 40; Creatinine, Ser 1.34; Hemoglobin 8.5 Repeated and verified X2.; Platelets 216.0; Potassium 4.6; Sodium 137   Lipid Panel  Component Value Date/Time   CHOL 101 07/06/2016 1954   TRIG 66 07/06/2016 1954   HDL 39 (L) 07/06/2016 1954   CHOLHDL 2.6 07/06/2016 1954   VLDL 13 07/06/2016 1954   [place] 49 07/06/2016 1954    Additional studies/ records that were reviewed today include:  Echo 8/26/18Study Conclusions   - Left ventricle: The cavity size was normal. There was severe   focal basal and moderate concentric hypertrophy. Systolic   function was normal. The estimated ejection fraction was in the   range of 55% to 60%. Wall motion was normal; there were no   regional wall motion abnormalities. Features are consistent with   a pseudonormal left ventricular filling pattern, with concomitant   abnormal relaxation and increased filling pressure (grade 2   diastolic dysfunction). Doppler parameters are consistent with   high ventricular filling pressure. - Aortic valve: Trileaflet; moderately thickened, mildly calcified   leaflets. There was moderate regurgitation. - Aorta: Ascending aorta diameter: 41 mm (ED). - Ascending aorta: The ascending aorta was mildly dilated. - Mitral valve: Calcified annulus. There was moderate  regurgitation   directed eccentrically and toward the septum. - Left atrium: The atrium was severely dilated. - Right ventricle: The cavity size was mildly dilated. Wall   thickness was normal. Systolic function was mildly reduced. - Right atrium: The atrium was severely dilated. - Pulmonic valve: There was moderate regurgitation. - Pulmonary arteries: PA peak pressure: 60 mm Hg (S). - Pericardium, extracardiac: A small, free-flowing pericardial   effusion was identified circumferential to the heart. The fluid   had no internal echoes.   Impressions:   - The right ventricular systolic pressure was increased consistent   with moderate pulmonary hypertension.   Echo 04/15/17: Left ventricle: The cavity size was normal. There was severe   focal basal and moderate concentric hypertrophy of the left   ventricle. Systolic function was normal. The estimated ejection   fraction was in the range of 50% to 55%. Wall motion was normal;   there were no regional wall motion abnormalities. Features are   consistent with a pseudonormal left ventricular filling pattern,   with concomitant abnormal relaxation and increased filling   pressure (grade 2 diastolic dysfunction). Doppler parameters are   consistent with elevated ventricular end-diastolic filling   pressure. - Aortic valve: There was moderate regurgitation. - Mitral valve: There was severe regurgitation. - Left atrium: The atrium was mildly dilated. - Right ventricle: The cavity size was mildly dilated. Wall   thickness was mildly increased. Systolic function was mildly   reduced. - Tricuspid valve: There was severe regurgitation. - Pulmonic valve: There was moderate regurgitation. - Pulmonary arteries: Systolic pressure was moderately to severely   increased. PA peak pressure: 65 mm Hg (S). - Inferior vena cava: The vessel was dilated. The respirophasic   diameter changes were blunted (< 50%), consistent with elevated   central  venous pressure. - Pericardium, extracardiac: A mild pericardial effusion was   identified posterior to the heart. Features were not consistent   with tamponade physiology. Echo 01/02/17 Severe LVH, EF 50-55, normal wall motion, moderate AI, MAC, mild MR, severe LAE, moderate RAE, PASP 83 (severely increased), small pericardial effusion   Chest CTA 01/01/17 IMPRESSION: 1. No pulmonary emboli. 2. The patient's known malignancy in the medial right lower lobe posterior to the right lower lobe bronchus is larger in the interval. A pre tracheal node is more prominent in the interval. No other change within visualized lymph nodes.  3. New pericardial effusion posteriorly only measuring 2 cm. New small pleural effusions. 4. New small nodule in the left apex. Recommend attention on follow-up. 5. **An incidental finding of potential clinical significance has been found. The ascending thoracic aorta measures 4.7 cm which is stable. Ascending thoracic aortic aneurysm. Recommend semi-annual imaging followup by CTA or MRA and referral to cardiothoracic surgery if not already obtained. This recommendation follows 2010 ACCF/AHA/AATS/ACR/ASA/SCA/SCAI/SIR/STS/SVM Guidelines for the Diagnosis and Management of Patients With Thoracic Aortic Disease. Circulation. 2010; 121: R830-N407**   Echo 07/07/16 Severe focal basal and moderate concentric LVH, EF 60-65, normal wall motion, grade 1 diastolic dysfunction, moderate AI, mildly dilated aortic root (40 mm), MAC, moderate MR, severe LAE, mild TR, PI, PASP within normal range   Myoview 07/07/16 IMPRESSION: 1. No reversible ischemia.  Inferior wall attenuation or scar.  2. Global hypokinesis most marked in the septum.  3. Left ventricular ejection fraction 44%  4. Intermediate-risk stress test findings*. (medical Rx planned)   Echo 08/07/14 Severe basal septal hypertrophy, no SAM LVOT gradient or subvalvular web, EF 55-60, mild to moderate AI, aortic root  appears dilated, MAC, mild MR, mild LAE, PASP 34       ASSESSMENT:    1. Chronic diastolic CHF (congestive heart failure) ([place])   2. Moderate to severe pulmonary hypertension ([place])   3. HYPERTENSION, BENIGN SYSTEMIC   4. Ovarian mass, left   5. Cigarette smoker      PLAN:  In order of problems listed above:  Chronic diastolic CHF with recent hospitalization. Lasix increased to 80 mg daily but she is only taken 40 mg daily. Weight is up 2 pounds and she does have rails. I've asked her to increase her Lasix to 80 mg daily taking it in the morning so she isn't urinating all day long. 2 g sodium diet. Keep track of her weight. Follow-up with Dr. Angelena Form in 2 months. Labs checked by primary care 08/26/17 potassium was normal at 4.6 creatinine stable at 1.34 hemoglobin 8.5  Moderate to severe pulmonary hypertension blood pressure stable  Essential hypertension blood pressure stable  Ovarian mass refuses workup  Tobacco abuse smoking cessation recommended.    Medication Adjustments/Labs and Tests Ordered: Current medicines are reviewed at length with the patient today.  Concerns regarding medicines are outlined above.  Medication changes, Labs and Tests ordered today are listed in the Patient Instructions below. Patient Instructions  Medication Instructions: Your physician has recommended you make the following change in your medication:  - 1) CHANGE Furosemide (Lasix) - Take 2 tablets (80 mg) by mouth in the morning  Labwork: - None Ordered  Procedures/Testing: - None Ordered  Follow-Up: - Your physician recommends that you schedule a follow-up appointment in 2 MONTHS with Dr. Angelena Form  If you need a refill on your cardiac medications before your next appointment, please call your pharmacy.      Sumner Boast, PA-C  08/30/2017 12:55 PM    [facility] [address], [place], [place]  [address] Phone: [phone]; Fax: [phone]

## 2017-09-02 ENCOUNTER — Telehealth: Payer: Self-pay | Admitting: Internal Medicine

## 2017-09-02 NOTE — Telephone Encounter (Signed)
Called and spoke with pt and she is aware that she will need to call her PCP to find out about her results.

## 2017-09-11 ENCOUNTER — Emergency Department (HOSPITAL_COMMUNITY)
Admission: EM | Admit: 2017-09-11 | Discharge: 2017-09-11 | Disposition: A | Discharge status: Home / Self Care | Payer: Medicare Other | Attending: Emergency Medicine | Admitting: Emergency Medicine

## 2017-09-11 ENCOUNTER — Emergency Department (HOSPITAL_COMMUNITY): Payer: Medicare Other

## 2017-09-11 ENCOUNTER — Encounter (HOSPITAL_COMMUNITY): Payer: Self-pay | Admitting: Emergency Medicine

## 2017-09-11 DIAGNOSIS — N390 Urinary tract infection, site not specified: Secondary | ICD-10-CM | POA: Diagnosis not present

## 2017-09-11 DIAGNOSIS — R600 Localized edema: Secondary | ICD-10-CM | POA: Diagnosis not present

## 2017-09-11 DIAGNOSIS — Z853 Personal history of malignant neoplasm of breast: Secondary | ICD-10-CM | POA: Insufficient documentation

## 2017-09-11 DIAGNOSIS — I5033 Acute on chronic diastolic (congestive) heart failure: Secondary | ICD-10-CM | POA: Insufficient documentation

## 2017-09-11 DIAGNOSIS — R112 Nausea with vomiting, unspecified: Secondary | ICD-10-CM | POA: Diagnosis present

## 2017-09-11 DIAGNOSIS — N289 Disorder of kidney and ureter, unspecified: Secondary | ICD-10-CM | POA: Diagnosis not present

## 2017-09-11 DIAGNOSIS — F17210 Nicotine dependence, cigarettes, uncomplicated: Secondary | ICD-10-CM | POA: Diagnosis not present

## 2017-09-11 DIAGNOSIS — R609 Edema, unspecified: Secondary | ICD-10-CM

## 2017-09-11 DIAGNOSIS — I110 Hypertensive heart disease with heart failure: Secondary | ICD-10-CM | POA: Diagnosis not present

## 2017-09-11 DIAGNOSIS — E860 Dehydration: Secondary | ICD-10-CM

## 2017-09-11 DIAGNOSIS — Z79899 Other long term (current) drug therapy: Secondary | ICD-10-CM | POA: Diagnosis not present

## 2017-09-11 DIAGNOSIS — R05 Cough: Secondary | ICD-10-CM | POA: Diagnosis not present

## 2017-09-11 LAB — COMPREHENSIVE METABOLIC PANEL
ALK PHOS: 107 U/L (ref 38–126)
ALT: 12 U/L — ABNORMAL LOW (ref 14–54)
ANION GAP: 7 (ref 5–15)
AST: 20 U/L (ref 15–41)
Albumin: 2.5 g/dL — ABNORMAL LOW (ref 3.5–5.0)
BILIRUBIN TOTAL: 0.9 mg/dL (ref 0.3–1.2)
BUN: 53 mg/dL — ABNORMAL HIGH (ref 6–20)
CALCIUM: 9.4 mg/dL (ref 8.9–10.3)
CO2: 27 mmol/L (ref 22–32)
Chloride: 103 mmol/L (ref 101–111)
Creatinine, Ser: 2.05 mg/dL — ABNORMAL HIGH (ref 0.44–1.00)
GFR, EST AFRICAN AMERICAN: 24 mL/min — AB (ref >60)
GFR, EST NON AFRICAN AMERICAN: 21 mL/min — AB (ref >60)
GLUCOSE: 141 mg/dL — AB (ref 65–99)
Potassium: 5 mmol/L (ref 3.5–5.1)
Sodium: 137 mmol/L (ref 135–145)
TOTAL PROTEIN: 6.9 g/dL (ref 6.5–8.1)

## 2017-09-11 LAB — URINALYSIS, ROUTINE W REFLEX MICROSCOPIC
Bilirubin Urine: NEGATIVE
GLUCOSE, UA: NEGATIVE mg/dL
Hgb urine dipstick: NEGATIVE
Ketones, ur: NEGATIVE mg/dL
NITRITE: NEGATIVE
Protein, ur: 100 mg/dL — AB
SPECIFIC GRAVITY, URINE: 1.01 (ref 1.005–1.030)
pH: 5 (ref 5.0–8.0)

## 2017-09-11 LAB — LIPASE, BLOOD: LIPASE: 29 U/L (ref 11–51)

## 2017-09-11 LAB — CBC
HCT: 29 % — ABNORMAL LOW (ref 36.0–46.0)
HEMOGLOBIN: 8.6 g/dL — AB (ref 12.0–15.0)
MCH: 25.7 pg — ABNORMAL LOW (ref 26.0–34.0)
MCHC: 29.7 g/dL — AB (ref 30.0–36.0)
MCV: 86.8 fL (ref 78.0–100.0)
Platelets: 293 10*3/uL (ref 150–400)
RBC: 3.34 MIL/uL — ABNORMAL LOW (ref 3.87–5.11)
RDW: 19.7 % — ABNORMAL HIGH (ref 11.5–15.5)
WBC: 25.4 10*3/uL — ABNORMAL HIGH (ref 4.0–10.5)

## 2017-09-11 MED ORDER — CIPROFLOXACIN IN D5W 400 MG/200ML IV SOLN
400.0000 mg | Freq: Once | INTRAVENOUS | Status: AC
  Administered 2017-09-11: 400 mg via INTRAVENOUS
  Filled 2017-09-11: qty 200

## 2017-09-11 MED ORDER — SODIUM CHLORIDE 0.9 % IV BOLUS (SEPSIS)
500.0000 mL | Freq: Once | INTRAVENOUS | Status: AC
  Administered 2017-09-11: 500 mL via INTRAVENOUS

## 2017-09-11 MED ORDER — CIPROFLOXACIN HCL 250 MG PO TABS: 250.0000 mg | ORAL_TABLET | Freq: Two times a day (BID) | ORAL | 0 refills | Status: DC

## 2017-09-11 NOTE — Discharge Instructions (Signed)
Make sure that she is eating and drinking normally.  Encourage fluids especially water frequently.  Take the antibiotic, as directed.  Return here, if needed, for problems.

## 2017-09-11 NOTE — ED Provider Notes (Signed)
[facility] DEPT Provider Note   CSN: [id number] Arrival date & time: 09/11/17  1450     History   Chief Complaint Chief Complaint  Patient presents with  . Emesis  . Nausea  . Leg Swelling    HPI Valerie Downs is a 83 y.o. female.  She is here for evaluation of cough, with clear sputum, and persistent leg swelling with weakness.  She is taking her usual medicines, without relief.  She states that her lung doctor told her she had to "spots on the lung,", and that she needed a biopsy.  She does not weigh herself regularly at home.  She is able to eat, but sometimes has a poor appetite so does not eat a lot according to her daughter with whom she lives.  There is been no fever, chills, vomiting, diarrhea, focal weakness or paresthesia.  She continues to smoke cigarettes.  There are no other known modifying factors.  HPI  Past Medical History:  Diagnosis Date  . Aortic insufficiency   . Blood transfusion without reported diagnosis   . Breast cancer ([place])   . Cardiomyopathy ([place])   . CHF (congestive heart failure) ([place])   . History of echocardiogram    Echo 4/18: severe focal basal and mod conc LVH, EF 50-55, no RWMA, Gr 2 DD, mod AI, severe MR, mild LAE, mildly reduced RVSF, severe TR, mod PI, PASP 65, mild pericardial eff  . Hypertension   . Mitral regurgitation     Patient Active Problem List   Diagnosis Date Noted  . Hyperkalemia 05/20/2017  . Acute on chronic diastolic CHF (congestive heart failure) ([place]) 05/20/2017  . Protein-calorie malnutrition, severe 05/20/2017  . UGI bleed 05/19/2017  . Moderate to severe pulmonary hypertension ([place]) 01/11/2017  . Chronic diastolic CHF (congestive heart failure) ([place]) 01/11/2017  . Goals of care, counseling/discussion   . Acute renal failure (ARF) ([place]) 01/02/2017  . Right lower lobe lung mass 01/02/2017  . Fever 01/02/2017  . Elevated troponin 01/02/2017  . Pericardial effusion 01/02/2017  . Elevated brain natriuretic  peptide (BNP) level 01/02/2017  . Anemia 01/01/2017  . Medicare annual wellness visit, subsequent 11/04/2016  . Routine general medical examination at a health care facility 11/04/2016  . Cigarette smoker 09/17/2016  . Dyspnea on exertion 09/17/2016  . Solitary pulmonary nodule 09/16/2016  . Ovarian mass, left 07/07/2016  . Chest pain 07/06/2016  . AKI (acute kidney injury) ([place]) 08/16/2014  . GI bleeding 08/15/2014  . Rectal bleed 08/15/2014  . Heme positive stool 06/22/2014  . Symptomatic anemia 06/21/2014  . Iron deficiency anemia 06/21/2014  . Malnutrition of moderate degree ([place]) 07/28/2013  . UTI (lower urinary tract infection) 07/26/2013  . HYPERTENSION, BENIGN SYSTEMIC 02/23/2007    Past Surgical History:  Procedure Laterality Date  . COLONOSCOPY N/A 06/22/2014   Procedure: COLONOSCOPY;  Surgeon: Jerene Bears, MD;  [facility];  Service: Endoscopy;  Laterality: N/A;  . ENDOBRONCHIAL ULTRASOUND N/A 12/31/2016   Procedure: ENDOBRONCHIAL ULTRASOUND;  Surgeon: Juanito Doom, MD;  Location[facility];  Service: Cardiopulmonary;  Laterality: N/A;  . ESOPHAGOGASTRODUODENOSCOPY N/A 06/22/2014   Procedure: ESOPHAGOGASTRODUODENOSCOPY (EGD);  Surgeon: Jerene Bears, MD;  Location: [facility];  Service: Endoscopy;  Laterality: N/A;  . MASTECTOMY Left     OB History    Gravida Para Term Preterm AB Living   10 10 10          SAB TAB Ectopic Multiple Live Births  10       Home Medications    Prior to Admission medications   Medication Sig Start Date End Date Taking? Authorizing Provider  chlorhexidine (PERIDEX) 0.12 % solution Rinse with 1/2 ounce by mouth for 30 seconds then spit out. use twice a day 07/17/17   [provider]  ciprofloxacin (CIPRO) 250 MG tablet Take 1 tablet (250 mg total) by mouth every 12 (twelve) hours. 09/11/17   Daleen Bo, MD  feeding supplement (BOOST / RESOURCE BREEZE) LIQD Take 1 Container by mouth 2 (two) times  daily between meals. 05/21/17   Sheikh, Hatley, DO  feeding supplement, ENSURE ENLIVE, (ENSURE ENLIVE) LIQD Take 237 mLs by mouth daily. 05/21/17   Raiford Noble Latif, DO  furosemide (LASIX) 40 MG tablet Take 2 tablets (80 mg total) by mouth daily. 08/30/17   Imogene Burn, PA-C  HYDROcodone-acetaminophen (NORCO/VICODIN) 5-325 MG tablet Take 1 tablet by mouth every 6 hours if needed for pain 07/26/17   [provider]  metoprolol tartrate (LOPRESSOR) 25 MG tablet Take 1 tablet (25 mg total) by mouth 2 (two) times daily. 06/09/16   Burtis Junes, NP    Family History Family History  Problem Relation Age of Onset  . Hypertension Mother   . Cancer Father     Social History Social History  Substance Use Topics  . Smoking status: Current Some Day Smoker    Packs/day: 0.25    Years: 50.00    Types: Cigarettes  . Smokeless tobacco: Former Systems developer    Types: Snuff     Comment: per patient has not used snuff in a while (10/18/16)  . Alcohol use No     Allergies   Penicillins   Review of Systems Review of Systems  All other systems reviewed and are negative.    Physical Exam Updated Vital Signs BP (!) 143/67 (BP Location: Right Arm)   Pulse 62   Temp 98.2 F (36.8 C) (Oral)   Resp 18   SpO2 99%   Physical Exam  Constitutional: She is oriented to person, place, and time. She appears well-developed.  Elderly, frail  HENT:  Head: Normocephalic and atraumatic.  Eyes: Pupils are equal, round, and reactive to light. Conjunctivae and EOM are normal.  Neck: Normal range of motion and phonation normal. Neck supple.  Cardiovascular: Normal rate and regular rhythm.   Pulmonary/Chest: Effort normal and breath sounds normal. No respiratory distress. She exhibits no tenderness.  Decreased air movement both lungs, with scattered rhonchi.  There is no increased work of breathing.  Abdominal: Soft. She exhibits no distension. There is no tenderness. There is no guarding.    Musculoskeletal: Normal range of motion. She exhibits edema (1-2+ lower leg edema, mildly tender to palpation.  No associated redness or drainage.).  Neurological: She is alert and oriented to person, place, and time. She exhibits normal muscle tone.  Skin: Skin is warm and dry.  Psychiatric: She has a normal mood and affect. Her behavior is normal.  Nursing note and vitals reviewed.    ED Treatments / Results  Labs (all labs ordered are listed, but only abnormal results are displayed) Labs Reviewed  COMPREHENSIVE METABOLIC PANEL - Abnormal; Notable for the following:       Result Value   Glucose, Bld 141 (*)    BUN 53 (*)    Creatinine, Ser 2.05 (*)    Albumin 2.5 (*)    ALT 12 (*)    GFR calc non Af  Amer 21 (*)    GFR calc Af Amer 24 (*)    All other components within normal limits  CBC - Abnormal; Notable for the following:    WBC 25.4 (*)    RBC 3.34 (*)    Hemoglobin 8.6 (*)    HCT 29.0 (*)    MCH 25.7 (*)    MCHC 29.7 (*)    RDW 19.7 (*)    All other components within normal limits  URINALYSIS, ROUTINE W REFLEX MICROSCOPIC - Abnormal; Notable for the following:    APPearance HAZY (*)    Protein, ur 100 (*)    Leukocytes, UA MODERATE (*)    Bacteria, UA FEW (*)    Squamous Epithelial / LPF 0-5 (*)    All other components within normal limits  URINE CULTURE  LIPASE, BLOOD    EKG  EKG Interpretation None       Radiology Dg Chest 2 View  Result Date: 09/11/2017 CLINICAL DATA:  Cough. EXAM: CHEST  2 VIEW COMPARISON:  08/19/2017 FINDINGS: There is marked cardiac enlargement. No pleural effusion or edema identified. No airspace opacities identified. Mild chronic interstitial coarsening identified. IMPRESSION: Cardiac enlargement.  No acute findings. Electronically Signed   By: Kerby Moors M.D.   On: 09/11/2017 21:38    Procedures Procedures (including critical care time)  Medications Ordered in ED Medications  ciprofloxacin (CIPRO) IVPB 400 mg (400 mg  Intravenous New Bag/Given 09/11/17 2253)  sodium chloride 0.9 % bolus 500 mL (500 mLs Intravenous New Bag/Given 09/11/17 2253)     Initial Impression / Assessment and Plan / ED Course  I have reviewed the triage vital signs and the nursing notes.  Pertinent labs & imaging results that were available during my care of the patient were reviewed by me and considered in my medical decision making (see chart for details).      Patient Vitals for the past 24 hrs:  BP Temp Temp src Pulse Resp SpO2  09/11/17 1737 (!) 143/67 - - 62 18 99 %  09/11/17 1520 (!) 142/77 98.2 F (36.8 C) Oral 78 18 99 %    11:22 PM Reevaluation with update and discussion. After initial assessment and treatment, an updated evaluation reveals she tolerated a can of soup, and 8 ounces of water without distress.  Findings discussed with patient's daughter who helps her at home, and all questions were answered. Annaliza Zia L     Final Clinical Impressions(s) / ED Diagnoses   Final diagnoses:  Urinary tract infection without hematuria, site unspecified  Dehydration  Renal insufficiency  Peripheral edema    Weakness and malaise secondary to likely urinary tract infection.  Doubt impending vascular collapse or metabolic instability.  Doubt acute congestive heart failure.  Nursing Notes Reviewed/ Care Coordinated Applicable Imaging Reviewed Interpretation of Laboratory Data incorporated into ED treatment  The patient appears reasonably screened and/or stabilized for discharge and I doubt any other medical condition or other [facility] requiring further screening, evaluation, or treatment in the ED at this time prior to discharge.  Plan: Home Medications-continue current medications; Home Treatments-rest, elevate legs, compression stockings as needed; return here if the recommended treatment, does not improve the symptoms; Recommended follow up-PCP checkup as needed and in 1 week for repeat urine testing        New  Prescriptions New Prescriptions   CIPROFLOXACIN (CIPRO) 250 MG TABLET    Take 1 tablet (250 mg total) by mouth every 12 (twelve) hours.     Eulis Foster,  Vira Agar, MD 09/11/17 2333

## 2017-09-11 NOTE — ED Triage Notes (Signed)
Pt from home with c/o bilateral lower leg edema x 6 weeks. Pt states she was recently admitted for the same. Pt reports hx of CHF. Pt also reports nausea and emesis x 6 weeks. Pt's last emesis was yesterday. Pt was seen. Pt was admitted at the end of August for similar symptoms and states she got a blood transfusion for low hemoglobin. Pt states after discharge her edema got worse and her pain in her legs got worse; now a 7/10

## 2017-09-13 LAB — URINE CULTURE

## 2017-09-20 ENCOUNTER — Ambulatory Visit: Payer: Medicare Other | Admitting: Family

## 2017-09-21 ENCOUNTER — Inpatient Hospital Stay (HOSPITAL_COMMUNITY)
Admission: EM | Admit: 2017-09-21 | Discharge: 2017-10-02 | DRG: 291 | Disposition: A | Discharge status: Hospice | Payer: Medicare Other | Attending: Family Medicine | Admitting: Family Medicine

## 2017-09-21 ENCOUNTER — Encounter (HOSPITAL_COMMUNITY): Payer: Self-pay

## 2017-09-21 ENCOUNTER — Ambulatory Visit: Payer: Medicare Other | Admitting: Family

## 2017-09-21 ENCOUNTER — Emergency Department (HOSPITAL_COMMUNITY): Payer: Medicare Other

## 2017-09-21 DIAGNOSIS — I2721 Secondary pulmonary arterial hypertension: Secondary | ICD-10-CM | POA: Diagnosis not present

## 2017-09-21 DIAGNOSIS — R229 Localized swelling, mass and lump, unspecified: Secondary | ICD-10-CM | POA: Diagnosis not present

## 2017-09-21 DIAGNOSIS — I422 Other hypertrophic cardiomyopathy: Secondary | ICD-10-CM | POA: Diagnosis present

## 2017-09-21 DIAGNOSIS — E875 Hyperkalemia: Secondary | ICD-10-CM | POA: Diagnosis not present

## 2017-09-21 DIAGNOSIS — F17210 Nicotine dependence, cigarettes, uncomplicated: Secondary | ICD-10-CM | POA: Diagnosis present

## 2017-09-21 DIAGNOSIS — I10 Essential (primary) hypertension: Secondary | ICD-10-CM | POA: Diagnosis not present

## 2017-09-21 DIAGNOSIS — I5031 Acute diastolic (congestive) heart failure: Secondary | ICD-10-CM | POA: Insufficient documentation

## 2017-09-21 DIAGNOSIS — Z853 Personal history of malignant neoplasm of breast: Secondary | ICD-10-CM

## 2017-09-21 DIAGNOSIS — E43 Unspecified severe protein-calorie malnutrition: Secondary | ICD-10-CM | POA: Diagnosis not present

## 2017-09-21 DIAGNOSIS — N183 Chronic kidney disease, stage 3 unspecified: Secondary | ICD-10-CM

## 2017-09-21 DIAGNOSIS — I130 Hypertensive heart and chronic kidney disease with heart failure and stage 1 through stage 4 chronic kidney disease, or unspecified chronic kidney disease: Principal | ICD-10-CM | POA: Diagnosis present

## 2017-09-21 DIAGNOSIS — Z8249 Family history of ischemic heart disease and other diseases of the circulatory system: Secondary | ICD-10-CM

## 2017-09-21 DIAGNOSIS — E861 Hypovolemia: Secondary | ICD-10-CM | POA: Diagnosis not present

## 2017-09-21 DIAGNOSIS — Z6828 Body mass index (BMI) 28.0-28.9, adult: Secondary | ICD-10-CM | POA: Diagnosis not present

## 2017-09-21 DIAGNOSIS — D72823 Leukemoid reaction: Secondary | ICD-10-CM | POA: Diagnosis not present

## 2017-09-21 DIAGNOSIS — I5033 Acute on chronic diastolic (congestive) heart failure: Secondary | ICD-10-CM | POA: Diagnosis not present

## 2017-09-21 DIAGNOSIS — D649 Anemia, unspecified: Secondary | ICD-10-CM

## 2017-09-21 DIAGNOSIS — N179 Acute kidney failure, unspecified: Secondary | ICD-10-CM | POA: Diagnosis not present

## 2017-09-21 DIAGNOSIS — K8020 Calculus of gallbladder without cholecystitis without obstruction: Secondary | ICD-10-CM | POA: Diagnosis not present

## 2017-09-21 DIAGNOSIS — J90 Pleural effusion, not elsewhere classified: Secondary | ICD-10-CM

## 2017-09-21 DIAGNOSIS — R7989 Other specified abnormal findings of blood chemistry: Secondary | ICD-10-CM | POA: Diagnosis present

## 2017-09-21 DIAGNOSIS — R846 Abnormal cytological findings in specimens from respiratory organs and thorax: Secondary | ICD-10-CM | POA: Diagnosis not present

## 2017-09-21 DIAGNOSIS — R918 Other nonspecific abnormal finding of lung field: Secondary | ICD-10-CM | POA: Diagnosis not present

## 2017-09-21 DIAGNOSIS — D638 Anemia in other chronic diseases classified elsewhere: Secondary | ICD-10-CM | POA: Diagnosis not present

## 2017-09-21 DIAGNOSIS — Z9012 Acquired absence of left breast and nipple: Secondary | ICD-10-CM

## 2017-09-21 DIAGNOSIS — Z7189 Other specified counseling: Secondary | ICD-10-CM | POA: Diagnosis not present

## 2017-09-21 DIAGNOSIS — Z9889 Other specified postprocedural states: Secondary | ICD-10-CM

## 2017-09-21 DIAGNOSIS — Z66 Do not resuscitate: Secondary | ICD-10-CM | POA: Diagnosis not present

## 2017-09-21 DIAGNOSIS — I509 Heart failure, unspecified: Secondary | ICD-10-CM | POA: Diagnosis not present

## 2017-09-21 DIAGNOSIS — N390 Urinary tract infection, site not specified: Secondary | ICD-10-CM | POA: Diagnosis present

## 2017-09-21 DIAGNOSIS — R531 Weakness: Secondary | ICD-10-CM

## 2017-09-21 DIAGNOSIS — N19 Unspecified kidney failure: Secondary | ICD-10-CM

## 2017-09-21 DIAGNOSIS — I110 Hypertensive heart disease with heart failure: Secondary | ICD-10-CM | POA: Diagnosis not present

## 2017-09-21 DIAGNOSIS — D500 Iron deficiency anemia secondary to blood loss (chronic): Secondary | ICD-10-CM | POA: Diagnosis not present

## 2017-09-21 DIAGNOSIS — R32 Unspecified urinary incontinence: Secondary | ICD-10-CM | POA: Diagnosis present

## 2017-09-21 DIAGNOSIS — Z515 Encounter for palliative care: Secondary | ICD-10-CM

## 2017-09-21 DIAGNOSIS — I480 Paroxysmal atrial fibrillation: Secondary | ICD-10-CM | POA: Diagnosis present

## 2017-09-21 DIAGNOSIS — R0602 Shortness of breath: Secondary | ICD-10-CM

## 2017-09-21 DIAGNOSIS — R778 Other specified abnormalities of plasma proteins: Secondary | ICD-10-CM | POA: Diagnosis present

## 2017-09-21 DIAGNOSIS — Z87440 Personal history of urinary (tract) infections: Secondary | ICD-10-CM | POA: Diagnosis not present

## 2017-09-21 DIAGNOSIS — D631 Anemia in chronic kidney disease: Secondary | ICD-10-CM | POA: Diagnosis not present

## 2017-09-21 DIAGNOSIS — R627 Adult failure to thrive: Secondary | ICD-10-CM | POA: Diagnosis present

## 2017-09-21 DIAGNOSIS — I080 Rheumatic disorders of both mitral and aortic valves: Secondary | ICD-10-CM | POA: Diagnosis present

## 2017-09-21 DIAGNOSIS — N281 Cyst of kidney, acquired: Secondary | ICD-10-CM | POA: Diagnosis not present

## 2017-09-21 DIAGNOSIS — R748 Abnormal levels of other serum enzymes: Secondary | ICD-10-CM | POA: Diagnosis not present

## 2017-09-21 HISTORY — DX: Chronic kidney disease, stage 3 (moderate): N18.3

## 2017-09-21 HISTORY — DX: Chronic kidney disease, stage 3 unspecified: N18.30

## 2017-09-21 LAB — CBC WITH DIFFERENTIAL/PLATELET
Basophils Absolute: 0 10*3/uL (ref 0.0–0.1)
Basophils Relative: 0 %
Eosinophils Absolute: 0 10*3/uL (ref 0.0–0.7)
Eosinophils Relative: 0 %
HCT: 23.9 % — ABNORMAL LOW (ref 36.0–46.0)
Hemoglobin: 7 g/dL — ABNORMAL LOW (ref 12.0–15.0)
Lymphocytes Relative: 15 %
Lymphs Abs: 1.3 10*3/uL (ref 0.7–4.0)
MCH: 24.6 pg — ABNORMAL LOW (ref 26.0–34.0)
MCHC: 29.3 g/dL — ABNORMAL LOW (ref 30.0–36.0)
MCV: 83.9 fL (ref 78.0–100.0)
Monocytes Absolute: 0.7 10*3/uL (ref 0.1–1.0)
Monocytes Relative: 8 %
Neutro Abs: 6.4 10*3/uL (ref 1.7–7.7)
Neutrophils Relative %: 77 %
Platelets: 298 10*3/uL (ref 150–400)
RBC: 2.85 MIL/uL — ABNORMAL LOW (ref 3.87–5.11)
RDW: 20.1 % — ABNORMAL HIGH (ref 11.5–15.5)
WBC: 8.5 10*3/uL (ref 4.0–10.5)

## 2017-09-21 LAB — URINALYSIS, ROUTINE W REFLEX MICROSCOPIC
Bilirubin Urine: NEGATIVE
Glucose, UA: NEGATIVE mg/dL
Ketones, ur: NEGATIVE mg/dL
Nitrite: NEGATIVE
Protein, ur: 100 mg/dL — AB
RBC / HPF: NONE SEEN RBC/hpf (ref 0–5)
Specific Gravity, Urine: 1.015 (ref 1.005–1.030)
pH: 5 (ref 5.0–8.0)

## 2017-09-21 LAB — ABO/RH: ABO/RH(D): O POS

## 2017-09-21 LAB — BASIC METABOLIC PANEL
ANION GAP: 8 (ref 5–15)
BUN: 40 mg/dL — AB (ref 6–20)
CHLORIDE: 105 mmol/L (ref 101–111)
CO2: 22 mmol/L (ref 22–32)
Calcium: 8.6 mg/dL — ABNORMAL LOW (ref 8.9–10.3)
Creatinine, Ser: 1.49 mg/dL — ABNORMAL HIGH (ref 0.44–1.00)
GFR calc Af Amer: 36 mL/min — ABNORMAL LOW (ref >60)
GFR calc non Af Amer: 31 mL/min — ABNORMAL LOW (ref >60)
Glucose, Bld: 94 mg/dL (ref 65–99)
POTASSIUM: 5.2 mmol/L — AB (ref 3.5–5.1)
Sodium: 135 mmol/L (ref 135–145)

## 2017-09-21 LAB — I-STAT TROPONIN, ED: Troponin i, poc: 0.1 ng/mL (ref 0.00–0.08)

## 2017-09-21 LAB — POC OCCULT BLOOD, ED: Fecal Occult Bld: POSITIVE — AB

## 2017-09-21 LAB — PROTIME-INR
INR: 1.11
Prothrombin Time: 14.2 seconds (ref 11.4–15.2)

## 2017-09-21 LAB — PREPARE RBC (CROSSMATCH)

## 2017-09-21 LAB — BRAIN NATRIURETIC PEPTIDE

## 2017-09-21 MED ORDER — SODIUM CHLORIDE 0.9% FLUSH
3.0000 mL | Freq: Two times a day (BID) | INTRAVENOUS | Status: DC
  Administered 2017-09-21 – 2017-10-02 (×14): 3 mL via INTRAVENOUS

## 2017-09-21 MED ORDER — ONDANSETRON HCL 4 MG PO TABS: 4.0000 mg | ORAL_TABLET | Freq: Four times a day (QID) | ORAL | Status: DC | PRN

## 2017-09-21 MED ORDER — DEXTROSE 5 % IV SOLN
1.0000 g | INTRAVENOUS | Status: DC
  Administered 2017-09-21 – 2017-09-25 (×5): 1 g via INTRAVENOUS
  Filled 2017-09-21 (×7): qty 10

## 2017-09-21 MED ORDER — METOPROLOL TARTRATE 25 MG PO TABS
25.0000 mg | ORAL_TABLET | Freq: Two times a day (BID) | ORAL | Status: DC
  Administered 2017-09-21 – 2017-09-29 (×15): 25 mg via ORAL
  Filled 2017-09-21 (×16): qty 1

## 2017-09-21 MED ORDER — ONDANSETRON HCL 4 MG/2ML IJ SOLN
4.0000 mg | Freq: Four times a day (QID) | INTRAMUSCULAR | Status: DC | PRN
  Administered 2017-09-22: 4 mg via INTRAVENOUS
  Filled 2017-09-21: qty 2

## 2017-09-21 MED ORDER — FUROSEMIDE 10 MG/ML IJ SOLN
60.0000 mg | Freq: Once | INTRAMUSCULAR | Status: AC
  Administered 2017-09-21: 60 mg via INTRAVENOUS
  Filled 2017-09-21: qty 6

## 2017-09-21 MED ORDER — ACETAMINOPHEN 325 MG PO TABS
650.0000 mg | ORAL_TABLET | Freq: Four times a day (QID) | ORAL | Status: DC | PRN
  Filled 2017-09-21: qty 2

## 2017-09-21 MED ORDER — SODIUM CHLORIDE 0.9 % IV SOLN
10.0000 mL/h | Freq: Once | INTRAVENOUS | Status: AC
  Administered 2017-09-21: 10 mL/h via INTRAVENOUS

## 2017-09-21 MED ORDER — FUROSEMIDE 10 MG/ML IJ SOLN
60.0000 mg | Freq: Two times a day (BID) | INTRAMUSCULAR | Status: DC
  Administered 2017-09-22: 60 mg via INTRAVENOUS
  Filled 2017-09-21: qty 6

## 2017-09-21 MED ORDER — SODIUM CHLORIDE 0.9% FLUSH: 3.0000 mL | INTRAVENOUS | Status: DC | PRN

## 2017-09-21 MED ORDER — SODIUM CHLORIDE 0.9 % IV SOLN: 250.0000 mL | INTRAVENOUS | Status: DC | PRN

## 2017-09-21 MED ORDER — ACETAMINOPHEN 650 MG RE SUPP: 650.0000 mg | Freq: Four times a day (QID) | RECTAL | Status: DC | PRN

## 2017-09-21 MED ORDER — BOOST / RESOURCE BREEZE PO LIQD
1.0000 | Freq: Two times a day (BID) | ORAL | Status: DC
  Administered 2017-09-22 (×2): 1 via ORAL
  Administered 2017-09-23: 237 mL via ORAL
  Administered 2017-09-24 – 2017-09-30 (×5): 1 via ORAL
  Filled 2017-09-21 (×2): qty 1

## 2017-09-21 NOTE — ED Triage Notes (Signed)
Pt presents to the ed with complaints of pain shooting from her lower abdomen to shoulders and swelling in her knees x 1 week.  Pt has no complaints at this time other than knee swelling. She was just admitted for CHF.

## 2017-09-21 NOTE — ED Notes (Signed)
Dr. Merrell at bedside. 

## 2017-09-21 NOTE — Progress Notes (Addendum)
Pharmacy Antibiotic Note  Valerie Downs is a 83 y.o. female admitted on 09/21/2017 with chest pain and possible UTI. Discussed penicillin allergy with physician, will proceed with ceftriaxone and monitor patient. Also discussed with floor RN.  Plan: -Ceftriaxone 1 g IV q24h  Height: 5\' 1"  (154.9 cm) Weight: 138 lb 8 oz (62.8 kg) IBW/kg (Calculated) : 47.8  Temp (24hrs), Avg:98.3 F (36.8 C), Min:98.1 F (36.7 C), Max:98.4 F (36.9 C)   Recent Labs Lab 09/21/17 1119 09/21/17 1547  WBC 8.5  --   CREATININE  --  1.49*    Estimated Creatinine Clearance: 23.4 mL/min (A) (by C-G formula based on SCr of 1.49 mg/dL (H)).    Allergies  Allergen Reactions  . Penicillins Anaphylaxis and Swelling    Has patient had a PCN reaction causing immediate rash, facial/tongue/throat swelling, SOB or lightheadedness with hypotension: Yes Has patient had a PCN reaction causing severe rash involving mucus membranes or skin necrosis: Yes Has patient had a PCN reaction that required hospitalization Yes Has patient had a PCN reaction occurring within the last 10 years: No If all of the above answers are "NO", then may proceed with Cephalosporin use.       Harvel Quale 09/21/2017 8:57 PM

## 2017-09-21 NOTE — ED Notes (Signed)
EKG completed

## 2017-09-21 NOTE — ED Notes (Signed)
Patient transported to X-ray 

## 2017-09-21 NOTE — ED Notes (Signed)
Type and screen obtained and sent to lab. Double checked by Vikki Ports, RN. Consent signed by pt and at bedside.

## 2017-09-21 NOTE — H&P (Signed)
History and Physical    Valerie Downs EQA:[id number] DOB: [birth year] DOA: 09/21/2017  PCP: Golden Circle, FNP Patient coming from: home  Chief Complaint: weakness.  HPI: Valerie Downs is a 83 y.o. female with medical history significant of aortic insufficiency, mitral regurgitation, chronic blood loss anemia, diastolic congestive heart failure, hypertension, C KD, tobacco abuse, breast cancer status post mastectomy 1984. Patient is a very poor historian at baseline.  Patient is a very poor historian and only provides general details with regards to her overall health and current complaints. Patient complaining of generalized weakness and just feeling bad. States that "everything is going wrong. "Denies chest pain, abdominal pain, dysuria, frequency, neck stiffness, focal neurological deficit, palpitations, fevers, cough. Patient does endorse lower extremity swelling. Patient states she has had to sleep both lying down and sitting up at times recently. Patient states that her symptoms of generalized weakness fatigue have been getting progressively worse and are constant.   ED Course: Lasix 60 giveni n ED  Review of Systems: As per HPI otherwise all other systems reviewed and are negative  Ambulatory Status: unclear at this time.   Past Medical History:  Diagnosis Date  . Aortic insufficiency   . Blood transfusion without reported diagnosis   . Breast cancer ([place])   . Cardiomyopathy ([place])   . CHF (congestive heart failure) ([place])   . History of echocardiogram    Echo 4/18: severe focal basal and mod conc LVH, EF 50-55, no RWMA, Gr 2 DD, mod AI, severe MR, mild LAE, mildly reduced RVSF, severe TR, mod PI, PASP 65, mild pericardial eff  . Hypertension   . Mitral regurgitation     Past Surgical History:  Procedure Laterality Date  . COLONOSCOPY N/A 06/22/2014   Procedure: COLONOSCOPY;  Surgeon: Jerene Bears, MD;  Location: [facility];  Service: Endoscopy;  Laterality: N/A;  .  ENDOBRONCHIAL ULTRASOUND N/A 12/31/2016   Procedure: ENDOBRONCHIAL ULTRASOUND;  Surgeon: Juanito Doom, MD;  Location: [facility] ENDOSCOPY;  Service: Cardiopulmonary;  Laterality: N/A;  . ESOPHAGOGASTRODUODENOSCOPY N/A 06/22/2014   Procedure: ESOPHAGOGASTRODUODENOSCOPY (EGD);  Surgeon: Jerene Bears, MD;  Location: [facility];  Service: Endoscopy;  Laterality: N/A;  . MASTECTOMY Left     Social History   Social History  . Marital status: Widowed    Spouse name: N/A  . Number of children: 10  . Years of education: 5   Occupational History  . Retired    Social History Main Topics  . Smoking status: Current Some Day Smoker    Packs/day: 0.25    Years: 50.00    Types: Cigarettes  . Smokeless tobacco: Former Systems developer    Types: Snuff     Comment: per patient has not used snuff in a while (10/18/16)  . Alcohol use No  . Drug use: No  . Sexual activity: No   Other Topics Concern  . Not on file   Social History Narrative   Denies abuse and feels safe at home.     Allergies  Allergen Reactions  . Penicillins Anaphylaxis and Swelling    Has patient had a PCN reaction causing immediate rash, facial/tongue/throat swelling, SOB or lightheadedness with hypotension: Yes Has patient had a PCN reaction causing severe rash involving mucus membranes or skin necrosis: Yes Has patient had a PCN reaction that required hospitalization Yes Has patient had a PCN reaction occurring within the last 10 years: No If all of the above answers are "NO", then may proceed with Cephalosporin  use.     Family History  Problem Relation Age of Onset  . Hypertension Mother   . Cancer Father       Prior to Admission medications   Medication Sig Start Date End Date Taking? Authorizing Provider  feeding supplement (BOOST / RESOURCE BREEZE) LIQD Take 1 Container by mouth 2 (two) times daily between meals. 05/21/17  Yes Sheikh, Heath, DO  feeding supplement, ENSURE ENLIVE, (ENSURE ENLIVE) LIQD Take 237 mLs by  mouth daily. 05/21/17  Yes Sheikh, Omair Latif, DO  furosemide (LASIX) 40 MG tablet Take 2 tablets (80 mg total) by mouth daily. 08/30/17  Yes Imogene Burn, PA-C  metoprolol tartrate (LOPRESSOR) 25 MG tablet Take 1 tablet (25 mg total) by mouth 2 (two) times daily. 06/09/16  Yes Burtis Junes, NP  ciprofloxacin (CIPRO) 250 MG tablet Take 1 tablet (250 mg total) by mouth every 12 (twelve) hours. Patient not taking: Reported on 09/21/2017 09/11/17   Daleen Bo, MD    Physical Exam: Vitals:   09/21/17 1500 09/21/17 1545 09/21/17 1606 09/21/17 1642  BP: (!) 141/60 (!) 159/96 (!) 158/74 129/72  Pulse: 75 (!) 43 85 85  Resp: (!) 23 (!) 25 19 19   Temp:   98.4 F (36.9 C) 98.2 F (36.8 C)  TempSrc:   Oral Oral  SpO2: 100% 100% 100% 99%  Weight:         General: Frail and elderly. Resting bed. Eyes:  PERRL, EOMI, normal lids, iris ENT: Poor dentition. Dry mucous membranes Neck:  no LAD, masses or thyromegaly Cardiovascular: Faint heart sounds. II/VI systolic murmur 3+ LE pitting edema Respiratory: Denies throughout with few crackles in the bases. Abdomen:  soft, ntnd, NABS Skin:  no rash or induration seen on limited exam Musculoskeletal:  grossly normal tone BUE/BLE, good ROM, no bony abnormality Psychiatric:  grossly normal mood and affect, speech fluent and appropriate, AOx3 Neurologic:  CN 2-12 grossly intact, moves all extremities in coordinated fashion, sensation intact  Labs on Admission: I have personally reviewed following labs and imaging studies  CBC:  Recent Labs Lab 09/21/17 1119  WBC 8.5  NEUTROABS 6.4  HGB 7.0*  HCT 23.9*  MCV 83.9  PLT 284   Basic Metabolic Panel:  Recent Labs Lab 09/21/17 1547  NA 135  K 5.2*  CL 105  CO2 22  GLUCOSE 94  BUN 40*  CREATININE 1.49*  CALCIUM 8.6*   GFR: Estimated Creatinine Clearance: 23.8 mL/min (A) (by C-G formula based on SCr of 1.49 mg/dL (H)). Liver Function Tests: No results for input(s): AST, ALT,  ALKPHOS, BILITOT, PROT, ALBUMIN in the last 168 hours. No results for input(s): LIPASE, AMYLASE in the last 168 hours. No results for input(s): AMMONIA in the last 168 hours. Coagulation Profile:  Recent Labs Lab 09/21/17 1240  INR 1.11   Cardiac Enzymes: No results for input(s): CKTOTAL, CKMB, CKMBINDEX, TROPONINI in the last 168 hours. BNP (last 3 results)  Recent Labs  04/01/17 1142 04/28/17 1054  PROBNP 31,525* 3,325.0*   HbA1C: No results for input(s): HGBA1C in the last 72 hours. CBG: No results for input(s): GLUCAP in the last 168 hours. Lipid Profile: No results for input(s): CHOL, HDL, LDLCALC, TRIG, CHOLHDL, LDLDIRECT in the last 72 hours. Thyroid Function Tests: No results for input(s): TSH, T4TOTAL, FREET4, T3FREE, THYROIDAB in the last 72 hours. Anemia Panel: No results for input(s): VITAMINB12, FOLATE, FERRITIN, TIBC, IRON, RETICCTPCT in the last 72 hours. Urine analysis:    Component Value  Date/Time   COLORURINE AMBER (A) 09/21/2017 1404   APPEARANCEUR CLOUDY (A) 09/21/2017 1404   LABSPEC 1.015 09/21/2017 1404   PHURINE 5.0 09/21/2017 1404   GLUCOSEU NEGATIVE 09/21/2017 1404   HGBUR SMALL (A) 09/21/2017 1404   BILIRUBINUR NEGATIVE 09/21/2017 1404   KETONESUR NEGATIVE 09/21/2017 1404   PROTEINUR 100 (A) 09/21/2017 1404   UROBILINOGEN 0.2 08/16/2014 0535   NITRITE NEGATIVE 09/21/2017 1404   LEUKOCYTESUR LARGE (A) 09/21/2017 1404    Creatinine Clearance: Estimated Creatinine Clearance: 23.8 mL/min (A) (by C-G formula based on SCr of 1.49 mg/dL (H)).  Sepsis Labs: @LABRCNTIP (procalcitonin:4,lacticidven:4) ) Recent Results (from the past 240 hour(s))  Urine culture     Status: Abnormal   Collection Time: 09/11/17  9:03 PM  Result Value Ref Range Status   Specimen Description URINE, CLEAN CATCH  Final   Special Requests NONE  Final   Culture MULTIPLE SPECIES PRESENT, SUGGEST RECOLLECTION (A)  Final   Report Status 09/13/2017 FINAL  Final      Radiological Exams on Admission: Dg Chest 2 View  Result Date: 09/21/2017 CLINICAL DATA:  83 year old female with history of abdominal pain radiating into the shoulders. EXAM: CHEST  2 VIEW COMPARISON:  Chest x-ray 09/11/2017. FINDINGS: Lung volumes are low. Diffuse peribronchial cuffing. Patchy areas of interstitial prominence, most evident throughout the right mid to lower lung. No confluent consolidative airspace disease. Small right pleural effusion. Crowding of the pulmonary vasculature without frank pulmonary edema. Heart size appears mildly enlarged. Upper mediastinal contours are distorted by patient positioning. Aortic atherosclerosis. Surgical clips project over the left axillary region, presumably from prior lymph node dissection. IMPRESSION: 1. The appearance of the chest suggests bronchitis, potentially with developing right lower lobe bronchopneumonia. 2. Small right pleural effusion. 3. Mild cardiomegaly. 4. Aortic atherosclerosis. Electronically Signed   By: Vinnie Langton M.D.   On: 09/21/2017 13:22    EKG: Independently reviewed. Sinus, ventricular bigeminy  Assessment/Plan Active Problems:   HYPERTENSION, BENIGN SYSTEMIC   Lower urinary tract infectious disease   Symptomatic anemia   Elevated troponin   Acute on chronic diastolic CHF (congestive heart failure) (HCC)   CKD (chronic kidney disease), stage III   Acute on chronic diastolic CHF: BNP greater than 4500 with troponin 0.1. Marked 3+ lower extremity pitting edema which is worse than baseline for patient. Difficult to ascertain what dose of Lasix patient was on given her poor history. Suspect some medical noncompliance due to this fact. - Lasix 60 IV given in ED.- contiue this BID as it appears pt was taking 80mg  PO BID w/o relief - I/O, daily wts - cycle trop (elevation likely from decompensated CHF) - Tele - further recs per cardiology - continue bblocker  Symptomatic anemia: Hgb 7. Baseline 9. H/o chronic GI  bleed w/ extensive workup in past. Pt very weak and unable to stand. 2 units PRBC ordered by EDP. - f/u post transfusion labs  CKD: Cr 1.49. basleine 1.35. Anticipate some worsening given aggressive diuresis - BMET in am  UTI: Questionable given urinary simple contamination. Patient is quite frail and unable to accurate history. Due to concern for rapid deterioration of condition of patient does have a UTI favor treatment at this time. - Ceftriaxone - Urine culture. If culture is not suggestive of UTI and would recommend stopping ceftriaxone   DVT prophylaxis: SCD  Code Status: full  Family Communication: none  Disposition Plan: pending PRBC transfusion and improvement in CHF  Consults called: cardiology  Admission status: inpt  Waldemar Dickens MD [facility]  If 7PM-7AM, please contact night-coverage [email or web address] Password [facility]  09/21/2017, 5:32 PM

## 2017-09-21 NOTE — ED Notes (Signed)
EKG is not crossing over.  Repeated with portable.

## 2017-09-21 NOTE — Consult Note (Signed)
The patient has been seen in conjunction with Rosaria Ferries, NPC. All aspects of care have been considered and discussed. The patient has been personally interviewed, examined, and all clinical data has been reviewed.   Pleasant female with chronic shortness of breath worsening over the past few weeks. Background history of recurring decompensated diastolic heart failure in the setting of severe recurrent anemia with 2 hospital admissions since May for hemoglobin less than 7 requiring transfusion.  Returns today after developing graying vision, dyspnea, and profound weakness while attempting to get ready for a doctor's appointment. She was brought to the emergency room by family. Significant medical problems include recurring blood loss anemia, chronic diastolic heart failure, CKD stage IV, possible ovarian cancer, and question benign but enlarging lung mass.  Chronically ill-appearing elderly female, chronic lichenification of lower extremity skin from chronic edema, moderate JVD, systolic and diastolic heart murmur her with the patient lying on her right side along the left mid sternal border. Rhythm is irregular. Abdomen is soft. ECG reveals sinus rhythm with ventricular bigeminy. Hemoglobin is 7.0, Creatinine 2.05 on 09/11/2017, and BNP greater than 4500.  Recurrent acute on chronic diastolic heart failure precipitated by developing anemia. The elevated troponin is demand related. No specific cardiac workup is recommended. Recommend transfusion. Recommend discussion of goals of care. IV Lasix to relieve pulmonary congestion.    Cardiology Consultation:   Patient ID: Valerie Downs; [id number]; [birth year]   Admit date: 09/21/2017 Date of Consult: 09/21/2017  Primary Care Provider: Golden Circle, FNP Primary Cardiologist: Dr Angelena Form Primary Electrophysiologist:  n/a   Patient Profile:   Valerie Downs is a 83 y.o. female with a hx of hypertrophic CM w/out obstruction, EF 50-55%, mod  AI, severre MR/TR, severe PAH and small effusion by echo 03/2017, D-CHF, ovarian mass>>refused dx/tx, lung mass>>benign on bx but enlarging, chronic blood loss anemia, HTN, CKD III, breast CA>>mastectomy 1984, 4.7 cm AscAo aneurysm on CT 12/2016, who is being seen today for the evaluation of LE edema/CP at the request of Dr Sherry Ruffing.  Hospitalized 08/24-08/26 for D-CHF, anemia. Office visit 09/04, pt had decreased her Lasix to 40 mg qd, wt flat or mild trend up. Lasix increased from 40 mg qd>>80 mg qd. Wt 127 lbs.  History of Present Illness:   Ms. Cura felt like she was doing fairly well until today. She was getting ready for a scheduled MD appt and had sudden onset of SOB, and flushing, associated with presyncope and weakness. Her LE edema has been bad for a while, no worse today. She may have had some orthopnea, denies PND. She did not have any pain  In the ER, she is SOB on O2. She is uncomfortable, has some abd tenderness. She mentions black stools, says she thought it was from the chocolate drinks that she has daily.   Her LE edema has been bad. She does not pay much attention to the salt in foods she eats.   Her HR is irregular in the ER, she is not aware of that.   Past Medical History:  Diagnosis Date  . Aortic insufficiency   . Blood transfusion without reported diagnosis   . Breast cancer ([place])   . Cardiomyopathy ([place])   . CHF (congestive heart failure) ([place])   . History of echocardiogram    Echo 4/18: severe focal basal and mod conc LVH, EF 50-55, no RWMA, Gr 2 DD, mod AI, severe MR, mild LAE, mildly reduced RVSF, severe TR, mod PI, PASP  65, mild pericardial eff  . Hypertension   . Mitral regurgitation     Past Surgical History:  Procedure Laterality Date  . COLONOSCOPY N/A 06/22/2014   Procedure: COLONOSCOPY;  Surgeon: Jerene Bears, MD;  Location: [facility];  Service: Endoscopy;  Laterality: N/A;  . ENDOBRONCHIAL ULTRASOUND N/A 12/31/2016   Procedure: ENDOBRONCHIAL  ULTRASOUND;  Surgeon: Juanito Doom, MD;  Location: [facility];  Service: Cardiopulmonary;  Laterality: N/A;  . ESOPHAGOGASTRODUODENOSCOPY N/A 06/22/2014   Procedure: ESOPHAGOGASTRODUODENOSCOPY (EGD);  Surgeon: Jerene Bears, MD;  Location: [facility];  Service: Endoscopy;  Laterality: N/A;  . MASTECTOMY Left      Inpatient Medications: Scheduled Meds:  Continuous Infusions: . sodium chloride     PRN Meds:  Prior to Admission medications   Medication Sig Start Date End Date Taking? Authorizing Provider  chlorhexidine (PERIDEX) 0.12 % solution Rinse with 1/2 ounce by mouth for 30 seconds then spit out. use twice a day 07/17/17   [provider]  ciprofloxacin (CIPRO) 250 MG tablet Take 1 tablet (250 mg total) by mouth every 12 (twelve) hours. 09/11/17   Daleen Bo, MD  feeding supplement (BOOST / RESOURCE BREEZE) LIQD Take 1 Container by mouth 2 (two) times daily between meals. 05/21/17   Sheikh, [place], DO  feeding supplement, ENSURE ENLIVE, (ENSURE ENLIVE) LIQD Take 237 mLs by mouth daily. 05/21/17   Raiford Noble Latif, DO  furosemide (LASIX) 40 MG tablet Take 2 tablets (80 mg total) by mouth daily. 08/30/17   Imogene Burn, PA-C  HYDROcodone-acetaminophen (NORCO/VICODIN) 5-325 MG tablet Take 1 tablet by mouth every 6 hours if needed for pain 07/26/17   [provider]  metoprolol tartrate (LOPRESSOR) 25 MG tablet Take 1 tablet (25 mg total) by mouth 2 (two) times daily. 06/09/16   Burtis Junes, NP    Allergies:    Allergies  Allergen Reactions  . Penicillins Anaphylaxis and Swelling    Has patient had a PCN reaction causing immediate rash, facial/tongue/throat swelling, SOB or lightheadedness with hypotension: Yes Has patient had a PCN reaction causing severe rash involving mucus membranes or skin necrosis: Yes Has patient had a PCN reaction that required hospitalization Yes Has patient had a PCN reaction occurring within the last 10 years: No If  all of the above answers are "NO", then may proceed with Cephalosporin use.     Social History:   Social History   Social History  . Marital status: Widowed    Spouse name: N/A  . Number of children: 10  . Years of education: 5   Occupational History  . Retired    Social History Main Topics  . Smoking status: Current Some Day Smoker    Packs/day: 0.25    Years: 50.00    Types: Cigarettes  . Smokeless tobacco: Former Systems developer    Types: Snuff     Comment: per patient has not used snuff in a while (10/18/16)  . Alcohol use No  . Drug use: No  . Sexual activity: No   Other Topics Concern  . Not on file   Social History Narrative   Denies abuse and feels safe at home.     Family History:   The patient's family history includes Cancer in her father; Hypertension in her mother. Pt indicated that her mother is deceased. She indicated that her father is deceased. She indicated that her maternal grandmother is deceased. She indicated that her maternal grandfather is deceased. She indicated that her paternal  grandmother is deceased. She indicated that her paternal grandfather is deceased.    ROS:  Please see the history of present illness.  All other ROS reviewed and negative.      Physical Exam/Data:   Vitals:   09/21/17 1115 09/21/17 1117 09/21/17 1118 09/21/17 1130  BP: (!) 141/90 (!) 141/90  121/66  Pulse: (!) 110 64    Resp: (!) 28 18  12   Temp:  98.1 F (36.7 C)    SpO2: 100% 100%    Weight:   127 lb (57.6 kg)    No intake or output data in the 24 hours ending 09/21/17 1425 Filed Weights   09/21/17 1118  Weight: 127 lb (57.6 kg)   Body mass index is 21.8 kg/m.  General:  Well nourished, well developed, in moderate respiraotry distress HEENT: normal Lymph: no adenopathy Neck: JVD 10-11 cm Endocrine:  No thryomegaly Vascular: No carotid bruits; FA pulses 2+ bilaterally without bruits  Cardiac:  normal S1, S2; RRR; 1-6/1 SEM, no diastlic murmur heard;  irregular Lungs: bilateral rales, no wheezing, rhonchi  Abd: soft,  Diffusely tender, no hepatomegaly  Ext: trace-1+ edema Musculoskeletal:  No deformities, BUE and BLE strength weak but equal Skin: warm and dry  Neuro:  CNs 2-12 intact, no focal abnormalities noted Psych:  Normal affect   EKG:  The EKG was personally reviewed and demonstrates:  SR, frequent PACs and PVCs Telemetry:  Telemetry was personally reviewed and demonstrates:  SR, PACs and PVCs  Relevant CV Studies:  ECHO: 08/21/2017 - Left ventricle: The cavity size was normal. There was severe   focal basal and moderate concentric hypertrophy. Systolic   function was normal. The estimated ejection fraction was in the   range of 55% to 60%. Wall motion was normal; there were no   regional wall motion abnormalities. Features are consistent with   a pseudonormal left ventricular filling pattern, with concomitant   abnormal relaxation and increased filling pressure (grade 2   diastolic dysfunction). Doppler parameters are consistent with   high ventricular filling pressure. - Aortic valve: Trileaflet; moderately thickened, mildly calcified   leaflets. There was moderate regurgitation. - Aorta: Ascending aorta diameter: 41 mm (ED). - Ascending aorta: The ascending aorta was mildly dilated. - Mitral valve: Calcified annulus. There was moderate regurgitation   directed eccentrically and toward the septum. Peak gradient (D):7 mm Hg. - Left atrium: The atrium was severely dilated. - Right ventricle: The cavity size was mildly dilated. Wall   thickness was normal. Systolic function was mildly reduced. - Right atrium: The atrium was severely dilated. - Pulmonic valve: There was moderate regurgitation. - Tricuspid valve:   Structurally normal valve.  Transvalvular velocity was      within the normal range. There was mild regurgitation. - Pulmonary arteries: PA peak pressure: 60 mm Hg (S). - Pericardium, extracardiac: A small,  free-flowing pericardial   effusion was identified circumferential to the heart. The fluid   had no internal echoes. Impressions: - The right ventricular systolic pressure was increased consistent   with moderate pulmonary hypertension.  Myoview 07/07/16 IMPRESSION: 1. No reversible ischemia. Inferior wall attenuation or scar. 2. Global hypokinesis most marked in the septum. 3. Left ventricular ejection fraction 44% 4. Intermediate-risk stress test findings*. (medical Rx planned)  Laboratory Data:  Chemistry  BMP Latest Ref Rng & Units 09/11/2017 08/26/2017 08/21/2017  Glucose 65 - 99 mg/dL 141(H) 91 176(H)  BUN 6 - 20 mg/dL 53(H) 40(H) 48(H)  Creatinine 0.44 - 1.00  mg/dL 2.05(H) 1.34(H) 1.34(H)  BUN/Creat Ratio 12 - 28 - - -  Sodium 135 - 145 mmol/L 137 137 139  Potassium 3.5 - 5.1 mmol/L 5.0 4.6 4.9  Chloride 101 - 111 mmol/L 103 101 104  CO2 22 - 32 mmol/L 27 30 29   Calcium 8.9 - 10.3 mg/dL 9.4 9.4 9.3   Hepatic Function Latest Ref Rng & Units 09/11/2017 08/26/2017 08/20/2017  Total Protein 6.5 - 8.1 g/dL 6.9 7.3 7.4  Albumin 3.5 - 5.0 g/dL 2.5(L) 2.9(L) 2.6(L)  AST 15 - 41 U/L 20 35 28  ALT 14 - 54 U/L 12(L) 20 15  Alk Phosphatase 38 - 126 U/L 107 117 97  Total Bilirubin 0.3 - 1.2 mg/dL 0.9 0.8 1.0  Bilirubin, Direct 0.0 - 0.3 mg/dL - - -    Hematology  Recent Labs Lab 09/21/17 1119  WBC 8.5  RBC 2.85*  HGB 7.0*  HCT 23.9*  MCV 83.9  MCH 24.6*  MCHC 29.3*  RDW 20.1*  PLT 298   Cardiac EnzymesNo results for input(s): TROPONINI in the last 168 hours.   Recent Labs Lab 09/21/17 1143  TROPIPOC 0.10*    BNP  Recent Labs Lab 09/21/17 1223  BNP >4,500.0*    Radiology/Studies:  Dg Chest 2 View  Result Date: 09/21/2017 CLINICAL DATA:  83 year old female with history of abdominal pain radiating into the shoulders. EXAM: CHEST  2 VIEW COMPARISON:  Chest x-ray 09/11/2017. FINDINGS: Lung volumes are low. Diffuse peribronchial cuffing. Patchy areas of  interstitial prominence, most evident throughout the right mid to lower lung. No confluent consolidative airspace disease. Small right pleural effusion. Crowding of the pulmonary vasculature without frank pulmonary edema. Heart size appears mildly enlarged. Upper mediastinal contours are distorted by patient positioning. Aortic atherosclerosis. Surgical clips project over the left axillary region, presumably from prior lymph node dissection. IMPRESSION: 1. The appearance of the chest suggests bronchitis, potentially with developing right lower lobe bronchopneumonia. 2. Small right pleural effusion. 3. Mild cardiomegaly. 4. Aortic atherosclerosis. Electronically Signed   By: Vinnie Langton M.D.   On: 09/21/2017 13:22    Assessment and Plan:   1.   Acute on chronic diastolic CHF (congestive heart failure) ([place]) - pt needs Lasix, will give 40 mg IV x 1 and follow results - hx UTI>>UA ordered.  - ck BMET now and qd.  - slight elevation in troponin is c/w CHF - says she is taking 2 Lasix 40 mg tabs qam, she was told by a nurse to take 40 mg bid.  Active Problems: 2.  Symptomatic anemia - transfusion and eval per IM  3.  Acute renal failure (ARF) ([facility]) - BUN/Cr were up 09/16 (10 days after she was told to increase the Lasix to 80 mg q am) - She was told to take Lasix 40 mg bid instead and did so.  Jonetta Speak, PA-C  09/21/2017 2:25 PM

## 2017-09-21 NOTE — ED Provider Notes (Signed)
[facility] DEPT Provider Note   CSN: [id number] Arrival date & time: 09/21/17  1105     History   Chief Complaint Chief Complaint  Patient presents with  . Chest Pain    HPI Valerie Downs is a 83 y.o. female.  HPI    83 year old female with a past medical history of aortic insufficiency, mitral regurgitation, chronic blood loss anemia, chronic diastolic congestive heart failure, hypertension, chronic kidney disease, tobacco abuse, breast cancer status post mastectomy 1984, ovarian mass and enlarging lung mass presents today with complaints of weakness.  Patient is a poor historian but reports that she was hospitalized at the end of August.  She notes at that time she was hospitalized due to anemia.  After hospitalization she noted swelling in her bilateral knees and lower extremities, she reports this has happened frequently in the past, but has worse swelling today.  Patient notes she is able to ambulate but has pain in the knees with ambulation, she denies pain at rest.  Patient notes that she was preparing to see her primary care provider today for follow-up evaluation after hospitalization, she notes when getting ready she had an episode of feeling hot as if she was going to pass out.  She denies any chest pain to me, denies any shortness of breath, does not nausea but feels like she cannot vomit.  She notes the symptoms have now resolved.  Patient's son is at bedside reports that she was getting ready to go in to see her primary care, she called out to him to come into the room, witnessed her clutching her chest and complaining of chest pain.  Patient notes this sensation happens occasionally after eating. Pt denies any blood per rectum or dark stools.   Echo-08/21/2017 EF 55%-60%   Past Medical History:  Diagnosis Date  . Aortic insufficiency   . Blood transfusion without reported diagnosis   . Breast cancer ([place])   . Cardiomyopathy ([place])   . CHF (congestive heart failure)  ([place])   . History of echocardiogram    Echo 4/18: severe focal basal and mod conc LVH, EF 50-55, no RWMA, Gr 2 DD, mod AI, severe MR, mild LAE, mildly reduced RVSF, severe TR, mod PI, PASP 65, mild pericardial eff  . Hypertension   . Mitral regurgitation     Patient Active Problem List   Diagnosis Date Noted  . Acute diastolic (congestive) heart failure ([place]) 09/21/2017  . Hyperkalemia 05/20/2017  . Acute on chronic diastolic CHF (congestive heart failure) ([place]) 05/20/2017  . Protein-calorie malnutrition, severe 05/20/2017  . UGI bleed 05/19/2017  . Moderate to severe pulmonary hypertension ([place]) 01/11/2017  . Chronic diastolic CHF (congestive heart failure) ([place]) 01/11/2017  . Goals of care, counseling/discussion   . Acute renal failure (ARF) ([place]) 01/02/2017  . Right lower lobe lung mass 01/02/2017  . Fever 01/02/2017  . Elevated troponin 01/02/2017  . Pericardial effusion 01/02/2017  . Elevated brain natriuretic peptide (BNP) level 01/02/2017  . Anemia 01/01/2017  . Medicare annual wellness visit, subsequent 11/04/2016  . Routine general medical examination at a health care facility 11/04/2016  . Cigarette smoker 09/17/2016  . Dyspnea on exertion 09/17/2016  . Solitary pulmonary nodule 09/16/2016  . Ovarian mass, left 07/07/2016  . Chest pain 07/06/2016  . AKI (acute kidney injury) ([place]) 08/16/2014  . GI bleeding 08/15/2014  . Rectal bleed 08/15/2014  . Heme positive stool 06/22/2014  . Symptomatic anemia 06/21/2014  . Iron deficiency anemia 06/21/2014  . Malnutrition  of moderate degree ([place]) 07/28/2013  . UTI (lower urinary tract infection) 07/26/2013  . HYPERTENSION, BENIGN SYSTEMIC 02/23/2007    Past Surgical History:  Procedure Laterality Date  . COLONOSCOPY N/A 06/22/2014   Procedure: COLONOSCOPY;  Surgeon: Jerene Bears, MD;  Location: [facility] ENDOSCOPY;  Service: Endoscopy;  Laterality: N/A;  . ENDOBRONCHIAL ULTRASOUND N/A 12/31/2016   Procedure: ENDOBRONCHIAL  ULTRASOUND;  Surgeon: Juanito Doom, MD;  Location: [facility] ENDOSCOPY;  Service: Cardiopulmonary;  Laterality: N/A;  . ESOPHAGOGASTRODUODENOSCOPY N/A 06/22/2014   Procedure: ESOPHAGOGASTRODUODENOSCOPY (EGD);  Surgeon: Jerene Bears, MD;  Location: [facility];  Service: Endoscopy;  Laterality: N/A;  . MASTECTOMY Left     OB History    Gravida Para Term Preterm AB Living   10 10 10          SAB TAB Ectopic Multiple Live Births           10       Home Medications    Prior to Admission medications   Medication Sig Start Date End Date Taking? Authorizing Provider  feeding supplement (BOOST / RESOURCE BREEZE) LIQD Take 1 Container by mouth 2 (two) times daily between meals. 05/21/17  Yes Sheikh, [place], DO  feeding supplement, ENSURE ENLIVE, (ENSURE ENLIVE) LIQD Take 237 mLs by mouth daily. 05/21/17  Yes Sheikh, Omair Latif, DO  furosemide (LASIX) 40 MG tablet Take 2 tablets (80 mg total) by mouth daily. 08/30/17  Yes Imogene Burn, PA-C  metoprolol tartrate (LOPRESSOR) 25 MG tablet Take 1 tablet (25 mg total) by mouth 2 (two) times daily. 06/09/16  Yes Burtis Junes, NP  ciprofloxacin (CIPRO) 250 MG tablet Take 1 tablet (250 mg total) by mouth every 12 (twelve) hours. Patient not taking: Reported on 09/21/2017 09/11/17   Daleen Bo, MD    Family History Family History  Problem Relation Age of Onset  . Hypertension Mother   . Cancer Father     Social History Social History  Substance Use Topics  . Smoking status: Current Some Day Smoker    Packs/day: 0.25    Years: 50.00    Types: Cigarettes  . Smokeless tobacco: Former Systems developer    Types: Snuff     Comment: per patient has not used snuff in a while (10/18/16)  . Alcohol use No     Allergies   Penicillins  Review of Systems Review of Systems  All other systems reviewed and are negative.   Physical Exam Updated Vital Signs BP 129/72   Pulse 85   Temp 98.2 F (36.8 C) (Oral)   Resp 19   Wt 57.6 kg (127 lb)    SpO2 99%   BMI 21.80 kg/m   Physical Exam  Constitutional: She is oriented to person, place, and time. She appears well-developed and well-nourished.  HENT:  Head: Normocephalic and atraumatic.  Eyes: Pupils are equal, round, and reactive to light. Conjunctivae are normal. Right eye exhibits no discharge. Left eye exhibits no discharge. No scleral icterus.  Neck: Normal range of motion. No JVD present. No tracheal deviation present.  Cardiovascular:  Irregular rhythm   Pulmonary/Chest: Effort normal. No stridor.  Minimal bilateral lower crackles - no wheeze noted   Musculoskeletal: She exhibits edema.  Bilateral pitting edema with chronic skin changes - knees swollen bilateral with no warmth to touch flexion greater than 90 degrees  Neurological: She is alert and oriented to person, place, and time. Coordination normal.  Psychiatric: She has a normal mood and affect. Her behavior  is normal. Judgment and thought content normal.  Nursing note and vitals reviewed.    ED Treatments / Results  Labs (all labs ordered are listed, but only abnormal results are displayed) Labs Reviewed  CBC WITH DIFFERENTIAL/PLATELET - Abnormal; Notable for the following:       Result Value   RBC 2.85 (*)    Hemoglobin 7.0 (*)    HCT 23.9 (*)    MCH 24.6 (*)    MCHC 29.3 (*)    RDW 20.1 (*)    All other components within normal limits  URINALYSIS, ROUTINE W REFLEX MICROSCOPIC - Abnormal; Notable for the following:    Color, Urine AMBER (*)    APPearance CLOUDY (*)    Hgb urine dipstick SMALL (*)    Protein, ur 100 (*)    Leukocytes, UA LARGE (*)    Bacteria, UA MANY (*)    Squamous Epithelial / LPF 6-30 (*)    All other components within normal limits  BRAIN NATRIURETIC PEPTIDE - Abnormal; Notable for the following:    B Natriuretic Peptide >4,500.0 (*)    All other components within normal limits  BASIC METABOLIC PANEL - Abnormal; Notable for the following:    Potassium 5.2 (*)    BUN 40 (*)     Creatinine, Ser 1.49 (*)    Calcium 8.6 (*)    GFR calc non Af Amer 31 (*)    GFR calc Af Amer 36 (*)    All other components within normal limits  I-STAT TROPONIN, ED - Abnormal; Notable for the following:    Troponin i, poc 0.10 (*)    All other components within normal limits  POC OCCULT BLOOD, ED - Abnormal; Notable for the following:    Fecal Occult Bld POSITIVE (*)    All other components within normal limits  PROTIME-INR  BASIC METABOLIC PANEL  PREPARE RBC (CROSSMATCH)  TYPE AND SCREEN  ABO/RH    EKG  EKG Interpretation  Date/Time:  Wednesday September 21 2017 12:13:00 EDT Ventricular Rate:  80 PR Interval:    QRS Duration: 88 QT Interval:  404 QTC Calculation: 465 R Axis:   -25 Text Interpretation:  Undetermined rhythm Septal infarct , age undetermined T wave abnormality, consider anterior ischemia Abnormal ECG when compared to prior, new bigeminy.  No STEMI Confirmed by Antony Blackbird [phone]) on 09/21/2017 12:21:56 PM       Radiology Dg Chest 2 View  Result Date: 09/21/2017 CLINICAL DATA:  83 year old female with history of abdominal pain radiating into the shoulders. EXAM: CHEST  2 VIEW COMPARISON:  Chest x-ray 09/11/2017. FINDINGS: Lung volumes are low. Diffuse peribronchial cuffing. Patchy areas of interstitial prominence, most evident throughout the right mid to lower lung. No confluent consolidative airspace disease. Small right pleural effusion. Crowding of the pulmonary vasculature without frank pulmonary edema. Heart size appears mildly enlarged. Upper mediastinal contours are distorted by patient positioning. Aortic atherosclerosis. Surgical clips project over the left axillary region, presumably from prior lymph node dissection. IMPRESSION: 1. The appearance of the chest suggests bronchitis, potentially with developing right lower lobe bronchopneumonia. 2. Small right pleural effusion. 3. Mild cardiomegaly. 4. Aortic atherosclerosis. Electronically Signed    By: Vinnie Langton M.D.   On: 09/21/2017 13:22    Procedures Procedures (including critical care time)   CRITICAL CARE Performed by: Elmer Ramp   Total critical care time: 35 minutes  Critical care time was exclusive of separately billable procedures and treating other patients.  Critical care was  necessary to treat or prevent imminent or life-threatening deterioration.  Critical care was time spent personally by me on the following activities: development of treatment plan with patient and/or surrogate as well as nursing, discussions with consultants, evaluation of patient's response to treatment, examination of patient, obtaining history from patient or surrogate, ordering and performing treatments and interventions, ordering and review of laboratory studies, ordering and review of radiographic studies, pulse oximetry and re-evaluation of patient's condition. Medications Ordered in ED Medications  0.9 %  sodium chloride infusion (10 mL/hr Intravenous New Bag/Given 09/21/17 1627)  furosemide (LASIX) injection 60 mg (60 mg Intravenous Given 09/21/17 1622)     Initial Impression / Assessment and Plan / ED Course  I have reviewed the triage vital signs and the nursing notes.  Pertinent labs & imaging results that were available during my care of the patient were reviewed by me and considered in my medical decision making (see chart for details).    Final Clinical Impressions(s) / ED Diagnoses   Final diagnoses:  Anemia, unspecified type  Congestive heart failure, unspecified HF chronicity, unspecified heart failure type (HCC)    Labs: BMP, i-STAT troponin, CBC, UA  Imaging: DG Chest 2 View  Consults: Cardiology, [facility]  Therapeutics: Lasix, packed red blood cells  Discharge Meds:   Assessment/Plan: 83 year old female presents today with complaints of chest pain.  Patient's workup here consistent with acute on chronic diastolic heart failure.  Patient does have  some edema, no signs of respiratory distress.  She is anemic here in the left shoulder pain will require blood transfusion for symptomatic anemia.  Her troponins likely secondary to demand ischemia.  Patient in no acute distress, cardiology consulted who recommended hospital admission for CHF and anemia.  [facility] consulted for hospital admission.    New Prescriptions New Prescriptions   No medications on file     Okey Regal, Hershal Coria 09/21/17 1704    Tegeler, Gwenyth Allegra, MD 09/22/17 1025

## 2017-09-22 ENCOUNTER — Encounter (HOSPITAL_COMMUNITY): Payer: Self-pay | Admitting: General Practice

## 2017-09-22 DIAGNOSIS — I509 Heart failure, unspecified: Secondary | ICD-10-CM

## 2017-09-22 DIAGNOSIS — N183 Chronic kidney disease, stage 3 (moderate): Secondary | ICD-10-CM

## 2017-09-22 DIAGNOSIS — N390 Urinary tract infection, site not specified: Secondary | ICD-10-CM

## 2017-09-22 LAB — URINE CULTURE

## 2017-09-22 LAB — MAGNESIUM: MAGNESIUM: 2 mg/dL (ref 1.7–2.4)

## 2017-09-22 LAB — CBC
HEMATOCRIT: 33.9 % — AB (ref 36.0–46.0)
HEMOGLOBIN: 9.9 g/dL — AB (ref 12.0–15.0)
MCH: 24.6 pg — ABNORMAL LOW (ref 26.0–34.0)
MCHC: 29.2 g/dL — AB (ref 30.0–36.0)
MCV: 84.1 fL (ref 78.0–100.0)
Platelets: 254 10*3/uL (ref 150–400)
RBC: 4.03 MIL/uL (ref 3.87–5.11)
RDW: 18.9 % — ABNORMAL HIGH (ref 11.5–15.5)
WBC: 15.9 10*3/uL — ABNORMAL HIGH (ref 4.0–10.5)

## 2017-09-22 LAB — BASIC METABOLIC PANEL
ANION GAP: 12 (ref 5–15)
BUN: 46 mg/dL — ABNORMAL HIGH (ref 6–20)
CHLORIDE: 101 mmol/L (ref 101–111)
CO2: 21 mmol/L — AB (ref 22–32)
Calcium: 8.8 mg/dL — ABNORMAL LOW (ref 8.9–10.3)
Creatinine, Ser: 1.81 mg/dL — ABNORMAL HIGH (ref 0.44–1.00)
GFR calc non Af Amer: 24 mL/min — ABNORMAL LOW (ref >60)
GFR, EST AFRICAN AMERICAN: 28 mL/min — AB (ref >60)
Glucose, Bld: 139 mg/dL — ABNORMAL HIGH (ref 65–99)
POTASSIUM: 5.1 mmol/L (ref 3.5–5.1)
SODIUM: 134 mmol/L — AB (ref 135–145)

## 2017-09-22 LAB — BPAM RBC
Blood Product Expiration Date: 201810182359
Blood Product Expiration Date: 201810182359
ISSUE DATE / TIME: 201809261608
ISSUE DATE / TIME: 201809261932
Unit Type and Rh: 5100
Unit Type and Rh: 5100

## 2017-09-22 LAB — TYPE AND SCREEN
ABO/RH(D): O POS
Antibody Screen: NEGATIVE
Unit division: 0
Unit division: 0

## 2017-09-22 LAB — PHOSPHORUS: Phosphorus: 5.2 mg/dL — ABNORMAL HIGH (ref 2.5–4.6)

## 2017-09-22 MED ORDER — ENSURE ENLIVE PO LIQD
237.0000 mL | Freq: Two times a day (BID) | ORAL | Status: DC
  Administered 2017-09-23 – 2017-10-01 (×6): 237 mL via ORAL

## 2017-09-22 MED ORDER — FUROSEMIDE 40 MG PO TABS
40.0000 mg | ORAL_TABLET | Freq: Two times a day (BID) | ORAL | Status: DC
  Administered 2017-09-22 – 2017-09-23 (×2): 40 mg via ORAL
  Filled 2017-09-22 (×2): qty 1

## 2017-09-22 NOTE — Consult Note (Signed)
   [facility] Inpatient Consult   09/22/2017  CHANI GHANEM [birth year] [id number]  Patient assessed for re-admission in the [place] [facility].  Patient admitted with HF exacerbation.  Met patient at the bedside and she is very short of breath and coughing.  She states she is not feeling well right now and states she probably needs some blood but, states she is interested in [employer] program.  Will follow up at a more appropriate time  For questions, please contact:  Natividad Brood, RN BSN [facility] Liaison  [phone] business mobile phone Toll free office [phone]

## 2017-09-22 NOTE — Evaluation (Signed)
Occupational Therapy Evaluation Patient Details Name: Valerie Downs MRN: [id number] DOB: [birth year] Today's Date: 09/22/2017    History of Present Illness  84 y.o. female with medical history aortic insufficiency, mitral regurgitation, chronic blood loss anemia, chronic diastolic CHF, hypertension, chronic kidney disease, tobacco abuse  significant of breast cancer (s/p mastectomy 1984) , ovarian mass and enlarging lung mass /LAN  admitted wtih LE edema and dyspnea. Dx of acute on chronic CHF.   Clinical Impression   Pt admitted with the above diagnosis and has the deficits listed below. Pt would benefit from cont OT to increase independence with basic adls and efficiency with adls so she can continue to assist with her adls at home.  At current level, pt will need 24/7 S and assist to continue once she leaves the hospital.  Pt with STM deficits and is easily confused with questioning and history.    Follow Up Recommendations  Home health OT;Supervision/Assistance - 24 hour    Equipment Recommendations  Tub/shower bench;3 in 1 bedside commode    Recommendations for Other Services       Precautions / Restrictions Precautions Precautions: Fall Precaution Comments: pt denies falls at home Restrictions Weight Bearing Restrictions: No      Mobility Bed Mobility               General bed mobility comments: pt in chair on arrival.  Transfers Overall transfer level: Needs assistance Equipment used: Rolling walker (2 wheeled) Transfers: Sit to/from Stand Sit to Stand: Min assist         General transfer comment: From low recliner, pt required min assist to get started.    Balance Overall balance assessment: Needs assistance Sitting-balance support: Feet supported Sitting balance-Leahy Scale: Good     Standing balance support: Bilateral upper extremity supported;During functional activity Standing balance-Leahy Scale: Fair Standing balance comment: PT must have outside  support to walk but wtih walker did not lose balance.                           ADL either performed or assessed with clinical judgement   ADL Overall ADL's : Needs assistance/impaired Eating/Feeding: Set up;Sitting   Grooming: Min guard;Oral care;Wash/dry face;Wash/dry hands;Standing;Cueing for safety   Upper Body Bathing: Set up;Sitting   Lower Body Bathing: Minimal assistance;Sit to/from stand;Cueing for safety Lower Body Bathing Details (indicate cue type and reason): Pt can reach B feet by crossing legs up with encouragement. Pt required min assist to get to feet from low recliner but could was backside in standing with min guard. Upper Body Dressing : Set up;Sitting   Lower Body Dressing: Minimal assistance;Sit to/from stand;Cueing for compensatory techniques Lower Body Dressing Details (indicate cue type and reason): cues to cross legs to reach feet  Toilet Transfer: Minimal assistance;Ambulation;Comfort height toilet;Grab bars;RW Toilet Transfer Details (indicate cue type and reason): pt walked to bathroom with min guard. Toileting- Clothing Manipulation and Hygiene: Minimal assistance;Sit to/from stand Toileting - Clothing Manipulation Details (indicate cue type and reason): min assist for balance when standing to clean self.     Functional mobility during ADLs: Min guard;Rolling walker General ADL Comments: Pt fatigues quickly with adls but is able to do them.  Pt states she has some help at home most days.     Vision Baseline Vision/History:  (has glasses but does not wear them.) Patient Visual Report: No change from baseline Vision Assessment?: No apparent visual deficits  Perception     Praxis      Pertinent Vitals/Pain Pain Assessment: No/denies pain     Hand Dominance Right   Extremity/Trunk Assessment Upper Extremity Assessment Upper Extremity Assessment: Generalized weakness   Lower Extremity Assessment Lower Extremity Assessment: Defer  to PT evaluation   Cervical / Trunk Assessment Cervical / Trunk Assessment: Kyphotic   Communication Communication Communication: No difficulties   Cognition Arousal/Alertness: Awake/alert Behavior During Therapy: WFL for tasks assessed/performed Overall Cognitive Status: No family/caregiver present to determine baseline cognitive functioning                                 General Comments: Pt with STM deficits and often had conflicting statements during 20 minute period.  Pt no longer drives and daughter states she has memory deficits.   General Comments  Pt swelling and dry skin to B ankles. B legs reclined and lotion applied.    Exercises     Shoulder Instructions      Home Living Family/patient expects to be discharged to:: Private residence Living Arrangements: Children Available Help at Discharge: Family;Available 24 hours/day Type of Home: House Home Access: Stairs to enter [employer] of Steps: 6 Entrance Stairs-Rails: None Home Layout: One level     Bathroom Shower/Tub: Tub/shower unit;Curtain   Biochemist, clinical: Standard     Home Equipment: Environmental consultant - 2 wheels;Walker - 4 wheels;Kasandra Knudsen - single point   Additional Comments: Talked to oldest daughter in [place] bc could not get a hold of Katharine Look.  Daughter states her other daughter and niece are with pt 24/7 to assist her with adls.      Prior Functioning/Environment Level of Independence: Needs assistance  Gait / Transfers Assistance Needed: assistance for stairs, supervision bathing/dressing, walks independently with RW ADL's / Homemaking Assistance Needed: family members do most of cooking and cleaning.   Comments: uses RW        OT Problem List: Decreased activity tolerance;Decreased strength;Decreased knowledge of use of DME or AE      OT Treatment/Interventions: Self-care/ADL training;Energy conservation;Therapeutic activities;DME and/or AE instruction    OT Goals(Current goals can  be found in the care plan section) Acute Rehab OT Goals Patient Stated Goal: to have more energy OT Goal Formulation: With patient Time For Goal Achievement: 10/06/17 Potential to Achieve Goals: Good ADL Goals Pt Will Perform Grooming: with supervision;standing Pt Will Perform Lower Body Bathing: with supervision;sit to/from stand Pt Will Perform Lower Body Dressing: with supervision;sit to/from stand Additional ADL Goal #1: Pt will walk to bathroom with walker and toilet with S. Additional ADL Goal #2: Pt will state  3 things she can do at home to conserve more energy while doing adls with min VCs.  OT Frequency: Min 2X/week   Barriers to D/C:    oldest daughter states there is 24/7 supervision available.       Co-evaluation              AM-PAC PT "6 Clicks" Daily Activity     Outcome Measure Help from another person eating meals?: None Help from another person taking care of personal grooming?: None Help from another person toileting, which includes using toliet, bedpan, or urinal?: A Little Help from another person bathing (including washing, rinsing, drying)?: A Little Help from another person to put on and taking off regular upper body clothing?: None Help from another person to put on and taking off regular lower body  clothing?: A Little 6 Click Score: 21   End of Session Equipment Utilized During Treatment: Surveyor, mining Communication: Mobility status  Activity Tolerance: Patient limited by fatigue Patient left: in chair;with call bell/phone within reach;with chair alarm set  OT Visit Diagnosis: Unsteadiness on feet (R26.81);Other symptoms and signs involving cognitive function;Other abnormalities of gait and mobility (R26.89)                Time: 6004-5997 OT Time Calculation (min): 21 min Charges:  OT General Charges $OT Visit: 1 Visit OT Evaluation $OT Eval Moderate Complexity: 1 Mod G-Codes:     Olen Pel 09/22/2017, 10:54  AM

## 2017-09-22 NOTE — Progress Notes (Signed)
Initial Nutrition Assessment  DOCUMENTATION CODES:   Severe malnutrition in context of chronic illness  INTERVENTION:  Ensure Enlive po BID, each supplement provides 350 kcal and 20 grams of protein   Continue Boost Breeze po BID, each supplement provides 250 kcal and 9 grams of protein  NUTRITION DIAGNOSIS:   Malnutrition (severe) related to chronic illness (CHF, CKD) as evidenced by severe depletion of muscle mass, severe depletion of body fat.  GOAL:   Patient will meet greater than or equal to 90% of their needs  MONITOR:   PO intake, Supplement acceptance, Weight trends, I & O's  REASON FOR ASSESSMENT:   Consult Assessment of nutrition requirement/status  ASSESSMENT:   Pt with PMH of CHF, CKD stage III, HTN, aortic insufficiency, hx of breast cancer (s/p mastectomy 1984), anemia, mitral regurgitation presents with symptomatic anemia  Pt is reported poor historian. Discussed pt with RN. RN reports pt has been taking Ensure down well, unsure of PO intake.  Pt reports she did not consume all of  breakfast this morning because "it was cold". Per chart review pt consumed 0% of this meal. Pt states she had fair po intake PTA.   Pt reports gaining a few pounds, however weight history is unreliable given fluid status fluctuations. Pt reports drinking Ensure at home and amenable to supplementation while admitted. Pt denies N/V, constipation, or diarrhea at this time.    Used pt's recent admit weight given fluid fluctuations (57.8 kg) Pt s/p 2 units PRBC ordered by GDP for anemia, per RN note pt refused blood draw this morning. Care team is monitoring pt's I/Os and daily weights  Labs reviewed; Potassium 5.2, Hemoglobin 7.0  Medications reviewed; 250 mL IV NaCl, Lasix   Nutrition focused physical exam completed. Findings include severe muscle depletion, severe fat depletion, and unable to assess edema.   Diet Order:  Diet regular Room service appropriate? Yes; Fluid  consistency: Thin  Skin:  Reviewed, no issues  Last BM:  09/21/17  Height:   Ht Readings from Last 1 Encounters:  09/21/17 5\' 1"  (1.549 m)    Weight:   Wt Readings from Last 1 Encounters:  09/22/17 140 lb 9.6 oz (63.8 kg)    Ideal Body Weight:  47.7 kg  BMI:  Body mass index is 26.57 kg/m.  Estimated Nutritional Needs:   Kcal:  1400-1600  Protein:  70-85 grams  Fluid:  >/= 1.4 L/d  EDUCATION NEEDS:   Education needs no appropriate at this time  Parks Ranger, MS, RDN, LDN 09/22/2017 12:19 PM

## 2017-09-22 NOTE — Progress Notes (Signed)
Pt refused  blood draw,explained the importance of blood works but pt continued to refused. MD updated

## 2017-09-22 NOTE — Evaluation (Signed)
Physical Therapy Evaluation Patient Details Name: Valerie Downs MRN: [id number] DOB: [birth year] Today's Date: 09/22/2017   History of Present Illness   83 y.o. female with medical history aortic insufficiency, mitral regurgitation, chronic blood loss anemia, chronic diastolic CHF, hypertension, chronic kidney disease, tobacco abuse  significant of breast cancer (s/p mastectomy 1984) , ovarian mass and enlarging lung mass /LAN  admitted wtih LE edema and dyspnea. Dx of acute on chronic CHF.  Clinical Impression  Pt admitted with above diagnosis. Pt currently with functional limitations due to the deficits listed below (see PT Problem List). Pt was able to take a short walk with RW with min guard assist.  Needs min assist to power up. Will have 24 hour care on d/c.  HHPT recommended.  Pt will benefit from skilled PT to increase their independence and safety with mobility to allow discharge to the venue listed below.      Follow Up Recommendations Home health PT;Supervision/Assistance - 24 hour    Equipment Recommendations  None recommended by PT    Recommendations for Other Services       Precautions / Restrictions Precautions Precautions: Fall Precaution Comments: pt denies falls at home Restrictions Weight Bearing Restrictions: No      Mobility  Bed Mobility Overal bed mobility: Needs Assistance Bed Mobility: Supine to Sit     Supine to sit: Mod assist     General bed mobility comments: Needed incr time to come to EOB.  Pt wet with urine as Purewick not in place on arrival.    Transfers Overall transfer level: Needs assistance Equipment used: Rolling walker (2 wheeled) Transfers: Sit to/from Stand Sit to Stand: Min assist         General transfer comment: From low recliner, pt required min assist to get started.  Have to have assist to power up.   Ambulation/Gait Ambulation/Gait assistance: Min guard;Min assist;+2 safety/equipment Ambulation Distance (Feet): 35  Feet Assistive device: Rolling walker (2 wheeled) Gait Pattern/deviations: Step-through pattern;Decreased stride length   Gait velocity interpretation: Below normal speed for age/gender General Gait Details: Pt ambulated with min guard assist with RW with limited distance as pt states she can't go too far.  Needed some cues for safety but overall did well with RW.    Stairs            Wheelchair Mobility    Modified Rankin (Stroke Patients Only)       Balance Overall balance assessment: Needs assistance Sitting-balance support: Feet supported Sitting balance-Leahy Scale: Good     Standing balance support: Bilateral upper extremity supported;During functional activity Standing balance-Leahy Scale: Fair Standing balance comment: Pt must have outside support to walk but wtih walker did not lose balance.                             Pertinent Vitals/Pain Pain Assessment: No/denies pain  94-96% on RA, 158/72, 81 bpm  Home Living Family/patient expects to be discharged to:: Private residence Living Arrangements: Children Available Help at Discharge: Family;Available 24 hours/day Type of Home: House Home Access: Stairs to enter Entrance Stairs-Rails: None Entrance Stairs-Number of Steps: 6 Home Layout: One level Home Equipment: Walker - 2 wheels;Walker - 4 wheels;Kasandra Knudsen - single point Additional Comments: Talked to oldest daughter in [place] bc could not get a hold of Katharine Look.  Daughter states her other daughter and niece are with pt 24/7 to assist her with adls.    Prior Function  Level of Independence: Needs assistance   Gait / Transfers Assistance Needed: assistance for stairs, supervision bathing/dressing, walks independently with RW  ADL's / Homemaking Assistance Needed: family members do most of cooking and cleaning.  Comments: uses RW     Hand Dominance   Dominant Hand: Right    Extremity/Trunk Assessment   Upper Extremity Assessment Upper Extremity  Assessment: Defer to OT evaluation    Lower Extremity Assessment Lower Extremity Assessment: Generalized weakness    Cervical / Trunk Assessment Cervical / Trunk Assessment: Kyphotic  Communication   Communication: No difficulties  Cognition Arousal/Alertness: Awake/alert Behavior During Therapy: WFL for tasks assessed/performed Overall Cognitive Status: No family/caregiver present to determine baseline cognitive functioning                                 General Comments: Pt with STM deficits and often had conflicting statements during 20 minute period.  Pt no longer drives and daughter states she has memory deficits.      General Comments General comments (skin integrity, edema, etc.): Refused exercise stating, "I am tired."    Exercises     Assessment/Plan    PT Assessment Patient needs continued PT services  PT Problem List Decreased activity tolerance;Decreased balance;Decreased mobility;Decreased knowledge of use of DME;Decreased safety awareness;Decreased knowledge of precautions       PT Treatment Interventions DME instruction;Gait training;Functional mobility training;Therapeutic activities;Therapeutic exercise;Balance training;Patient/family education    PT Goals (Current goals can be found in the Care Plan section)  Acute Rehab PT Goals Patient Stated Goal: to have more energy PT Goal Formulation: With patient Time For Goal Achievement: 10/06/17 Potential to Achieve Goals: Good    Frequency Min 3X/week   Barriers to discharge        Co-evaluation               AM-PAC PT "6 Clicks" Daily Activity  Outcome Measure Difficulty turning over in bed (including adjusting bedclothes, sheets and blankets)?: A Lot Difficulty moving from lying on back to sitting on the side of the bed? : A Lot Difficulty sitting down on and standing up from a chair with arms (e.g., wheelchair, bedside commode, etc,.)?: A Lot Help needed moving to and from a  bed to chair (including a wheelchair)?: A Lot Help needed walking in hospital room?: A Little Help needed climbing 3-5 steps with a railing? : Total 6 Click Score: 12    End of Session Equipment Utilized During Treatment: Gait belt Activity Tolerance: Patient limited by fatigue Patient left: in chair;with call bell/phone within reach;with chair alarm set Nurse Communication: Mobility status PT Visit Diagnosis: Unsteadiness on feet (R26.81);Muscle weakness (generalized) (M62.81)    Time: 4481-8563 PT Time Calculation (min) (ACUTE ONLY): 31 min   Charges:   PT Evaluation $PT Eval Moderate Complexity: 1 Mod PT Treatments $Gait Training: 8-22 mins   PT G Codes:        Royston Bekele,PT Acute Rehabilitation [phone] (pager)   Denice Paradise 09/22/2017, 2:28 PM

## 2017-09-22 NOTE — Progress Notes (Signed)
The patient has been seen in conjunction with Vin Bhagat, PAC. All aspects of care have been considered and discussed. The patient has been personally interviewed, examined, and all clinical data has been reviewed.  Agree with current note as written. Nothing further to add.   Progress Note  Patient Name: Valerie Downs Date of Encounter: 09/22/2017  Primary Cardiologist: Dr Angelena Form  Subjective   Still has shortness of breath. No chest pain.   Inpatient Medications    Scheduled Meds: . feeding supplement  1 Container Oral BID BM  . furosemide  60 mg Intravenous Q12H  . metoprolol tartrate  25 mg Oral BID  . sodium chloride flush  3 mL Intravenous Q12H   Continuous Infusions: . sodium chloride    . cefTRIAXone (ROCEPHIN)  IV Stopped (09/21/17 2149)   PRN Meds: sodium chloride, acetaminophen **OR** acetaminophen, ondansetron **OR** ondansetron (ZOFRAN) IV, sodium chloride flush   Vital Signs    Vitals:   09/21/17 1956 09/21/17 2246 09/22/17 0014 09/22/17 0442  BP: (!) 149/58 (!) 154/50 (!) 152/62 (!) 149/86  Pulse: 76 76 72 83  Resp: (!) 24 (!) 24 18 18   Temp: 98.2 F (36.8 C) 97.8 F (36.6 C)  98.7 F (37.1 C)  TempSrc: Tympanic Oral  Oral  SpO2: 99% 100% 100% 100%  Weight:    140 lb 9.6 oz (63.8 kg)  Height:        Intake/Output Summary (Last 24 hours) at 09/22/17 1014 Last data filed at 09/22/17 9629  Gross per 24 hour  Intake              495 ml  Output                2 ml  Net              493 ml   Filed Weights   09/21/17 1118 09/21/17 1849 09/22/17 0442  Weight: 127 lb (57.6 kg) 138 lb 8 oz (62.8 kg) 140 lb 9.6 oz (63.8 kg)    Telemetry    Sinus rhythm with  bigeminy  - Personally Reviewed  ECG    None today  - Personally Reviewed  Physical Exam   GEN: Chronically ill appearing elderly female sitting in recliner. No acute distress.   Neck: + JVD Cardiac: Irregular, + systolic and diastolic murmurs, rubs, or gallops. 1-2+ BL Le edema with  lichenification Respiratory: Clear to auscultation bilaterally. GI: Soft, nontender, non-distended  MS: No edema; No deformity. Neuro:  Nonfocal  Psych: Normal affect   Labs    Chemistry Recent Labs Lab 09/21/17 1547  NA 135  K 5.2*  CL 105  CO2 22  GLUCOSE 94  BUN 40*  CREATININE 1.49*  CALCIUM 8.6*  GFRNONAA 31*  GFRAA 36*  ANIONGAP 8     Hematology Recent Labs Lab 09/21/17 1119  WBC 8.5  RBC 2.85*  HGB 7.0*  HCT 23.9*  MCV 83.9  MCH 24.6*  MCHC 29.3*  RDW 20.1*  PLT 298    Cardiac EnzymesNo results for input(s): TROPONINI in the last 168 hours.  Recent Labs Lab 09/21/17 1143  TROPIPOC 0.10*     BNP Recent Labs Lab 09/21/17 1223  BNP >4,500.0*      Radiology    Dg Chest 2 View  Result Date: 09/21/2017 CLINICAL DATA:  83 year old female with history of abdominal pain radiating into the shoulders. EXAM: CHEST  2 VIEW COMPARISON:  Chest x-ray 09/11/2017. FINDINGS: Lung volumes are low.  Diffuse peribronchial cuffing. Patchy areas of interstitial prominence, most evident throughout the right mid to lower lung. No confluent consolidative airspace disease. Small right pleural effusion. Crowding of the pulmonary vasculature without frank pulmonary edema. Heart size appears mildly enlarged. Upper mediastinal contours are distorted by patient positioning. Aortic atherosclerosis. Surgical clips project over the left axillary region, presumably from prior lymph node dissection. IMPRESSION: 1. The appearance of the chest suggests bronchitis, potentially with developing right lower lobe bronchopneumonia. 2. Small right pleural effusion. 3. Mild cardiomegaly. 4. Aortic atherosclerosis. Electronically Signed   By: Vinnie Langton M.D.   On: 09/21/2017 13:22    Cardiac Studies   Eco 08/21/17 Study Conclusions  - Left ventricle: The cavity size was normal. There was severe   focal basal and moderate concentric hypertrophy. Systolic   function was normal. The  estimated ejection fraction was in the   range of 55% to 60%. Wall motion was normal; there were no   regional wall motion abnormalities. Features are consistent with   a pseudonormal left ventricular filling pattern, with concomitant   abnormal relaxation and increased filling pressure (grade 2   diastolic dysfunction). Doppler parameters are consistent with   high ventricular filling pressure. - Aortic valve: Trileaflet; moderately thickened, mildly calcified   leaflets. There was moderate regurgitation. - Aorta: Ascending aorta diameter: 41 mm (ED). - Ascending aorta: The ascending aorta was mildly dilated. - Mitral valve: Calcified annulus. There was moderate regurgitation   directed eccentrically and toward the septum. - Left atrium: The atrium was severely dilated. - Right ventricle: The cavity size was mildly dilated. Wall   thickness was normal. Systolic function was mildly reduced. - Right atrium: The atrium was severely dilated. - Pulmonic valve: There was moderate regurgitation. - Pulmonary arteries: PA peak pressure: 60 mm Hg (S). - Pericardium, extracardiac: A small, free-flowing pericardial   effusion was identified circumferential to the heart. The fluid   had no internal echoes.  Impressions:  - The right ventricular systolic pressure was increased consistent   with moderate pulmonary hypertension.  Patient Profile     83 y.o. 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   Assessment & Plan    1. Acute on chronic diastolic CHF - BNP > 8756 in setting of acute anemia. Hgb 7 and positive stool guaiac. I & O inaccurate due to incontinence and UTI. Continue IV lasix at current dose. Renal function  improved. - Continue BB.   2. Elevated troponin - Demand in setting of acute anemia and CHF.  3. Recurrent symptomatic anemia - Contributing to her dyspnea. Evaluation per primary team.   4 Acute on chronic kidney disease, stage III - Scr improved with diuresis  Dispo: She remained full code. Recommended discussion of goal of care.   For questions or updates, please contact [facility] Please consult [email or web address] for contact info under Cardiology/STEMI.      Signed, Leanor Kail, PA  09/22/2017, 10:14 AM

## 2017-09-22 NOTE — Progress Notes (Signed)
VSS 7 a to 7 p shift, patient without complaint other than being cold.  Up to [facility] and sat in chair with one assist.  Remained on room air entire shift with oxygen saturation in the 90's.  Son in to visit during this shift.

## 2017-09-22 NOTE — Care Management Note (Signed)
Case Management Note  Patient Details  Name: Valerie Downs MRN: [id number] Date of Birth: [birth year]  Subjective/Objective:     Symptomatic Anemia              Action/Plan: Patient lives at home; PCP: Golden Circle, FNP; has private insurance with [facility] with prescription drug coverage; CM following for DCP.  Expected Discharge Date:   possibly 09/25/2017               Expected Discharge Plan: Possibly [place]; awaiting on Physical Therapy Eval  In-House Referral:   [facility]  Discharge planning Services  CM Consult  Status of Service:  In process, will continue to follow  Sherrilyn Rist [phone] 09/22/2017, 1:45 PM

## 2017-09-22 NOTE — Plan of Care (Signed)
Problem: Tissue Perfusion: Goal: Risk factors for ineffective tissue perfusion will decrease Outcome: Progressing Maintains oxygen level in the 90's on room air   Problem: Activity: Goal: Risk for activity intolerance will decrease Outcome: Progressing Up to chair and BSC with one assist   Problem: Nutrition: Goal: Adequate nutrition will be maintained Outcome: Progressing Patient drinking Ensure

## 2017-09-22 NOTE — Progress Notes (Signed)
PROGRESS NOTE    Valerie Downs  PJK:[id number] DOB: 01-05-1930 DOA: 09/21/2017 PCP: Golden Circle, FNP    Brief Narrative:  83 year old female who presented with weakness. Patient does have significant past medical history of aortic insufficiency, mitral regurgitation, chronic blood loss anemia, diastolic heart failure, hypertension, chronic kidney disease and tobacco abuse. She complained of 3 days of worsening dyspnea, associated with lower extremity edema and orthopnea, no chest pain or frank angina. On initial physical examination 141/60, heart rate 75, respiratory 25, temperature 98.4, oxygen saturation 100%, moist mucous membranes, moderate JVD, lungs with decreased breath sounds bilaterally, bibasilar rales, heart S1-S2 present and rhythmic, positive systolic murmur 3/6, at the right parasternal region, no radiation, abdomen soft nontender, lower extremities with 3+ pitting edema bilaterally. Sodium 135, potassium 5.2, chloride 105, bicarbonate 22, glucose 94, BUN 40, creatinine 1.49, calcium 8.6, white count 15.9, hemoglobin 9.9, hematocrit 50.9, platelets 254, urinalysis with too numerous count white cells, 6-30 squamous epithelial cells, 100 protein, specific gravity 1.015, fecal occult was positive, INR 1.1, chest x-ray hypoinflated, cephalization of the vasculature, congested hilar regions, EKG sinus, rate of 80 bpm, left axis deviation, poor R-wave progression, positive PVCs in a bigeminy pattern.   Patient was admitted to hospital working diagnosis of decompensated heart failure, complicated by urinary tract infection present on admission.  Assessment & Plan:   Active Problems:   HYPERTENSION, BENIGN SYSTEMIC   Lower urinary tract infectious disease   Symptomatic anemia   Elevated troponin   Acute on chronic diastolic CHF (congestive heart failure) (HCC)   CKD (chronic kidney disease), stage III   1. Decompensated diastolic heart failure, acute on chronic. Patient has  responded well to furosemide, improved lower extremity edema, will continue diuresis with oral furosemide and will continue to follow up on urine output, strict in and outs and daily weights. Will continue heart failure regimen with metoprolol will hold on ace inh due to risk of worsening renal function. Blood pressure 809 to 983 systolic.   2. Urinary tract infection, present on admission. Continue antibiotic therapy with ceftriaxone, will follow on cell count, cultures and temperature curve.  3. Chronic kidney disease stage 3 with base cr 1.4 and calculated GFR 40. Renal function with serum cr up to 1,8 with K 5,1 and serum bicarbonate at 21, will continue furosemide po and follow renal panel in am. The serum cr has increased from 1,49 to 1,81. Will avoid hypotension or nephrotoxic medications.       DVT prophylaxis: heparin  Code Status: full Family Communication: I spoke with patient's son at the bedside and all questions were addressed Disposition Plan: home   Consultants:     Procedures:     Antimicrobials:   Ceftriaxone     Subjective: Patient feeling better, improved lower extremity edema but persistent dyspnea and weakness, no nausea or vomiting, no chest pain, or abdominal pain.   Objective: Vitals:   09/21/17 2246 09/22/17 0014 09/22/17 0442 09/22/17 1045  BP: (!) 154/50 (!) 152/62 (!) 149/86 131/64  Pulse: 76 72 83 82  Resp: (!) 24 18 18    Temp: 97.8 F (36.6 C)  98.7 F (37.1 C)   TempSrc: Oral  Oral   SpO2: 100% 100% 100% 98%  Weight:   63.8 kg (140 lb 9.6 oz)   Height:        Intake/Output Summary (Last 24 hours) at 09/22/17 1255 Last data filed at 09/22/17 1106  Gross per 24 hour  Intake  495 ml  Output              102 ml  Net              393 ml   Filed Weights   09/21/17 1118 09/21/17 1849 09/22/17 0442  Weight: 57.6 kg (127 lb) 62.8 kg (138 lb 8 oz) 63.8 kg (140 lb 9.6 oz)    Examination:  General: deconditoned Neurology:  Awake and alert, non focal  E ENT: mild pallor, no icterus, oral mucosa moist Cardiovascular: Modrate JVD. S1-S2 present, rhythmic, no gallops, rubs, systolic 3/6 murmurs, right parasternal with no radiation. trace lower extremity edema. Pulmonary: vesicular breath sounds bilaterally, adequate air movement, no wheezing, rhonchi or rales. Gastrointestinal. Abdomen flat, no organomegaly, non tender, no rebound or guarding Skin. No rashes Musculoskeletal: no joint deformities     Data Reviewed: I have personally reviewed following labs and imaging studies  CBC:  Recent Labs Lab 09/21/17 1119  WBC 8.5  NEUTROABS 6.4  HGB 7.0*  HCT 23.9*  MCV 83.9  PLT 557   Basic Metabolic Panel:  Recent Labs Lab 09/21/17 1547  NA 135  K 5.2*  CL 105  CO2 22  GLUCOSE 94  BUN 40*  CREATININE 1.49*  CALCIUM 8.6*   GFR: Estimated Creatinine Clearance: 23.6 mL/min (A) (by C-G formula based on SCr of 1.49 mg/dL (H)). Liver Function Tests: No results for input(s): AST, ALT, ALKPHOS, BILITOT, PROT, ALBUMIN in the last 168 hours. No results for input(s): LIPASE, AMYLASE in the last 168 hours. No results for input(s): AMMONIA in the last 168 hours. Coagulation Profile:  Recent Labs Lab 09/21/17 1240  INR 1.11   Cardiac Enzymes: No results for input(s): CKTOTAL, CKMB, CKMBINDEX, TROPONINI in the last 168 hours. BNP (last 3 results)  Recent Labs  04/01/17 1142 04/28/17 1054  PROBNP 31,525* 3,325.0*   HbA1C: No results for input(s): HGBA1C in the last 72 hours. CBG: No results for input(s): GLUCAP in the last 168 hours. Lipid Profile: No results for input(s): CHOL, HDL, LDLCALC, TRIG, CHOLHDL, LDLDIRECT in the last 72 hours. Thyroid Function Tests: No results for input(s): TSH, T4TOTAL, FREET4, T3FREE, THYROIDAB in the last 72 hours. Anemia Panel: No results for input(s): VITAMINB12, FOLATE, FERRITIN, TIBC, IRON, RETICCTPCT in the last 72 hours.    Radiology Studies: I have  reviewed all of the imaging during this hospital visit personally     Scheduled Meds: . feeding supplement  1 Container Oral BID BM  . feeding supplement (ENSURE ENLIVE)  237 mL Oral BID  . furosemide  60 mg Intravenous Q12H  . metoprolol tartrate  25 mg Oral BID  . sodium chloride flush  3 mL Intravenous Q12H   Continuous Infusions: . sodium chloride    . cefTRIAXone (ROCEPHIN)  IV Stopped (09/21/17 2149)     LOS: 1 day        Mauricio Gerome Apley, MD [facility] Pager [phone]

## 2017-09-23 DIAGNOSIS — R748 Abnormal levels of other serum enzymes: Secondary | ICD-10-CM

## 2017-09-23 DIAGNOSIS — I10 Essential (primary) hypertension: Secondary | ICD-10-CM

## 2017-09-23 NOTE — Progress Notes (Signed)
The patient has been seen in conjunction with Kerin Ransom, PA-C. All aspects of care have been considered and discussed. The patient has been personally interviewed, examined, and all clinical data has been reviewed.   Concerned that despite attempts at diuresis urine output has been relatively meager and kidney function is worsening. Volume status is difficult to assess.  Kidney function needs to be followed closely. If kidney function continues to deteriorate, diuretics will need to be held.  Hemoglobin is now 9.  Overall, the clinical situation is worse in in this elderly female with multiple significant co-morbidities.  An invasive strategy is not being considered.  Progress Note  Patient Name: Valerie Downs Date of Encounter: 09/23/2017  Primary Cardiologist: Dr Angelena Form  Subjective   Still has shortness of breath. No chest pain.   Inpatient Medications    Scheduled Meds: . feeding supplement  1 Container Oral BID BM  . feeding supplement (ENSURE ENLIVE)  237 mL Oral BID  . furosemide  40 mg Oral BID  . metoprolol tartrate  25 mg Oral BID  . sodium chloride flush  3 mL Intravenous Q12H   Continuous Infusions: . sodium chloride    . cefTRIAXone (ROCEPHIN)  IV Stopped (09/22/17 1908)   PRN Meds: sodium chloride, acetaminophen **OR** acetaminophen, ondansetron **OR** ondansetron (ZOFRAN) IV, sodium chloride flush   Vital Signs    Vitals:   09/23/17 0258 09/23/17 0321 09/23/17 0815 09/23/17 0909  BP:  126/66 (!) 113/53   Pulse:  71 66   Resp:  18 18   Temp:  97.9 F (36.6 C) 97.8 F (36.6 C)   TempSrc:  Oral Oral   SpO2:  97% 94% 99%  Weight: 139 lb 4.8 oz (63.2 kg)     Height:        Intake/Output Summary (Last 24 hours) at 09/23/17 1003 Last data filed at 09/23/17 0650  Gross per 24 hour  Intake              460 ml  Output              200 ml  Net              260 ml   Filed Weights   09/21/17 1849 09/22/17 0442 09/23/17 0258  Weight: 138 lb 8 oz  (62.8 kg) 140 lb 9.6 oz (63.8 kg) 139 lb 4.8 oz (63.2 kg)    Telemetry    Sinus rhythm  - Personally Reviewed  ECG    None today  - Personally Reviewed  Physical Exam   GEN: Chronically ill appearing elderly female seen in bed. No acute distress.   Neck: + JVD Cardiac: RRR without murmur, trace LE edema Respiratory: Clear to auscultation bilaterally. GI: Soft, nontender, non-distended  MS:  No deformity. Neuro:  Nonfocal  Psych: Normal affect   Labs    Chemistry  Recent Labs Lab 09/21/17 1547 09/22/17 1204  NA 135 134*  K 5.2* 5.1  CL 105 101  CO2 22 21*  GLUCOSE 94 139*  BUN 40* 46*  CREATININE 1.49* 1.81*  CALCIUM 8.6* 8.8*  GFRNONAA 31* 24*  GFRAA 36* 28*  ANIONGAP 8 12     Hematology  Recent Labs Lab 09/21/17 1119 09/22/17 1204  WBC 8.5 15.9*  RBC 2.85* 4.03  HGB 7.0* 9.9*  HCT 23.9* 33.9*  MCV 83.9 84.1  MCH 24.6* 24.6*  MCHC 29.3* 29.2*  RDW 20.1* 18.9*  PLT 298 254    Cardiac  EnzymesNo results for input(s): TROPONINI in the last 168 hours.   Recent Labs Lab 09/21/17 1143  TROPIPOC 0.10*     BNP  Recent Labs Lab 09/21/17 1223  BNP >4,500.0*      Radiology    Dg Chest 2 View  Result Date: 09/21/2017 CLINICAL DATA:  83 year old female with history of abdominal pain radiating into the shoulders. EXAM: CHEST  2 VIEW COMPARISON:  Chest x-ray 09/11/2017. FINDINGS: Lung volumes are low. Diffuse peribronchial cuffing. Patchy areas of interstitial prominence, most evident throughout the right mid to lower lung. No confluent consolidative airspace disease. Small right pleural effusion. Crowding of the pulmonary vasculature without frank pulmonary edema. Heart size appears mildly enlarged. Upper mediastinal contours are distorted by patient positioning. Aortic atherosclerosis. Surgical clips project over the left axillary region, presumably from prior lymph node dissection. IMPRESSION: 1. The appearance of the chest suggests bronchitis,  potentially with developing right lower lobe bronchopneumonia. 2. Small right pleural effusion. 3. Mild cardiomegaly. 4. Aortic atherosclerosis. Electronically Signed   By: Vinnie Langton M.D.   On: 09/21/2017 13:22    Cardiac Studies   Eco 08/21/17 Study Conclusions  - Left ventricle: The cavity size was normal. There was severe   focal basal and moderate concentric hypertrophy. Systolic   function was normal. The estimated ejection fraction was in the   range of 55% to 60%. Wall motion was normal; there were no   regional wall motion abnormalities. Features are consistent with   a pseudonormal left ventricular filling pattern, with concomitant   abnormal relaxation and increased filling pressure (grade 2   diastolic dysfunction). Doppler parameters are consistent with   high ventricular filling pressure. - Aortic valve: Trileaflet; moderately thickened, mildly calcified   leaflets. There was moderate regurgitation. - Aorta: Ascending aorta diameter: 41 mm (ED). - Ascending aorta: The ascending aorta was mildly dilated. - Mitral valve: Calcified annulus. There was moderate regurgitation   directed eccentrically and toward the septum. - Left atrium: The atrium was severely dilated. - Right ventricle: The cavity size was mildly dilated. Wall   thickness was normal. Systolic function was mildly reduced. - Right atrium: The atrium was severely dilated. - Pulmonic valve: There was moderate regurgitation. - Pulmonary arteries: PA peak pressure: 60 mm Hg (S). - Pericardium, extracardiac: A small, free-flowing pericardial   effusion was identified circumferential to the heart. The fluid   had no internal echoes.  Impressions:  - The right ventricular systolic pressure was increased consistent   with moderate pulmonary hypertension.  Patient Profile     83 y.o. female with a hx of hypertrophic CM w/out obstruction, EF 50-55% , grade 2 DD, mod AI/ MR, moderate PAH, ovarian  mass>>refused dx/tx, lung mass>>benign on bx but enlarging, chronic blood loss anemia, HTN, CKD III, breast CA>>mastectomy 1984, 4.7 cm AscAo aneurysm on CT 12/2016 presented with generalized weakness and diastolic CHF by BNP.   Assessment & Plan    1. Acute on chronic diastolic CHF - BNP > 4193 in setting of acute anemia. Hgb 7 and positive stool guaiac. I & O inaccurate due to incontinence and UTI. Wgts no significantly changed. Renal function improved.  2. Elevated troponin - Demand in setting of acute anemia and CHF.  3. Recurrent symptomatic anemia - Contributing to her dyspnea. Evaluation per primary team. Transfused- Hgb went from 7.0-9.9  4 Acute on chronic kidney disease, stage III - Scr 1.8 today  Plan:  She is on Lasix 40 mg PO BID. Will  check BNP as this may be the only reliable way to evaluate her volume status. MD to see.      Signed, Kerin Ransom, PA-C  09/23/2017, 10:03 AM

## 2017-09-23 NOTE — Progress Notes (Signed)
Patient stable 7p-7a, patient very drowsy throughout the night.

## 2017-09-23 NOTE — Progress Notes (Addendum)
PROGRESS NOTE    Valerie Downs  BSW:[id number] DOB: 08-09-1930 DOA: 09/21/2017 PCP: Golden Circle, FNP    Brief Narrative:  83 year old female who presented with weakness. Patient does have significant past medical history of aortic insufficiency, mitral regurgitation, chronic blood loss anemia, diastolic heart failure, hypertension, chronic kidney disease and tobacco abuse. She complained of 3 days of worsening dyspnea, associated with lower extremity edema and orthopnea, no chest pain or frank angina. On initial physical examination 141/60, heart rate 75, respiratory 25, temperature 98.4, oxygen saturation 100%, moist mucous membranes, moderate JVD, lungs with decreased breath sounds bilaterally, bibasilar rales, heart S1-S2 present and rhythmic, positive systolic murmur 3/6, at the right parasternal region, no radiation, abdomen soft nontender, lower extremities with 3+ pitting edema bilaterally. Sodium 135, potassium 5.2, chloride 105, bicarbonate 22, glucose 94, BUN 40, creatinine 1.49, calcium 8.6, white count 15.9, hemoglobin 9.9, hematocrit 50.9, platelets 254, urinalysis with too numerous count white cells, 6-30 squamous epithelial cells, 100 protein, specific gravity 1.015, fecal occult was positive, INR 1.1, chest x-ray hypoinflated, cephalization of the vasculature, congested hilar regions, EKG sinus, rate of 80 bpm, left axis deviation, poor R-wave progression, positive PVCs in a bigeminy pattern.   Patient was admitted to hospital working diagnosis of decompensated heart failure, complicated by urinary tract infection present on admission.   Assessment & Plan:   Active Problems:   HYPERTENSION, BENIGN SYSTEMIC   Lower urinary tract infectious disease   Symptomatic anemia   Elevated troponin   Acute on chronic diastolic CHF (congestive heart failure) (HCC)   CKD (chronic kidney disease), stage III   1. Decompensated diastolic heart failure, acute on chronic. Today patient  more euvolemic, not accurate urine output, will continue with po furosemide, target negative fluid balance, no dyspnea, or chest pain. Will continue metoprolol, will continue to hold ace inh due to risk of worsening renal function.   2. Urinary tract infection, present on admission. Responding well to ceftriaxone, will follow on cell count in am.   3. Chronic kidney disease stage 3 with base cr 1.4 and calculated GFR 40. Will continue to follow renal function, patient on po furosemide. Pending metabolic panel. Patient tolerating po well.   Patient has refused blood work, will hold furosemide for now, will attempt to get BMP in am.   DVT prophylaxis: heparin  Code Status: full Family Communication: Disposition Plan: home   Consultants:     Procedures:     Antimicrobials:   Ceftriaxone     Subjective: Patient deconditioned, not in pain, dyspnea has improved, no nausea or vomiting.   Objective: Vitals:   09/23/17 0321 09/23/17 0815 09/23/17 0909 09/23/17 1215  BP: 126/66 (!) 113/53  (!) 119/48  Pulse: 71 66  73  Resp: 18 18  18   Temp: 97.9 F (36.6 C) 97.8 F (36.6 C)  98.1 F (36.7 C)  TempSrc: Oral Oral  Oral  SpO2: 97% 94% 99% 96%  Weight:      Height:        Intake/Output Summary (Last 24 hours) at 09/23/17 1327 Last data filed at 09/23/17 0650  Gross per 24 hour  Intake              460 ml  Output              100 ml  Net              360 ml   Filed Weights   09/21/17 1849 09/22/17 0442  09/23/17 0258  Weight: 62.8 kg (138 lb 8 oz) 63.8 kg (140 lb 9.6 oz) 63.2 kg (139 lb 4.8 oz)    Examination:  General: deconditioned.  Neurology: Awake and alert, non focal  E ENT: positive pallor, no icterus, oral mucosa moist Cardiovascular: No JVD. S1-S2 present, rhythmic, no gallops, rubs, or murmurs. No lower extremity edema. Pulmonary: decreased breath sounds bilaterally, adequate air movement, no wheezing, rhonchi or rales. Gastrointestinal. Abdomen  flat, no organomegaly, non tender, no rebound or guarding Skin. No rashes Musculoskeletal: no joint deformities     Data Reviewed: I have personally reviewed following labs and imaging studies  CBC:  Recent Labs Lab 09/21/17 1119 09/22/17 1204  WBC 8.5 15.9*  NEUTROABS 6.4  --   HGB 7.0* 9.9*  HCT 23.9* 33.9*  MCV 83.9 84.1  PLT 298 681   Basic Metabolic Panel:  Recent Labs Lab 09/21/17 1547 09/22/17 1204  NA 135 134*  K 5.2* 5.1  CL 105 101  CO2 22 21*  GLUCOSE 94 139*  BUN 40* 46*  CREATININE 1.49* 1.81*  CALCIUM 8.6* 8.8*  MG  --  2.0  PHOS  --  5.2*   GFR: Estimated Creatinine Clearance: 19.4 mL/min (A) (by C-G formula based on SCr of 1.81 mg/dL (H)). Liver Function Tests: No results for input(s): AST, ALT, ALKPHOS, BILITOT, PROT, ALBUMIN in the last 168 hours. No results for input(s): LIPASE, AMYLASE in the last 168 hours. No results for input(s): AMMONIA in the last 168 hours. Coagulation Profile:  Recent Labs Lab 09/21/17 1240  INR 1.11   Cardiac Enzymes: No results for input(s): CKTOTAL, CKMB, CKMBINDEX, TROPONINI in the last 168 hours. BNP (last 3 results)  Recent Labs  04/01/17 1142 04/28/17 1054  PROBNP 31,525* 3,325.0*   HbA1C: No results for input(s): HGBA1C in the last 72 hours. CBG: No results for input(s): GLUCAP in the last 168 hours. Lipid Profile: No results for input(s): CHOL, HDL, LDLCALC, TRIG, CHOLHDL, LDLDIRECT in the last 72 hours. Thyroid Function Tests: No results for input(s): TSH, T4TOTAL, FREET4, T3FREE, THYROIDAB in the last 72 hours. Anemia Panel: No results for input(s): VITAMINB12, FOLATE, FERRITIN, TIBC, IRON, RETICCTPCT in the last 72 hours.    Radiology Studies: I have reviewed all of the imaging during this hospital visit personally     Scheduled Meds: . feeding supplement  1 Container Oral BID BM  . feeding supplement (ENSURE ENLIVE)  237 mL Oral BID  . furosemide  40 mg Oral BID  . metoprolol  tartrate  25 mg Oral BID  . sodium chloride flush  3 mL Intravenous Q12H   Continuous Infusions: . sodium chloride    . cefTRIAXone (ROCEPHIN)  IV Stopped (09/22/17 1908)     LOS: 2 days        Barnes Florek Gerome Apley, MD [facility] Pager [phone]

## 2017-09-24 LAB — CBC WITH DIFFERENTIAL/PLATELET
Basophils Absolute: 0 10*3/uL (ref 0.0–0.1)
Basophils Relative: 0 %
EOS PCT: 0 %
Eosinophils Absolute: 0 10*3/uL (ref 0.0–0.7)
HEMATOCRIT: 32.4 % — AB (ref 36.0–46.0)
Hemoglobin: 9.6 g/dL — ABNORMAL LOW (ref 12.0–15.0)
LYMPHS ABS: 1.4 10*3/uL (ref 0.7–4.0)
LYMPHS PCT: 6 %
MCH: 25.5 pg — AB (ref 26.0–34.0)
MCHC: 29.6 g/dL — ABNORMAL LOW (ref 30.0–36.0)
MCV: 85.9 fL (ref 78.0–100.0)
MONO ABS: 0.7 10*3/uL (ref 0.1–1.0)
Monocytes Relative: 3 %
Neutro Abs: 21 10*3/uL — ABNORMAL HIGH (ref 1.7–7.7)
Neutrophils Relative %: 91 %
PLATELETS: 254 10*3/uL (ref 150–400)
RBC: 3.77 MIL/uL — AB (ref 3.87–5.11)
RDW: 19.6 % — ABNORMAL HIGH (ref 11.5–15.5)
WBC: 23.2 10*3/uL — AB (ref 4.0–10.5)

## 2017-09-24 LAB — BASIC METABOLIC PANEL
Anion gap: 11 (ref 5–15)
BUN: 66 mg/dL — AB (ref 6–20)
CHLORIDE: 104 mmol/L (ref 101–111)
CO2: 18 mmol/L — ABNORMAL LOW (ref 22–32)
Calcium: 8.9 mg/dL (ref 8.9–10.3)
Creatinine, Ser: 2.79 mg/dL — ABNORMAL HIGH (ref 0.44–1.00)
GFR calc Af Amer: 17 mL/min — ABNORMAL LOW (ref >60)
GFR calc non Af Amer: 14 mL/min — ABNORMAL LOW (ref >60)
GLUCOSE: 106 mg/dL — AB (ref 65–99)
POTASSIUM: 5.8 mmol/L — AB (ref 3.5–5.1)
Sodium: 133 mmol/L — ABNORMAL LOW (ref 135–145)

## 2017-09-24 LAB — BRAIN NATRIURETIC PEPTIDE: B Natriuretic Peptide: >4500 pg/mL — ABNORMAL HIGH (ref 0.0–100.0)

## 2017-09-24 MED ORDER — SODIUM POLYSTYRENE SULFONATE 15 GM/60ML PO SUSP
30.0000 g | ORAL | Status: AC
  Administered 2017-09-24: 30 g via ORAL
  Filled 2017-09-24 (×2): qty 120

## 2017-09-24 NOTE — Progress Notes (Signed)
PROGRESS NOTE    Valerie Downs  SAY:[id number] DOB: November 07, 1930 DOA: 09/21/2017 PCP: Golden Circle, FNP    Brief Narrative:  83 year old female who presented with weakness. Patient does have significant past medical history of aortic insufficiency, mitral regurgitation, chronic blood loss anemia, diastolic heart failure, hypertension, chronic kidney disease and tobacco abuse. She complained of 3 days of worsening dyspnea, associated with lower extremity edema and orthopnea, no chest pain or frank angina. On initial physical examination 141/60, heart rate 75, respiratory 25, temperature 98.4, oxygen saturation 100%, moist mucous membranes, moderate JVD, lungs with decreased breath sounds bilaterally, bibasilar rales, heart S1-S2 present and rhythmic, positive systolic murmur 3/6, at the right parasternal region, no radiation, abdomen soft nontender, lower extremities with 3+ pitting edema bilaterally. Sodium 135, potassium 5.2, chloride 105, bicarbonate 22, glucose 94, BUN 40, creatinine 1.49, calcium 8.6, white count 15.9, hemoglobin 9.9, hematocrit 50.9, platelets 254,urinalysis with too numerous count white cells, 6-30 squamous epithelial cells, 100 protein, specific gravity 1.015, fecal occult was positive, INR 1.1, chest x-ray hypoinflated, cephalization of the vasculature, congested hilar regions, EKG sinus, rate of 80 bpm, left axis deviation, poor R-wave progression, positive PVCs in a bigeminy pattern.   Patient was admitted to hospital working diagnosis of decompensated heart failure, complicated by urinary tract infection present on admission.   Assessment & Plan:   Active Problems:   HYPERTENSION, BENIGN SYSTEMIC   Lower urinary tract infectious disease   Symptomatic anemia   Elevated troponin   Acute on chronic diastolic CHF (congestive heart failure) (HCC)   CKD (chronic kidney disease), stage III   1. Decompensated diastolic heart failure, acute on chronic. Positive edema,  still not accurate urine output, patient has declined blood work this am, will continue to hold on furosemide to prevent worsening renal function ro electrolyte abnormalities, clinically patient with no signs of cardiogenic pulmonary edema. Continue blood pressure control. Continue metoprolol 25 mg po bid  2. Urinary tract infection, present on admission. Continue with IV ceftriaxone with good toleration, cultures with multiple species, will plan to treat for 5 days.   3. Chronic kidney disease stage 3 with base cr 1.4 and calculated GFR 40. Patient declined blood work, will continue to avoid nephrotoxic medications or hypotension. Patient as agreed to have blood work today.   4. Deconditioning. Patient very weak and deconditioned, will plan for snf placement at discharge per physical therapy recommendations.   DVT prophylaxis:heparin Code Status:full Family Communication: I spoke with patient's son at the bedside and all questions were addressed. Patient's family in agreement for snf placement.  Disposition Plan:snf   Consultants:    Procedures:    Antimicrobials:   Ceftriaxone   Subjective: Patient feeling very weak and deconditioned, has been refusing blood work and further interventions, no nausea or vomiting, no chest pain.   Objective: Vitals:   09/23/17 0909 09/23/17 1215 09/23/17 1947 09/24/17 0400  BP:  (!) 119/48 (!) 121/55 (!) 115/48  Pulse:  73 68 70  Resp:  18 18 20   Temp:  98.1 F (36.7 C)  (!) 97.5 F (36.4 C)  TempSrc:  Oral  Oral  SpO2: 99% 96% 100% 92%  Weight:    63.5 kg (140 lb 1.6 oz)  Height:        Intake/Output Summary (Last 24 hours) at 09/24/17 1034 Last data filed at 09/24/17 0937  Gross per 24 hour  Intake              710 ml  Output                0 ml  Net              710 ml   Filed Weights   09/22/17 0442 09/23/17 0258 09/24/17 0400  Weight: 63.8 kg (140 lb 9.6 oz) 63.2 kg (139 lb 4.8 oz) 63.5 kg (140 lb 1.6 oz)     Examination:  General: Not in pain or dyspnea, deconditioned Neurology: Awake and alert, non focal  E ENT: mild pallor, no icterus, oral mucosa moist Cardiovascular: No JVD. S1-S2 present, rhythmic, no gallops, rubs, or murmurs. +/++ pitting lower extremity edema. Pulmonary: decreased breath sounds bilaterally, adequate air movement, no wheezing, rhonchi or rales. Gastrointestinal. Abdomen flat, no organomegaly, non tender, no rebound or guarding Skin. No rashes Musculoskeletal: no joint deformities     Data Reviewed: I have personally reviewed following labs and imaging studies  CBC:  Recent Labs Lab 09/21/17 1119 09/22/17 1204  WBC 8.5 15.9*  NEUTROABS 6.4  --   HGB 7.0* 9.9*  HCT 23.9* 33.9*  MCV 83.9 84.1  PLT 298 122   Basic Metabolic Panel:  Recent Labs Lab 09/21/17 1547 09/22/17 1204  NA 135 134*  K 5.2* 5.1  CL 105 101  CO2 22 21*  GLUCOSE 94 139*  BUN 40* 46*  CREATININE 1.49* 1.81*  CALCIUM 8.6* 8.8*  MG  --  2.0  PHOS  --  5.2*   GFR: Estimated Creatinine Clearance: 19.4 mL/min (A) (by C-G formula based on SCr of 1.81 mg/dL (H)). Liver Function Tests: No results for input(s): AST, ALT, ALKPHOS, BILITOT, PROT, ALBUMIN in the last 168 hours. No results for input(s): LIPASE, AMYLASE in the last 168 hours. No results for input(s): AMMONIA in the last 168 hours. Coagulation Profile:  Recent Labs Lab 09/21/17 1240  INR 1.11   Cardiac Enzymes: No results for input(s): CKTOTAL, CKMB, CKMBINDEX, TROPONINI in the last 168 hours. BNP (last 3 results)  Recent Labs  04/01/17 1142 04/28/17 1054  PROBNP 31,525* 3,325.0*   HbA1C: No results for input(s): HGBA1C in the last 72 hours. CBG: No results for input(s): GLUCAP in the last 168 hours. Lipid Profile: No results for input(s): CHOL, HDL, LDLCALC, TRIG, CHOLHDL, LDLDIRECT in the last 72 hours. Thyroid Function Tests: No results for input(s): TSH, T4TOTAL, FREET4, T3FREE, THYROIDAB in the  last 72 hours. Anemia Panel: No results for input(s): VITAMINB12, FOLATE, FERRITIN, TIBC, IRON, RETICCTPCT in the last 72 hours.    Radiology Studies: I have reviewed all of the imaging during this hospital visit personally     Scheduled Meds: . feeding supplement  1 Container Oral BID BM  . feeding supplement (ENSURE ENLIVE)  237 mL Oral BID  . metoprolol tartrate  25 mg Oral BID  . sodium chloride flush  3 mL Intravenous Q12H   Continuous Infusions: . sodium chloride    . cefTRIAXone (ROCEPHIN)  IV Stopped (09/23/17 2129)     LOS: 3 days        Rosea Dory Gerome Apley, MD [facility] Pager [phone]

## 2017-09-24 NOTE — Progress Notes (Signed)
VSS during 7 a to 7 p shift, patient continues to have very minimal urine output.  Foley catheter unable to be placed by 2 RN's as per previous note, purewick in place, patient with scant amount of urine in suction canister at end of shift.  Kayexalate given with large incontinent bowel movement.  Patient up to chair and BSC with one assist, says she feels very weak.

## 2017-09-24 NOTE — Progress Notes (Signed)
Patient has pitting edema in bilateral lower extremities up to her back.

## 2017-09-24 NOTE — Progress Notes (Signed)
Bladder scan of patient after attempt to place foley revealed >177 mls.  Patient has no urge to void, purewick placed.

## 2017-09-24 NOTE — Progress Notes (Signed)
Patient has had 3 BM's since receiving Kayexalate, refuses second dose.  Multiple family members in room.  Patient did have some urine output with 3rd BM, purewick replaced when patient returned to bed.

## 2017-09-24 NOTE — Progress Notes (Signed)
Attempted foley catheter placement by 2 RNs.  Dr. Cathlean Sauer paged.

## 2017-09-24 NOTE — Progress Notes (Signed)
Progress Note  Patient Name: Valerie Downs Date of Encounter: 09/24/2017    Subjective   Denies any SOB  Inpatient Medications    Scheduled Meds: . feeding supplement  1 Container Oral BID BM  . feeding supplement (ENSURE ENLIVE)  237 mL Oral BID  . metoprolol tartrate  25 mg Oral BID  . sodium chloride flush  3 mL Intravenous Q12H   Continuous Infusions: . sodium chloride    . cefTRIAXone (ROCEPHIN)  IV Stopped (09/23/17 2129)   PRN Meds: sodium chloride, acetaminophen **OR** acetaminophen, ondansetron **OR** ondansetron (ZOFRAN) IV, sodium chloride flush   Vital Signs    Vitals:   09/23/17 0909 09/23/17 1215 09/23/17 1947 09/24/17 0400  BP:  (!) 119/48 (!) 121/55 (!) 115/48  Pulse:  73 68 70  Resp:  18 18 20   Temp:  98.1 F (36.7 C)  (!) 97.5 F (36.4 C)  TempSrc:  Oral  Oral  SpO2: 99% 96% 100% 92%  Weight:    140 lb 1.6 oz (63.5 kg)  Height:        Intake/Output Summary (Last 24 hours) at 09/24/17 1100 Last data filed at 09/24/17 0937  Gross per 24 hour  Intake              710 ml  Output                0 ml  Net              710 ml   Filed Weights   09/22/17 0442 09/23/17 0258 09/24/17 0400  Weight: 140 lb 9.6 oz (63.8 kg) 139 lb 4.8 oz (63.2 kg) 140 lb 1.6 oz (63.5 kg)    Telemetry    SR, short run SVT - Personally Reviewed  ECG    n/a  Physical Exam   GEN: No acute distress.   Neck: JVD to earlobe Cardiac: RRR, 2/6 systolic murmur at apex Respiratory: bilateral crackles GI: Soft, nontender, non-distended  MS: No edema; No deformity. Neuro:  Nonfocal  Psych: Normal affect   Labs    Chemistry Recent Labs Lab 09/21/17 1547 09/22/17 1204  NA 135 134*  K 5.2* 5.1  CL 105 101  CO2 22 21*  GLUCOSE 94 139*  BUN 40* 46*  CREATININE 1.49* 1.81*  CALCIUM 8.6* 8.8*  GFRNONAA 31* 24*  GFRAA 36* 28*  ANIONGAP 8 12     Hematology Recent Labs Lab 09/21/17 1119 09/22/17 1204  WBC 8.5 15.9*  RBC 2.85* 4.03  HGB 7.0* 9.9*    HCT 23.9* 33.9*  MCV 83.9 84.1  MCH 24.6* 24.6*  MCHC 29.3* 29.2*  RDW 20.1* 18.9*  PLT 298 254    Cardiac EnzymesNo results for input(s): TROPONINI in the last 168 hours.  Recent Labs Lab 09/21/17 1143  TROPIPOC 0.10*     BNP Recent Labs Lab 09/21/17 1223  BNP >4,500.0*     DDimer No results for input(s): DDIMER in the last 168 hours.   Radiology    No results found.  Cardiac Studies    Patient Profile     83 y.o. female with a hx of hypertrophic CM w/out obstruction, EF 50-55% , grade 2 DD, mod AI/ MR, moderate PAH, ovarian mass>>refused dx/tx, lung mass>>benign on bx but enlarging, chronic blood loss anemia, HTN, CKD III, breast CA>>mastectomy 1984, 4.7 cm AscAo aneurysm on CT 12/2016 presented with generalized weakness and diastolic CHF by BNP.   Assessment & Plan    1.  Acute on chronic diastolic HF - 08/2956 LVEF 60-65%, grade II diastolic dysfunction, mild RV dysfunction, severe biatrial enlargement. PASP 60 - I/Os have been inaccurate due to incontinence, reported weights unchanged.Uptrend in Cr, no repeat labs back today.  - we will place a foley to better document her I/Os  - management complicated by difficulty measuring I/Os due to incontinence, patient has been refusing blood tests as well. We have no data to guide management. She agreed to have labs drawn this afternoon, f/u results and diuretic dosing pending results. SHe remains severely volume overloaded based on exam.   2. Valvular heart disease - moderate AI and MR - will need to be followed over time  3. Anemia - per primary team   For questions or updates, please contact [facility] Please consult [email or web address] for contact info under Cardiology/STEMI.      Merrily Pew, MD  09/24/2017, 11:00 AM

## 2017-09-24 NOTE — Progress Notes (Signed)
Patient refusing lab draw this more, RN educated patient, patient still refuses.

## 2017-09-24 NOTE — Plan of Care (Signed)
Problem: Tissue Perfusion: Goal: Risk factors for ineffective tissue perfusion will decrease Outcome: Progressing Maintains oxygen saturation in the 90's on room air   Problem: Activity: Goal: Risk for activity intolerance will decrease Outcome: Not Progressing Patient says she feels very weak,  Up to chair with max one assist   Problem: Fluid Volume: Goal: Ability to maintain a balanced intake and output will improve Outcome: Not Progressing Patient with minimal urinary output this shift, foley catheter unable to be placed, purewick in place   Problem: Nutrition: Goal: Adequate nutrition will be maintained Outcome: Not Progressing Patient eats small amounts of food only, will drink an occasional Ensure or Breeze

## 2017-09-25 LAB — BASIC METABOLIC PANEL
Anion gap: 12 (ref 5–15)
BUN: 69 mg/dL — AB (ref 6–20)
CHLORIDE: 100 mmol/L — AB (ref 101–111)
CO2: 20 mmol/L — ABNORMAL LOW (ref 22–32)
CREATININE: 3.09 mg/dL — AB (ref 0.44–1.00)
Calcium: 8.6 mg/dL — ABNORMAL LOW (ref 8.9–10.3)
GFR calc Af Amer: 15 mL/min — ABNORMAL LOW (ref >60)
GFR, EST NON AFRICAN AMERICAN: 13 mL/min — AB (ref >60)
Glucose, Bld: 122 mg/dL — ABNORMAL HIGH (ref 65–99)
POTASSIUM: 5.2 mmol/L — AB (ref 3.5–5.1)
SODIUM: 132 mmol/L — AB (ref 135–145)

## 2017-09-25 MED ORDER — SODIUM CHLORIDE 0.9 % IV SOLN
INTRAVENOUS | Status: DC
  Administered 2017-09-25 – 2017-09-27 (×4): via INTRAVENOUS
  Administered 2017-09-27: 100 mL/h via INTRAVENOUS

## 2017-09-25 NOTE — Progress Notes (Signed)
Progress Note  Patient Name: Valerie Downs Date of Encounter: 09/25/2017  Subjective   Denies any SOB  Inpatient Medications    Scheduled Meds: . feeding supplement  1 Container Oral BID BM  . feeding supplement (ENSURE ENLIVE)  237 mL Oral BID  . metoprolol tartrate  25 mg Oral BID  . sodium chloride flush  3 mL Intravenous Q12H   Continuous Infusions: . sodium chloride    . sodium chloride 50 mL/hr at 09/25/17 1111  . cefTRIAXone (ROCEPHIN)  IV 1 g (09/24/17 1817)   PRN Meds: sodium chloride, acetaminophen **OR** acetaminophen, ondansetron **OR** ondansetron (ZOFRAN) IV, sodium chloride flush   Vital Signs    Vitals:   09/24/17 1227 09/24/17 2011 09/25/17 0439 09/25/17 1109  BP: (!) 96/50 (!) 104/52 (!) 133/56 133/62  Pulse: 69 62 62 72  Resp: 20 20 20    Temp: 97.7 F (36.5 C) (!) 97.3 F (36.3 C) 98.4 F (36.9 C)   TempSrc: Axillary Oral Oral   SpO2: 93% 93% 93% 94%  Weight:   143 lb 8 oz (65.1 kg)   Height:        Intake/Output Summary (Last 24 hours) at 09/25/17 1135 Last data filed at 09/25/17 0941  Gross per 24 hour  Intake              120 ml  Output              450 ml  Net             -330 ml   Filed Weights   09/23/17 0258 09/24/17 0400 09/25/17 0439  Weight: 139 lb 4.8 oz (63.2 kg) 140 lb 1.6 oz (63.5 kg) 143 lb 8 oz (65.1 kg)    Telemetry    SR - Personally Reviewed  ECG    Physical Exam   GEN: No acute distress.   Neck: elevated JVD Cardiac: RRR, no murmurs, rubs, or gallops.  Respiratory: Clear to auscultation bilaterally. GI: Soft, nontender, non-distended  MS: No edema; No deformity. Neuro:  Nonfocal  Psych: Normal affect   Labs    Chemistry Recent Labs Lab 09/22/17 1204 09/24/17 1115 09/25/17 0958  NA 134* 133* 132*  K 5.1 5.8* 5.2*  CL 101 104 100*  CO2 21* 18* 20*  GLUCOSE 139* 106* 122*  BUN 46* 66* 69*  CREATININE 1.81* 2.79* 3.09*  CALCIUM 8.8* 8.9 8.6*  GFRNONAA 24* 14* 13*  GFRAA 28* 17* 15*    ANIONGAP 12 11 12      Hematology Recent Labs Lab 09/21/17 1119 09/22/17 1204 09/24/17 1115  WBC 8.5 15.9* 23.2*  RBC 2.85* 4.03 3.77*  HGB 7.0* 9.9* 9.6*  HCT 23.9* 33.9* 32.4*  MCV 83.9 84.1 85.9  MCH 24.6* 24.6* 25.5*  MCHC 29.3* 29.2* 29.6*  RDW 20.1* 18.9* 19.6*  PLT 298 254 254    Cardiac EnzymesNo results for input(s): TROPONINI in the last 168 hours.  Recent Labs Lab 09/21/17 1143  TROPIPOC 0.10*     BNP Recent Labs Lab 09/21/17 1223 09/24/17 1109  BNP >4,500.0* >4,500.0*     DDimer No results for input(s): DDIMER in the last 168 hours.   Radiology    No results found.  Cardiac Studies    Patient Profile      Assessment & Plan    1. Acute on chronic diastolic HF - 0/9381 LVEF 60-65%, grade II diastolic dysfunction, mild RV dysfunction, severe biatrial enlargement. PASP 60 - I/Os have been inaccurate  due to incontinence. Patient has intermittently refused labs also complicating management - signiciant uptrend in Cr, hold diuretics today. She still looks somewhat volume overloaded, would consider renal consult.   2. Valvular heart disease - moderate AI and MR - will need to be followed over time  3. Anemia - per primary team  For questions or updates, please contact Kimberly Please consult [email or web address] for contact info under Cardiology/STEMI.      Merrily Pew, MD  09/25/2017, 11:35 AM

## 2017-09-25 NOTE — Plan of Care (Signed)
Problem: Tissue Perfusion: Goal: Risk factors for ineffective tissue perfusion will decrease Outcome: Progressing Maintains oxygen saturation in 90's on room air   Problem: Activity: Goal: Risk for activity intolerance will decrease Outcome: Not Progressing Patient continues to feel weak, requiring one max assist to stand and pivot to BSC/chair   Problem: Fluid Volume: Goal: Ability to maintain a balanced intake and output will improve Outcome: Not Progressing Patient continues with minimal urine output, IV fluids started this shift   Problem: Nutrition: Goal: Adequate nutrition will be maintained Outcome: Not Progressing Poor po intake continues, will take Ensure/Breeze on occasion

## 2017-09-25 NOTE — Progress Notes (Signed)
VSS 7 a to 7 p shift, patient without complaint other than feels very weak.  Patient up with one moderate to max assist to [facility], sat up in chair for a short period of time.  Patient continues to have very minimal urine output, IV fluids started around 11 a.m. Patient did void incontinently one time at end of shift.  Patient eating minimally, says she feels bloated, did drink one Breeze drink this shift.  Various family members in and out throughout shift.  Continues with significant pitting edema up into buttocks and back.

## 2017-09-25 NOTE — Progress Notes (Addendum)
Pt has not had any urine out-put since 1900 Per RN notes MD is aware that several attempts to place foley were unsuccessful. MD on call page and informed that patient has not voided. Arthor Captain LPN

## 2017-09-25 NOTE — Progress Notes (Signed)
I &O cath for 50 ml out put for 12hrs MD paged and made aware. Arthor Captain LPN

## 2017-09-25 NOTE — Progress Notes (Signed)
PROGRESS NOTE    Valerie Downs  QIH:[id number] DOB: Sep 15, 1930 DOA: 09/21/2017 PCP: Golden Circle, FNP    Brief Narrative:  83 year old female who presented with weakness. Patient does have significant past medical history of aortic insufficiency, mitral regurgitation, chronic blood loss anemia, diastolic heart failure, hypertension, chronic kidney disease and tobacco abuse. She complained of 3 days of worsening dyspnea, associated with lower extremity edema and orthopnea, no chest pain or frank angina. On initial physical examination 141/60, heart rate 75, respiratory 25, temperature 98.4, oxygen saturation 100%, moist mucous membranes, moderate JVD, lungs with decreased breath sounds bilaterally, bibasilar rales, heart S1-S2 present and rhythmic, positive systolic murmur 3/6, at the right parasternal region, no radiation, abdomen soft nontender, lower extremities with 3+ pitting edema bilaterally. Sodium 135, potassium 5.2, chloride 105, bicarbonate 22, glucose 94, BUN 40, creatinine 1.49, calcium 8.6, white count 15.9, hemoglobin 9.9, hematocrit 50.9, platelets 254,urinalysis with too numerous count white cells, 6-30 squamous epithelial cells, 100 protein, specific gravity 1.015, fecal occult was positive, INR 1.1, chest x-ray hypoinflated, cephalization of the vasculature, congested hilar regions, EKG sinus, rate of 80 bpm, left axis deviation, poor R-wave progression, positive PVCs in a bigeminy pattern.   Patient was admitted to hospital working diagnosis of decompensated heart failure, complicated by urinary tract infection present on admission.   Assessment & Plan:   Active Problems:   HYPERTENSION, BENIGN SYSTEMIC   Lower urinary tract infectious disease   Symptomatic anemia   Elevated troponin   Acute on chronic diastolic CHF (congestive heart failure) (HCC)   CKD (chronic kidney disease), stage III   1. Decompensated diastolic heart failure, acute on chronic. Patient  clinically dry with worsening renal function, will continue to hold on furosemide and will add gentle IV fluids, noted chronically elevated BNP. Poor oral intake and reduced urine output. Continue B blockade with metoprolol, no ace inh due to risk of worsening renal function.   2. Urinary tract infection, present on admission.  IV ceftriaxone#4/5, tolerating well. No fevers, or urinary symptoms.   3. Chronic kidney disease stage 3 with base cr 1.4 and calculated GFR 40. Worsening renal function, now with serum cr up to 3.09, will start patient hydration with NS at 50 ml per hour and will continue to follow up renal function in am. K down to 5,2 from 5,8 with one dose of kayexalate.    4. Deconditioning. Continue patient to be very weak and deconditioned. Will need snf at discharge.  DVT prophylaxis:heparin Code Status:full Family Communication: I spoke with patient's son at the bedside and all questions were addressed. Patient's family in agreement for snf placement.  Disposition Plan:snf   Consultants:  Cardiology   Procedures:    Antimicrobials:   Ceftriaxone    Subjective: Patient with very poor oral intake, decreases urine output, verified by bladder scan, feeling very weak and deconditioned, no nausea or vomiting, no chest pain or dyspnea, no lower extremity edema, orthopnea or pnd.   Objective: Vitals:   09/24/17 1101 09/24/17 1227 09/24/17 2011 09/25/17 0439  BP: (!) 132/56 (!) 96/50 (!) 104/52 (!) 133/56  Pulse: 73 69 62 62  Resp:  20 20 20   Temp:  97.7 F (36.5 C) (!) 97.3 F (36.3 C) 98.4 F (36.9 C)  TempSrc:  Axillary Oral Oral  SpO2:  93% 93% 93%  Weight:    65.1 kg (143 lb 8 oz)  Height:        Intake/Output Summary (Last 24 hours) at 09/25/17  [place] filed at 09/25/17 0941  Gross per 24 hour  Intake              120 ml  Output              450 ml  Net             -330 ml   Filed Weights   09/23/17 0258 09/24/17 0400 09/25/17  0439  Weight: 63.2 kg (139 lb 4.8 oz) 63.5 kg (140 lb 1.6 oz) 65.1 kg (143 lb 8 oz)    Examination:  General: Not in pain or dyspnea, deconditioned and ill looking appearing.  Neurology: Awake and alert, non focal  E ENT: mild pallor, no icterus, oral mucosa moist Cardiovascular: No JVD. S1-S2 present, rhythmic, no gallops, rubs, or murmurs. Trace pitting lower extremity edema, at the dependent zones.  Pulmonary: vesicular breath sounds bilaterally, adequate air movement, no wheezing, rhonchi, positive scattered  rales at bases. Gastrointestinal. Abdomen flat, no organomegaly, non tender, no rebound or guarding Skin. No rashes Musculoskeletal: no joint deformities     Data Reviewed: I have personally reviewed following labs and imaging studies  CBC:  Recent Labs Lab 09/21/17 1119 09/22/17 1204 09/24/17 1115  WBC 8.5 15.9* 23.2*  NEUTROABS 6.4  --  21.0*  HGB 7.0* 9.9* 9.6*  HCT 23.9* 33.9* 32.4*  MCV 83.9 84.1 85.9  PLT 298 254 673   Basic Metabolic Panel:  Recent Labs Lab 09/21/17 1547 09/22/17 1204 09/24/17 1115  NA 135 134* 133*  K 5.2* 5.1 5.8*  CL 105 101 104  CO2 22 21* 18*  GLUCOSE 94 139* 106*  BUN 40* 46* 66*  CREATININE 1.49* 1.81* 2.79*  CALCIUM 8.6* 8.8* 8.9  MG  --  2.0  --   PHOS  --  5.2*  --    GFR: Estimated Creatinine Clearance: 12.7 mL/min (A) (by C-G formula based on SCr of 2.79 mg/dL (H)). Liver Function Tests: No results for input(s): AST, ALT, ALKPHOS, BILITOT, PROT, ALBUMIN in the last 168 hours. No results for input(s): LIPASE, AMYLASE in the last 168 hours. No results for input(s): AMMONIA in the last 168 hours. Coagulation Profile:  Recent Labs Lab 09/21/17 1240  INR 1.11   Cardiac Enzymes: No results for input(s): CKTOTAL, CKMB, CKMBINDEX, TROPONINI in the last 168 hours. BNP (last 3 results)  Recent Labs  04/01/17 1142 04/28/17 1054  PROBNP 31,525* 3,325.0*   HbA1C: No results for input(s): HGBA1C in the last 72  hours. CBG: No results for input(s): GLUCAP in the last 168 hours. Lipid Profile: No results for input(s): CHOL, HDL, LDLCALC, TRIG, CHOLHDL, LDLDIRECT in the last 72 hours. Thyroid Function Tests: No results for input(s): TSH, T4TOTAL, FREET4, T3FREE, THYROIDAB in the last 72 hours. Anemia Panel: No results for input(s): VITAMINB12, FOLATE, FERRITIN, TIBC, IRON, RETICCTPCT in the last 72 hours.    Radiology Studies: I have reviewed all of the imaging during this hospital visit personally     Scheduled Meds: . feeding supplement  1 Container Oral BID BM  . feeding supplement (ENSURE ENLIVE)  237 mL Oral BID  . metoprolol tartrate  25 mg Oral BID  . sodium chloride flush  3 mL Intravenous Q12H   Continuous Infusions: . sodium chloride    . cefTRIAXone (ROCEPHIN)  IV 1 g (09/24/17 1817)     LOS: 4 days        Narda Fundora Gerome Apley, MD [facility] Pager [phone]

## 2017-09-26 ENCOUNTER — Inpatient Hospital Stay (HOSPITAL_COMMUNITY): Payer: Medicare Other

## 2017-09-26 NOTE — Progress Notes (Signed)
PROGRESS NOTE    Valerie Downs  ZYS:[id number] DOB: [birth year] DOA: 09/21/2017 PCP: Golden Circle, FNP    Brief Narrative:  83 year old female who presented with weakness. Patient does have significant past medical history of aortic insufficiency, mitral regurgitation, chronic blood loss anemia, diastolic heart failure, hypertension, chronic kidney disease and tobacco abuse. She complained of 3 days of worsening dyspnea, associated with lower extremity edema and orthopnea, no chest pain or frank angina. On initial physical examination 141/60, heart rate 75, respiratory 25, temperature 98.4, oxygen saturation 100%, moist mucous membranes, moderate JVD, lungs with decreased breath sounds bilaterally, bibasilar rales, heart S1-S2 present and rhythmic, positive systolic murmur 3/6, at the right parasternal region, no radiation, abdomen soft nontender, lower extremities with 3+ pitting edema bilaterally. Sodium 135, potassium 5.2, chloride 105, bicarbonate 22, glucose 94, BUN 40, creatinine 1.49, calcium 8.6, white count 15.9, hemoglobin 9.9, hematocrit 50.9, platelets 254,urinalysis with too numerous count white cells, 6-30 squamous epithelial cells, 100 protein, specific gravity 1.015, fecal occult was positive, INR 1.1, chest x-ray hypoinflated, cephalization of the vasculature, congested hilar regions, EKG sinus, rate of 80 bpm, left axis deviation, poor R-wave progression, positive PVCs in a bigeminy pattern.   Patient was admitted to hospital working diagnosis of decompensated heart failure, complicated by urinary tract infection present on admission.   Assessment & Plan:   Active Problems:   HYPERTENSION, BENIGN SYSTEMIC   Lower urinary tract infectious disease   Symptomatic anemia   Elevated troponin   Acute on chronic diastolic CHF (congestive heart failure) (HCC)   CKD (chronic kidney disease), stage III ([place])   1. Decompensated diastolic heart failure, acute on chronic. Will  continue B blockade with metoprolol, no ace inh due to risk of worsening renal function. Patient with moderate JVD and lower extremity edema, loud P2, symptoms likely due to pulmonary HTN, echocardiography with PA pressures peak at 60 mmHg with mildly dilatated RV and reduced ejection fraction. Will continue to hold on diuresis. Continue metoprolol for B blockade.   2. AKI on chronic kidney disease stage 3 with base cr 1.4 and calculated GFR 40.  Continue worsening renal function, will increase NS to 100 ml per hour and will place foley catheter to document strict urine out put, Serum K at 5.2 with bicarbonate at 20. Clinically still hypovolemic. Will avoid nephrotoxic medications or hypotension, will follow renal panel in am. May need nephrology consultation if continue worsening renal function.   3. Urinary tract infection, present on admission.  Will dc V ceftriaxone#5/5, completed antibiotic therapy, cultures no growth.  4. Deconditioning. Physical therapy as tolerated, Patient with poor oral intake.   DVT prophylaxis:heparin Code Status:full Family Communication: I spoke with patient's daughter at the bedside and all questions were addressed. Disposition Plan:snf   Consultants:  Cardiology   Procedures:    Antimicrobials:   Ceftriaxone dc 10/01   Subjective: Patient very deconditioned and weak, denies chest pain or dyspnea, no palpitations, no abdominal pain.   Objective: Vitals:   09/25/17 1109 09/25/17 1249 09/25/17 1923 09/26/17 0421  BP: 133/62 (!) 116/53 135/62 (!) 125/54  Pulse: 72 69 80 66  Resp:  20 20 20   Temp:  (!) 97.3 F (36.3 C) 98.4 F (36.9 C) (!) 97.4 F (36.3 C)  TempSrc:  Oral Oral Oral  SpO2: 94% 97% 94% 96%  Weight:    66.7 kg (147 lb 1.6 oz)  Height:        Intake/Output Summary (Last 24 hours) at 09/26/17  [place] filed at 09/26/17 0854  Gross per 24 hour  Intake           750.83 ml  Output                0 ml  Net            750.83 ml   Filed Weights   09/24/17 0400 09/25/17 0439 09/26/17 0421  Weight: 63.5 kg (140 lb 1.6 oz) 65.1 kg (143 lb 8 oz) 66.7 kg (147 lb 1.6 oz)    Examination:  General: Not in pain or dyspnea, deconditioned Neurology: Awake and alert, non focal  E ENT: positive pallor, no icterus, oral mucosa moist Cardiovascular: Moderate JVD. S1-S2 present, rhythmic, loud P2, no gallops, rubs, or murmurs. Trace lower extremity edema. Pulmonary: decreased breath sounds bilaterally at bases, adequate air movement, no wheezing, rhonchi or rales. Gastrointestinal. Abdomen flat, no organomegaly, non tender, no rebound or guarding Skin. No rashes Musculoskeletal: no joint deformities     Data Reviewed: I have personally reviewed following labs and imaging studies  CBC:  Recent Labs Lab 09/21/17 1119 09/22/17 1204 09/24/17 1115  WBC 8.5 15.9* 23.2*  NEUTROABS 6.4  --  21.0*  HGB 7.0* 9.9* 9.6*  HCT 23.9* 33.9* 32.4*  MCV 83.9 84.1 85.9  PLT 298 254 269   Basic Metabolic Panel:  Recent Labs Lab 09/21/17 1547 09/22/17 1204 09/24/17 1115 09/25/17 0958  NA 135 134* 133* 132*  K 5.2* 5.1 5.8* 5.2*  CL 105 101 104 100*  CO2 22 21* 18* 20*  GLUCOSE 94 139* 106* 122*  BUN 40* 46* 66* 69*  CREATININE 1.49* 1.81* 2.79* 3.09*  CALCIUM 8.6* 8.8* 8.9 8.6*  MG  --  2.0  --   --   PHOS  --  5.2*  --   --    GFR: Estimated Creatinine Clearance: 11.6 mL/min (A) (by C-G formula based on SCr of 3.09 mg/dL (H)). Liver Function Tests: No results for input(s): AST, ALT, ALKPHOS, BILITOT, PROT, ALBUMIN in the last 168 hours. No results for input(s): LIPASE, AMYLASE in the last 168 hours. No results for input(s): AMMONIA in the last 168 hours. Coagulation Profile:  Recent Labs Lab 09/21/17 1240  INR 1.11   Cardiac Enzymes: No results for input(s): CKTOTAL, CKMB, CKMBINDEX, TROPONINI in the last 168 hours. BNP (last 3 results)  Recent Labs  04/01/17 1142 04/28/17 1054    PROBNP 31,525* 3,325.0*   HbA1C: No results for input(s): HGBA1C in the last 72 hours. CBG: No results for input(s): GLUCAP in the last 168 hours. Lipid Profile: No results for input(s): CHOL, HDL, LDLCALC, TRIG, CHOLHDL, LDLDIRECT in the last 72 hours. Thyroid Function Tests: No results for input(s): TSH, T4TOTAL, FREET4, T3FREE, THYROIDAB in the last 72 hours. Anemia Panel: No results for input(s): VITAMINB12, FOLATE, FERRITIN, TIBC, IRON, RETICCTPCT in the last 72 hours.    Radiology Studies: I have reviewed all of the imaging during this hospital visit personally     Scheduled Meds: . feeding supplement  1 Container Oral BID BM  . feeding supplement (ENSURE ENLIVE)  237 mL Oral BID  . metoprolol tartrate  25 mg Oral BID  . sodium chloride flush  3 mL Intravenous Q12H   Continuous Infusions: . sodium chloride    . sodium chloride 100 mL/hr at 09/26/17 1108     LOS: 5 days        Kyarra Vancamp Gerome Apley, MD [facility] Pager [phone]

## 2017-09-26 NOTE — Progress Notes (Signed)
Occupational Therapy Treatment Patient Details Name: Valerie Downs MRN: [id number] DOB: 05-29-1930 Today's Date: 09/26/2017    History of present illness  83 y.o. female with medical history aortic insufficiency, mitral regurgitation, chronic blood loss anemia, chronic diastolic CHF, hypertension, chronic kidney disease, tobacco abuse  significant of breast cancer (s/p mastectomy 1984) , ovarian mass and enlarging lung mass /LAN  admitted wtih LE edema and dyspnea. Dx of acute on chronic CHF.   OT comments  Pt progressing towards established OT goals and continues to present with decreased activity tolerance and fatigues quickly during ADLs. Provided pt with education and handout on energy conservation. Pt performed grooming at sink while seatedv with set up and Min Guard A and required significant amount of time. Pt requesting to return to bed due to fatigue stating she is very tired. Continues to recommend dc with 24 hour supervision/assistance and HHOT to optimize pt's occupational performance participation. Will continues to follow to facilitate safe dc.    Follow Up Recommendations  Home health OT;Supervision/Assistance - 24 hour    Equipment Recommendations  Tub/shower bench;3 in 1 bedside commode    Recommendations for Other Services      Precautions / Restrictions Precautions Precautions: Fall Precaution Comments: pt denies falls at home Restrictions Weight Bearing Restrictions: No       Mobility Bed Mobility Overal bed mobility: Needs Assistance Bed Mobility: Supine to Sit;Sit to Supine     Supine to sit: Mod assist Sit to supine: Mod assist   General bed mobility comments: Needed incr time to come to EOB.  Mod A for LB to bring   Transfers Overall transfer level: Needs assistance Equipment used: Rolling walker (2 wheeled) Transfers: Sit to/from Stand Sit to Stand: Min assist         General transfer comment: From low chair at sink, pt required min assist to  get started.  Have to have assist to power up.     Balance Overall balance assessment: Needs assistance Sitting-balance support: Feet supported Sitting balance-Leahy Scale: Good     Standing balance support: Bilateral upper extremity supported;During functional activity Standing balance-Leahy Scale: Fair Standing balance comment: Pt must have outside support to walk but wtih walker did not lose balance.                           ADL either performed or assessed with clinical judgement   ADL Overall ADL's : Needs assistance/impaired     Grooming: Min guard;Oral care;Sitting;Cueing for sequencing Grooming Details (indicate cue type and reason): Pt performing oral care while seated at sink. Pt requiring significant amout of time to perform task and VCs to initiate. Pt stating she is too tired to stand for ADLs at sink.             Lower Body Dressing: Maximal assistance;Sit to/from stand Lower Body Dressing Details (indicate cue type and reason): Pt stating she can don socks but is too tired.             Functional mobility during ADLs: Min guard;Rolling walker General ADL Comments: Pt fatigues quickly with adls but is able to do them.  Pt requiring increased time to perform grooming, LB dressing, fucntional mobility, and bed mobility.  Provided pt with education and handout on energy conservation.      Vision   Vision Assessment?: No apparent visual deficits   Perception     Praxis      Cognition  Arousal/Alertness: Awake/alert Behavior During Therapy: WFL for tasks assessed/performed Overall Cognitive Status: No family/caregiver present to determine baseline cognitive functioning                                 General Comments: Pt requiring significant time to perform ADLs. Pt requiring VCs throughout to inititate tasks.SpO2 in low 90s on 1L O2.        Exercises     Shoulder Instructions       General Comments      Pertinent  Vitals/ Pain       Pain Assessment: No/denies pain  Home Living                                          Prior Functioning/Environment              Frequency  Min 2X/week        Progress Toward Goals  OT Goals(current goals can now be found in the care plan section)  Progress towards OT goals: Progressing toward goals  Acute Rehab OT Goals Patient Stated Goal: to have more energy OT Goal Formulation: With patient Time For Goal Achievement: 10/06/17 Potential to Achieve Goals: Good ADL Goals Pt Will Perform Grooming: with supervision;standing Pt Will Perform Lower Body Bathing: with supervision;sit to/from stand Pt Will Perform Lower Body Dressing: with supervision;sit to/from stand Additional ADL Goal #1: Pt will walk to bathroom with walker and toilet with S. Additional ADL Goal #2: Pt will state  3 things she can do at home to conserve more energy while doing adls with min VCs.  Plan Discharge plan remains appropriate    Co-evaluation                 AM-PAC PT "6 Clicks" Daily Activity     Outcome Measure   Help from another person eating meals?: None Help from another person taking care of personal grooming?: None Help from another person toileting, which includes using toliet, bedpan, or urinal?: A Little Help from another person bathing (including washing, rinsing, drying)?: A Little Help from another person to put on and taking off regular upper body clothing?: None Help from another person to put on and taking off regular lower body clothing?: A Little 6 Click Score: 21    End of Session Equipment Utilized During Treatment: Rolling walker  OT Visit Diagnosis: Unsteadiness on feet (R26.81);Other symptoms and signs involving cognitive function;Other abnormalities of gait and mobility (R26.89)   Activity Tolerance Patient limited by fatigue   Patient Left with call bell/phone within reach;in bed   Nurse Communication Mobility  status        Time: 3419-6222 OT Time Calculation (min): 29 min  Charges: OT General Charges $OT Visit: 1 Visit OT Treatments $Self Care/Home Management : 23-37 mins  [place], OTR/L Acute Rehab Pager: [phone] Office: [place] 09/26/2017, 9:37 AM

## 2017-09-26 NOTE — Progress Notes (Signed)
Progress Note  Patient Name: Valerie Downs Date of Encounter: 09/26/2017  Primary Cardiologist: Dr Angelena Form  Subjective   No specific complains. Still has intermittent dyspnea.   Inpatient Medications    Scheduled Meds: . feeding supplement  1 Container Oral BID BM  . feeding supplement (ENSURE ENLIVE)  237 mL Oral BID  . metoprolol tartrate  25 mg Oral BID  . sodium chloride flush  3 mL Intravenous Q12H   Continuous Infusions: . sodium chloride    . sodium chloride 100 mL/hr at 09/26/17 1108  . cefTRIAXone (ROCEPHIN)  IV 1 g (09/25/17 1736)   PRN Meds: sodium chloride, acetaminophen **OR** acetaminophen, ondansetron **OR** ondansetron (ZOFRAN) IV, sodium chloride flush   Vital Signs    Vitals:   09/25/17 1109 09/25/17 1249 09/25/17 1923 09/26/17 0421  BP: 133/62 (!) 116/53 135/62 (!) 125/54  Pulse: 72 69 80 66  Resp:  20 20 20   Temp:  (!) 97.3 F (36.3 C) 98.4 F (36.9 C) (!) 97.4 F (36.3 C)  TempSrc:  Oral Oral Oral  SpO2: 94% 97% 94% 96%  Weight:    147 lb 1.6 oz (66.7 kg)  Height:        Intake/Output Summary (Last 24 hours) at 09/26/17 1253 Last data filed at 09/26/17 0854  Gross per 24 hour  Intake           750.83 ml  Output                0 ml  Net           750.83 ml   Filed Weights   09/24/17 0400 09/25/17 0439 09/26/17 0421  Weight: 140 lb 1.6 oz (63.5 kg) 143 lb 8 oz (65.1 kg) 147 lb 1.6 oz (66.7 kg)    Telemetry    sr at 70s - Personally Reviewed  ECG    N/A - Personally Reviewed  Physical Exam   GEN: Chronically ill appearing elderly female o acute distress.   Neck: No JVD Cardiac: RRR,+ murmurs, rubs, or gallops.  Respiratory: Clear to auscultation bilaterally. GI: Soft, nontender, non-distended  MS: Trace  Edema bilaterally Neuro:  Nonfocal  Psych: Normal affect   Labs    Chemistry Recent Labs Lab 09/22/17 1204 09/24/17 1115 09/25/17 0958  NA 134* 133* 132*  K 5.1 5.8* 5.2*  CL 101 104 100*  CO2 21* 18* 20*    GLUCOSE 139* 106* 122*  BUN 46* 66* 69*  CREATININE 1.81* 2.79* 3.09*  CALCIUM 8.8* 8.9 8.6*  GFRNONAA 24* 14* 13*  GFRAA 28* 17* 15*  ANIONGAP 12 11 12      Hematology Recent Labs Lab 09/21/17 1119 09/22/17 1204 09/24/17 1115  WBC 8.5 15.9* 23.2*  RBC 2.85* 4.03 3.77*  HGB 7.0* 9.9* 9.6*  HCT 23.9* 33.9* 32.4*  MCV 83.9 84.1 85.9  MCH 24.6* 24.6* 25.5*  MCHC 29.3* 29.2* 29.6*  RDW 20.1* 18.9* 19.6*  PLT 298 254 254    Cardiac EnzymesNo results for input(s): TROPONINI in the last 168 hours.  Recent Labs Lab 09/21/17 1143  TROPIPOC 0.10*     BNP Recent Labs Lab 09/21/17 1223 09/24/17 1109  BNP >4,500.0* >4,500.0*     DDimer No results for input(s): DDIMER in the last 168 hours.   Radiology    US Renal  Result Date: 09/26/2017 CLINICAL DATA:  Renal failure. EXAM: RENAL / URINARY TRACT ULTRASOUND COMPLETE COMPARISON:  CT abdomen pelvis dated May 20, 2017. FINDINGS: Right Kidney: Length:  11.5 cm. Echogenicity within normal limits. No mass or hydronephrosis visualized. An 8 mm simple cyst is seen in the midpole. There is a 9 mm lower pole cystic lesion demonstrating low level internal echogenicity, consistent with a complicated cyst as seen on prior CT. Left Kidney: Length: 12.1 cm. Echogenicity within normal limits. No mass or hydronephrosis visualized. There is a 2.5 cm exophytic simple cyst arising from the upper pole. Bladder: Appears normal for degree of bladder distention. Bilateral pleural effusions and small ascites are noted. IMPRESSION: 1. No acute renal abnormality. 2. Bilateral pleural effusions and small ascites. Electronically Signed   By: Titus Dubin M.D.   On: 09/26/2017 12:12    Cardiac Studies   Eco 08/21/17 Study Conclusions  - Left ventricle: The cavity size was normal. There was severe focal basal and moderate concentric hypertrophy. Systolic function was normal. The estimated ejection fraction was in the range of 55% to 60%. Wall  motion was normal; there were no regional wall motion abnormalities. Features are consistent with a pseudonormal left ventricular filling pattern, with concomitant abnormal relaxation and increased filling pressure (grade 2 diastolic dysfunction). Doppler parameters are consistent with high ventricular filling pressure. - Aortic valve: Trileaflet; moderately thickened, mildly calcified leaflets. There was moderate regurgitation. - Aorta: Ascending aorta diameter: 41 mm (ED). - Ascending aorta: The ascending aorta was mildly dilated. - Mitral valve: Calcified annulus. There was moderate regurgitation directed eccentrically and toward the septum. - Left atrium: The atrium was severely dilated. - Right ventricle: The cavity size was mildly dilated. Wall thickness was normal. Systolic function was mildly reduced. - Right atrium: The atrium was severely dilated. - Pulmonic valve: There was moderate regurgitation. - Pulmonary arteries: PA peak pressure: 60 mm Hg (S). - Pericardium, extracardiac: A small, free-flowing pericardial effusion was identified circumferential to the heart. The fluid had no internal echoes.  Impressions:  - The right ventricular systolic pressure was increased consistent with moderate pulmonary hypertension.  Patient Profile     83 y.o. 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   Assessment & Plan    1. Acute on chronic diastolic CHF - BNP > 5465 in setting of acute anemia.  I & O inaccurate due to incontinence. Patient has intermittently refused labs also complicating management. Lasix hold yesterday due to worsening renal function. Despite fluids, SCr worsen today.  -  Recommended renal consult.   2. Elevated troponin - Demand in setting of acute anemia and CHF.  3. Recurrent symptomatic anemia - Contributing to her dyspnea. Evaluation per primary team.   4 Acute on chronic kidney disease, stage III - AS above.  Lab Results  Component Value Date   CREATININE 3.09 (H) 09/25/2017   CREATININE 2.79 (H) 09/24/2017   CREATININE 1.81 (H) 09/22/2017   5. Hyperkalemia - Per primary team  6. Leukocytosis - Per primary team   Dispo: She remains full code. Consider discussion of goals. Recommended renal consult.    For questions or updates, please contact [place] Please consult [email or web address] for contact info under Cardiology/STEMI.      Signed, Leanor Kail, PA  09/26/2017, 12:53 PM    I have examined the patient and reviewed assessment and plan and discussed with patient.  Agree with above as  stated.  Patient is quite frail with worsening renal function.  I think palliative care consult would be reasonable, with attention to goals of care and code status.  Nephrology consult would be reasonable as well given increasing Cr despite giving IV fluids.  Patient has refused labs reportedly, so thins makes titrating diuretic difficult.  Larae Grooms

## 2017-09-26 NOTE — Plan of Care (Signed)
Problem: Cardiac: Goal: Ability to achieve and maintain adequate cardiopulmonary perfusion will improve Outcome: Progressing Iv fluids going 50 ml/hr No urge to urinate, bladder scan 121cc.   Comments: CR- 181-3.09 in 3 days

## 2017-09-26 NOTE — Progress Notes (Signed)
PT Cancellation Note  Patient Details Name: BONNE WHACK MRN: [id number] DOB: [birth year]   Cancelled Treatment:    Reason Eval/Treat Not Completed: Other (comment) (refused.  Pt states she had been up earlier and was tired.)   Denice Paradise 09/26/2017, 2:35 PM  [place] Ivan Lacher,PT Acute Rehabilitation [phone] (pager)

## 2017-09-26 NOTE — Care Management Important Message (Signed)
Important Message  Patient Details  Name: Valerie Downs MRN: [id number] Date of Birth: 09-24-1930   Medicare Important Message Given:  Yes    Alani Sabbagh 09/26/2017, 1:18 PM

## 2017-09-26 NOTE — Plan of Care (Signed)
Problem: Fluid Volume: Goal: Ability to maintain a balanced intake and output will improve Outcome: Not Progressing Pt. Still with pitting edema in BLE, lower and upper legs

## 2017-09-27 LAB — BASIC METABOLIC PANEL
Anion gap: 13 (ref 5–15)
BUN: 70 mg/dL — ABNORMAL HIGH (ref 6–20)
CALCIUM: 8.8 mg/dL — AB (ref 8.9–10.3)
CHLORIDE: 102 mmol/L (ref 101–111)
CO2: 19 mmol/L — AB (ref 22–32)
CREATININE: 3.28 mg/dL — AB (ref 0.44–1.00)
GFR calc non Af Amer: 12 mL/min — ABNORMAL LOW (ref >60)
GFR, EST AFRICAN AMERICAN: 14 mL/min — AB (ref >60)
Glucose, Bld: 84 mg/dL (ref 65–99)
Potassium: 4.8 mmol/L (ref 3.5–5.1)
Sodium: 134 mmol/L — ABNORMAL LOW (ref 135–145)

## 2017-09-27 LAB — CBC
HEMATOCRIT: 36.8 % (ref 36.0–46.0)
HEMOGLOBIN: 11.1 g/dL — AB (ref 12.0–15.0)
MCH: 25.9 pg — ABNORMAL LOW (ref 26.0–34.0)
MCHC: 30.2 g/dL (ref 30.0–36.0)
MCV: 86 fL (ref 78.0–100.0)
Platelets: 238 10*3/uL (ref 150–400)
RBC: 4.28 MIL/uL (ref 3.87–5.11)
RDW: 20.2 % — AB (ref 11.5–15.5)
WBC: 27.3 10*3/uL — ABNORMAL HIGH (ref 4.0–10.5)

## 2017-09-27 MED ORDER — FUROSEMIDE 10 MG/ML IJ SOLN
120.0000 mg | Freq: Two times a day (BID) | INTRAVENOUS | Status: DC
  Administered 2017-09-27 – 2017-09-28 (×2): 120 mg via INTRAVENOUS
  Filled 2017-09-27 (×3): qty 12

## 2017-09-27 NOTE — Progress Notes (Signed)
Physical Therapy Treatment Patient Details Name: Valerie Downs MRN: [id number] DOB: September 19, 1930 Today's Date: 09/27/2017    History of Present Illness  83 y.o. female with medical history aortic insufficiency, mitral regurgitation, chronic blood loss anemia, chronic diastolic CHF, hypertension, chronic kidney disease, tobacco abuse  significant of breast cancer (s/p mastectomy 1984) , ovarian mass and enlarging lung mass /LAN  admitted wtih LE edema and dyspnea. Dx of acute on chronic CHF.    PT Comments    Patient agreeable to sit to stands X2 only this session. Pt required min/mod A +2 for safety with bed mobility and sit to stands. Pt was lethargic but easily aroused.  SpO2 desat to 86% on RA with mobility. 1L O2 via [place] donned end of session to maintain SpO2 >90%. Continue to progress as tolerated.   Follow Up Recommendations  Home health PT;Supervision/Assistance - 24 hour     Equipment Recommendations  None recommended by PT    Recommendations for Other Services       Precautions / Restrictions Precautions Precautions: Fall Restrictions Weight Bearing Restrictions: No    Mobility  Bed Mobility Overal bed mobility: Needs Assistance Bed Mobility: Supine to Sit;Sit to Supine     Supine to sit: Mod assist Sit to supine: Mod assist   General bed mobility comments: assist to bring hips toward EOB, to elevate trunk into sitting, and then to bring bilat LE into bed; cues for sequencing  Transfers Overall transfer level: Needs assistance Equipment used: Rolling walker (2 wheeled) Transfers: Sit to/from Stand Sit to Stand: Min assist;+2 safety/equipment         General transfer comment: assist to stand X2 with cues for hand placement and technique; pt maintained standing for brief periods before requesting to sit   Ambulation/Gait             General Gait Details: pt declined    Stairs            Wheelchair Mobility    Modified Rankin (Stroke Patients  Only)       Balance Overall balance assessment: Needs assistance Sitting-balance support: Feet supported Sitting balance-Leahy Scale: Good     Standing balance support: Bilateral upper extremity supported;During functional activity Standing balance-Leahy Scale: Poor                              Cognition Arousal/Alertness: Lethargic Behavior During Therapy: WFL for tasks assessed/performed Overall Cognitive Status: Impaired/Different from baseline Area of Impairment: Memory;Following commands;Problem solving;Safety/judgement                     Memory: Decreased short-term memory Following Commands: Follows one step commands with increased time Safety/Judgement: Decreased awareness of safety;Decreased awareness of deficits   Problem Solving: Slow processing;Decreased initiation;Requires verbal cues;Requires tactile cues General Comments: pt was lethargic but easily arousable      Exercises      General Comments General comments (skin integrity, edema, etc.): SpO2 desat to 86% on RA with transfers; pt on 1L O2 via [place] end of session and SpO2 92%      Pertinent Vitals/Pain Pain Assessment: Faces Faces Pain Scale: Hurts little more Pain Location: bilat LE in standing Pain Descriptors / Indicators: Sore Pain Intervention(s): Limited activity within patient's tolerance;Repositioned    Home Living                      Prior Function  PT Goals (current goals can now be found in the care plan section) Acute Rehab PT Goals PT Goal Formulation: With patient Time For Goal Achievement: 10/06/17 Potential to Achieve Goals: Good Progress towards PT goals: Not progressing toward goals - comment    Frequency    Min 3X/week      PT Plan Current plan remains appropriate    Co-evaluation              AM-PAC PT "6 Clicks" Daily Activity  Outcome Measure  Difficulty turning over in bed (including adjusting bedclothes,  sheets and blankets)?: A Lot Difficulty moving from lying on back to sitting on the side of the bed? : Unable Difficulty sitting down on and standing up from a chair with arms (e.g., wheelchair, bedside commode, etc,.)?: Unable Help needed moving to and from a bed to chair (including a wheelchair)?: A Lot Help needed walking in hospital room?: A Lot Help needed climbing 3-5 steps with a railing? : Total 6 Click Score: 9    End of Session Equipment Utilized During Treatment: Gait belt Activity Tolerance: Patient limited by lethargy Patient left: with call bell/phone within reach;in bed;with bed alarm set Nurse Communication: Mobility status PT Visit Diagnosis: Unsteadiness on feet (R26.81);Muscle weakness (generalized) (M62.81)     Time: 6433-2951 PT Time Calculation (min) (ACUTE ONLY): 29 min  Charges:  $Therapeutic Activity: 23-37 mins                    G Codes:       Earney Navy, PTA Pager: [phone]     Darliss Cheney 09/27/2017, 4:27 PM

## 2017-09-27 NOTE — Progress Notes (Signed)
Pt. Refusing am labs. MD paged to make aware.

## 2017-09-27 NOTE — Progress Notes (Signed)
Occupational Therapy Treatment Patient Details Name: Valerie Downs MRN: [id number] DOB: 10/09/1930 Today's Date: 09/27/2017    History of present illness  83 y.o. female with medical history aortic insufficiency, mitral regurgitation, chronic blood loss anemia, chronic diastolic CHF, hypertension, chronic kidney disease, tobacco abuse  significant of breast cancer (s/p mastectomy 1984) , ovarian mass and enlarging lung mass /LAN  admitted wtih LE edema and dyspnea. Dx of acute on chronic CHF.   OT comments  Focus of session on toileting and seated grooming. Pt with decreased activity tolerance and difficulty sequencing with with sit <> stand with premature sitting when transferring to chair. Pt is making slow progress.   Follow Up Recommendations  Home health OT;Supervision/Assistance - 24 hour    Equipment Recommendations  Tub/shower bench;3 in 1 bedside commode    Recommendations for Other Services      Precautions / Restrictions Precautions Precautions: Fall       Mobility Bed Mobility               General bed mobility comments: pt seated upon arrival  Transfers Overall transfer level: Needs assistance Equipment used: Rolling walker (2 wheeled) Transfers: Sit to/from Stand Sit to Stand: Min assist         General transfer comment: from bed and 3 in 1, cues for hand placement, steadying assist    Balance Overall balance assessment: Needs assistance   Sitting balance-Leahy Scale: Good       Standing balance-Leahy Scale: Poor Standing balance comment: pt with posterior lean and wide BoS when initially standing from 3 in 1                           ADL either performed or assessed with clinical judgement   ADL Overall ADL's : Needs assistance/impaired     Grooming: Wash/dry face;Oral care;Sitting;Minimal assistance                   Toilet Transfer: Minimal assistance;Stand-pivot;BSC;RW   Toileting- Clothing Manipulation and Hygiene:  Maximal assistance;Sit to/from stand Toileting - Clothing Manipulation Details (indicate cue type and reason): pt unable to release walker in standing for clean up after BM     Functional mobility during ADLs: Minimal assistance;Cueing for safety;Rolling walker General ADL Comments: pt attempts to sit prior to aligning body with the chair.     Vision       Perception     Praxis      Cognition Arousal/Alertness: Awake/alert Behavior During Therapy: WFL for tasks assessed/performed Overall Cognitive Status: Impaired/Different from baseline Area of Impairment: Memory;Following commands;Problem solving;Safety/judgement                     Memory: Decreased short-term memory Following Commands: Follows one step commands with increased time Safety/Judgement: Decreased awareness of safety (attempts to sit prematurely)   Problem Solving: Slow processing;Decreased initiation;Requires verbal cues General Comments: no family available to confirm prior cognitive status, questionable historian        Exercises     Shoulder Instructions       General Comments      Pertinent Vitals/ Pain       Pain Assessment: No/denies pain  Home Living                                          Prior  Functioning/Environment              Frequency  Min 2X/week        Progress Toward Goals  OT Goals(current goals can now be found in the care plan section)  Progress towards OT goals: Progressing toward goals  Acute Rehab OT Goals Patient Stated Goal: to have more energy OT Goal Formulation: With patient Time For Goal Achievement: 10/06/17 Potential to Achieve Goals: Good  Plan Discharge plan remains appropriate    Co-evaluation                 AM-PAC PT "6 Clicks" Daily Activity     Outcome Measure   Help from another person eating meals?: None Help from another person taking care of personal grooming?: A Little Help from another person  toileting, which includes using toliet, bedpan, or urinal?: A Lot Help from another person bathing (including washing, rinsing, drying)?: A Little Help from another person to put on and taking off regular upper body clothing?: None Help from another person to put on and taking off regular lower body clothing?: A Lot 6 Click Score: 18    End of Session Equipment Utilized During Treatment: Gait belt;Rolling walker  OT Visit Diagnosis: Unsteadiness on feet (R26.81);Other symptoms and signs involving cognitive function;Other abnormalities of gait and mobility (R26.89)   Activity Tolerance Patient tolerated treatment well   Patient Left in chair;with call bell/phone within reach   Nurse Communication          Time: 6503-5465 OT Time Calculation (min): 19 min  Charges: OT General Charges $OT Visit: 1 Visit OT Treatments $Self Care/Home Management : 8-22 mins  09/27/2017 Nestor Lewandowsky, OTR/L Pager: [phone]   Werner Lean Haze Boyden 09/27/2017, 9:52 AM

## 2017-09-27 NOTE — Progress Notes (Signed)
PT Cancellation Note  Patient Details Name: Valerie Downs MRN: [id number] DOB: December 05, 1930   Cancelled Treatment:    Reason Eval/Treat Not Completed: Patient declined, no reason specified Nursing staff just assisted pt back to bed. Pt did not open eyes when speaking to therapist. PT will check on pt later as time allows.    [place] April, PTA Pager: [phone]   09/27/2017, 11:58 AM

## 2017-09-27 NOTE — Progress Notes (Signed)
PROGRESS NOTE    Valerie Downs  OVZ:[id number] DOB: 1930/03/10 DOA: 09/21/2017 PCP: [place] [facility], FNP    Brief Narrative:  83 year old female who presented with weakness. Patient does have significant past medical history of aortic insufficiency, mitral regurgitation, chronic blood loss anemia, diastolic heart failure, hypertension, chronic kidney disease and tobacco abuse. She complained of 3 days of worsening dyspnea, associated with lower extremity edema and orthopnea, no chest pain or frank angina. On initial physical examination 141/60, heart rate 75, respiratory 25, temperature 98.4, oxygen saturation 100%, moist mucous membranes, moderate JVD, lungs with decreased breath sounds bilaterally, bibasilar rales, heart S1-S2 present and rhythmic, positive systolic murmur 3/6, at the right parasternal region, no radiation, abdomen soft nontender, lower extremities with 3+ pitting edema bilaterally. Sodium 135, potassium 5.2, chloride 105, bicarbonate 22, glucose 94, BUN 40, creatinine 1.49, calcium 8.6, white count 15.9, hemoglobin 9.9, hematocrit 50.9, platelets 254,urinalysis with too numerous count white cells, 6-30 squamous epithelial cells, 100 protein, specific gravity 1.015, fecal occult was positive, INR 1.1, chest x-ray hypoinflated, cephalization of the vasculature, congested hilar regions, EKG sinus, rate of 80 bpm, left axis deviation, poor R-wave progression, positive PVCs in a bigeminy pattern.   Patient was admitted to hospital working diagnosis of decompensated heart failure, complicated by urinary tract infection present on admission.   Assessment & Plan:   Active Problems:   HYPERTENSION, BENIGN SYSTEMIC   Lower urinary tract infectious disease   Symptomatic anemia   Elevated troponin   Acute on chronic diastolic CHF (congestive heart failure) (HCC)   CKD (chronic kidney disease), stage III ([place])   1. AKI on chronic kidney disease stage 3 with base cr 1.4 and  calculated GFR 40.  Worsening renal function, now with increased hydration with NS to 100 ml per hour, urine output not recorded, but er nursing up to 200 ml, foley catheter in place. Clinically continue to be dry, will hold antibiotic therapy, will need to follow on renal function today, patient refused am labs. No eosinophilia.   2. Decompensated diastolic heart failure, acute on chronic, pulmonary HTN, echocardiography with PA pressures peak at 60 mmHg with mildly dilatated RV and reduced ejection fraction. On B blockade with metoprolol, continue to hold on ace inh due to risk of worsening renal function. Persistent moderate JVD, with no frank lower extremity edema, loud P2.    3. Urinary tract infection, present on admission.  Completed 5 days of IV antibiotic therapy with IV ceftriaxone.    4. Deconditioning. Physical therapy as tolerated, very weak and deconditioned.   DVT prophylaxis:heparin Code Status:full Family Communication:no family at the bedside.    Consultants:  Cardiology   Procedures:    Antimicrobials:   Ceftriaxone dc 10/01   Subjective: Patient feeling very weak and deconditioned, no chest pain but positive dyspnea, persistent, no worsening or improving factors, no nausea or vomiting, no abdomina pain. Out of bed to the chair, foley catheter has been placed.   Objective: Vitals:   09/26/17 1300 09/26/17 2037 09/27/17 0612 09/27/17 0947  BP: (!) 148/56 (!) 138/59 (!) 146/59 (!) 145/62  Pulse: 67 71 73 83  Resp: 20 20 18    Temp: (!) 97.4 F (36.3 C) 97.7 F (36.5 C) (!) 97.5 F (36.4 C)   TempSrc: Oral Oral Oral   SpO2: 93% 91% 99%   Weight:   67.4 kg (148 lb 11.2 oz)   Height:        Intake/Output Summary (Last 24 hours) at 09/27/17  [place] filed at 09/27/17 0844  Gross per 24 hour  Intake          2263.33 ml  Output                0 ml  Net          2263.33 ml   Filed Weights   09/25/17 0439 09/26/17 0421 09/27/17 0612    Weight: 65.1 kg (143 lb 8 oz) 66.7 kg (147 lb 1.6 oz) 67.4 kg (148 lb 11.2 oz)    Examination:  General: Not in pain, mild dyspnea, deconditioned Neurology: Awake and alert, non focal  E ENT: mild pallor, no icterus, oral mucosa moist Cardiovascular: moderate JVD. S1-S2 present, rhythmic, loud P2, no gallops, rubs, or murmurs. No lower extremity edema. Pulmonary: decreased breath sounds bilaterally, adequate air movement at the apical, no wheezing, rhonchi, positive bibasilar rales. Gastrointestinal. Abdomen flat, no organomegaly, non tender, no rebound or guarding Skin. No rashes Musculoskeletal: no joint deformities     Data Reviewed: I have personally reviewed following labs and imaging studies  CBC:  Recent Labs Lab 09/21/17 1119 09/22/17 1204 09/24/17 1115  WBC 8.5 15.9* 23.2*  NEUTROABS 6.4  --  21.0*  HGB 7.0* 9.9* 9.6*  HCT 23.9* 33.9* 32.4*  MCV 83.9 84.1 85.9  PLT 298 254 381   Basic Metabolic Panel:  Recent Labs Lab 09/21/17 1547 09/22/17 1204 09/24/17 1115 09/25/17 0958  NA 135 134* 133* 132*  K 5.2* 5.1 5.8* 5.2*  CL 105 101 104 100*  CO2 22 21* 18* 20*  GLUCOSE 94 139* 106* 122*  BUN 40* 46* 66* 69*  CREATININE 1.49* 1.81* 2.79* 3.09*  CALCIUM 8.6* 8.8* 8.9 8.6*  MG  --  2.0  --   --   PHOS  --  5.2*  --   --    GFR: Estimated Creatinine Clearance: 11.7 mL/min (A) (by C-G formula based on SCr of 3.09 mg/dL (H)). Liver Function Tests: No results for input(s): AST, ALT, ALKPHOS, BILITOT, PROT, ALBUMIN in the last 168 hours. No results for input(s): LIPASE, AMYLASE in the last 168 hours. No results for input(s): AMMONIA in the last 168 hours. Coagulation Profile:  Recent Labs Lab 09/21/17 1240  INR 1.11   Cardiac Enzymes: No results for input(s): CKTOTAL, CKMB, CKMBINDEX, TROPONINI in the last 168 hours. BNP (last 3 results)  Recent Labs  04/01/17 1142 04/28/17 1054  PROBNP 31,525* 3,325.0*   HbA1C: No results for input(s):  HGBA1C in the last 72 hours. CBG: No results for input(s): GLUCAP in the last 168 hours. Lipid Profile: No results for input(s): CHOL, HDL, LDLCALC, TRIG, CHOLHDL, LDLDIRECT in the last 72 hours. Thyroid Function Tests: No results for input(s): TSH, T4TOTAL, FREET4, T3FREE, THYROIDAB in the last 72 hours. Anemia Panel: No results for input(s): VITAMINB12, FOLATE, FERRITIN, TIBC, IRON, RETICCTPCT in the last 72 hours.    Radiology Studies: I have reviewed all of the imaging during this hospital visit personally     Scheduled Meds: . feeding supplement  1 Container Oral BID BM  . feeding supplement (ENSURE ENLIVE)  237 mL Oral BID  . metoprolol tartrate  25 mg Oral BID  . sodium chloride flush  3 mL Intravenous Q12H   Continuous Infusions: . sodium chloride    . sodium chloride 100 mL/hr at 09/27/17 8299     LOS: 6 days        Marquesha Robideau Gerome Apley, MD [facility] Pager [phone]

## 2017-09-27 NOTE — Consult Note (Signed)
[facility] Consultation Note Requesting Physician:  Dr. Cathlean Sauer Reason for Consult:  AKI on CKD  HPI: The patient is a 83 y.o. 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 H/o recurrent episodes of decompensated dCHF. Also has recurring blood loss anemia, enlarging lung mass and possible ovarian CA. Admitted with feeling of unwell (can't really tell me why she came to the hospital but notes indicated SOB/swelling). Creatinine on admission around 1.5.   It was noted that at cardiology OV 08/30/17 that pt weighed 127 lb and had her lasix dose increased from 40->80 mg/day. On admission to the hospital she weighed 138 lb and despite being "diuresed" with 60 IV BID, then 40 po BID weight has steadily increased to today's weight of 148 lb. Because her creatinine started rising her lasix was stopped 9/28 and she has been getting IVF ever since. We are asked to see because of worsening renal fx.   Pt says she is "more short of breath than she started with" but can't be much more specific about anything. Not a good historian.   Creatinine summary Date/Time Value  09/27/2017 02:24 PM 3.28 (H)  09/25/2017 09:58 AM 3.09 (H)  09/24/2017 11:15 AM 2.79 (H)  09/22/2017 12:04 PM 1.81 (H)  09/21/2017 03:47 PM 1.49 (H)  09/11/2017 03:37 PM 2.05 (H)  08/26/2017 03:18 PM 1.34 (H)  08/21/2017 05:09 AM 1.34 (H)  08/20/2017 11:05 AM 1.41 (H)  08/19/2017 08:14 PM 1.35 (H)  05/20/2017 04:04 PM 1.76 (H)  05/19/2017 04:21 PM 1.74 (H)  04/28/2017 10:54 AM 1.45 (H)  04/07/2017 02:50 PM 1.39 (H)  04/05/2017 10:33 AM 1.17 (H)  04/01/2017 11:42 AM 1.25 (H)      Past Medical History:  Diagnosis Date  . Anemia   . Aortic insufficiency   . Blood transfusion without reported diagnosis   . Breast cancer ([place])   . Cardiomyopathy ([place])   . CHF (congestive heart failure) ([place])   . CKD (chronic kidney disease), stage III   . History of echocardiogram    Echo 4/18: severe focal basal and mod conc LVH,  EF 50-55, no RWMA, Gr 2 DD, mod AI, severe MR, mild LAE, mildly reduced RVSF, severe TR, mod PI, PASP 65, mild pericardial eff  . Hypertension   . Mitral regurgitation     Past Surgical History:  Procedure Laterality Date  . COLONOSCOPY N/A 06/22/2014   Procedure: COLONOSCOPY;  Surgeon: Jerene Bears, MD;  Location: [facility] ENDOSCOPY;  Service: Endoscopy;  Laterality: N/A;  . ENDOBRONCHIAL ULTRASOUND N/A 12/31/2016   Procedure: ENDOBRONCHIAL ULTRASOUND;  Surgeon: Juanito Doom, MD;  Location: [facility] ENDOSCOPY;  Service: Cardiopulmonary;  Laterality: N/A;  . ESOPHAGOGASTRODUODENOSCOPY N/A 06/22/2014   Procedure: ESOPHAGOGASTRODUODENOSCOPY (EGD);  Surgeon: Jerene Bears, MD;  Location: [facility];  Service: Endoscopy;  Laterality: N/A;  . MASTECTOMY Left     Family History  Problem Relation Age of Onset  . Hypertension Mother   . Cancer Father    Social History:  reports that she has been smoking Cigarettes.  She has a 12.50 pack-year smoking history. She has quit using smokeless tobacco. Her smokeless tobacco use included Snuff. She reports that she does not drink alcohol or use drugs.  :  Allergies  Allergen Reactions  . Penicillins Anaphylaxis and Swelling    Has patient had a PCN reaction causing immediate rash, facial/tongue/throat swelling, SOB or lightheadedness with hypotension: Yes Has patient had a PCN reaction causing severe rash involving mucus membranes  or skin necrosis: Yes Has patient had a PCN reaction that required hospitalization Yes Has patient had a PCN reaction occurring within the last 10 years: No If all of the above answers are "NO", then may proceed with Cephalosporin use.     Home medications: Prior to Admission medications   Medication Sig Start Date End Date Taking? Authorizing Provider  feeding supplement (BOOST / RESOURCE BREEZE) LIQD Take 1 Container by mouth 2 (two) times daily between meals. 05/21/17  Yes Sheikh, [place], DO  feeding supplement, ENSURE  ENLIVE, (ENSURE ENLIVE) LIQD Take 237 mLs by mouth daily. 05/21/17  Yes Sheikh, Omair Latif, DO  furosemide (LASIX) 40 MG tablet Take 2 tablets (80 mg total) by mouth daily. 08/30/17  Yes Imogene Burn, PA-C  metoprolol tartrate (LOPRESSOR) 25 MG tablet Take 1 tablet (25 mg total) by mouth 2 (two) times daily. 06/09/16  Yes Burtis Junes, NP  ciprofloxacin (CIPRO) 250 MG tablet Take 1 tablet (250 mg total) by mouth every 12 (twelve) hours. Patient not taking: Reported on 09/21/2017 09/11/17   Daleen Bo, MD    Inpatient medications: . feeding supplement  1 Container Oral BID BM  . feeding supplement (ENSURE ENLIVE)  237 mL Oral BID  . metoprolol tartrate  25 mg Oral BID  . sodium chloride flush  3 mL Intravenous Q12H    Review of Systems Pt nebulous + SOB + "swollen" + "tired of all you doctors"  Physical Exam:  Blood pressure (!) 152/61, pulse 72, temperature (!) 97.5 F (36.4 C), temperature source Oral, resp. rate 18, height 5\' 1"  (1.549 m), weight 67.4 kg (148 lb 11.2 oz), SpO2 100 %.  Gen: Seen lying in bed Very cranky that I was bothering her VS as noted Generalized anasarca with pitting edema to the hips, and of abdominal wall, sacral and axillary regions Skin: no rash, cyanosis Neck: + JVD to jaw angle Chest: Crackles both lung bases Heart: [id number]  6-3/0 systolic murmur LSB 1/6 diastolic murmur ULSB Abd obese, pitting edema abdominal wall No focal tenderness 2+ edema knees to hips 1+ below knees   Recent Labs Lab 09/21/17 1547 09/22/17 1204 09/24/17 1115 09/25/17 0958 09/27/17 1424  NA 135 134* 133* 132* 134*  K 5.2* 5.1 5.8* 5.2* 4.8  CL 105 101 104 100* 102  CO2 22 21* 18* 20* 19*  GLUCOSE 94 139* 106* 122* 84  BUN 40* 46* 66* 69* 70*  CREATININE 1.49* 1.81* 2.79* 3.09* 3.28*  CALCIUM 8.6* 8.8* 8.9 8.6* 8.8*  PHOS  --  5.2*  --   --   --      Recent Labs Lab 09/21/17 1119 09/22/17 1204 09/24/17 1115 09/27/17 1216  WBC 8.5 15.9* 23.2* 27.3*   NEUTROABS 6.4  --  21.0*  --   HGB 7.0* 9.9* 9.6* 11.1*  HCT 23.9* 33.9* 32.4* 36.8  MCV 83.9 84.1 85.9 86.0  PLT 298 254 254 238   Xrays/Other Studies: US Renal  Result Date: 09/26/2017 CLINICAL DATA:  Renal failure. EXAM: RENAL / URINARY TRACT ULTRASOUND COMPLETE COMPARISON:  CT abdomen pelvis dated May 20, 2017. FINDINGS: Right Kidney: Length: 11.5 cm. Echogenicity within normal limits. No mass or hydronephrosis visualized. An 8 mm simple cyst is seen in the midpole. There is a 9 mm lower pole cystic lesion demonstrating low level internal echogenicity, consistent with a complicated cyst as seen on prior CT. Left Kidney: Length: 12.1 cm. Echogenicity within normal limits. No mass or hydronephrosis visualized. There is  a 2.5 cm exophytic simple cyst arising from the upper pole. Bladder: Appears normal for degree of bladder distention. Bilateral pleural effusions and small ascites are noted. IMPRESSION: 1. No acute renal abnormality. 2. Bilateral pleural effusions and small ascites. Electronically Signed   By: Titus Dubin M.D.   On: 09/26/2017 12:12    Background:  83 y.o. 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 H/o recurrent episodes of decompensated dCHF. Also recurring blood loss anemia, enlarging lung mass and possible ovarian CA. Admitted with feeling of unwell (can't really tell my why she came to the hospital but notes indicated SOB/swelling). Creatinine on admission around 1.5, fluid overloaded. Rec'd modest doses lasix, stopped d/t worsening renal function, but on review of weights pt up 10 lb since admission, 20 lb since 08/30/17 cardiology clinic visit and has been receiving IVF's. We were asked to see.   Assessment/Recommendations  1. AKI on CKD3 - pt with NO evidence of "overdiuresis" and at this point in time generalized anasarca with 10 lb weight gain this hospitalization, 20 lb since last cards clinic visit. Recommend stopping IVF's, resuming IV  lasix for her 20+ lb of extra volume. Foley for UOP monitoring. Worsening HF could be the culprit cause of her worsening renal fx but to my eye not related to diuretics. Given her age and significant comorbids would be a very poor candidate for dialysis and would probably not offer (in fact she would probably not accept) 2. Anasarca - see #1 3. Acute on chronic diastolic HF - needs diuresis 4. AI/MR/PAH - per cardiology 5. Anemia -s/p 2 unit transfusion this admission. Current Hb ~ 11. Needs Fe studies - ordered 6. Possible ovarian CA - has declined workup 7. Leukocytosis - worsening. Treated for UTI on admission, Evaluation by primary team.   Will follow.   Jamal Maes,  MD [facility] [phone] pager 09/27/2017, 5:40 PM

## 2017-09-28 LAB — RENAL FUNCTION PANEL
Albumin: 1.8 g/dL — ABNORMAL LOW (ref 3.5–5.0)
Anion gap: 13 (ref 5–15)
BUN: 71 mg/dL — ABNORMAL HIGH (ref 6–20)
CHLORIDE: 102 mmol/L (ref 101–111)
CO2: 19 mmol/L — AB (ref 22–32)
Calcium: 8.8 mg/dL — ABNORMAL LOW (ref 8.9–10.3)
Creatinine, Ser: 3.17 mg/dL — ABNORMAL HIGH (ref 0.44–1.00)
GFR calc non Af Amer: 12 mL/min — ABNORMAL LOW (ref >60)
GFR, EST AFRICAN AMERICAN: 14 mL/min — AB (ref >60)
Glucose, Bld: 53 mg/dL — ABNORMAL LOW (ref 65–99)
Phosphorus: 7.1 mg/dL — ABNORMAL HIGH (ref 2.5–4.6)
Potassium: 5.2 mmol/L — ABNORMAL HIGH (ref 3.5–5.1)
Sodium: 134 mmol/L — ABNORMAL LOW (ref 135–145)

## 2017-09-28 LAB — IRON AND TIBC
Iron: 9 ug/dL — ABNORMAL LOW (ref 28–170)
SATURATION RATIOS: 3 % — AB (ref 10.4–31.8)
TIBC: 267 ug/dL (ref 250–450)
UIBC: 258 ug/dL

## 2017-09-28 LAB — CBC
HCT: 36.2 % (ref 36.0–46.0)
Hemoglobin: 10.6 g/dL — ABNORMAL LOW (ref 12.0–15.0)
MCH: 25.3 pg — ABNORMAL LOW (ref 26.0–34.0)
MCHC: 29.3 g/dL — ABNORMAL LOW (ref 30.0–36.0)
MCV: 86.4 fL (ref 78.0–100.0)
PLATELETS: 230 10*3/uL (ref 150–400)
RBC: 4.19 MIL/uL (ref 3.87–5.11)
RDW: 20.3 % — ABNORMAL HIGH (ref 11.5–15.5)
WBC: 24.7 10*3/uL — AB (ref 4.0–10.5)

## 2017-09-28 MED ORDER — FUROSEMIDE 10 MG/ML IJ SOLN
160.0000 mg | Freq: Three times a day (TID) | INTRAVENOUS | Status: DC
  Administered 2017-09-28 – 2017-10-02 (×12): 160 mg via INTRAVENOUS
  Filled 2017-09-28: qty 10
  Filled 2017-09-28 (×3): qty 16
  Filled 2017-09-28 (×2): qty 10
  Filled 2017-09-28 (×3): qty 16
  Filled 2017-09-28 (×2): qty 10
  Filled 2017-09-28 (×4): qty 16

## 2017-09-28 MED ORDER — SODIUM CHLORIDE 0.9 % IV SOLN
510.0000 mg | INTRAVENOUS | Status: DC
  Administered 2017-09-28: 510 mg via INTRAVENOUS
  Filled 2017-09-28: qty 17

## 2017-09-28 NOTE — Progress Notes (Signed)
Patient refused  Medicine this morning, offered 3x but still refused, verbalized "leave me alone, leave!go out". Will try to offer again later today.

## 2017-09-28 NOTE — Progress Notes (Signed)
[facility] Rounding Note  Subjective/Interval History:  No real change since saw her yesterday Just very tired, would not work with PT d/t fatigue  Objective Vital signs in last 24 hours: Vitals:   09/27/17 1323 09/27/17 1930 09/28/17 0600 09/28/17 1156  BP: (!) 152/61 (!) 149/58 (!) 145/54 (!) 135/56  Pulse: 72 82 75 86  Resp:  18 18 20   Temp:  97.7 F (36.5 C)  (!) 97.5 F (36.4 C)  TempSrc:  Oral  Axillary  SpO2: 100% 99% 95% 99%  Weight:   68 kg (150 lb)   Height:       Weight change: 0.59 kg (1 lb 4.8 oz)  Intake/Output Summary (Last 24 hours) at 09/28/17 1205 Last data filed at 09/28/17 1045  Gross per 24 hour  Intake              364 ml  Output              900 ml  Net             -536 ml   Physical Exam:  Blood pressure (!) 135/56, pulse 86, temperature (!) 97.5 F (36.4 C), temperature source Axillary, resp. rate 20, height 5\' 1"  (1.549 m), weight 68 kg (150 lb), SpO2 99 %.  Seen lying in bed VS as noted No change in exam overnight Generalized anasarca with pitting edema to the hips, and of abdominal wall, sacral and axillary regions + JVD to jaw angle Crackles both lung bases [id number]  9-6/0 systolic murmur LSB 1/6 diastolic murmur ULSB Abd obese, pitting edema abdominal wall No focal tenderness 2+ edema knees to hips 1+ below knees  Labs: Basic Metabolic Panel:  Recent Labs Lab 09/21/17 1547 09/22/17 1204 09/24/17 1115 09/25/17 0958 09/27/17 1424 09/28/17 0335  NA 135 134* 133* 132* 134* 134*  K 5.2* 5.1 5.8* 5.2* 4.8 5.2*  CL 105 101 104 100* 102 102  CO2 22 21* 18* 20* 19* 19*  GLUCOSE 94 139* 106* 122* 84 53*  BUN 40* 46* 66* 69* 70* 71*  CREATININE 1.49* 1.81* 2.79* 3.09* 3.28* 3.17*  CALCIUM 8.6* 8.8* 8.9 8.6* 8.8* 8.8*  PHOS  --  5.2*  --   --   --  7.1*    Recent Labs Lab 09/28/17 0335  ALBUMIN 1.8*    Recent Labs Lab 09/22/17 1204 09/24/17 1115 09/27/17 1216 09/28/17 0335  WBC 15.9* 23.2* 27.3* 24.7*  NEUTROABS  --  21.0*  --    --   HGB 9.9* 9.6* 11.1* 10.6*  HCT 33.9* 32.4* 36.8 36.2  MCV 84.1 85.9 86.0 86.4  PLT 254 254 238 230    Iron/TIBC/Ferritin/ %Sat    Component Value Date/Time   IRON 9 (L) 09/28/2017 0335   TIBC 267 09/28/2017 0335   FERRITIN 41 07/06/2016 1956   IRONPCTSAT 3 (L) 09/28/2017 0335    Medications: . sodium chloride    . furosemide Stopped (09/28/17 0723)   . feeding supplement  1 Container Oral BID BM  . feeding supplement (ENSURE ENLIVE)  237 mL Oral BID  . metoprolol tartrate  25 mg Oral BID  . sodium chloride flush  3 mL Intravenous Q12H    Background: 83 y.o. 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 H/o recurrent episodes of decompensated dCHF. Also recurring blood loss anemia, enlarging lung mass and possible ovarian CA. Admitted 09/21/17  with feeling of unwell (can't really tell my why she came to  the hospital but notes indicated SOB/swelling). Creatinine on admission around 1.5, fluid overloaded. Rec'd modest doses lasix, stopped d/t worsening renal function, but on review of weights pt up 10 lb since admission, 20 lb since 08/30/17 cardiology clinic visit and has been receiving IVF's. We were asked to see 10/2  Assessment/Recommendations 1. AKI on CKD3 - pt with NO evidence of "overdiuresis" and currently generalized anasarca with 10 lb weight gain this hospitalization, 20 lb since last cards clinic visit on 9.4  IVF's stopped. Minimal UOP with 120 lasix Q12H so will bump dose. She is markedly volume overloaded. Continue foley for UOP monitoring. Worsening HF could well have been been the culprit cause of her worsening renal fx (but to my eye not related to diuretics). Given her age and significant comorbids would be a very poor candidate for dialysis and would probably not offer (in fact she would probably not accept) 2. Anasarca - see #1 3. Hyperkalemia - change diet to renal (for K restriction) 4. Acute on chronic diastolic HF - needs a lot of  diuresis - ^ lasix to 160 TID IV 5. AI/MR/PAH - per cardiology 6. Anemia -s/p 2 unit transfusion this admission. Current Hb ~ 11. EXTREMELY low tsat and fe. Dose with Feraheme X2. 1st dose today 10/3 7. Possible ovarian CA - has declined workup 8. Leukocytosis - worsening. Treated for UTI on admission.  Evaluation by primary team.   Jamal Maes, MD [facility] [phone] Pager 09/28/2017, 12:09 PM

## 2017-09-28 NOTE — Progress Notes (Addendum)
PROGRESS NOTE        PATIENT DETAILS Name: Valerie Downs Age: 83 y.o. Sex: female Date of Birth: 1930-02-17 Admit Date: 09/21/2017 Admitting Physician Waldemar Dickens, MD PYK:DXIPJA, Ples Specter, FNP  Brief Narrative: Patient is a 83 y.o. female with history of chronic diastolic heart failure, hypertrophic cardiomyopathy, pulmonary arterial hypertension, admitted with fatigue-thought to be secondary to acute and chronic diastolic heart failure, and worsening renal function. See below for further details  Subjective: Sleeping-this morning-no complaints. Claims she is feeling better.  Assessment/Plan: Acute kidney injury on stage III chronic kidney disease: Thought to be secondary to cardiorenal syndrome, nephrology following and guiding diuretic regimen. Renal function is slowly improving.  Mild hyperkalemia: Nephrology following-diet has been changed to renal-she is on Lasix, repeat electrolytes tomorrow.  Acute on chronic diastolic heart failure: Volume status to overload-slowly improving-Lasix or nephrology  Minimally elevated troponins: Trend is flat and not consistent with ACS-cardiology following.  Pulmonary hypertension: Likely secondary to left-sided heart failure-further workup in the outpatient setting  UTI: Urine culture with multiple bacterial morphotypes-completed antimicrobial therapy. Remains afebrile and nontoxic appearing-although has significant leukocytosis  Anemia: Hemoglobin currently stable-apparently has a history of chronic blood loss anemia. Plans are to follow at this time.  Leukocytosis: Appears to have significant leukocytosis-no obvious foci of infection at this time-completed a few days of IV Rocephin as outlined above-plans are to follow, if febrile-we will repeat cultures.  Deconditioning: Secondary to frailty-worsened by acute illness-PT recommended home health services  Left adnexal mass: Per prior notes-patient has refused  workup in the past.  Morning labs/Imaging ordered: yes  DVT Prophylaxis: SCD's  Code Status: Full code   Family Communication: None at bedside  Disposition Plan: Remain inpatient  Antimicrobial agents: Anti-infectives    Start     Dose/Rate Route Frequency Ordered Stop   09/21/17 1900  cefTRIAXone (ROCEPHIN) 1 g in dextrose 5 % 50 mL IVPB  Status:  Discontinued     1 g 100 mL/hr over 30 Minutes Intravenous Every 24 hours 09/21/17 1817 09/26/17 1304      Procedures: None  CONSULTS:  cardiology and nephrology  Time spent: 25 minutes-Greater than 50% of this time was spent in counseling, explanation of diagnosis, planning of further management, and coordination of care.  MEDICATIONS: Scheduled Meds: . feeding supplement  1 Container Oral BID BM  . feeding supplement (ENSURE ENLIVE)  237 mL Oral BID  . metoprolol tartrate  25 mg Oral BID  . sodium chloride flush  3 mL Intravenous Q12H   Continuous Infusions: . sodium chloride    . ferumoxytol 510 mg (09/28/17 1500)  . furosemide Stopped (09/28/17 1439)   PRN Meds:.sodium chloride, acetaminophen **OR** acetaminophen, ondansetron **OR** ondansetron (ZOFRAN) IV, sodium chloride flush   PHYSICAL EXAM: Vital signs: Vitals:   09/27/17 1323 09/27/17 1930 09/28/17 0600 09/28/17 1156  BP: (!) 152/61 (!) 149/58 (!) 145/54 (!) 135/56  Pulse: 72 82 75 86  Resp:  18 18 20   Temp:  97.7 F (36.5 C)  (!) 97.5 F (36.4 C)  TempSrc:  Oral  Axillary  SpO2: 100% 99% 95% 99%  Weight:   68 kg (150 lb)   Height:       Filed Weights   09/26/17 0421 09/27/17 0612 09/28/17 0600  Weight: 66.7 kg (147 lb 1.6 oz) 67.4 kg (148 lb 11.2 oz)  68 kg (150 lb)   Body mass index is 28.34 kg/m.   General appearance :Awake, alert, not in any distress. Speech Clear.  Eyes:, pupils equally reactive to light and accomodation,no scleral icterus. HEENT: Atraumatic and Normocephalic Neck: supple, no JVD. No cervical lymphadenopathy.    Resp:Good air entry bilaterally CVS: S1 S2 regular-+ systolic murmur  GI: Bowel sounds present, Non tender and not distended with no gaurding, rigidity or rebound.No organomegaly Extremities: B/L Lower Ext shows 1+ edema Neurology:  speech clear,Non focal, sensation is grossly intact. Psychiatric: Normal judgment and insight. Alert and oriented x 3. Normal mood. Musculoskeletal:No digital cyanosis Skin:No Rash, warm and dry Wounds:N/A  I have personally reviewed following labs and imaging studies  LABORATORY DATA: CBC:  Recent Labs Lab 09/22/17 1204 09/24/17 1115 09/27/17 1216 09/28/17 0335  WBC 15.9* 23.2* 27.3* 24.7*  NEUTROABS  --  21.0*  --   --   HGB 9.9* 9.6* 11.1* 10.6*  HCT 33.9* 32.4* 36.8 36.2  MCV 84.1 85.9 86.0 86.4  PLT 254 254 238 093    Basic Metabolic Panel:  Recent Labs Lab 09/22/17 1204 09/24/17 1115 09/25/17 0958 09/27/17 1424 09/28/17 0335  NA 134* 133* 132* 134* 134*  K 5.1 5.8* 5.2* 4.8 5.2*  CL 101 104 100* 102 102  CO2 21* 18* 20* 19* 19*  GLUCOSE 139* 106* 122* 84 53*  BUN 46* 66* 69* 70* 71*  CREATININE 1.81* 2.79* 3.09* 3.28* 3.17*  CALCIUM 8.8* 8.9 8.6* 8.8* 8.8*  MG 2.0  --   --   --   --   PHOS 5.2*  --   --   --  7.1*    GFR: Estimated Creatinine Clearance: 11.4 mL/min (A) (by C-G formula based on SCr of 3.17 mg/dL (H)).  Liver Function Tests:  Recent Labs Lab 09/28/17 0335  ALBUMIN 1.8*   No results for input(s): LIPASE, AMYLASE in the last 168 hours. No results for input(s): AMMONIA in the last 168 hours.  Coagulation Profile: No results for input(s): INR, PROTIME in the last 168 hours.  Cardiac Enzymes: No results for input(s): CKTOTAL, CKMB, CKMBINDEX, TROPONINI in the last 168 hours.  BNP (last 3 results)  Recent Labs  04/01/17 1142 04/28/17 1054  PROBNP 31,525* 3,325.0*    HbA1C: No results for input(s): HGBA1C in the last 72 hours.  CBG: No results for input(s): GLUCAP in the last 168  hours.  Lipid Profile: No results for input(s): CHOL, HDL, LDLCALC, TRIG, CHOLHDL, LDLDIRECT in the last 72 hours.  Thyroid Function Tests: No results for input(s): TSH, T4TOTAL, FREET4, T3FREE, THYROIDAB in the last 72 hours.  Anemia Panel:  Recent Labs  09/28/17 0335  TIBC 267  IRON 9*    Urine analysis:    Component Value Date/Time   COLORURINE AMBER (A) 09/21/2017 1404   APPEARANCEUR CLOUDY (A) 09/21/2017 1404   LABSPEC 1.015 09/21/2017 1404   PHURINE 5.0 09/21/2017 1404   GLUCOSEU NEGATIVE 09/21/2017 1404   HGBUR SMALL (A) 09/21/2017 1404   BILIRUBINUR NEGATIVE 09/21/2017 1404   KETONESUR NEGATIVE 09/21/2017 1404   PROTEINUR 100 (A) 09/21/2017 1404   UROBILINOGEN 0.2 08/16/2014 0535   NITRITE NEGATIVE 09/21/2017 1404   LEUKOCYTESUR LARGE (A) 09/21/2017 1404    Sepsis Labs: Lactic Acid, Venous    Component Value Date/Time   LATICACIDVEN 1.28 01/01/2017 1603    MICROBIOLOGY: Recent Results (from the past 240 hour(s))  Culture, Urine     Status: Abnormal   Collection Time: 09/21/17  2:04 PM  Result Value Ref Range Status   Specimen Description URINE, RANDOM  Final   Special Requests NONE  Final   Culture MULTIPLE SPECIES PRESENT, SUGGEST RECOLLECTION (A)  Final   Report Status 09/22/2017 FINAL  Final    RADIOLOGY STUDIES/RESULTS: Dg Chest 2 View  Result Date: 09/21/2017 CLINICAL DATA:  84 year old female with history of abdominal pain radiating into the shoulders. EXAM: CHEST  2 VIEW COMPARISON:  Chest x-ray 09/11/2017. FINDINGS: Lung volumes are low. Diffuse peribronchial cuffing. Patchy areas of interstitial prominence, most evident throughout the right mid to lower lung. No confluent consolidative airspace disease. Small right pleural effusion. Crowding of the pulmonary vasculature without frank pulmonary edema. Heart size appears mildly enlarged. Upper mediastinal contours are distorted by patient positioning. Aortic atherosclerosis. Surgical clips project  over the left axillary region, presumably from prior lymph node dissection. IMPRESSION: 1. The appearance of the chest suggests bronchitis, potentially with developing right lower lobe bronchopneumonia. 2. Small right pleural effusion. 3. Mild cardiomegaly. 4. Aortic atherosclerosis. Electronically Signed   By: Vinnie Langton M.D.   On: 09/21/2017 13:22   Dg Chest 2 View  Result Date: 09/11/2017 CLINICAL DATA:  Cough. EXAM: CHEST  2 VIEW COMPARISON:  08/19/2017 FINDINGS: There is marked cardiac enlargement. No pleural effusion or edema identified. No airspace opacities identified. Mild chronic interstitial coarsening identified. IMPRESSION: Cardiac enlargement.  No acute findings. Electronically Signed   By: Kerby Moors M.D.   On: 09/11/2017 21:38   US Renal  Result Date: 09/26/2017 CLINICAL DATA:  Renal failure. EXAM: RENAL / URINARY TRACT ULTRASOUND COMPLETE COMPARISON:  CT abdomen pelvis dated May 20, 2017. FINDINGS: Right Kidney: Length: 11.5 cm. Echogenicity within normal limits. No mass or hydronephrosis visualized. An 8 mm simple cyst is seen in the midpole. There is a 9 mm lower pole cystic lesion demonstrating low level internal echogenicity, consistent with a complicated cyst as seen on prior CT. Left Kidney: Length: 12.1 cm. Echogenicity within normal limits. No mass or hydronephrosis visualized. There is a 2.5 cm exophytic simple cyst arising from the upper pole. Bladder: Appears normal for degree of bladder distention. Bilateral pleural effusions and small ascites are noted. IMPRESSION: 1. No acute renal abnormality. 2. Bilateral pleural effusions and small ascites. Electronically Signed   By: Titus Dubin M.D.   On: 09/26/2017 12:12     LOS: 7 days   Oren Binet, MD  [facility] Pager:[phone]  If 7PM-7AM, please contact night-coverage [email or web address] Password [email or web address] 09/28/2017, 3:32 PM

## 2017-09-28 NOTE — Progress Notes (Signed)
Patient still refusing meds, refusing care.  . Does not want to get up in the chair, stated" just leave me alone". This RN offered food, tried to offer a bath  but pt just said "I don't want to eat, leave". Will monitor.

## 2017-09-28 NOTE — Progress Notes (Signed)
Progress Note  Patient Name: Valerie Downs Date of Encounter: 09/28/2017  Primary Cardiologist: Angelena Form  Subjective   Resting comfortably in bed with eyes closed.   Inpatient Medications    Scheduled Meds: . feeding supplement  1 Container Oral BID BM  . feeding supplement (ENSURE ENLIVE)  237 mL Oral BID  . metoprolol tartrate  25 mg Oral BID  . sodium chloride flush  3 mL Intravenous Q12H   Continuous Infusions: . sodium chloride    . furosemide 120 mg (09/28/17 0623)   PRN Meds: sodium chloride, acetaminophen **OR** acetaminophen, ondansetron **OR** ondansetron (ZOFRAN) IV, sodium chloride flush   Vital Signs    Vitals:   09/27/17 0947 09/27/17 1323 09/27/17 1930 09/28/17 0600  BP: (!) 145/62 (!) 152/61 (!) 149/58 (!) 145/54  Pulse: 83 72 82 75  Resp:   18 18  Temp:   97.7 F (36.5 C)   TempSrc:   Oral   SpO2:  100% 99% 95%  Weight:    150 lb (68 kg)  Height:        Intake/Output Summary (Last 24 hours) at 09/28/17 1059 Last data filed at 09/28/17 1045  Gross per 24 hour  Intake              364 ml  Output              900 ml  Net             -536 ml   Filed Weights   09/26/17 0421 09/27/17 0612 09/28/17 0600  Weight: 147 lb 1.6 oz (66.7 kg) 148 lb 11.2 oz (67.4 kg) 150 lb (68 kg)    Telemetry    SR - Personally Reviewed  ECG    N/a - Personally Reviewed  Physical Exam   General: Thin ill appearing older AA female appearing in no acute distress. Head: Normocephalic, atraumatic.  Neck: Supple, + JVD. Lungs:  Resp regular and unlabored, Crackles bilaterally. Heart: RRR, S1, S2, no S3, S4, + systolic murmur; no rub. Abdomen: Soft, non-tender, non-distended with normoactive bowel sounds. No hepatomegaly. No rebound/guarding. No obvious abdominal masses. Extremities: No clubbing, cyanosis, 1+ pretibial edema bilaterally. Distal pedal pulses are 2+ bilaterally. Neuro: Alert and oriented X 3.   Labs    Chemistry Recent Labs Lab  09/25/17 0958 09/27/17 1424 09/28/17 0335  NA 132* 134* 134*  K 5.2* 4.8 5.2*  CL 100* 102 102  CO2 20* 19* 19*  GLUCOSE 122* 84 53*  BUN 69* 70* 71*  CREATININE 3.09* 3.28* 3.17*  CALCIUM 8.6* 8.8* 8.8*  ALBUMIN  --   --  1.8*  GFRNONAA 13* 12* 12*  GFRAA 15* 14* 14*  ANIONGAP 12 13 13      Hematology Recent Labs Lab 09/24/17 1115 09/27/17 1216 09/28/17 0335  WBC 23.2* 27.3* 24.7*  RBC 3.77* 4.28 4.19  HGB 9.6* 11.1* 10.6*  HCT 32.4* 36.8 36.2  MCV 85.9 86.0 86.4  MCH 25.5* 25.9* 25.3*  MCHC 29.6* 30.2 29.3*  RDW 19.6* 20.2* 20.3*  PLT 254 238 230    Cardiac EnzymesNo results for input(s): TROPONINI in the last 168 hours.  Recent Labs Lab 09/21/17 1143  TROPIPOC 0.10*     BNP Recent Labs Lab 09/21/17 1223 09/24/17 1109  BNP >4,500.0* >4,500.0*     DDimer No results for input(s): DDIMER in the last 168 hours.    Radiology    US Renal  Result Date: 09/26/2017 CLINICAL DATA:  Renal failure.  EXAM: RENAL / URINARY TRACT ULTRASOUND COMPLETE COMPARISON:  CT abdomen pelvis dated May 20, 2017. FINDINGS: Right Kidney: Length: 11.5 cm. Echogenicity within normal limits. No mass or hydronephrosis visualized. An 8 mm simple cyst is seen in the midpole. There is a 9 mm lower pole cystic lesion demonstrating low level internal echogenicity, consistent with a complicated cyst as seen on prior CT. Left Kidney: Length: 12.1 cm. Echogenicity within normal limits. No mass or hydronephrosis visualized. There is a 2.5 cm exophytic simple cyst arising from the upper pole. Bladder: Appears normal for degree of bladder distention. Bilateral pleural effusions and small ascites are noted. IMPRESSION: 1. No acute renal abnormality. 2. Bilateral pleural effusions and small ascites. Electronically Signed   By: Titus Dubin M.D.   On: 09/26/2017 12:12    Cardiac Studies   TTE: 08/21/17  Study Conclusions  - Left ventricle: The cavity size was normal. There was severe   focal  basal and moderate concentric hypertrophy. Systolic   function was normal. The estimated ejection fraction was in the   range of 55% to 60%. Wall motion was normal; there were no   regional wall motion abnormalities. Features are consistent with   a pseudonormal left ventricular filling pattern, with concomitant   abnormal relaxation and increased filling pressure (grade 2   diastolic dysfunction). Doppler parameters are consistent with   high ventricular filling pressure. - Aortic valve: Trileaflet; moderately thickened, mildly calcified   leaflets. There was moderate regurgitation. - Aorta: Ascending aorta diameter: 41 mm (ED). - Ascending aorta: The ascending aorta was mildly dilated. - Mitral valve: Calcified annulus. There was moderate regurgitation   directed eccentrically and toward the septum. - Left atrium: The atrium was severely dilated. - Right ventricle: The cavity size was mildly dilated. Wall   thickness was normal. Systolic function was mildly reduced. - Right atrium: The atrium was severely dilated. - Pulmonic valve: There was moderate regurgitation. - Pulmonary arteries: PA peak pressure: 60 mm Hg (S). - Pericardium, extracardiac: A small, free-flowing pericardial   effusion was identified circumferential to the heart. The fluid   had no internal echoes.  Impressions:  - The right ventricular systolic pressure was increased consistent   with moderate pulmonary hypertension.  Patient Profile     83 y.o. female hx of hypertrophic CM w/out obstruction, EF 50-55% , grade 2 DD, mod AI/MR, moderate PAH, ovarian mass>>refused dx/tx, lung mass>>benign on bx but enlarging, chronic blood loss anemia, HTN, CKD III, breast CA>>mastectomy 1984, 4.7 cm AscAo aneurysm on CT 12/2016 presented with generalized weakness.   Assessment & Plan    1. Acute on Chronic diastolic HF: BNP >5176, remained net + with significant weight gain documented. Nephrology consulted yesterday with  resumption of lasix. Only 600cc UOP documented, and weight trending upwards. Cr mildly improved today. Continue diuresis per nephrology   2. Elevated Trop: suspect demand ischemia in the setting of acute HF  3. Anemia: Hgb stable at 10.6 today.   4. AKI on CKD: Nephrology now following. Cr mildly improved today, though BUN continuing to rise.    Signed, Reino Bellis, NP  09/28/2017, 10:59 AM  Pager # [phone]   I have examined the patient and reviewed assessment and plan and discussed with patient.  Agree with above as stated.  Still volume overloaded.  Albumin 1.8, which likely contributes.  COntinue diuresis as renal function allows.  Larae Grooms   For questions or updates, please contact [facility] Please consult [email or web address] for contact  info under Cardiology/STEMI. Daytime calls, contact the Day Call APP (6a-8a) or assigned team (Teams A-D) provider (7:30a - 5p). All other daytime calls (7:30-5p), contact the Card Master @ [phone].   Nighttime calls, contact the assigned APP (5p-8p) or MD (6:30p-8p). Overnight calls (8p-6a), contact the on call Fellow @ [phone].

## 2017-09-28 NOTE — Progress Notes (Signed)
Nutrition Follow-up  DOCUMENTATION CODES:   Severe malnutrition in context of chronic illness  INTERVENTION:   Ensure Enlive po BID, each supplement provides 350 kcal and 20 grams of protein  Boost Breeze po BID, each supplement provides 250 kcal and 9 grams of protein  NUTRITION DIAGNOSIS:   Malnutrition (severe) related to chronic illness (CHF, CKD) as evidenced by severe depletion of muscle mass, severe depletion of body fat.  Ongoing  GOAL:   Patient will meet greater than or equal to 90% of their needs  Not Meeting  MONITOR:   PO intake, Supplement acceptance, Weight trends, I & O's  REASON FOR ASSESSMENT:   Consult Assessment of nutrition requirement/status  ASSESSMENT:   Pt with PMH of CHF, CKD stage III, HTN, aortic insufficiency, hx of breast cancer (s/p mastectomy 1984), anemia, mitral regurgitation presents with symptomatic anemia   Pt refusing to talk upon follow up. Spoke with RN, who reports pt is refusing anything by mouth at this time. Records indicate pt consumed 40% average for four meals 9/30-10/1, then began refusing. Pt not accepting supplements at this time.   Wt noted be increased 10 lb since last RD visit. Suspect fluid accumulation.   Will continue to follow. If pt continues to refuse meals may need to consider different route of nutrition.   Medications reviewed and include: lasix Labs reviewed: Na 124 (L) K 5.2 (H) CBG 53-139 BUN 71 (H) Creatinine 3.17 (H) Phosphorus 7.1 (H)   Diet Order:  Diet renal with fluid restriction Fluid restriction: 1200 mL Fluid; Room service appropriate? Yes; Fluid consistency: Thin  Skin:  Reviewed, no issues  Last BM:  09/25/17  Height:   Ht Readings from Last 1 Encounters:  09/21/17 5\' 1"  (1.549 m)    Weight:   Wt Readings from Last 1 Encounters:  09/28/17 150 lb (68 kg)    Ideal Body Weight:  47.7 kg  BMI:  Body mass index is 28.34 kg/m.  Estimated Nutritional Needs:   Kcal:   1400-1600  Protein:  70-85 grams  Fluid:  >/= 1.4 L/d  EDUCATION NEEDS:   Education needs no appropriate at this time  [place], LDN Clinical Nutrition Pager # - [phone]

## 2017-09-28 NOTE — Progress Notes (Signed)
PT Cancellation Note  Patient Details Name: Valerie Downs MRN: [id number] DOB: May 17, 1930   Cancelled Treatment:    Reason Eval/Treat Not Completed: Fatigue/lethargy limiting ability to participate.  Patient declined PT today.  Lethargic. States "not today".  Will return at later date.   [name] 09/28/2017, 11:50 AM [name]. [name], [place] Pager [phone]

## 2017-09-29 ENCOUNTER — Inpatient Hospital Stay (HOSPITAL_COMMUNITY): Payer: Medicare Other

## 2017-09-29 DIAGNOSIS — D72823 Leukemoid reaction: Secondary | ICD-10-CM

## 2017-09-29 LAB — CBC
HCT: 29.1 % — ABNORMAL LOW (ref 36.0–46.0)
HEMATOCRIT: 38.4 % (ref 36.0–46.0)
HEMOGLOBIN: 11.3 g/dL — AB (ref 12.0–15.0)
Hemoglobin: 8.6 g/dL — ABNORMAL LOW (ref 12.0–15.0)
MCH: 25.4 pg — AB (ref 26.0–34.0)
MCH: 25.3 pg — ABNORMAL LOW (ref 26.0–34.0)
MCHC: 29.6 g/dL — ABNORMAL LOW (ref 30.0–36.0)
MCHC: 29.4 g/dL — AB (ref 30.0–36.0)
MCV: 86.1 fL (ref 78.0–100.0)
MCV: 86.1 fL (ref 78.0–100.0)
Platelets: 197 10*3/uL (ref 150–400)
RBC: 3.38 MIL/uL — AB (ref 3.87–5.11)
RBC: 4.46 MIL/uL (ref 3.87–5.11)
RDW: 20.3 % — AB (ref 11.5–15.5)
RDW: 20.1 % — AB (ref 11.5–15.5)
WBC: 22.1 10*3/uL — AB (ref 4.0–10.5)
WBC: 31.3 10*3/uL — AB (ref 4.0–10.5)

## 2017-09-29 LAB — RENAL FUNCTION PANEL
ALBUMIN: 2 g/dL — AB (ref 3.5–5.0)
Anion gap: 14 (ref 5–15)
BUN: 79 mg/dL — AB (ref 6–20)
CALCIUM: 9.1 mg/dL (ref 8.9–10.3)
CO2: 18 mmol/L — ABNORMAL LOW (ref 22–32)
CREATININE: 3.43 mg/dL — AB (ref 0.44–1.00)
Chloride: 102 mmol/L (ref 101–111)
GFR, EST AFRICAN AMERICAN: 13 mL/min — AB (ref >60)
GFR, EST NON AFRICAN AMERICAN: 11 mL/min — AB (ref >60)
Glucose, Bld: 60 mg/dL — ABNORMAL LOW (ref 65–99)
Phosphorus: 8 mg/dL — ABNORMAL HIGH (ref 2.5–4.6)
Potassium: 5 mmol/L (ref 3.5–5.1)
SODIUM: 134 mmol/L — AB (ref 135–145)

## 2017-09-29 MED ORDER — METOPROLOL TARTRATE 50 MG PO TABS
50.0000 mg | ORAL_TABLET | Freq: Two times a day (BID) | ORAL | Status: DC
  Administered 2017-09-29: 50 mg via ORAL
  Filled 2017-09-29 (×3): qty 1

## 2017-09-29 MED ORDER — METOLAZONE 5 MG PO TABS
5.0000 mg | ORAL_TABLET | Freq: Every day | ORAL | Status: DC
  Administered 2017-09-29 – 2017-10-02 (×4): 5 mg via ORAL
  Filled 2017-09-29 (×4): qty 1

## 2017-09-29 MED ORDER — ASPIRIN 81 MG PO CHEW
81.0000 mg | CHEWABLE_TABLET | Freq: Every day | ORAL | Status: DC
  Administered 2017-09-29 – 2017-10-02 (×4): 81 mg via ORAL
  Filled 2017-09-29 (×4): qty 1

## 2017-09-29 MED ORDER — PANTOPRAZOLE SODIUM 40 MG PO TBEC
40.0000 mg | DELAYED_RELEASE_TABLET | Freq: Every day | ORAL | Status: DC
  Administered 2017-09-29 – 2017-09-30 (×2): 40 mg via ORAL
  Filled 2017-09-29 (×2): qty 1

## 2017-09-29 MED ORDER — IOPAMIDOL (ISOVUE-300) INJECTION 61%
INTRAVENOUS | Status: AC
  Filled 2017-09-29: qty 30

## 2017-09-29 NOTE — Progress Notes (Signed)
Notified MD of HR sustaining in 120-130s. EKG performed.

## 2017-09-29 NOTE — Progress Notes (Signed)
Physical Therapy Treatment Patient Details Name: Valerie Downs MRN: [id number] DOB: 07/07/1930 Today's Date: 09/29/2017    History of Present Illness  83 y.o. female with medical history aortic insufficiency, mitral regurgitation, chronic blood loss anemia, chronic diastolic CHF, hypertension, chronic kidney disease, tobacco abuse  significant of breast cancer (s/p mastectomy 1984) , ovarian mass and enlarging lung mass /LAN  admitted wtih LE edema and dyspnea. Dx of acute on chronic CHF.    PT Comments    Patient continues to be lethargic however agreeable to OOB transfer. Pt required mod/max A +2 for bed mobility and functional transfers. Given pt's overall mobility level recommending SNF for further skilled PT services to maximize independence and safety with mobility.    Follow Up Recommendations  Supervision/Assistance - 24 hour;SNF     Equipment Recommendations  None recommended by PT    Recommendations for Other Services       Precautions / Restrictions Precautions Precautions: Fall Restrictions Weight Bearing Restrictions: No    Mobility  Bed Mobility Overal bed mobility: Needs Assistance Bed Mobility: Supine to Sit     Supine to sit: Mod assist     General bed mobility comments: assist to bring bilat LE to EOB and elevate trunk into sitting; pt assisted more with bed mobility this session vs previous session; cues for sequencing  Transfers Overall transfer level: Needs assistance Equipment used: 2 person hand held assist (face to face with use of gait belt and chuck pad) Transfers: Sit to/from Stand;Stand Pivot Transfers Sit to Stand: +2 safety/equipment;From elevated surface;Max assist Stand pivot transfers: Max assist;+2 physical assistance       General transfer comment: cues for technique; knees blocked and max A +2 to power up into standing and to maintain balance while pivoting to recliner; pt was able to assist minimally by pivoting  feet  Ambulation/Gait                 Stairs            Wheelchair Mobility    Modified Rankin (Stroke Patients Only)       Balance Overall balance assessment: Needs assistance Sitting-balance support: Feet supported Sitting balance-Leahy Scale: Good     Standing balance support: Bilateral upper extremity supported;During functional activity Standing balance-Leahy Scale: Poor                              Cognition Arousal/Alertness: Lethargic Behavior During Therapy: WFL for tasks assessed/performed Overall Cognitive Status: Impaired/Different from baseline Area of Impairment: Following commands;Problem solving;Safety/judgement                       Following Commands: Follows one step commands with increased time;Follows one step commands inconsistently Safety/Judgement: Decreased awareness of safety;Decreased awareness of deficits   Problem Solving: Slow processing;Decreased initiation;Requires verbal cues;Requires tactile cues        Exercises      General Comments General comments (skin integrity, edema, etc.): BP 147/85 (103), SpO2 >90% on 2L O2 via [place], and HR WNL in sitting      Pertinent Vitals/Pain Pain Assessment: No/denies pain    Home Living                      Prior Function            PT Goals (current goals can now be found in the care plan section) Acute Rehab  PT Goals PT Goal Formulation: With patient Time For Goal Achievement: 10/06/17 Potential to Achieve Goals: Good Progress towards PT goals: Not progressing toward goals - comment (limited by lethargy)    Frequency    Min 3X/week      PT Plan Discharge plan needs to be updated    Co-evaluation              AM-PAC PT "6 Clicks" Daily Activity  Outcome Measure  Difficulty turning over in bed (including adjusting bedclothes, sheets and blankets)?: A Lot Difficulty moving from lying on back to sitting on the side of the bed? :  Unable Difficulty sitting down on and standing up from a chair with arms (e.g., wheelchair, bedside commode, etc,.)?: Unable Help needed moving to and from a bed to chair (including a wheelchair)?: A Lot Help needed walking in hospital room?: Total Help needed climbing 3-5 steps with a railing? : Total 6 Click Score: 8    End of Session Equipment Utilized During Treatment: Gait belt Activity Tolerance: Patient limited by lethargy Patient left: with call bell/phone within reach;in chair;with chair alarm set Nurse Communication: Mobility status;Need for lift equipment PT Visit Diagnosis: Unsteadiness on feet (R26.81);Muscle weakness (generalized) (M62.81)     Time: 2505-3976 PT Time Calculation (min) (ACUTE ONLY): 41 min  Charges:  $Therapeutic Activity: 38-52 mins                    G Codes:       Earney Navy, PTA Pager: [phone]     Darliss Cheney 09/29/2017, 4:04 PM

## 2017-09-29 NOTE — Progress Notes (Addendum)
PROGRESS NOTE        PATIENT DETAILS Name: Valerie Downs Age: 83 y.o. Sex: female Date of Birth: 1930-07-14 Admit Date: 09/21/2017 Admitting Physician Waldemar Dickens, MD POE:UMPNTI, Ples Specter, FNP  Brief Narrative: Patient is a 83 y.o. female with history of chronic diastolic heart failure, hypertrophic cardiomyopathy, pulmonary arterial hypertension, admitted with fatigue-thought to be secondary to acute and chronic diastolic heart failure, and worsening renal function. See below for further details  Subjective: Sitting up in bed-breakfast at bedside-doesn't feel like eating.Feels much better than the pas  Assessment/Plan: Acute kidney injury on stage III chronic kidney disease: secondary to cardiorenal syndrome, nephrology following and directing diuretic regimen. Per nephrology, patient not a good long-term candidate for hemodialysis.  Mild hyperkalemia: result-continue to follow electrolytes periodically.  Leukocytosis: leukocytosis continues to worsen-however she is afebrile-there is no history of diarrhea (spoke to RN). Repeat blood cultures, repeat CXR today-if spikes fever or a infection source becomes apparent will start Abx.   Addendum: CXR shows +CHF with pl effusion-given worsening leukocytosis-will check CT Chest/Abd-if does show large pl effusion-and if leukocytosis persists may need thora at some point.   Acute on chronic diastolic heart failure: improving-still volume overloaded-continue with Lasix and cardiology and nephrology continued to follow.  PAF: brief episode of atrial fibrillation last night and this morning, increase metoprolol to 50 mg twice a day. Spoke with cardiology-we both agree that she is a poor long-term anticoagulation candidate-and hence will not anticoagulate, will start low-dose aspirin and watch hemoglobin.  Minimally elevated troponins: Trend is flat and not consistent with ACS-cardiology following.  Pulmonary  hypertension: Likely secondary to left-sided heart failure-further workup in the outpatient setting  UTI: Urine culture with multiple bacterial morphotypes-completed antimicrobial therapy. Remains afebrile and nontoxic appearing-although has significant leukocytosis  Anemia: Hemoglobin currently stable-apparently has a history of chronic blood loss anemia. Plans are to follow at this time.  Deconditioning: Secondary to frailty-worsened by acute illness-PT recommended home health services  Left adnexal mass: Per prior notes-patient has refused workup in the past.  Morning labs/Imaging ordered: yes  DVT Prophylaxis: SCD's  Code Status: Full code   Family Communication: None at bedside  Disposition Plan: Remain inpatient  Antimicrobial agents: Anti-infectives    Start     Dose/Rate Route Frequency Ordered Stop   09/21/17 1900  cefTRIAXone (ROCEPHIN) 1 g in dextrose 5 % 50 mL IVPB  Status:  Discontinued     1 g 100 mL/hr over 30 Minutes Intravenous Every 24 hours 09/21/17 1817 09/26/17 1304      Procedures: None  CONSULTS:  cardiology and nephrology  Time spent: 25 minutes-Greater than 50% of this time was spent in counseling, explanation of diagnosis, planning of further management, and coordination of care.  MEDICATIONS: Scheduled Meds: . feeding supplement  1 Container Oral BID BM  . feeding supplement (ENSURE ENLIVE)  237 mL Oral BID  . metolazone  5 mg Oral Daily  . metoprolol tartrate  25 mg Oral BID  . sodium chloride flush  3 mL Intravenous Q12H   Continuous Infusions: . sodium chloride    . ferumoxytol Stopped (09/28/17 1515)  . furosemide Stopped (09/29/17 0742)   PRN Meds:.sodium chloride, acetaminophen **OR** acetaminophen, ondansetron **OR** ondansetron (ZOFRAN) IV, sodium chloride flush   PHYSICAL EXAM: Vital signs: Vitals:   09/28/17 1156 09/28/17 1927 09/29/17 0611 09/29/17 1100  BP: (!) 135/56 106/60 131/66 126/61  Pulse: 86 (!) 126 87     Resp: 20 18 (!) 185   Temp: (!) 97.5 F (36.4 C) 97.7 F (36.5 C) (!) 97.3 F (36.3 C)   TempSrc: Axillary Oral Oral   SpO2: 99% 99% 96%   Weight:   67.9 kg (149 lb 11.2 oz)   Height:       Filed Weights   09/27/17 0612 09/28/17 0600 09/29/17 0611  Weight: 67.4 kg (148 lb 11.2 oz) 68 kg (150 lb) 67.9 kg (149 lb 11.2 oz)   Body mass index is 28.29 kg/m.   General appearance :Awake, alert,chronically sick appearing Eyes:, pupils equally reactive to light and accomodation,no scleral icterus. HEENT: Atraumatic and Normocephalic Neck: supple, no JVD. Resp:Good air entry bilaterally-no rales or rhonchi CVS: S1 S2 regular  GI: Bowel sounds present, Non tender and not distended with no gaurding, rigidity or rebound. Extremities: B/L Lower Ext shows no edema, both legs are warm to touch Neurology:  speech clear,Non focal-but with gen weakness Musculoskeletal:No digital cyanosis Skin:No Rash, warm and dry Wounds:N/A  I have personally reviewed following labs and imaging studies  LABORATORY DATA: CBC:  Recent Labs Lab 09/24/17 1115 09/27/17 1216 09/28/17 0335 09/29/17 0951 09/29/17 1118  WBC 23.2* 27.3* 24.7* 22.1* 31.3*  NEUTROABS 21.0*  --   --   --   --   HGB 9.6* 11.1* 10.6* 8.6* 11.3*  HCT 32.4* 36.8 36.2 29.1* 38.4  MCV 85.9 86.0 86.4 86.1 86.1  PLT 254 238 230 SPECIMEN CLOTTED 101    Basic Metabolic Panel:  Recent Labs Lab 09/24/17 1115 09/25/17 0958 09/27/17 1424 09/28/17 0335 09/29/17 0951  NA 133* 132* 134* 134* 134*  K 5.8* 5.2* 4.8 5.2* 5.0  CL 104 100* 102 102 102  CO2 18* 20* 19* 19* 18*  GLUCOSE 106* 122* 84 53* 60*  BUN 66* 69* 70* 71* 79*  CREATININE 2.79* 3.09* 3.28* 3.17* 3.43*  CALCIUM 8.9 8.6* 8.8* 8.8* 9.1  PHOS  --   --   --  7.1* 8.0*    GFR: Estimated Creatinine Clearance: 10.6 mL/min (A) (by C-G formula based on SCr of 3.43 mg/dL (H)).  Liver Function Tests:  Recent Labs Lab 09/28/17 0335 09/29/17 0951  ALBUMIN 1.8* 2.0*    No results for input(s): LIPASE, AMYLASE in the last 168 hours. No results for input(s): AMMONIA in the last 168 hours.  Coagulation Profile: No results for input(s): INR, PROTIME in the last 168 hours.  Cardiac Enzymes: No results for input(s): CKTOTAL, CKMB, CKMBINDEX, TROPONINI in the last 168 hours.  BNP (last 3 results)  Recent Labs  04/01/17 1142 04/28/17 1054  PROBNP 31,525* 3,325.0*    HbA1C: No results for input(s): HGBA1C in the last 72 hours.  CBG: No results for input(s): GLUCAP in the last 168 hours.  Lipid Profile: No results for input(s): CHOL, HDL, LDLCALC, TRIG, CHOLHDL, LDLDIRECT in the last 72 hours.  Thyroid Function Tests: No results for input(s): TSH, T4TOTAL, FREET4, T3FREE, THYROIDAB in the last 72 hours.  Anemia Panel:  Recent Labs  09/28/17 0335  TIBC 267  IRON 9*    Urine analysis:    Component Value Date/Time   COLORURINE AMBER (A) 09/21/2017 1404   APPEARANCEUR CLOUDY (A) 09/21/2017 1404   LABSPEC 1.015 09/21/2017 1404   PHURINE 5.0 09/21/2017 1404   GLUCOSEU NEGATIVE 09/21/2017 1404   HGBUR SMALL (A) 09/21/2017 1404   BILIRUBINUR NEGATIVE 09/21/2017 1404   KETONESUR  NEGATIVE 09/21/2017 1404   PROTEINUR 100 (A) 09/21/2017 1404   UROBILINOGEN 0.2 08/16/2014 0535   NITRITE NEGATIVE 09/21/2017 1404   LEUKOCYTESUR LARGE (A) 09/21/2017 1404    Sepsis Labs: Lactic Acid, Venous    Component Value Date/Time   LATICACIDVEN 1.28 01/01/2017 1603    MICROBIOLOGY: Recent Results (from the past 240 hour(s))  Culture, Urine     Status: Abnormal   Collection Time: 09/21/17  2:04 PM  Result Value Ref Range Status   Specimen Description URINE, RANDOM  Final   Special Requests NONE  Final   Culture MULTIPLE SPECIES PRESENT, SUGGEST RECOLLECTION (A)  Final   Report Status 09/22/2017 FINAL  Final    RADIOLOGY STUDIES/RESULTS: Dg Chest 2 View  Result Date: 09/21/2017 CLINICAL DATA:  83 year old female with history of abdominal  pain radiating into the shoulders. EXAM: CHEST  2 VIEW COMPARISON:  Chest x-ray 09/11/2017. FINDINGS: Lung volumes are low. Diffuse peribronchial cuffing. Patchy areas of interstitial prominence, most evident throughout the right mid to lower lung. No confluent consolidative airspace disease. Small right pleural effusion. Crowding of the pulmonary vasculature without frank pulmonary edema. Heart size appears mildly enlarged. Upper mediastinal contours are distorted by patient positioning. Aortic atherosclerosis. Surgical clips project over the left axillary region, presumably from prior lymph node dissection. IMPRESSION: 1. The appearance of the chest suggests bronchitis, potentially with developing right lower lobe bronchopneumonia. 2. Small right pleural effusion. 3. Mild cardiomegaly. 4. Aortic atherosclerosis. Electronically Signed   By: Vinnie Langton M.D.   On: 09/21/2017 13:22   Dg Chest 2 View  Result Date: 09/11/2017 CLINICAL DATA:  Cough. EXAM: CHEST  2 VIEW COMPARISON:  08/19/2017 FINDINGS: There is marked cardiac enlargement. No pleural effusion or edema identified. No airspace opacities identified. Mild chronic interstitial coarsening identified. IMPRESSION: Cardiac enlargement.  No acute findings. Electronically Signed   By: Kerby Moors M.D.   On: 09/11/2017 21:38   US Renal  Result Date: 09/26/2017 CLINICAL DATA:  Renal failure. EXAM: RENAL / URINARY TRACT ULTRASOUND COMPLETE COMPARISON:  CT abdomen pelvis dated May 20, 2017. FINDINGS: Right Kidney: Length: 11.5 cm. Echogenicity within normal limits. No mass or hydronephrosis visualized. An 8 mm simple cyst is seen in the midpole. There is a 9 mm lower pole cystic lesion demonstrating low level internal echogenicity, consistent with a complicated cyst as seen on prior CT. Left Kidney: Length: 12.1 cm. Echogenicity within normal limits. No mass or hydronephrosis visualized. There is a 2.5 cm exophytic simple cyst arising from the upper  pole. Bladder: Appears normal for degree of bladder distention. Bilateral pleural effusions and small ascites are noted. IMPRESSION: 1. No acute renal abnormality. 2. Bilateral pleural effusions and small ascites. Electronically Signed   By: Titus Dubin M.D.   On: 09/26/2017 12:12     LOS: 8 days   Oren Binet, MD  [facility] Pager:[phone]  If 7PM-7AM, please contact night-coverage [email or web address] Password [email or web address] 09/29/2017, 12:55 PM

## 2017-09-29 NOTE — Progress Notes (Signed)
[facility] Rounding Note  Subjective/Interval History:  Remains fatigued and generally annoyed Has been refusing care and wants to be generally left alone Dose endorse "feeling a little better" (Briefly broached dialysis subject with her and she vehemently indicated would never want)  Objective Vital signs in last 24 hours: Vitals:   09/28/17 0600 09/28/17 1156 09/28/17 1927 09/29/17 0611  BP: (!) 145/54 (!) 135/56 106/60 131/66  Pulse: 75 86 (!) 126 87  Resp: 18 20 18  (!) 185  Temp:  (!) 97.5 F (36.4 C) 97.7 F (36.5 C) (!) 97.3 F (36.3 C)  TempSrc:  Axillary Oral Oral  SpO2: 95% 99% 99% 96%  Weight: 68 kg (150 lb)   67.9 kg (149 lb 11.2 oz)  Height:       Weight change: -0.136 kg (-4.8 oz)  Intake/Output Summary (Last 24 hours) at 09/29/17 1015 Last data filed at 09/29/17 1012  Gross per 24 hour  Intake              435 ml  Output              900 ml  Net             -465 ml   Physical Exam:  Blood pressure 131/66, pulse 87, temperature (!) 97.3 F (36.3 C), temperature source Oral, resp. rate (!) 185, height 5\' 1"  (1.549 m), weight 67.9 kg (149 lb 11.2 oz), SpO2 96 %.  Seen lying in bed VS as noted No change in exam overnight Generalized anasarca with pitting edema to the hips, and of abdominal wall, sacral and axillary regions Anteriorly fairly clear [id number]  9-6/0 systolic murmur LSB 1/6 diastolic murmur ULSB Abd obese, pitting edema abdominal wall 2+ edema knees to hips 1+ below knees Peau d'orange look to thighs  Labs:  Recent Labs Lab 09/22/17 1204 09/24/17 1115 09/25/17 0958 09/27/17 1424 09/28/17 0335  NA 134* 133* 132* 134* 134*  K 5.1 5.8* 5.2* 4.8 5.2*  CL 101 104 100* 102 102  CO2 21* 18* 20* 19* 19*  GLUCOSE 139* 106* 122* 84 53*  BUN 46* 66* 69* 70* 71*  CREATININE 1.81* 2.79* 3.09* 3.28* 3.17*  CALCIUM 8.8* 8.9 8.6* 8.8* 8.8*  PHOS 5.2*  --   --   --  7.1*    Recent Labs Lab 09/28/17 0335  ALBUMIN 1.8*    Recent Labs Lab  09/22/17 1204 09/24/17 1115 09/27/17 1216 09/28/17 0335  WBC 15.9* 23.2* 27.3* 24.7*  NEUTROABS  --  21.0*  --   --   HGB 9.9* 9.6* 11.1* 10.6*  HCT 33.9* 32.4* 36.8 36.2  MCV 84.1 85.9 86.0 86.4  PLT 254 254 238 230    Iron/TIBC/Ferritin/ %Sat    Component Value Date/Time   IRON 9 (L) 09/28/2017 0335   TIBC 267 09/28/2017 0335   FERRITIN 41 07/06/2016 1956   IRONPCTSAT 3 (L) 09/28/2017 0335    Medications: . sodium chloride    . ferumoxytol Stopped (09/28/17 1515)  . furosemide Stopped (09/29/17 0742)   . feeding supplement  1 Container Oral BID BM  . feeding supplement (ENSURE ENLIVE)  237 mL Oral BID  . metoprolol tartrate  25 mg Oral BID  . sodium chloride flush  3 mL Intravenous Q12H    Background: 83 y.o. 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 H/o recurrent episodes of decompensated dCHF. Also recurring blood loss anemia, enlarging lung mass and possible ovarian CA. Admitted 09/21/17  with feeling of unwell (can't really tell my why she came to the hospital but notes indicated SOB/swelling). Creatinine on admission around 1.5, fluid overloaded. Rec'd modest doses lasix, stopped d/t worsening renal function, but on review of weights pt up 10 lb since admission, 20 lb since 08/30/17 cardiology clinic visit and was  getting  IVF's. We were asked to see 09/27/17. Worsening HF probable culprit cause of her worsening renal fx (but to my eye not related to diuretics).  Assessment/Recommendations 1. AKI on CKD3 - pt with NO evidence of "overdiuresis" and currently generalized anasarca with 10 lb weight gain this hospitalization, 20 lb since last cards clinic visit on 9/4 (and was overloaded then) Worsening HF could well have been been the culprit cause of her worsening renal fx (but to my eye not related to diuretics). 1. Add metolazone 5 mg/day po 2. Continue 160 Q8H Lasix IV 3. Continue foley for UOP monitoring.   4. Given her age and significant  comorbids would be a very poor candidate for dialysis and would not offer if kidney failure  (and in fact she would not accept) 2. Anasarca - see #1 3. Hyperkalemia - changed diet to renal (for K restriction). No labs today yet 4. Acute on chronic diastolic HF - diurese as we can 5. AI/MR/PAH - per cardiology 6. Anemia -s/p 2 unit transfusion this admission. Current Hb ~ 11. EXTREMELY low tsat and fe. Dose Feraheme X2. 1st dose was 10/3 7. Possible ovarian CA - has declined workup 8. Leukocytosis - worsening. Treated for UTI on admission.  Evaluation by primary team.   Jamal Maes, MD [facility] [phone] Pager 09/29/2017, 10:15 AM

## 2017-09-29 NOTE — Progress Notes (Signed)
Per MD, ok to leave IV in pt left FA.

## 2017-09-29 NOTE — Progress Notes (Signed)
Occupational Therapy Treatment Patient Details Name: Valerie Downs MRN: [id number] DOB: [birth year] Today's Date: 09/29/2017    History of present illness  83 y.o. female with medical history aortic insufficiency, mitral regurgitation, chronic blood loss anemia, chronic diastolic CHF, hypertension, chronic kidney disease, tobacco abuse  significant of breast cancer (s/p mastectomy 1984) , ovarian mass and enlarging lung mass /LAN  admitted wtih LE edema and dyspnea. Dx of acute on chronic CHF.   OT comments  Pt very lethargic and minimally participatory.  Requires max cues to maintain adequate arousal for participation.  She requires max A +2 for sit to stand, and max - total A for all ADLs at this time.  Currently am not sure pt's family will be able to provide level of care pt is requiring as she will require 24 hour physical assist and +2 assist for transfers, therefore, recommendation changed to SNF.   Would pt/family benefit from palliative consult?   Will follow acutely.   Follow Up Recommendations  SNF;Supervision/Assistance - 24 hour    Equipment Recommendations  Tub/shower bench;3 in 1 bedside commode    Recommendations for Other Services      Precautions / Restrictions Precautions Precautions: Fall Restrictions Weight Bearing Restrictions: No       Mobility Bed Mobility Overal bed mobility: Needs Assistance Bed Mobility: Supine to Sit     Supine to sit: Mod assist     General bed mobility comments: Pt sitting up in recliner   Transfers Overall transfer level: Needs assistance Equipment used: 2 person hand held assist Transfers: Sit to/from Stand Sit to Stand: Max assist;+2 physical assistance Stand pivot transfers: Max assist;+2 physical assistance       General transfer comment: assist for all aspects     Balance Overall balance assessment: Needs assistance Sitting-balance support: Feet supported Sitting balance-Leahy Scale: Fair     Standing balance  support: Bilateral upper extremity supported Standing balance-Leahy Scale: Poor Standing balance comment: requires max A                             ADL either performed or assessed with clinical judgement   ADL Overall ADL's : Needs assistance/impaired     Grooming: Wash/dry hands;Wash/dry face;Oral care;Maximal assistance;Sitting                   Toilet Transfer: Maximal assistance;+2 for physical assistance;Stand-pivot   Toileting- Clothing Manipulation and Hygiene: Total assistance;Sit to/from stand       Functional mobility during ADLs: Maximal assistance;+2 for physical assistance General ADL Comments: Pt minimally participatory      Vision       Perception     Praxis      Cognition Arousal/Alertness: Lethargic Behavior During Therapy: Flat affect Overall Cognitive Status: Impaired/Different from baseline Area of Impairment: Attention;Following commands;Problem solving                   Current Attention Level: Sustained;Focused   Following Commands: Follows one step commands inconsistently;Follows one step commands with increased time Safety/Judgement: Decreased awareness of safety;Decreased awareness of deficits   Problem Solving: Slow processing;Decreased initiation;Difficulty sequencing;Requires verbal cues;Requires tactile cues General Comments: Pt very lethargic.  Requires max cues to maintain arousal and attention         Exercises     Shoulder Instructions       General Comments BP 147/85 (103), SpO2 >90% on 2L O2 via [place], and HR WNL  in sitting    Pertinent Vitals/ Pain       Pain Assessment: No/denies pain  Home Living                                          Prior Functioning/Environment              Frequency  Min 2X/week        Progress Toward Goals  OT Goals(current goals can now be found in the care plan section)  Progress towards OT goals: Not progressing toward goals -  comment (lethargy )  ADL Goals Pt Will Perform Grooming: with min assist;sitting Pt Will Perform Lower Body Bathing: with mod assist;sit to/from stand Pt Will Perform Lower Body Dressing: with max assist;sit to/from stand Additional ADL Goal #1: Pt will transfer for [facility] with mod A  Additional ADL Goal #2: Pt will sustain attention to simple ADL activities x 5 mins   Plan Discharge plan needs to be updated    Co-evaluation                 AM-PAC PT "6 Clicks" Daily Activity     Outcome Measure   Help from another person eating meals?: A Little Help from another person taking care of personal grooming?: A Lot Help from another person toileting, which includes using toliet, bedpan, or urinal?: Total Help from another person bathing (including washing, rinsing, drying)?: Total Help from another person to put on and taking off regular upper body clothing?: Total Help from another person to put on and taking off regular lower body clothing?: Total 6 Click Score: 9    End of Session Equipment Utilized During Treatment: Gait belt  OT Visit Diagnosis: Unsteadiness on feet (R26.81);Other symptoms and signs involving cognitive function;Other abnormalities of gait and mobility (R26.89)   Activity Tolerance Patient limited by lethargy   Patient Left in chair;with call bell/phone within reach;with chair alarm set   Nurse Communication Mobility status        Time: 9678-9381 OT Time Calculation (min): 16 min  Charges: OT General Charges $OT Visit: 1 Visit OT Treatments $Therapeutic Activity: 8-22 mins  [facility], OTR/L [phone]    Lucille Passy M 09/29/2017, 5:58 PM

## 2017-09-30 ENCOUNTER — Encounter (HOSPITAL_COMMUNITY): Payer: Self-pay | Admitting: Radiology

## 2017-09-30 ENCOUNTER — Inpatient Hospital Stay (HOSPITAL_COMMUNITY): Payer: Medicare Other

## 2017-09-30 DIAGNOSIS — J90 Pleural effusion, not elsewhere classified: Secondary | ICD-10-CM

## 2017-09-30 HISTORY — PX: IR THORACENTESIS ASP PLEURAL SPACE W/IMG GUIDE: IMG5380

## 2017-09-30 LAB — CBC WITH DIFFERENTIAL/PLATELET
Basophils Absolute: 0 10*3/uL (ref 0.0–0.1)
Basophils Relative: 0 %
EOS ABS: 0 10*3/uL (ref 0.0–0.7)
EOS PCT: 0 %
HCT: 35.3 % — ABNORMAL LOW (ref 36.0–46.0)
HEMOGLOBIN: 10.2 g/dL — AB (ref 12.0–15.0)
LYMPHS PCT: 4 %
Lymphs Abs: 1.1 10*3/uL (ref 0.7–4.0)
MCH: 24.8 pg — ABNORMAL LOW (ref 26.0–34.0)
MCHC: 28.9 g/dL — ABNORMAL LOW (ref 30.0–36.0)
MCV: 85.9 fL (ref 78.0–100.0)
MONO ABS: 1.1 10*3/uL — AB (ref 0.1–1.0)
Monocytes Relative: 4 %
NEUTROS PCT: 92 %
Neutro Abs: 24.1 10*3/uL — ABNORMAL HIGH (ref 1.7–7.7)
PLATELETS: 179 10*3/uL (ref 150–400)
RBC: 4.11 MIL/uL (ref 3.87–5.11)
RDW: 20 % — ABNORMAL HIGH (ref 11.5–15.5)
WBC: 26.3 10*3/uL — AB (ref 4.0–10.5)

## 2017-09-30 LAB — LACTATE DEHYDROGENASE, PLEURAL OR PERITONEAL FLUID: LD FL: 66 U/L — AB (ref 3–23)

## 2017-09-30 LAB — PROTEIN, PLEURAL OR PERITONEAL FLUID

## 2017-09-30 LAB — GRAM STAIN

## 2017-09-30 LAB — RENAL FUNCTION PANEL
ALBUMIN: 2.1 g/dL — AB (ref 3.5–5.0)
Anion gap: 10 (ref 5–15)
BUN: 78 mg/dL — ABNORMAL HIGH (ref 6–20)
CALCIUM: 9 mg/dL (ref 8.9–10.3)
CO2: 20 mmol/L — ABNORMAL LOW (ref 22–32)
CREATININE: 3.5 mg/dL — AB (ref 0.44–1.00)
Chloride: 104 mmol/L (ref 101–111)
GFR, EST AFRICAN AMERICAN: 13 mL/min — AB (ref >60)
GFR, EST NON AFRICAN AMERICAN: 11 mL/min — AB (ref >60)
Glucose, Bld: 71 mg/dL (ref 65–99)
Phosphorus: 8 mg/dL — ABNORMAL HIGH (ref 2.5–4.6)
Potassium: 4.7 mmol/L (ref 3.5–5.1)
SODIUM: 134 mmol/L — AB (ref 135–145)

## 2017-09-30 LAB — BODY FLUID CELL COUNT WITH DIFFERENTIAL
Lymphs, Fluid: 40 %
MONOCYTE-MACROPHAGE-SEROUS FLUID: 13 % — AB (ref 50–90)
Neutrophil Count, Fluid: 47 % — ABNORMAL HIGH (ref 0–25)
Total Nucleated Cell Count, Fluid: 650 cu mm (ref 0–1000)

## 2017-09-30 LAB — LACTATE DEHYDROGENASE: LDH: 208 U/L — ABNORMAL HIGH (ref 98–192)

## 2017-09-30 LAB — HEPATIC FUNCTION PANEL
ALK PHOS: 129 U/L — AB (ref 38–126)
ALT: 14 U/L (ref 14–54)
AST: 17 U/L (ref 15–41)
Albumin: 2.1 g/dL — ABNORMAL LOW (ref 3.5–5.0)
Bilirubin, Direct: 0.5 mg/dL (ref 0.1–0.5)
Indirect Bilirubin: 0.7 mg/dL (ref 0.3–0.9)
TOTAL PROTEIN: 6.5 g/dL (ref 6.5–8.1)
Total Bilirubin: 1.2 mg/dL (ref 0.3–1.2)

## 2017-09-30 LAB — GLUCOSE, PLEURAL OR PERITONEAL FLUID: GLUCOSE FL: 77 mg/dL

## 2017-09-30 MED ORDER — METOPROLOL TARTRATE 5 MG/5ML IV SOLN
5.0000 mg | INTRAVENOUS | Status: DC | PRN
  Administered 2017-09-30: 5 mg via INTRAVENOUS
  Filled 2017-09-30 (×2): qty 5

## 2017-09-30 MED ORDER — METOPROLOL TARTRATE 50 MG PO TABS
50.0000 mg | ORAL_TABLET | Freq: Three times a day (TID) | ORAL | Status: DC
  Administered 2017-09-30 – 2017-10-02 (×5): 50 mg via ORAL
  Filled 2017-09-30 (×5): qty 1

## 2017-09-30 MED ORDER — LIDOCAINE HCL 1 % IJ SOLN
INTRAMUSCULAR | Status: AC
  Filled 2017-09-30: qty 20

## 2017-09-30 MED ORDER — LIDOCAINE HCL (PF) 1 % IJ SOLN
INTRAMUSCULAR | Status: AC | PRN
  Administered 2017-09-30: 6 mL

## 2017-09-30 NOTE — Progress Notes (Signed)
Several family members including son and daughter at bedside. Explained severe failure to thrive syndrome/lethargy-in a setting of renal failure/CHF. Family aware that she's had a lung mass and a ovarian mass for the past year or so that she has refused workup.  During this hospital stay-she has asked me to leave her alone. I had tried to see if patient had any advanced directive this morning-but she was severely fatigued and lethargic and fell asleep, and hence I could not get any information out of her.   I spoke with the daughter and son outside of the room-we discussed CODE STATUS and overall poor prognoses-they will discuss amongst themselves and see if the rest of the family members will be agreeable for a DO NOT RESUSCITATE order. A palliative care consultation is also currently pending.

## 2017-09-30 NOTE — Sedation Documentation (Addendum)
thora in process- no sedation

## 2017-09-30 NOTE — Progress Notes (Signed)
[facility] Rounding Note  Subjective/Interval History:  Remains severely fatigued  Had R thoracentesis 975 ml clear fluid earlier today Improved UOP past 24 hours with addition of metolazone Weight down  About 2-2.5 kg if can be believed  (Briefly broached dialysis subject with her 10/4 and she vehemently indicated would never want - in fact not a candidate but I felt compelled to bring up)  Objective Vital signs in last 24 hours: Vitals:   09/30/17 0614 09/30/17 0928 09/30/17 1000 09/30/17 1005  BP: 133/76 132/80  116/63  Pulse: 79 80 60 80  Resp:      Temp: 98.2 F (36.8 C)     TempSrc: Oral     SpO2: 94% 99% 98% 100%  Weight: 66.4 kg (146 lb 4.8 oz)     Height:       Weight change: -1.542 kg (-3 lb 6.4 oz)  Intake/Output Summary (Last 24 hours) at 09/30/17 1215 Last data filed at 09/30/17 1017  Gross per 24 hour  Intake              132 ml  Output             1820 ml  Net            -1688 ml   Physical Exam:  Blood pressure 116/63, pulse 80, temperature 98.2 F (36.8 C), temperature source Oral, resp. rate 18, height 5\' 1"  (1.549 m), weight 66.4 kg (146 lb 4.8 oz), SpO2 100 %.  Lying in bed VS as noted Generalized anasarca  Anteriorly fairly clear [id number]  9-1/4 systolic murmur LSB 1/6 diastolic murmur ULSB Abd obese, pitting edema abdominal wall 2+ edema knees to hips 1+ below knees Peau d'orange look to thighs Not real change to LE's yet  Labs:  Recent Labs Lab 09/24/17 1115 09/25/17 0958 09/27/17 1424 09/28/17 0335 09/29/17 0951 09/30/17 0547  NA 133* 132* 134* 134* 134* 134*  K 5.8* 5.2* 4.8 5.2* 5.0 4.7  CL 104 100* 102 102 102 104  CO2 18* 20* 19* 19* 18* 20*  GLUCOSE 106* 122* 84 53* 60* 71  BUN 66* 69* 70* 71* 79* 78*  CREATININE 2.79* 3.09* 3.28* 3.17* 3.43* 3.50*  CALCIUM 8.9 8.6* 8.8* 8.8* 9.1 9.0  PHOS  --   --   --  7.1* 8.0* 8.0*    Recent Labs Lab 09/28/17 0335 09/29/17 0951 09/30/17 0547  AST  --   --  17  ALT  --   --  14  ALKPHOS   --   --  129*  BILITOT  --   --  1.2  PROT  --   --  6.5  ALBUMIN 1.8* 2.0* 2.1*  2.1*    Recent Labs Lab 09/24/17 1115  09/28/17 0335 09/29/17 0951 09/29/17 1118 09/30/17 0547  WBC 23.2*  < > 24.7* 22.1* 31.3* 26.3*  NEUTROABS 21.0*  --   --   --   --  24.1*  HGB 9.6*  < > 10.6* 8.6* 11.3* 10.2*  HCT 32.4*  < > 36.2 29.1* 38.4 35.3*  MCV 85.9  < > 86.4 86.1 86.1 85.9  PLT 254  < > 230 SPECIMEN CLOTTED 197 179  < > = values in this interval not displayed.  Iron/TIBC/Ferritin/ %Sat    Component Value Date/Time   IRON 9 (L) 09/28/2017 0335   TIBC 267 09/28/2017 0335   FERRITIN 41 07/06/2016 1956   IRONPCTSAT 3 (L) 09/28/2017 0335    Medications: . sodium  chloride    . ferumoxytol Stopped (09/28/17 1515)  . furosemide 160 mg (09/30/17 0531)   . aspirin  81 mg Oral Daily  . feeding supplement  1 Container Oral BID BM  . feeding supplement (ENSURE ENLIVE)  237 mL Oral BID  . lidocaine      . metolazone  5 mg Oral Daily  . metoprolol tartrate  50 mg Oral TID  . pantoprazole  40 mg Oral Q1200  . sodium chloride flush  3 mL Intravenous Q12H    Background: 83 y.o. 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 H/o recurrent episodes of decompensated dCHF. Also recurring blood loss anemia, enlarging lung mass and possible ovarian CA. Admitted 09/21/17  with feeling of unwell (can't really tell my why she came to the hospital but notes indicated SOB/swelling). Creatinine on admission around 1.5, fluid overloaded. Rec'd modest doses lasix, stopped d/t worsening renal function, but on review of weights pt up 10 lb since admission, 20 lb since 08/30/17 cardiology clinic visit and was  getting  IVF's. We were asked to see 09/27/17. Worsening HF probable culprit cause of her worsening renal fx (but to my eye not related to diuretics). Not a long term HD candidate.   Assessment/Recommendations 1. AKI on CKD3 - at time of initial consult, pt with NO evidence of  "overdiuresis" but rather generalized anasarca with 10 lb weight gain since admission, 20 lb since last cards clinic visit on 9/4 (and was overloaded then) Worsening HF was probably culprit cause of her worsening renal fx early in the admission  (but to my eye not related to diuretics at that time). 1. Added metolazone 5 mg/day po yesterday with improvement in UOP but creatinine same to sl worse 2. Continue 160 Q8H Lasix IV/metolazone po 3. Continue foley for UOP monitoring.   4. Given her age and significant comorbids would be a very poor/non- candidate for dialysis and would not offer if kidney failure  (and in fact she would not accept) 2. Anasarca - see #1 3. Pleural effusion - s/p 975 ml R thoracentesis 10/5 4. AF with RVR - recurrent episodes. Poor candidate for anticoagulation.  5. Hyperkalemia - changed diet to renal (for K restriction). K down some today (diuretics helping with that) 6. Acute on chronic diastolic HF - diurese as we can - better with addition metolazone to lasix 7. AI/MR/PAH - per cardiology 8. Anemia -s/p 2 unit transfusion this admission. Current Hb ~ 11. EXTREMELY low tsat and fe. Dose Feraheme X2. 1st dose was 10/3 9. Possible ovarian CA - has declined workup 10. Leukocytosis - worsening. Treated for UTI on admission.  Evaluation by primary team.  11. Disposition - I agree that overall prognosis extremely poor in this unfortunate lady. Dialysis not an option for her. Fully agree with asking palliative care to see.  Jamal Maes, MD [facility] [phone] Pager 09/30/2017, 12:15 PM

## 2017-09-30 NOTE — Progress Notes (Signed)
Paged MD for HR sustaining in 120-130s. Pt sleeping at this time. EKG performed- afib with RVR. BP-128/73 Awaiting callback.

## 2017-09-30 NOTE — Progress Notes (Signed)
PROGRESS NOTE        PATIENT DETAILS Name: LAQUENTA WHITSELL Age: 83 y.o. Sex: female Date of Birth: 12/26/1930 Admit Date: 09/21/2017 Admitting Physician Waldemar Dickens, MD OZD:[id number], Ples Specter, FNP  Brief Narrative: Patient is a 83 y.o. female with history of chronic diastolic heart failure, hypertrophic cardiomyopathy, pulmonary arterial hypertension, known history of lung/ovarian mass (refused workup in the past) admitted with fatigue-thought to be secondary to acute and chronic diastolic heart failure, and worsening renal function. See below for further details  Subjective: Appears very weak-although claims to be better than the past few days-she appears very weak and deconditioned. Brief run of A. fib RVR again this morning. Per RN-poor oral intake-and does not want to be disturbed.  Assessment/Plan: Acute kidney injury on stage III chronic kidney disease: secondary to cardiorenal syndrome, remains on IV Lasix-creatinine seems to have plateaued over the past few days. Per nephrology, patient not a good long-term candidate for hemodialysis. Furthermore-she appears to be very weak and debilitated. Have gone ahead and placed a palliative care consultation this morning. I also spoke with the patient's daughter over the phone.  Acute on chronic diastolic heart failure: Slowly improving in terms of volume-but is very deconditioned and weak. Remains on diuretics-have consulted palliative care team. Nephrology and cardiology continues to follow.   Mild hyperkalemia: Resolved-likely secondary to underlying chronic kidney disease-continue to follow electrolytes periodically.  Leukocytosis: Continues to have significant amount of leukocytosis-she is afebrile-blood cultures on 10/4- currently pending. A chest x-ray on 10/4 showed significant bilateral pleural effusions-which I think are probably related to her underlying congestive heart failure/volume overload-however given  significant leukocytosis-have ordered a thoracocentesis. I will continue to monitor off antimicrobial therapy-until a source of infection becomes apparent.  PAF with episodes of RVR: Continues to have episodes of A. fib with RVR-change metoprolol to 50 mg 3 times a day, add IV metoprolol as needed. Spoke with cardiology on 10/4 (Dr Claudius Sis both agreed that patient is a poor long-term candidate for anticoagulation given her overall clinical situation, furthermore she apparently has a history of chronic blood loss anemia. Currently on low-dose aspirin. Her Chads2vasc score is around 4  History of lung and ovarian mass: She has previously refused workup in the past (see discharge summary in January 2018). PET scan in September 2017 showed hypermetabolic activity in the right lower lobe lung lesion, and in the left ovarian mass. She does have pleural effusions-which although probably is from congestive heart failure and volume overload, could potentially be malignant. We will await cytology.  Minimally elevated troponins: Trend is flat and not consistent with ACS-cardiology following.  Pulmonary hypertension: Likely secondary to left-sided heart failure-further workup in the outpatient setting  UTI: Urine culture with multiple bacterial morphotypes-completed antimicrobial therapy. Remains afebrile and nontoxic appearing-although has significant leukocytosis  Anemia: Hemoglobin currently stable-apparently has a history of chronic blood loss anemia. Plans are to follow at this time.  Deconditioning: Very frail and weak-reevaluated by physical therapy-recommendations are for SNF. However given her overall medical issues-have consulted palliative care. See above.   Ethics/goals of care: Unfortunate 83 year old female with known history of left ovarian and a left lung mass-declined workup in the past, admitted with anasarca in a setting of worsening renal failure/CHF. Although volume status seems to have  improved somewhat, she is now profoundly weak and deconditioned with severe failure  to thrive syndrome. I suspect her overall prognoses is very poor, briefly spoke with the patient's daughter today-she will be coming to the hospital this afternoon-but in the meantime have gone ahead and placed a palliative care consultation.  Morning labs/Imaging ordered: yes  DVT Prophylaxis: SCD's  Code Status: Full code   Family Communication: Daughter over the phone  Disposition Plan: Remain inpatient  Antimicrobial agents: Anti-infectives    Start     Dose/Rate Route Frequency Ordered Stop   09/21/17 1900  cefTRIAXone (ROCEPHIN) 1 g in dextrose 5 % 50 mL IVPB  Status:  Discontinued     1 g 100 mL/hr over 30 Minutes Intravenous Every 24 hours 09/21/17 1817 09/26/17 1304      Procedures: None  CONSULTS:  cardiology and nephrology  Palliative care  Time spent: 25 minutes-Greater than 50% of this time was spent in counseling, explanation of diagnosis, planning of further management, and coordination of care.  MEDICATIONS: Scheduled Meds: . aspirin  81 mg Oral Daily  . feeding supplement  1 Container Oral BID BM  . feeding supplement (ENSURE ENLIVE)  237 mL Oral BID  . lidocaine      . metolazone  5 mg Oral Daily  . metoprolol tartrate  50 mg Oral BID  . pantoprazole  40 mg Oral Q1200  . sodium chloride flush  3 mL Intravenous Q12H   Continuous Infusions: . sodium chloride    . ferumoxytol Stopped (09/28/17 1515)  . furosemide 160 mg (09/30/17 0531)   PRN Meds:.sodium chloride, acetaminophen **OR** acetaminophen, metoprolol tartrate, ondansetron **OR** ondansetron (ZOFRAN) IV, sodium chloride flush   PHYSICAL EXAM: Vital signs: Vitals:   09/30/17 0614 09/30/17 0928 09/30/17 1000 09/30/17 1005  BP: 133/76 132/80  116/63  Pulse: 79 80 60 80  Resp:      Temp: 98.2 F (36.8 C)     TempSrc: Oral     SpO2: 94% 99% 98% 100%  Weight: 66.4 kg (146 lb 4.8 oz)     Height:        Filed Weights   09/28/17 0600 09/29/17 0611 09/30/17 0614  Weight: 68 kg (150 lb) 67.9 kg (149 lb 11.2 oz) 66.4 kg (146 lb 4.8 oz)   Body mass index is 27.64 kg/m.   General appearance :Awake, alert, But very weak and deconditioned Eyes:, pupils equally reactive to light and accomodation,no scleral icterus. HEENT: Atraumatic and Normocephalic Neck: supple Resp:Good air entry bilaterally CVS: S1 S2 regular, no murmurs.  GI: Bowel sounds present, Non tender and not distended with no gaurding, rigidity or rebound. Extremities: B/L Lower Ext shows + edema, both legs are warm to touch Neurology:  Nonfocal-but with generalized weakness Musculoskeletal:No digital cyanosis Skin:No Rash, warm and dry Wounds:N/A  I have personally reviewed following labs and imaging studies  LABORATORY DATA: CBC:  Recent Labs Lab 09/24/17 1115 09/27/17 1216 09/28/17 0335 09/29/17 0951 09/29/17 1118 09/30/17 0547  WBC 23.2* 27.3* 24.7* 22.1* 31.3* 26.3*  NEUTROABS 21.0*  --   --   --   --  24.1*  HGB 9.6* 11.1* 10.6* 8.6* 11.3* 10.2*  HCT 32.4* 36.8 36.2 29.1* 38.4 35.3*  MCV 85.9 86.0 86.4 86.1 86.1 85.9  PLT 254 238 230 SPECIMEN CLOTTED 197 381    Basic Metabolic Panel:  Recent Labs Lab 09/25/17 0958 09/27/17 1424 09/28/17 0335 09/29/17 0951 09/30/17 0547  NA 132* 134* 134* 134* 134*  K 5.2* 4.8 5.2* 5.0 4.7  CL 100* 102 102 102 104  CO2 20*  19* 19* 18* 20*  GLUCOSE 122* 84 53* 60* 71  BUN 69* 70* 71* 79* 78*  CREATININE 3.09* 3.28* 3.17* 3.43* 3.50*  CALCIUM 8.6* 8.8* 8.8* 9.1 9.0  PHOS  --   --  7.1* 8.0* 8.0*    GFR: Estimated Creatinine Clearance: 10.2 mL/min (A) (by C-G formula based on SCr of 3.5 mg/dL (H)).  Liver Function Tests:  Recent Labs Lab 09/28/17 0335 09/29/17 0951 09/30/17 0547  AST  --   --  17  ALT  --   --  14  ALKPHOS  --   --  129*  BILITOT  --   --  1.2  PROT  --   --  6.5  ALBUMIN 1.8* 2.0* 2.1*  2.1*   No results for input(s): LIPASE,  AMYLASE in the last 168 hours. No results for input(s): AMMONIA in the last 168 hours.  Coagulation Profile: No results for input(s): INR, PROTIME in the last 168 hours.  Cardiac Enzymes: No results for input(s): CKTOTAL, CKMB, CKMBINDEX, TROPONINI in the last 168 hours.  BNP (last 3 results)  Recent Labs  04/01/17 1142 04/28/17 1054  PROBNP 31,525* 3,325.0*    HbA1C: No results for input(s): HGBA1C in the last 72 hours.  CBG: No results for input(s): GLUCAP in the last 168 hours.  Lipid Profile: No results for input(s): CHOL, HDL, LDLCALC, TRIG, CHOLHDL, LDLDIRECT in the last 72 hours.  Thyroid Function Tests: No results for input(s): TSH, T4TOTAL, FREET4, T3FREE, THYROIDAB in the last 72 hours.  Anemia Panel:  Recent Labs  09/28/17 0335  TIBC 267  IRON 9*    Urine analysis:    Component Value Date/Time   COLORURINE AMBER (A) 09/21/2017 1404   APPEARANCEUR CLOUDY (A) 09/21/2017 1404   LABSPEC 1.015 09/21/2017 1404   PHURINE 5.0 09/21/2017 1404   GLUCOSEU NEGATIVE 09/21/2017 1404   HGBUR SMALL (A) 09/21/2017 1404   BILIRUBINUR NEGATIVE 09/21/2017 1404   KETONESUR NEGATIVE 09/21/2017 1404   PROTEINUR 100 (A) 09/21/2017 1404   UROBILINOGEN 0.2 08/16/2014 0535   NITRITE NEGATIVE 09/21/2017 1404   LEUKOCYTESUR LARGE (A) 09/21/2017 1404    Sepsis Labs: Lactic Acid, Venous    Component Value Date/Time   LATICACIDVEN 1.28 01/01/2017 1603    MICROBIOLOGY: Recent Results (from the past 240 hour(s))  Culture, Urine     Status: Abnormal   Collection Time: 09/21/17  2:04 PM  Result Value Ref Range Status   Specimen Description URINE, RANDOM  Final   Special Requests NONE  Final   Culture MULTIPLE SPECIES PRESENT, SUGGEST RECOLLECTION (A)  Final   Report Status 09/22/2017 FINAL  Final    RADIOLOGY STUDIES/RESULTS: Ct Abdomen Pelvis Wo Contrast  Result Date: 09/29/2017 CLINICAL DATA:  Chest pain and shortness-of-breath we fusion suspected. History of  breast cancer. Right lower lung mass and left ovarian mass. EXAM: CT CHEST, ABDOMEN AND PELVIS WITHOUT CONTRAST TECHNIQUE: Multidetector CT imaging of the chest, abdomen and pelvis was performed following the standard protocol without IV contrast. COMPARISON:  Chest CT 01/01/2017 and PET-CT 09/08/2016 FINDINGS: CT CHEST FINDINGS Cardiovascular: Stable cardiomegaly. Ascending thoracic aorta measures 4.8 cm in AP diameter slightly larger (previously 4.6 cm). Mild prominence of the main pulmonary arteries. Mild calcified plaque over the thoracic aorta Mediastinum/Nodes: 1.3 cm precarinal lymph node. Difficult to assess for hilar adenopathy on this noncontrast study. Remaining mediastinal structures are unremarkable. Lungs/Pleura: Examination demonstrates a large right pleural effusion and moderate left pleural effusion with associated basilar atelectasis. Cannot exclude  infection over the left base. Dependent atelectasis over the left upper lobe. Evidence of patient's known right lower lobe lung mass with interval increase in size measuring approximately 3.7 x 5.0 cm, although margins difficult to identify on this noncontrast study. Airways otherwise unremarkable. CT ABDOMEN PELVIS FINDINGS Hepatobiliary: Minimal nodular contour to the liver. Moderate cholelithiasis with a 2.5 cm stone. Biliary tree within normal. Pancreas: Unremarkable. Spleen: Unremarkable. Adrenals/Urinary Tract: Adrenal glands are within normal. Kidneys are normal in size without hydronephrosis or nephrolithiasis. 1.5 cm cyst over the mid pole left kidney. Subcentimeter hypodensity over the mid pole right kidney too small to characterize but likely a cyst. Subcentimeter hyperdensity over the lower pole cortex of the right kidney likely a hyperdense cysts and not significantly changed. Foley catheter is present within a collapsed bladder. Stomach/Bowel: Stomach and small bowel are within normal. Appendix is within normal. There is mild wall  thickening and mild adjacent inflammatory change involving the descending colon in the left mid abdomen likely mild acute colitis. Vascular/Lymphatic: Moderate calcified plaque of the abdominal aorta and iliac arteries. No definite adenopathy. Reproductive: Uterus within normal with a few calcified fibroids present. Interval enlargement of patient's known suspicious left ovarian mass measuring 5.6 x 5.9 cm likely primary neoplasm. Other: Minimal free fluid in the pelvis. Mild ascites. Diffuse subcutaneous edema is present. Musculoskeletal: Degenerate change throughout the spine with degenerative change of the hips. No focal lytic or sclerotic lesions. IMPRESSION: Large right effusion and moderate size left effusion with associated basilar atelectasis and posterior dependent left upper lobe atelectasis. Cannot exclude infection in the left base. Interval enlargement of known medial right lower lobe mass measuring approximately 3.7 x 5.0 cm likely primary bronchogenic neoplasm. Mild wall thickening of the descending colon with adjacent mild inflammatory change as findings may be due to mild acute colitis of infectious or inflammatory nature. 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 Mild ascites. Aneurysmal dilatation of the ascending thoracic aorta measuring 4.8 cm (previously 4.6 cm. Recommend semi-annual imaging followup by CTA or MRA and referral to cardiothoracic surgery if not already obtained. This recommendation follows 2010 ACCF/AHA/AATS/ACR/ASA/SCA/SCAI/SIR/STS/SVM Guidelines for the Diagnosis and Management of Patients With Thoracic Aortic Disease. Circulation. 2010; 121: e266-e36. Cholelithiasis. Renal cysts. 1 cm hyperdense right renal cortical lesion likely a hyperdense cysts and not significantly changed. Aortic Atherosclerosis (ICD10-I70.0). Stable cardiomegaly. Electronically Signed   By: Marin Olp M.D.   On: 09/29/2017  23:08   Dg Chest 2 View  Result Date: 09/21/2017 CLINICAL DATA:  83 year old female with history of abdominal pain radiating into the shoulders. EXAM: CHEST  2 VIEW COMPARISON:  Chest x-ray 09/11/2017. FINDINGS: Lung volumes are low. Diffuse peribronchial cuffing. Patchy areas of interstitial prominence, most evident throughout the right mid to lower lung. No confluent consolidative airspace disease. Small right pleural effusion. Crowding of the pulmonary vasculature without frank pulmonary edema. Heart size appears mildly enlarged. Upper mediastinal contours are distorted by patient positioning. Aortic atherosclerosis. Surgical clips project over the left axillary region, presumably from prior lymph node dissection. IMPRESSION: 1. The appearance of the chest suggests bronchitis, potentially with developing right lower lobe bronchopneumonia. 2. Small right pleural effusion. 3. Mild cardiomegaly. 4. Aortic atherosclerosis. Electronically Signed   By: Vinnie Langton M.D.   On: 09/21/2017 13:22   Dg Chest 2 View  Result Date: 09/11/2017 CLINICAL DATA:  Cough. EXAM: CHEST  2 VIEW COMPARISON:  08/19/2017 FINDINGS: There is marked cardiac enlargement. No  pleural effusion or edema identified. No airspace opacities identified. Mild chronic interstitial coarsening identified. IMPRESSION: Cardiac enlargement.  No acute findings. Electronically Signed   By: Kerby Moors M.D.   On: 09/11/2017 21:38   Ct Chest Wo Contrast  Result Date: 09/29/2017 CLINICAL DATA:  Chest pain and shortness-of-breath we fusion suspected. History of breast cancer. Right lower lung mass and left ovarian mass. EXAM: CT CHEST, ABDOMEN AND PELVIS WITHOUT CONTRAST TECHNIQUE: Multidetector CT imaging of the chest, abdomen and pelvis was performed following the standard protocol without IV contrast. COMPARISON:  Chest CT 01/01/2017 and PET-CT 09/08/2016 FINDINGS: CT CHEST FINDINGS Cardiovascular: Stable cardiomegaly. Ascending thoracic aorta  measures 4.8 cm in AP diameter slightly larger (previously 4.6 cm). Mild prominence of the main pulmonary arteries. Mild calcified plaque over the thoracic aorta Mediastinum/Nodes: 1.3 cm precarinal lymph node. Difficult to assess for hilar adenopathy on this noncontrast study. Remaining mediastinal structures are unremarkable. Lungs/Pleura: Examination demonstrates a large right pleural effusion and moderate left pleural effusion with associated basilar atelectasis. Cannot exclude infection over the left base. Dependent atelectasis over the left upper lobe. Evidence of patient's known right lower lobe lung mass with interval increase in size measuring approximately 3.7 x 5.0 cm, although margins difficult to identify on this noncontrast study. Airways otherwise unremarkable. CT ABDOMEN PELVIS FINDINGS Hepatobiliary: Minimal nodular contour to the liver. Moderate cholelithiasis with a 2.5 cm stone. Biliary tree within normal. Pancreas: Unremarkable. Spleen: Unremarkable. Adrenals/Urinary Tract: Adrenal glands are within normal. Kidneys are normal in size without hydronephrosis or nephrolithiasis. 1.5 cm cyst over the mid pole left kidney. Subcentimeter hypodensity over the mid pole right kidney too small to characterize but likely a cyst. Subcentimeter hyperdensity over the lower pole cortex of the right kidney likely a hyperdense cysts and not significantly changed. Foley catheter is present within a collapsed bladder. Stomach/Bowel: Stomach and small bowel are within normal. Appendix is within normal. There is mild wall thickening and mild adjacent inflammatory change involving the descending colon in the left mid abdomen likely mild acute colitis. Vascular/Lymphatic: Moderate calcified plaque of the abdominal aorta and iliac arteries. No definite adenopathy. Reproductive: Uterus within normal with a few calcified fibroids present. Interval enlargement of patient's known suspicious left ovarian mass measuring 5.6  x 5.9 cm likely primary neoplasm. Other: Minimal free fluid in the pelvis. Mild ascites. Diffuse subcutaneous edema is present. Musculoskeletal: Degenerate change throughout the spine with degenerative change of the hips. No focal lytic or sclerotic lesions. IMPRESSION: Large right effusion and moderate size left effusion with associated basilar atelectasis and posterior dependent left upper lobe atelectasis. Cannot exclude infection in the left base. Interval enlargement of known medial right lower lobe mass measuring approximately 3.7 x 5.0 cm likely primary bronchogenic neoplasm. Mild wall thickening of the descending colon with adjacent mild inflammatory change as findings may be due to mild acute colitis of infectious or inflammatory nature. 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 Mild ascites. Aneurysmal dilatation of the ascending thoracic aorta measuring 4.8 cm (previously 4.6 cm. Recommend semi-annual imaging followup by CTA or MRA and referral to cardiothoracic surgery if not already obtained. This recommendation follows 2010 ACCF/AHA/AATS/ACR/ASA/SCA/SCAI/SIR/STS/SVM Guidelines for the Diagnosis and Management of Patients With Thoracic Aortic Disease. Circulation. 2010; 121: [id number]. Cholelithiasis. Renal cysts. 1 cm hyperdense right renal cortical lesion likely a hyperdense cysts and not significantly changed. Aortic Atherosclerosis (ICD10-I70.0). Stable cardiomegaly. Electronically Signed   By: Marin Olp M.D.  On: 09/29/2017 23:08   US Renal  Result Date: 09/26/2017 CLINICAL DATA:  Renal failure. EXAM: RENAL / URINARY TRACT ULTRASOUND COMPLETE COMPARISON:  CT abdomen pelvis dated May 20, 2017. FINDINGS: Right Kidney: Length: 11.5 cm. Echogenicity within normal limits. No mass or hydronephrosis visualized. An 8 mm simple cyst is seen in the midpole. There is a 9 mm lower pole cystic lesion demonstrating low level  internal echogenicity, consistent with a complicated cyst as seen on prior CT. Left Kidney: Length: 12.1 cm. Echogenicity within normal limits. No mass or hydronephrosis visualized. There is a 2.5 cm exophytic simple cyst arising from the upper pole. Bladder: Appears normal for degree of bladder distention. Bilateral pleural effusions and small ascites are noted. IMPRESSION: 1. No acute renal abnormality. 2. Bilateral pleural effusions and small ascites. Electronically Signed   By: Titus Dubin M.D.   On: 09/26/2017 12:12   Dg Chest Port 1v Same Day  Result Date: 09/29/2017 CLINICAL DATA:  Shortness of breath. History of congestive heart failure. EXAM: PORTABLE CHEST 1 VIEW COMPARISON:  09/21/2017 FINDINGS: Chronic cardiomegaly. Marked worsening of congestive heart failure with interstitial and alveolar edema and bilateral effusions. No acute bone finding. IMPRESSION: Marked worsening of congestive heart failure with interstitial and alveolar pulmonary edema and bilateral effusions. Electronically Signed   By: Nelson Chimes M.D.   On: 09/29/2017 13:30     LOS: 9 days   Oren Binet, MD  [facility] Pager:[phone]  If 7PM-7AM, please contact night-coverage [email or web address] Password [email or web address] 09/30/2017, 10:35 AM

## 2017-09-30 NOTE — Procedures (Signed)
PROCEDURE SUMMARY:  Successful US guided right thoracentesis. Yielded 975 mL of clear yellow fluid. Pt tolerated procedure well. No immediate complications.  Specimen was sent for labs. CXR ordered.  Ascencion Dike PA-C 09/30/2017 10:20 AM

## 2017-10-01 DIAGNOSIS — Z515 Encounter for palliative care: Secondary | ICD-10-CM

## 2017-10-01 DIAGNOSIS — Z7189 Other specified counseling: Secondary | ICD-10-CM

## 2017-10-01 DIAGNOSIS — R0602 Shortness of breath: Secondary | ICD-10-CM

## 2017-10-01 DIAGNOSIS — R531 Weakness: Secondary | ICD-10-CM

## 2017-10-01 DIAGNOSIS — J90 Pleural effusion, not elsewhere classified: Secondary | ICD-10-CM

## 2017-10-01 LAB — CBC
HEMATOCRIT: 34 % — AB (ref 36.0–46.0)
Hemoglobin: 10 g/dL — ABNORMAL LOW (ref 12.0–15.0)
MCH: 25.3 pg — AB (ref 26.0–34.0)
MCHC: 29.4 g/dL — AB (ref 30.0–36.0)
MCV: 86.1 fL (ref 78.0–100.0)
Platelets: 179 10*3/uL (ref 150–400)
RBC: 3.95 MIL/uL (ref 3.87–5.11)
RDW: 20.1 % — AB (ref 11.5–15.5)
WBC: 27.7 10*3/uL — ABNORMAL HIGH (ref 4.0–10.5)

## 2017-10-01 LAB — RENAL FUNCTION PANEL
Albumin: 2.2 g/dL — ABNORMAL LOW (ref 3.5–5.0)
Anion gap: 14 (ref 5–15)
BUN: 88 mg/dL — ABNORMAL HIGH (ref 6–20)
CALCIUM: 9.1 mg/dL (ref 8.9–10.3)
CO2: 20 mmol/L — AB (ref 22–32)
CREATININE: 3.48 mg/dL — AB (ref 0.44–1.00)
Chloride: 103 mmol/L (ref 101–111)
GFR, EST AFRICAN AMERICAN: 13 mL/min — AB (ref >60)
GFR, EST NON AFRICAN AMERICAN: 11 mL/min — AB (ref >60)
Glucose, Bld: 84 mg/dL (ref 65–99)
Phosphorus: 8.4 mg/dL — ABNORMAL HIGH (ref 2.5–4.6)
Potassium: 4.8 mmol/L (ref 3.5–5.1)
Sodium: 137 mmol/L (ref 135–145)

## 2017-10-01 MED ORDER — FENTANYL CITRATE (PF) 100 MCG/2ML IJ SOLN: 12.5000 ug | INTRAMUSCULAR | Status: DC | PRN

## 2017-10-01 NOTE — Clinical Social Work Note (Signed)
Clinical Social Work Assessment  Patient Details  Name: Valerie Downs MRN: [id number] Date of Birth: 07/01/1930  Date of referral:  10/01/17               Reason for consult:  End of Life/Hospice                Permission sought to share information with:  Family Supports Permission granted to share information::  Yes, Verbal Permission Granted  Name::     Rendi Mapel  Agency::  [facility]  Relationship::  relative  Contact Information:  [phone]  Housing/Transportation Living arrangements for the past 2 months:  [place] of Information:  Other (Comment Required) Patient Interpreter Needed:  None Criminal Activity/Legal Involvement Pertinent to Current Situation/Hospitalization:  No - Comment as needed Significant Relationships:  Adult Children, Other Family Members Lives with:    Do you feel safe going back to the place where you live?  No Need for family participation in patient care:  Yes (Comment)  Care giving concerns:  No family at bedside. Per RN a lot of family was at bedside for palliative meeting but left shortly after meeting was over   Facilities manager / plan: CSW acknowledge consult for patients family wanting patient to be place in residential hospice. Per consult family would only like [facility] due to patients family being in the area. CSW reach out to Harmon Pier from [facility] and made a referral. Harmon Pier stated she will look at patient and availability at [facility]. Harmon Pier stated she will contact CSW in morning with availability. CSW to follow upw ith family once bed offer is available.  Employment status:  Retired Nurse, adult PT Recommendations:   Dance movement psychotherapist) Information / Referral to community resources:  Other (Comment Required) (Hospice)  Patient/Family's Response to care:  CSW unable to assess patient at this time. Patient is not doing well  Patient/Family's Understanding of and Emotional Response to  Diagnosis, Current Treatment, and Prognosis:  Family appreciative of CSW role in patients care. Family is starting to understand that patient is not doing well and is finally accepting comfort care  Emotional Assessment Appearance:  Appears stated age Attitude/Demeanor/Rapport:  Unable to Assess Affect (typically observed):  Unable to Assess Orientation:  Oriented to Place, Oriented to Self Alcohol / Substance use:  Not Applicable Psych involvement (Current and /or in the community):  No (Comment)  Discharge Needs  Concerns to be addressed:  Other (Comment Required (end of life) Readmission within the last 30 days:  No Current discharge risk:  Terminally ill Barriers to Discharge:  Hospice Bed not available   Wende Neighbors, LCSW 10/01/2017, 5:39 PM

## 2017-10-01 NOTE — Progress Notes (Signed)
PMT no charge note  Consult request received Chart reviewed Patient seen and examined Discussed with son Jeneen Rinks and few other family members at bedside Patient not awake/alert enough to participate in discussions.  Call placed, unable to reach [facility] daughter Katharine Look at [phone].   I received a call back from another daughter, stating that daughter Katharine Look is at work, she will arrive at the hospital later this afternoon.   PLAN: Family meeting today, in the patient's room for code status and overall goals of care discussions, including appropriate disposition discussions scheduled for 1430-1500 on 10-01-17.   Full note and final recommendations to follow.  Thank you for the consult.  Loistine Chance MD [facility] health palliative medicine team [phone]

## 2017-10-01 NOTE — Progress Notes (Signed)
Patient continued to be lethargic on 7am-7pm shift. Responds to voice and touch. Vitals remain stable. Denies any pain. Report given to day shift RN.

## 2017-10-01 NOTE — Consult Note (Signed)
Consultation Note Date: 10/01/2017   Patient Name: Valerie Downs  DOB: 1930-12-19  MRN: [id number]  Age / Sex: 83 y.o., female  PCP: Golden Circle, FNP Referring Physician: Patrecia Pour, Christean Grief, MD  Reason for Consultation: Establishing goals of care  HPI/Patient Profile: 83 y.o. female  admitted on 09/21/2017    Clinical Assessment and Goals of Care:  Patient is a 83 year old lady with a past medical history significant for diastolic heart failure, history of known lung and ovarian masses for which the patient has declined workup in the past, anemia of chronic disease, paroxysmal atrial fibrillation, ongoing frailty and deconditioning, stage III chronic kidney disease now with acute kidney injury component, generalized anasarca, recent thoracenteses during which 975 mL of fluid were removed. Patient's serum albumin has ranged from 1.8-2.2 g/dL.  A palliative consultation has been requested for goals of care discussions, especially as the patient has remained with failure to thrive, ongoing lethargy, ongoing declining oral intake, ongoing worsening renal function, worsening generalized swelling.  I met with Valerie Downs in her room. She opened her eyes when her name was called. She complained that my hands were cold otherwise she did not respond much. Several of her family members arrived in the room. He had an extended family meeting inside the patient's room. Patient was partially awake elected not to respond. I tried to directly discuss with the patient several times.  Chart was reviewed. Patient was seen and evaluated by my partner Dr. Micheline Rough in May 2018. At that time as well, patient declined to have additional workup for her lung and ovarian masses.   I introduced myself and palliative care as follows: Palliative medicine is specialized medical care for people living with serious illness. It focuses on  providing relief from the symptoms and stress of a serious illness. The goal is to improve quality of life for both the patient and the family.  Goals wishes and values discussed. Patient's children unanimously stated that they would not want to prolong the patient's suffering. They stated that she has been saying "leave me alone" for months now. They are clear that she would not want any biopsy or ongoing treatments. They're agreeable that she is not a dialysis candidate.  We have establish DO NOT RESUSCITATE/DO NOT INTUBATE. We have established that comfort is a singular most important goal to aim for at this time, comfort care best represents the patient's previously expressed wishes, additionally hospice philosophy of care discussed in great detail. All of the family's questions answered to the best of my ability.  NEXT OF KIN  5 adult children: 1. Daughter Valerie Downs at [phone] 2. Daughter Valerie Downs at [phone] 3. Son Valerie Downs who is also the oldest child [phone] daughter Valerie Downs [phone] 5 son Valerie Downs was present for the family meeting.   SUMMARY OF RECOMMENDATIONS    1. Fentanyl IV PRN 2. DNR DNI 3. CSW consult for residential hospice, choices discussed with family, they prefer [facility], [place].  4. no dialysis Code Status/Advance Care Planning:  DNR    Symptom Management:    as above   Palliative Prophylaxis:   Bowel Regimen  Additional Recommendations (Limitations, Scope, Preferences):  Full Comfort Care  Psycho-social/Spiritual:   Desire for further Chaplaincy support:yes  Additional Recommendations: Education on Hospice  Prognosis:   < 2 weeks  Discharge Planning: Hospice facility      Primary Diagnoses: Present on Admission: . Symptomatic anemia . Acute on chronic diastolic CHF (congestive heart failure) ([place]) . Lower urinary tract infectious disease . HYPERTENSION, BENIGN SYSTEMIC . Elevated troponin   I have reviewed the medical record,  interviewed the patient and family, and examined the patient. The following aspects are pertinent.  Past Medical History:  Diagnosis Date  . Anemia   . Aortic insufficiency   . Blood transfusion without reported diagnosis   . Breast cancer ([place])   . Cardiomyopathy ([place])   . CHF (congestive heart failure) ([place])   . CKD (chronic kidney disease), stage III ([place])   . History of echocardiogram    Echo 4/18: severe focal basal and mod conc LVH, EF 50-55, no RWMA, Gr 2 DD, mod AI, severe MR, mild LAE, mildly reduced RVSF, severe TR, mod PI, PASP 65, mild pericardial eff  . Hypertension   . Mitral regurgitation    Social History   Social History  . Marital status: Widowed    Spouse name: N/A  . Number of children: 10  . Years of education: 5   Occupational History  . Retired    Social History Main Topics  . Smoking status: Current Some Day Smoker    Packs/day: 0.25    Years: 50.00    Types: Cigarettes  . Smokeless tobacco: Former Systems developer    Types: Snuff     Comment: per patient has not used snuff in a while (10/18/16)  . Alcohol use No  . Drug use: No  . Sexual activity: No   Other Topics Concern  . None   Social History Narrative   Denies abuse and feels safe at home.    Family History  Problem Relation Age of Onset  . Hypertension Mother   . Cancer Father    Scheduled Meds: . aspirin  81 mg Oral Daily  . feeding supplement  1 Container Oral BID BM  . feeding supplement (ENSURE ENLIVE)  237 mL Oral BID  . metolazone  5 mg Oral Daily  . metoprolol tartrate  50 mg Oral TID  . pantoprazole  40 mg Oral Q1200  . sodium chloride flush  3 mL Intravenous Q12H   Continuous Infusions: . sodium chloride    . ferumoxytol Stopped (09/28/17 1515)  . furosemide 160 mg (10/01/17 1516)   PRN Meds:.sodium chloride, acetaminophen **OR** acetaminophen, metoprolol tartrate, ondansetron **OR** ondansetron (ZOFRAN) IV, sodium chloride flush Medications Prior to Admission:  Prior to  Admission medications   Medication Sig Start Date End Date Taking? Authorizing Provider  feeding supplement (BOOST / RESOURCE BREEZE) LIQD Take 1 Container by mouth 2 (two) times daily between meals. 05/21/17  Yes Sheikh, [place], DO  feeding supplement, ENSURE ENLIVE, (ENSURE ENLIVE) LIQD Take 237 mLs by mouth daily. 05/21/17  Yes Sheikh, Omair Latif, DO  furosemide (LASIX) 40 MG tablet Take 2 tablets (80 mg total) by mouth daily. 08/30/17  Yes Imogene Burn, PA-C  metoprolol tartrate (LOPRESSOR) 25 MG tablet Take 1 tablet (25 mg total) by mouth 2 (two) times daily. 06/09/16  Yes Burtis Junes, NP  ciprofloxacin (CIPRO) 250 MG tablet  Take 1 tablet (250 mg total) by mouth every 12 (twelve) hours. Patient not taking: Reported on 09/21/2017 09/11/17   Daleen Bo, MD   Allergies  Allergen Reactions  . Penicillins Anaphylaxis and Swelling    Has patient had a PCN reaction causing immediate rash, facial/tongue/throat swelling, SOB or lightheadedness with hypotension: Yes Has patient had a PCN reaction causing severe rash involving mucus membranes or skin necrosis: Yes Has patient had a PCN reaction that required hospitalization Yes Has patient had a PCN reaction occurring within the last 10 years: No If all of the above answers are "NO", then may proceed with Cephalosporin use.    Review of Systems Non verbal   Physical Exam Frail weak cachectic elderly lady resting in bed S1-S2 Clear breath sounds Muscle wasting evident Abdomen soft nontender Trace edema Opens eyes and recognizes family members in the room, does not verbalize much, has generalized weakness  Vital Signs: BP (!) 136/51 (BP Location: Right Arm)   Pulse 79   Temp (!) 97.5 F (36.4 C) (Oral)   Resp 18   Ht _0  (1.549 m)   Wt 66.8 kg (147 lb 3.2 oz)   SpO2 100%   BMI 27.81 kg/m  Pain Assessment: No/denies pain   Pain Score: Asleep  PPS 20% SpO2: SpO2: 100 % O2 Device:SpO2: 100 % O2 Flow Rate: .O2 Flow  Rate (L/min): 2 L/min  IO: Intake/output summary:  Intake/Output Summary (Last 24 hours) at 10/01/17 1628 Last data filed at 10/01/17 1616  Gross per 24 hour  Intake              283 ml  Output              900 ml  Net             -617 ml    LBM: Last BM Date: 09/28/17 Baseline Weight: Weight: 57.6 kg (127 lb) Most recent weight: Weight: 66.8 kg (147 lb 3.2 oz)     Palliative Assessment/Data:     Time In: 1520  Time Out:  1630 Time Total:  70 min  Greater than 50%  of this time was spent counseling and coordinating care related to the above assessment and plan.  Signed by: Loistine Chance, MD  [phone]  Please contact Palliative Medicine Team phone at [phone] for questions and concerns.  For individual provider: See Shea Evans

## 2017-10-01 NOTE — Progress Notes (Signed)
PROGRESS NOTE [facility]   LAKIYAH ARNTSON   ZOX:[id number] DOB: 1930-10-04  DOA: 09/21/2017 PCP: [facility], FNP   Brief Narrative:  Valerie Downs is 83 year old female with history of chronic diastolic heart failure, hypertrophic cardiomyopathy, pulmonary artery hypertension, no history of prolonged/ovarian mass (refused workup in the past). Patient presented to the emergency department complaining of generalized weakness and just feeling bad. She was admitted with fatigue thought to be secondary to acute on chronic diastolic heart failure and worsening renal function. Despite symptomatic treatment and volume removal patient continues to decline as she is very deconditioned and weak. Nephrology was consulted and she is not candidate for hemodialysis. Patient with very poor prognosis and palliative care has been consulted.  Subjective: Patient seen and examined, she is somewhat lethargic. She states "I just feel bad". She appears pretty weak and deconditioned, per RN poor oral intake.  Assessment & Plan: Acute kidney injury on stage III chronic kidney disease secondary to cardiorenal syndrome.Patient has remained on IV Lasix and creatinine seems to be plateauing. Per nephrology patient is not a good long-term candidate for hemodialysis. Patient continues to decline as she is very weak and deconditioned. Palliative care has been consulted for goals of care and to discuss DO NOT RESUSCITATE status. For now continue IV diuresis as tolerated, patient continues on negative balance.  Acute on chronic diastolic heart failure - negative balance, remains on IV diuretic. Once palliative care discussed goals of care, will adjust medications.   Leukocytosis - Significantly elevated WBC, she is afebrile and blood cultures on 10/4 so far negative. Chest x-ray on 10/14 show significant bilateral pleural effusion, thoracentesis was done which showed transudate effusion by light's criteria. This is  unlikely to be infectious process. We'll continue to monitor off antimicrobial therapy until clear social for patient becomes apparent. CBC for today pending  PAF - medications were adjusted due to some intermittent A. Fib with RVR episodes. Since metoprolol was adjusted patient's heart rate has remained stable. Case was discussed with cardiology who did not recommend anticoagulation given her overall clinical situation. She is a poor long-term candidate for a/c. Continue low-dose aspirin for now  History of lung and ovarian mass - she previously refused workup in the past. PET scan in September 2017, showed hypermetabolic activity in the right lower lobe lung lesion and in the left ovary mass. She has some pleural effusion with transudate fluid which is likely consistent with CHF and volume overload. I waiting cytology from thoracentesis.  Anemia of chronic disease - hemoglobin currently stable. Continue to monitor for now  UTI - she completed course of antibiotic with ceftriaxone, urine culture show multiple organism. Patient continues to be afebrile and nontoxic despite leukocytosis  Deconditioning - very frail and weak unfortunate 83 year old female who is actively declining. She half history of left ovarian and left lung mass, for which she refuses a workup in the past. She was admitted with anasarca in the setting of worsening renal failure/CHF, although volume status seems to be improved she continues to be declining and unresponsive. Her prognosis is very poor and palliative care has been consulted  DVT prophylaxis: SCD's  Code Status: Full code  Family Communication: None at bedside  Disposition Plan: TBD   Consultants:   Nephrology   Procedures:   None   Antimicrobials: Anti-infectives    Start     Dose/Rate Route Frequency Ordered Stop   09/21/17 1900  cefTRIAXone (ROCEPHIN) 1 g in dextrose 5 % 50  mL IVPB  Status:  Discontinued     1 g 100 mL/hr over 30 Minutes Intravenous  Every 24 hours 09/21/17 1817 09/26/17 1304          Objective: Vitals:   09/30/17 2010 09/30/17 2127 10/01/17 0524 10/01/17 1222  BP: (!) 144/71 (!) 127/51 (!) 145/63 (!) 136/51  Pulse: 85 88 79 79  Resp: 16 18 16  (!) 73  Temp:  98.2 F (36.8 C) 97.7 F (36.5 C) (!) 97.5 F (36.4 C)  TempSrc:  Axillary Axillary Oral  SpO2: 100% 100% 100% 100%  Weight:   66.8 kg (147 lb 3.2 oz)   Height:        Intake/Output Summary (Last 24 hours) at 10/01/17 1345 Last data filed at 10/01/17 1043  Gross per 24 hour  Intake                3 ml  Output              900 ml  Net             -897 ml   Filed Weights   09/29/17 0611 09/30/17 0614 10/01/17 0524  Weight: 67.9 kg (149 lb 11.2 oz) 66.4 kg (146 lb 4.8 oz) 66.8 kg (147 lb 3.2 oz)    Examination:  General exam: somewhat lethargic, frail elderly woman. Cachectic  HEENT: PERRLA, OP moist and clear Respiratory system: good air entry bilaterally Cardiovascular system: S1 & S2 heard, RRR. No JVD, murmurs, rubs or gallops Gastrointestinal system: Abdomen is nondistended, soft and nontender. No organomegaly or masses felt. Normal bowel sounds heard. Central nervous system: oriented to person only No focal neurological deficits, generalized weakness Extremities: trace LE edema.    Skin: No rashes, warm dry   Data Reviewed: I have personally reviewed following labs and imaging studies  CBC:  Recent Labs Lab 09/27/17 1216 09/28/17 0335 09/29/17 0951 09/29/17 1118 09/30/17 0547  WBC 27.3* 24.7* 22.1* 31.3* 26.3*  NEUTROABS  --   --   --   --  24.1*  HGB 11.1* 10.6* 8.6* 11.3* 10.2*  HCT 36.8 36.2 29.1* 38.4 35.3*  MCV 86.0 86.4 86.1 86.1 85.9  PLT 238 230 SPECIMEN CLOTTED 197 875   Basic Metabolic Panel:  Recent Labs Lab 09/27/17 1424 09/28/17 0335 09/29/17 0951 09/30/17 0547 10/01/17 0410  NA 134* 134* 134* 134* 137  K 4.8 5.2* 5.0 4.7 4.8  CL 102 102 102 104 103  CO2 19* 19* 18* 20* 20*  GLUCOSE 84 53* 60* 71 84   BUN 70* 71* 79* 78* 88*  CREATININE 3.28* 3.17* 3.43* 3.50* 3.48*  CALCIUM 8.8* 8.8* 9.1 9.0 9.1  PHOS  --  7.1* 8.0* 8.0* 8.4*   GFR: Estimated Creatinine Clearance: 10.3 mL/min (A) (by C-G formula based on SCr of 3.48 mg/dL (H)). Liver Function Tests:  Recent Labs Lab 09/28/17 0335 09/29/17 0951 09/30/17 0547 10/01/17 0410  AST  --   --  17  --   ALT  --   --  14  --   ALKPHOS  --   --  129*  --   BILITOT  --   --  1.2  --   PROT  --   --  6.5  --   ALBUMIN 1.8* 2.0* 2.1*  2.1* 2.2*   No results for input(s): LIPASE, AMYLASE in the last 168 hours. No results for input(s): AMMONIA in the last 168 hours. Coagulation Profile: No results for input(s): INR, PROTIME  in the last 168 hours. Cardiac Enzymes: No results for input(s): CKTOTAL, CKMB, CKMBINDEX, TROPONINI in the last 168 hours. BNP (last 3 results)  Recent Labs  04/01/17 1142 04/28/17 1054  PROBNP 31,525* 3,325.0*   HbA1C: No results for input(s): HGBA1C in the last 72 hours. CBG: No results for input(s): GLUCAP in the last 168 hours. Lipid Profile: No results for input(s): CHOL, HDL, LDLCALC, TRIG, CHOLHDL, LDLDIRECT in the last 72 hours. Thyroid Function Tests: No results for input(s): TSH, T4TOTAL, FREET4, T3FREE, THYROIDAB in the last 72 hours. Anemia Panel: No results for input(s): VITAMINB12, FOLATE, FERRITIN, TIBC, IRON, RETICCTPCT in the last 72 hours. Sepsis Labs: No results for input(s): PROCALCITON, LATICACIDVEN in the last 168 hours.  Recent Results (from the past 240 hour(s))  Culture, Urine     Status: Abnormal   Collection Time: 09/21/17  2:04 PM  Result Value Ref Range Status   Specimen Description URINE, RANDOM  Final   Special Requests NONE  Final   Culture MULTIPLE SPECIES PRESENT, SUGGEST RECOLLECTION (A)  Final   Report Status 09/22/2017 FINAL  Final  Culture, blood (routine x 2)     Status: None (Preliminary result)   Collection Time: 09/29/17  1:21 PM  Result Value Ref Range  Status   Specimen Description BLOOD LEFT HAND  Final   Special Requests IN PEDIATRIC BOTTLE Blood Culture adequate volume  Final   Culture NO GROWTH 2 DAYS  Final   Report Status PENDING  Incomplete  Culture, blood (routine x 2)     Status: None (Preliminary result)   Collection Time: 09/29/17  9:10 PM  Result Value Ref Range Status   Specimen Description BLOOD RIGHT ARM  Final   Special Requests IN PEDIATRIC BOTTLE Blood Culture adequate volume  Final   Culture NO GROWTH 2 DAYS  Final   Report Status PENDING  Incomplete  Culture, body fluid-bottle     Status: None (Preliminary result)   Collection Time: 09/30/17 10:42 AM  Result Value Ref Range Status   Specimen Description FLUID RIGHT PLEURAL  Final   Special Requests NONE  Final   Culture NO GROWTH 1 DAY  Final   Report Status PENDING  Incomplete  Gram stain     Status: None   Collection Time: 09/30/17 10:42 AM  Result Value Ref Range Status   Specimen Description FLUID RIGHT PLEURAL  Final   Special Requests NONE  Final   Gram Stain   Final    MODERATE WBC PRESENT,BOTH PMN AND MONONUCLEAR NO ORGANISMS SEEN    Report Status 09/30/2017 FINAL  Final      Radiology Studies: Ct Abdomen Pelvis Wo Contrast  Result Date: 09/29/2017 CLINICAL DATA:  Chest pain and shortness-of-breath we fusion suspected. History of breast cancer. Right lower lung mass and left ovarian mass. EXAM: CT CHEST, ABDOMEN AND PELVIS WITHOUT CONTRAST TECHNIQUE: Multidetector CT imaging of the chest, abdomen and pelvis was performed following the standard protocol without IV contrast. COMPARISON:  Chest CT 01/01/2017 and PET-CT 09/08/2016 FINDINGS: CT CHEST FINDINGS Cardiovascular: Stable cardiomegaly. Ascending thoracic aorta measures 4.8 cm in AP diameter slightly larger (previously 4.6 cm). Mild prominence of the main pulmonary arteries. Mild calcified plaque over the thoracic aorta Mediastinum/Nodes: 1.3 cm precarinal lymph node. Difficult to assess for hilar  adenopathy on this noncontrast study. Remaining mediastinal structures are unremarkable. Lungs/Pleura: Examination demonstrates a large right pleural effusion and moderate left pleural effusion with associated basilar atelectasis. Cannot exclude infection over the left base. Dependent  atelectasis over the left upper lobe. Evidence of patient's known right lower lobe lung mass with interval increase in size measuring approximately 3.7 x 5.0 cm, although margins difficult to identify on this noncontrast study. Airways otherwise unremarkable. CT ABDOMEN PELVIS FINDINGS Hepatobiliary: Minimal nodular contour to the liver. Moderate cholelithiasis with a 2.5 cm stone. Biliary tree within normal. Pancreas: Unremarkable. Spleen: Unremarkable. Adrenals/Urinary Tract: Adrenal glands are within normal. Kidneys are normal in size without hydronephrosis or nephrolithiasis. 1.5 cm cyst over the mid pole left kidney. Subcentimeter hypodensity over the mid pole right kidney too small to characterize but likely a cyst. Subcentimeter hyperdensity over the lower pole cortex of the right kidney likely a hyperdense cysts and not significantly changed. Foley catheter is present within a collapsed bladder. Stomach/Bowel: Stomach and small bowel are within normal. Appendix is within normal. There is mild wall thickening and mild adjacent inflammatory change involving the descending colon in the left mid abdomen likely mild acute colitis. Vascular/Lymphatic: Moderate calcified plaque of the abdominal aorta and iliac arteries. No definite adenopathy. Reproductive: Uterus within normal with a few calcified fibroids present. Interval enlargement of patient's known suspicious left ovarian mass measuring 5.6 x 5.9 cm likely primary neoplasm. Other: Minimal free fluid in the pelvis. Mild ascites. Diffuse subcutaneous edema is present. Musculoskeletal: Degenerate change throughout the spine with degenerative change of the hips. No focal lytic or  sclerotic lesions. IMPRESSION: Large right effusion and moderate size left effusion with associated basilar atelectasis and posterior dependent left upper lobe atelectasis. Cannot exclude infection in the left base. Interval enlargement of known medial right lower lobe mass measuring approximately 3.7 x 5.0 cm likely primary bronchogenic neoplasm. Mild wall thickening of the descending colon with adjacent mild inflammatory change as findings may be due to mild acute colitis of infectious or inflammatory nature. 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 Mild ascites. Aneurysmal dilatation of the ascending thoracic aorta measuring 4.8 cm (previously 4.6 cm. Recommend semi-annual imaging followup by CTA or MRA and referral to cardiothoracic surgery if not already obtained. This recommendation follows 2010 ACCF/AHA/AATS/ACR/ASA/SCA/SCAI/SIR/STS/SVM Guidelines for the Diagnosis and Management of Patients With Thoracic Aortic Disease. Circulation. 2010; 121: e266-e36. Cholelithiasis. Renal cysts. 1 cm hyperdense right renal cortical lesion likely a hyperdense cysts and not significantly changed. Aortic Atherosclerosis (ICD10-I70.0). Stable cardiomegaly. Electronically Signed   By: Marin Olp M.D.   On: 09/29/2017 23:08   Dg Chest 1 View  Result Date: 09/30/2017 CLINICAL DATA:  Status post right thoracentesis EXAM: CHEST 1 VIEW COMPARISON:  None. FINDINGS: The heart remains moderately enlarged. There is persistent dense opacity at the left lung base. The right pleural effusion has nearly resolved and there is no right pneumothorax after thoracentesis. IMPRESSION: No pneumothorax after right thoracentesis. Electronically Signed   By: Marybelle Killings M.D.   On: 09/30/2017 10:33   Ct Chest Wo Contrast  Result Date: 09/29/2017 CLINICAL DATA:  Chest pain and shortness-of-breath we fusion suspected. History of breast cancer. Right lower lung mass  and left ovarian mass. EXAM: CT CHEST, ABDOMEN AND PELVIS WITHOUT CONTRAST TECHNIQUE: Multidetector CT imaging of the chest, abdomen and pelvis was performed following the standard protocol without IV contrast. COMPARISON:  Chest CT 01/01/2017 and PET-CT 09/08/2016 FINDINGS: CT CHEST FINDINGS Cardiovascular: Stable cardiomegaly. Ascending thoracic aorta measures 4.8 cm in AP diameter slightly larger (previously 4.6 cm). Mild prominence of the main pulmonary arteries. Mild calcified plaque over the thoracic aorta Mediastinum/Nodes: 1.3  cm precarinal lymph node. Difficult to assess for hilar adenopathy on this noncontrast study. Remaining mediastinal structures are unremarkable. Lungs/Pleura: Examination demonstrates a large right pleural effusion and moderate left pleural effusion with associated basilar atelectasis. Cannot exclude infection over the left base. Dependent atelectasis over the left upper lobe. Evidence of patient's known right lower lobe lung mass with interval increase in size measuring approximately 3.7 x 5.0 cm, although margins difficult to identify on this noncontrast study. Airways otherwise unremarkable. CT ABDOMEN PELVIS FINDINGS Hepatobiliary: Minimal nodular contour to the liver. Moderate cholelithiasis with a 2.5 cm stone. Biliary tree within normal. Pancreas: Unremarkable. Spleen: Unremarkable. Adrenals/Urinary Tract: Adrenal glands are within normal. Kidneys are normal in size without hydronephrosis or nephrolithiasis. 1.5 cm cyst over the mid pole left kidney. Subcentimeter hypodensity over the mid pole right kidney too small to characterize but likely a cyst. Subcentimeter hyperdensity over the lower pole cortex of the right kidney likely a hyperdense cysts and not significantly changed. Foley catheter is present within a collapsed bladder. Stomach/Bowel: Stomach and small bowel are within normal. Appendix is within normal. There is mild wall thickening and mild adjacent inflammatory  change involving the descending colon in the left mid abdomen likely mild acute colitis. Vascular/Lymphatic: Moderate calcified plaque of the abdominal aorta and iliac arteries. No definite adenopathy. Reproductive: Uterus within normal with a few calcified fibroids present. Interval enlargement of patient's known suspicious left ovarian mass measuring 5.6 x 5.9 cm likely primary neoplasm. Other: Minimal free fluid in the pelvis. Mild ascites. Diffuse subcutaneous edema is present. Musculoskeletal: Degenerate change throughout the spine with degenerative change of the hips. No focal lytic or sclerotic lesions. IMPRESSION: Large right effusion and moderate size left effusion with associated basilar atelectasis and posterior dependent left upper lobe atelectasis. Cannot exclude infection in the left base. Interval enlargement of known medial right lower lobe mass measuring approximately 3.7 x 5.0 cm likely primary bronchogenic neoplasm. Mild wall thickening of the descending colon with adjacent mild inflammatory change as findings may be due to mild acute colitis of infectious or inflammatory nature. 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 Mild ascites. Aneurysmal dilatation of the ascending thoracic aorta measuring 4.8 cm (previously 4.6 cm. Recommend semi-annual imaging followup by CTA or MRA and referral to cardiothoracic surgery if not already obtained. This recommendation follows 2010 ACCF/AHA/AATS/ACR/ASA/SCA/SCAI/SIR/STS/SVM Guidelines for the Diagnosis and Management of Patients With Thoracic Aortic Disease. Circulation. 2010; 121: [id number]. Cholelithiasis. Renal cysts. 1 cm hyperdense right renal cortical lesion likely a hyperdense cysts and not significantly changed. Aortic Atherosclerosis (ICD10-I70.0). Stable cardiomegaly. Electronically Signed   By: Marin Olp M.D.   On: 09/29/2017 23:08   Ir Thoracentesis Asp Pleural Space  W/img Guide  Result Date: 09/30/2017 INDICATION: Shortness of breath. Right-sided pleural effusion. Request diagnostic and therapeutic thoracentesis. EXAM: ULTRASOUND GUIDED RIGHT THORACENTESIS MEDICATIONS: None. COMPLICATIONS: None immediate. PROCEDURE: An ultrasound guided thoracentesis was thoroughly discussed with the patient and questions answered. The benefits, risks, alternatives and complications were also discussed. The patient understands and wishes to proceed with the procedure. Written consent was obtained. Ultrasound was performed to localize and mark an adequate pocket of fluid in the right chest. The area was then prepped and draped in the normal sterile fashion. 1% Lidocaine was used for local anesthesia. Under ultrasound guidance a Safe-T-Centesis catheter was introduced. Thoracentesis was performed. The catheter was removed and a dressing applied. FINDINGS: A total of approximately 975 mL of clear  yellow fluid was removed. Samples were sent to the laboratory as requested by the clinical team. IMPRESSION: Successful ultrasound guided right thoracentesis yielding 975 mL of pleural fluid. Read by: Ascencion Dike PA-C Electronically Signed   By: Jerilynn Mages.  Shick M.D.   On: 09/30/2017 10:39    Scheduled Meds: . aspirin  81 mg Oral Daily  . feeding supplement  1 Container Oral BID BM  . feeding supplement (ENSURE ENLIVE)  237 mL Oral BID  . metolazone  5 mg Oral Daily  . metoprolol tartrate  50 mg Oral TID  . pantoprazole  40 mg Oral Q1200  . sodium chloride flush  3 mL Intravenous Q12H   Continuous Infusions: . sodium chloride    . ferumoxytol Stopped (09/28/17 1515)  . furosemide Stopped (10/01/17 0914)     LOS: 10 days    Time spent: Total of 25 minutes spent with pt, greater than 50% of which was spent in discussion of  treatment, counseling and coordination of care    Chipper Oman, MD Pager: Text Page via [email or web address]   If 7PM-7AM, please contact  night-coverage [email or web address] 10/01/2017, 1:45 PM

## 2017-10-01 NOTE — Progress Notes (Signed)
[facility] Rounding Note  Plan for palliative meeting with family noted. Creatinine unchanged. GFR of 13 probably overestimate given her small body mass Not a candidate for dialysis I think a palliative approach would be very appropriate in this lade Will see in follow up tomorrow.  Jamal Maes, MD [facility] [phone] Pager 10/01/2017, 12:44 PM

## 2017-10-02 DIAGNOSIS — R531 Weakness: Secondary | ICD-10-CM

## 2017-10-02 LAB — RENAL FUNCTION PANEL
ANION GAP: 15 (ref 5–15)
Albumin: 2.1 g/dL — ABNORMAL LOW (ref 3.5–5.0)
BUN: 93 mg/dL — ABNORMAL HIGH (ref 6–20)
CHLORIDE: 101 mmol/L (ref 101–111)
CO2: 20 mmol/L — ABNORMAL LOW (ref 22–32)
Calcium: 8.7 mg/dL — ABNORMAL LOW (ref 8.9–10.3)
Creatinine, Ser: 3.44 mg/dL — ABNORMAL HIGH (ref 0.44–1.00)
GFR, EST AFRICAN AMERICAN: 13 mL/min — AB (ref >60)
GFR, EST NON AFRICAN AMERICAN: 11 mL/min — AB (ref >60)
Glucose, Bld: 63 mg/dL — ABNORMAL LOW (ref 65–99)
POTASSIUM: 4.7 mmol/L (ref 3.5–5.1)
Phosphorus: 7.8 mg/dL — ABNORMAL HIGH (ref 2.5–4.6)
Sodium: 136 mmol/L (ref 135–145)

## 2017-10-02 LAB — PH, BODY FLUID: pH, Body Fluid: 7.7

## 2017-10-02 MED ORDER — ASPIRIN 81 MG PO CHEW: 81.0000 mg | CHEWABLE_TABLET | Freq: Every day | ORAL | Status: AC

## 2017-10-02 MED ORDER — FENTANYL CITRATE (PF) 100 MCG/2ML IJ SOLN: 12.5000 ug | INTRAMUSCULAR | 0 refills | Status: AC | PRN

## 2017-10-02 MED ORDER — METOPROLOL TARTRATE 50 MG PO TABS: 50.0000 mg | ORAL_TABLET | Freq: Two times a day (BID) | ORAL | Status: AC

## 2017-10-02 NOTE — Discharge Summary (Signed)
Physician Discharge Summary  NOVELLE ADDAIR  EVO:[id number]  DOB: 01-26-1930  DOA: 09/21/2017 PCP: Golden Circle, FNP  Admit date: 09/21/2017 Discharge date: 10/02/2017  Admitted From: Home  Disposition:  SNF   Discharge Condition: Hospice Care  CODE STATUS: DNR/DNI  Diet recommendation:  Regular    Brief/Interim Summary: For full details see H&P and progress notes but in brief, Valerie Downs is 83 year old female with history of chronic diastolic heart failure, hypertrophic cardiomyopathy, pulmonary artery hypertension, no history of prolonged/ovarian mass (refused workup in the past). Patient presented to the emergency department complaining of generalized weakness and just feeling bad. She was admitted with fatigue thought to be secondary to acute on chronic diastolic heart failure and worsening renal function. Despite symptomatic treatment and volume removal patient continues to decline as she is very deconditioned and weak. Nephrology was consulted and she is not candidate for hemodialysis. Patient with very poor prognosis and palliative care was consulted. Family decided to continue with comfort measures only. Patient was made DNR and was referred to residential hospice.    Subjective: Patient seen and examined, she continues to be lethargic, open eyes when called her name but not very responsive. No acute events overnight. Remains afebrile. Continues with poor appetite.   Discharge Diagnoses/Hospital Course:  Acute kidney injury on stage III chronic kidney disease secondary to cardiorenal syndrome.Patient was  on IV Lasix and creatinine seems to be plateauing. Per nephrology patient is not a good long-term candidate for hemodialysis. Patient continues to decline as she is very weak and deconditioned. Palliative care has been consulted for goals of care, decision was made to transition to comfort measures only. Discontinue IV diuresis as there is no significant benefit - patient going for  residential hospice   Acute on chronic diastolic heart failure - negative balance, but minimal urine output. D/c IV lasix   Leukocytosis - Significantly elevated WBC, she is afebrile and blood cultures on 10/4 so far negative. Chest x-ray on 10/14 show significant bilateral pleural effusion, thoracentesis was done which showed transudate effusion by light's criteria. This is unlikely to be infectious process. We'll continue to monitor off antimicrobial therapy until clear source of infection becomes apparent.  PAF - medications were adjusted due to some intermittent A. Fib with RVR episodes. Since metoprolol was adjusted patient's heart rate has remained stable. Case was discussed with cardiology who did not recommend anticoagulation given her overall clinical situation. She is a poor long-term candidate for a/c. Continue low-dose aspirin for now  History of lung and ovarian mass - she previously refused workup in the past. PET scan in September 2017, showed hypermetabolic activity in the right lower lobe lung lesion and in the left ovary mass. She has some pleural effusion with transudate fluid which is likely consistent with CHF and volume overload. Cytology from thoracentesis pending.   Anemia of chronic disease - hemoglobin currently stable. Continue to monitor for now  UTI - she completed course of antibiotic with ceftriaxone, urine culture show multiple organism. Patient continues to be afebrile and nontoxic despite leukocytosis  Deconditioning - very frail and weak unfortunate 83 year old female who is actively declining. She half history of left ovarian and left lung mass, for which she refuses a workup in the past. She was admitted with anasarca in the setting of worsening renal failure/CHF, although volume status seems to be improved she continues to be declining and unresponsive. Her prognosis is very poor decision was made for comfort measures only.  Discharge  Instructions  You were cared for by a hospitalist during your hospital stay. If you have any questions about your discharge medications or the care you received while you were in the hospital after you are discharged, you can call the unit and asked to speak with the hospitalist on call if the hospitalist that took care of you is not available. Once you are discharged, your primary care physician will handle any further medical issues. Please note that NO REFILLS for any discharge medications will be authorized once you are discharged, as it is imperative that you return to your primary care physician (or establish a relationship with a primary care physician if you do not have one) for your aftercare needs so that they can reassess your need for medications and monitor your lab values.  Discharge Instructions    Call MD for:  difficulty breathing, headache or visual disturbances    Complete by:  As directed    Call MD for:  extreme fatigue    Complete by:  As directed    Call MD for:  hives    Complete by:  As directed    Call MD for:  persistant dizziness or light-headedness    Complete by:  As directed    Call MD for:  persistant nausea and vomiting    Complete by:  As directed    Call MD for:  redness, tenderness, or signs of infection (pain, swelling, redness, odor or green/yellow discharge around incision site)    Complete by:  As directed    Call MD for:  severe uncontrolled pain    Complete by:  As directed    Call MD for:  temperature >100.4    Complete by:  As directed    Diet general    Complete by:  As directed      Allergies as of 10/02/2017      Reactions   Penicillins Anaphylaxis, Swelling   Has patient had a PCN reaction causing immediate rash, facial/tongue/throat swelling, SOB or lightheadedness with hypotension: Yes Has patient had a PCN reaction causing severe rash involving mucus membranes or skin necrosis: Yes Has patient had a PCN reaction that required  hospitalization Yes Has patient had a PCN reaction occurring within the last 10 years: No If all of the above answers are "NO", then may proceed with Cephalosporin use.      Medication List    STOP taking these medications   ciprofloxacin 250 MG tablet Commonly known as:  CIPRO   furosemide 40 MG tablet Commonly known as:  LASIX     TAKE these medications   aspirin 81 MG chewable tablet Chew 1 tablet (81 mg total) by mouth daily.   feeding supplement Liqd Take 1 Container by mouth 2 (two) times daily between meals. What changed:  Another medication with the same name was removed. Continue taking this medication, and follow the directions you see here.   fentaNYL 100 MCG/2ML injection Commonly known as:  SUBLIMAZE Inject 0.25 mLs (12.5 mcg total) into the vein every 2 (two) hours as needed for moderate pain (Or shortness of breath).   metoprolol tartrate 50 MG tablet Commonly known as:  LOPRESSOR Take 1 tablet (50 mg total) by mouth 2 (two) times daily. What changed:  medication strength  how much to take       Allergies  Allergen Reactions  . Penicillins Anaphylaxis and Swelling    Has patient had a PCN reaction causing immediate rash, facial/tongue/throat swelling,  SOB or lightheadedness with hypotension: Yes Has patient had a PCN reaction causing severe rash involving mucus membranes or skin necrosis: Yes Has patient had a PCN reaction that required hospitalization Yes Has patient had a PCN reaction occurring within the last 10 years: No If all of the above answers are "NO", then may proceed with Cephalosporin use.     Consultations:  Palliative Care   Nephrology    Procedures/Studies: Ct Abdomen Pelvis Wo Contrast  Result Date: 09/29/2017 CLINICAL DATA:  Chest pain and shortness-of-breath we fusion suspected. History of breast cancer. Right lower lung mass and left ovarian mass. EXAM: CT CHEST, ABDOMEN AND PELVIS WITHOUT CONTRAST TECHNIQUE: Multidetector  CT imaging of the chest, abdomen and pelvis was performed following the standard protocol without IV contrast. COMPARISON:  Chest CT 01/01/2017 and PET-CT 09/08/2016 FINDINGS: CT CHEST FINDINGS Cardiovascular: Stable cardiomegaly. Ascending thoracic aorta measures 4.8 cm in AP diameter slightly larger (previously 4.6 cm). Mild prominence of the main pulmonary arteries. Mild calcified plaque over the thoracic aorta Mediastinum/Nodes: 1.3 cm precarinal lymph node. Difficult to assess for hilar adenopathy on this noncontrast study. Remaining mediastinal structures are unremarkable. Lungs/Pleura: Examination demonstrates a large right pleural effusion and moderate left pleural effusion with associated basilar atelectasis. Cannot exclude infection over the left base. Dependent atelectasis over the left upper lobe. Evidence of patient's known right lower lobe lung mass with interval increase in size measuring approximately 3.7 x 5.0 cm, although margins difficult to identify on this noncontrast study. Airways otherwise unremarkable. CT ABDOMEN PELVIS FINDINGS Hepatobiliary: Minimal nodular contour to the liver. Moderate cholelithiasis with a 2.5 cm stone. Biliary tree within normal. Pancreas: Unremarkable. Spleen: Unremarkable. Adrenals/Urinary Tract: Adrenal glands are within normal. Kidneys are normal in size without hydronephrosis or nephrolithiasis. 1.5 cm cyst over the mid pole left kidney. Subcentimeter hypodensity over the mid pole right kidney too small to characterize but likely a cyst. Subcentimeter hyperdensity over the lower pole cortex of the right kidney likely a hyperdense cysts and not significantly changed. Foley catheter is present within a collapsed bladder. Stomach/Bowel: Stomach and small bowel are within normal. Appendix is within normal. There is mild wall thickening and mild adjacent inflammatory change involving the descending colon in the left mid abdomen likely mild acute colitis.  Vascular/Lymphatic: Moderate calcified plaque of the abdominal aorta and iliac arteries. No definite adenopathy. Reproductive: Uterus within normal with a few calcified fibroids present. Interval enlargement of patient's known suspicious left ovarian mass measuring 5.6 x 5.9 cm likely primary neoplasm. Other: Minimal free fluid in the pelvis. Mild ascites. Diffuse subcutaneous edema is present. Musculoskeletal: Degenerate change throughout the spine with degenerative change of the hips. No focal lytic or sclerotic lesions. IMPRESSION: Large right effusion and moderate size left effusion with associated basilar atelectasis and posterior dependent left upper lobe atelectasis. Cannot exclude infection in the left base. Interval enlargement of known medial right lower lobe mass measuring approximately 3.7 x 5.0 cm likely primary bronchogenic neoplasm. Mild wall thickening of the descending colon with adjacent mild inflammatory change as findings may be due to mild acute colitis of infectious or inflammatory nature. 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 Mild ascites. Aneurysmal dilatation of the ascending thoracic aorta measuring 4.8 cm (previously 4.6 cm. Recommend semi-annual imaging followup by CTA or MRA and referral to cardiothoracic surgery if not already obtained. This recommendation follows 2010 ACCF/AHA/AATS/ACR/ASA/SCA/SCAI/SIR/STS/SVM Guidelines for the Diagnosis and Management of Patients With Thoracic Aortic  Disease. Circulation. 2010; 121: e266-e36. Cholelithiasis. Renal cysts. 1 cm hyperdense right renal cortical lesion likely a hyperdense cysts and not significantly changed. Aortic Atherosclerosis (ICD10-I70.0). Stable cardiomegaly. Electronically Signed   By: Marin Olp M.D.   On: 09/29/2017 23:08   Dg Chest 1 View  Result Date: 09/30/2017 CLINICAL DATA:  Status post right thoracentesis EXAM: CHEST 1 VIEW COMPARISON:   None. FINDINGS: The heart remains moderately enlarged. There is persistent dense opacity at the left lung base. The right pleural effusion has nearly resolved and there is no right pneumothorax after thoracentesis. IMPRESSION: No pneumothorax after right thoracentesis. Electronically Signed   By: Marybelle Killings M.D.   On: 09/30/2017 10:33   Dg Chest 2 View  Result Date: 09/21/2017 CLINICAL DATA:  83 year old female with history of abdominal pain radiating into the shoulders. EXAM: CHEST  2 VIEW COMPARISON:  Chest x-ray 09/11/2017. FINDINGS: Lung volumes are low. Diffuse peribronchial cuffing. Patchy areas of interstitial prominence, most evident throughout the right mid to lower lung. No confluent consolidative airspace disease. Small right pleural effusion. Crowding of the pulmonary vasculature without frank pulmonary edema. Heart size appears mildly enlarged. Upper mediastinal contours are distorted by patient positioning. Aortic atherosclerosis. Surgical clips project over the left axillary region, presumably from prior lymph node dissection. IMPRESSION: 1. The appearance of the chest suggests bronchitis, potentially with developing right lower lobe bronchopneumonia. 2. Small right pleural effusion. 3. Mild cardiomegaly. 4. Aortic atherosclerosis. Electronically Signed   By: Vinnie Langton M.D.   On: 09/21/2017 13:22   Dg Chest 2 View  Result Date: 09/11/2017 CLINICAL DATA:  Cough. EXAM: CHEST  2 VIEW COMPARISON:  08/19/2017 FINDINGS: There is marked cardiac enlargement. No pleural effusion or edema identified. No airspace opacities identified. Mild chronic interstitial coarsening identified. IMPRESSION: Cardiac enlargement.  No acute findings. Electronically Signed   By: Kerby Moors M.D.   On: 09/11/2017 21:38   Ct Chest Wo Contrast  Result Date: 09/29/2017 CLINICAL DATA:  Chest pain and shortness-of-breath we fusion suspected. History of breast cancer. Right lower lung mass and left ovarian mass.  EXAM: CT CHEST, ABDOMEN AND PELVIS WITHOUT CONTRAST TECHNIQUE: Multidetector CT imaging of the chest, abdomen and pelvis was performed following the standard protocol without IV contrast. COMPARISON:  Chest CT 01/01/2017 and PET-CT 09/08/2016 FINDINGS: CT CHEST FINDINGS Cardiovascular: Stable cardiomegaly. Ascending thoracic aorta measures 4.8 cm in AP diameter slightly larger (previously 4.6 cm). Mild prominence of the main pulmonary arteries. Mild calcified plaque over the thoracic aorta Mediastinum/Nodes: 1.3 cm precarinal lymph node. Difficult to assess for hilar adenopathy on this noncontrast study. Remaining mediastinal structures are unremarkable. Lungs/Pleura: Examination demonstrates a large right pleural effusion and moderate left pleural effusion with associated basilar atelectasis. Cannot exclude infection over the left base. Dependent atelectasis over the left upper lobe. Evidence of patient's known right lower lobe lung mass with interval increase in size measuring approximately 3.7 x 5.0 cm, although margins difficult to identify on this noncontrast study. Airways otherwise unremarkable. CT ABDOMEN PELVIS FINDINGS Hepatobiliary: Minimal nodular contour to the liver. Moderate cholelithiasis with a 2.5 cm stone. Biliary tree within normal. Pancreas: Unremarkable. Spleen: Unremarkable. Adrenals/Urinary Tract: Adrenal glands are within normal. Kidneys are normal in size without hydronephrosis or nephrolithiasis. 1.5 cm cyst over the mid pole left kidney. Subcentimeter hypodensity over the mid pole right kidney too small to characterize but likely a cyst. Subcentimeter hyperdensity over the lower pole cortex of the right kidney likely a hyperdense cysts and not significantly changed. Foley  catheter is present within a collapsed bladder. Stomach/Bowel: Stomach and small bowel are within normal. Appendix is within normal. There is mild wall thickening and mild adjacent inflammatory change involving the  descending colon in the left mid abdomen likely mild acute colitis. Vascular/Lymphatic: Moderate calcified plaque of the abdominal aorta and iliac arteries. No definite adenopathy. Reproductive: Uterus within normal with a few calcified fibroids present. Interval enlargement of patient's known suspicious left ovarian mass measuring 5.6 x 5.9 cm likely primary neoplasm. Other: Minimal free fluid in the pelvis. Mild ascites. Diffuse subcutaneous edema is present. Musculoskeletal: Degenerate change throughout the spine with degenerative change of the hips. No focal lytic or sclerotic lesions. IMPRESSION: Large right effusion and moderate size left effusion with associated basilar atelectasis and posterior dependent left upper lobe atelectasis. Cannot exclude infection in the left base. Interval enlargement of known medial right lower lobe mass measuring approximately 3.7 x 5.0 cm likely primary bronchogenic neoplasm. Mild wall thickening of the descending colon with adjacent mild inflammatory change as findings may be due to mild acute colitis of infectious or inflammatory nature. 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 Mild ascites. Aneurysmal dilatation of the ascending thoracic aorta measuring 4.8 cm (previously 4.6 cm. Recommend semi-annual imaging followup by CTA or MRA and referral to cardiothoracic surgery if not already obtained. This recommendation follows 2010 ACCF/AHA/AATS/ACR/ASA/SCA/SCAI/SIR/STS/SVM Guidelines for the Diagnosis and Management of Patients With Thoracic Aortic Disease. Circulation. 2010; 121: [id number]. Cholelithiasis. Renal cysts. 1 cm hyperdense right renal cortical lesion likely a hyperdense cysts and not significantly changed. Aortic Atherosclerosis (ICD10-I70.0). Stable cardiomegaly. Electronically Signed   By: Marin Olp M.D.   On: 09/29/2017 23:08   US Renal  Result Date: 09/26/2017 CLINICAL DATA:   Renal failure. EXAM: RENAL / URINARY TRACT ULTRASOUND COMPLETE COMPARISON:  CT abdomen pelvis dated May 20, 2017. FINDINGS: Right Kidney: Length: 11.5 cm. Echogenicity within normal limits. No mass or hydronephrosis visualized. An 8 mm simple cyst is seen in the midpole. There is a 9 mm lower pole cystic lesion demonstrating low level internal echogenicity, consistent with a complicated cyst as seen on prior CT. Left Kidney: Length: 12.1 cm. Echogenicity within normal limits. No mass or hydronephrosis visualized. There is a 2.5 cm exophytic simple cyst arising from the upper pole. Bladder: Appears normal for degree of bladder distention. Bilateral pleural effusions and small ascites are noted. IMPRESSION: 1. No acute renal abnormality. 2. Bilateral pleural effusions and small ascites. Electronically Signed   By: Titus Dubin M.D.   On: 09/26/2017 12:12   Dg Chest Port 1v Same Day  Result Date: 09/29/2017 CLINICAL DATA:  Shortness of breath. History of congestive heart failure. EXAM: PORTABLE CHEST 1 VIEW COMPARISON:  09/21/2017 FINDINGS: Chronic cardiomegaly. Marked worsening of congestive heart failure with interstitial and alveolar edema and bilateral effusions. No acute bone finding. IMPRESSION: Marked worsening of congestive heart failure with interstitial and alveolar pulmonary edema and bilateral effusions. Electronically Signed   By: Nelson Chimes M.D.   On: 09/29/2017 13:30   Ir Thoracentesis Asp Pleural Space W/img Guide  Result Date: 09/30/2017 INDICATION: Shortness of breath. Right-sided pleural effusion. Request diagnostic and therapeutic thoracentesis. EXAM: ULTRASOUND GUIDED RIGHT THORACENTESIS MEDICATIONS: None. COMPLICATIONS: None immediate. PROCEDURE: An ultrasound guided thoracentesis was thoroughly discussed with the patient and questions answered. The benefits, risks, alternatives and complications were also discussed. The patient understands and wishes to proceed with the procedure.  Written consent was obtained. Ultrasound  was performed to localize and mark an adequate pocket of fluid in the right chest. The area was then prepped and draped in the normal sterile fashion. 1% Lidocaine was used for local anesthesia. Under ultrasound guidance a Safe-T-Centesis catheter was introduced. Thoracentesis was performed. The catheter was removed and a dressing applied. FINDINGS: A total of approximately 975 mL of clear yellow fluid was removed. Samples were sent to the laboratory as requested by the clinical team. IMPRESSION: Successful ultrasound guided right thoracentesis yielding 975 mL of pleural fluid. Read by: Ascencion Dike PA-C Electronically Signed   By: Jerilynn Mages.  Shick M.D.   On: 09/30/2017 10:39    Discharge Exam: Vitals:   10/01/17 2029 10/02/17 0405  BP: (!) 139/55 (!) 155/55  Pulse: 74 82  Resp: 18   Temp: (!) 97.4 F (36.3 C) 98.5 F (36.9 C)  SpO2: 100% 100%   Vitals:   10/01/17 0524 10/01/17 1222 10/01/17 2029 10/02/17 0405  BP: (!) 145/63 (!) 136/51 (!) 139/55 (!) 155/55  Pulse: 79 79 74 82  Resp: 16 18 18    Temp: 97.7 F (36.5 C) (!) 97.5 F (36.4 C) (!) 97.4 F (36.3 C) 98.5 F (36.9 C)  TempSrc: Axillary Oral Oral Oral  SpO2: 100% 100% 100% 100%  Weight: 66.8 kg (147 lb 3.2 oz)   66.6 kg (146 lb 12.8 oz)  Height:        General: Lethargic  Cardiovascular: RRR, S1/S2 + Respiratory: Good air entry  Abdominal: Soft Extremities: no edema   The results of significant diagnostics from this hospitalization (including imaging, microbiology, ancillary and laboratory) are listed below for reference.     Microbiology: Recent Results (from the past 240 hour(s))  Culture, blood (routine x 2)     Status: None (Preliminary result)   Collection Time: 09/29/17  1:21 PM  Result Value Ref Range Status   Specimen Description BLOOD LEFT HAND  Final   Special Requests IN PEDIATRIC BOTTLE Blood Culture adequate volume  Final   Culture NO GROWTH 2 DAYS  Final   Report  Status PENDING  Incomplete  Culture, blood (routine x 2)     Status: None (Preliminary result)   Collection Time: 09/29/17  9:10 PM  Result Value Ref Range Status   Specimen Description BLOOD RIGHT ARM  Final   Special Requests IN PEDIATRIC BOTTLE Blood Culture adequate volume  Final   Culture NO GROWTH 2 DAYS  Final   Report Status PENDING  Incomplete  Culture, body fluid-bottle     Status: None (Preliminary result)   Collection Time: 09/30/17 10:42 AM  Result Value Ref Range Status   Specimen Description FLUID RIGHT PLEURAL  Final   Special Requests NONE  Final   Culture NO GROWTH 1 DAY  Final   Report Status PENDING  Incomplete  Gram stain     Status: None   Collection Time: 09/30/17 10:42 AM  Result Value Ref Range Status   Specimen Description FLUID RIGHT PLEURAL  Final   Special Requests NONE  Final   Gram Stain   Final    MODERATE WBC PRESENT,BOTH PMN AND MONONUCLEAR NO ORGANISMS SEEN    Report Status 09/30/2017 FINAL  Final     Labs: BNP (last 3 results)  Recent Labs  08/19/17 2014 09/21/17 1223 09/24/17 1109  BNP >4,500.0* >4,500.0* >8,756.4*   Basic Metabolic Panel:  Recent Labs Lab 09/28/17 0335 09/29/17 0951 09/30/17 0547 10/01/17 0410 10/02/17 0527  NA 134* 134* 134* 137 136  K  5.2* 5.0 4.7 4.8 4.7  CL 102 102 104 103 101  CO2 19* 18* 20* 20* 20*  GLUCOSE 53* 60* 71 84 63*  BUN 71* 79* 78* 88* 93*  CREATININE 3.17* 3.43* 3.50* 3.48* 3.44*  CALCIUM 8.8* 9.1 9.0 9.1 8.7*  PHOS 7.1* 8.0* 8.0* 8.4* 7.8*   Liver Function Tests:  Recent Labs Lab 09/28/17 0335 09/29/17 0951 09/30/17 0547 10/01/17 0410 10/02/17 0527  AST  --   --  17  --   --   ALT  --   --  14  --   --   ALKPHOS  --   --  129*  --   --   BILITOT  --   --  1.2  --   --   PROT  --   --  6.5  --   --   ALBUMIN 1.8* 2.0* 2.1*  2.1* 2.2* 2.1*   No results for input(s): LIPASE, AMYLASE in the last 168 hours. No results for input(s): AMMONIA in the last 168  hours. CBC:  Recent Labs Lab 09/28/17 0335 09/29/17 0951 09/29/17 1118 09/30/17 0547 10/01/17 1347  WBC 24.7* 22.1* 31.3* 26.3* 27.7*  NEUTROABS  --   --   --  24.1*  --   HGB 10.6* 8.6* 11.3* 10.2* 10.0*  HCT 36.2 29.1* 38.4 35.3* 34.0*  MCV 86.4 86.1 86.1 85.9 86.1  PLT 230 SPECIMEN CLOTTED 197 179 179   Cardiac Enzymes: No results for input(s): CKTOTAL, CKMB, CKMBINDEX, TROPONINI in the last 168 hours. BNP: Invalid input(s): POCBNP CBG: No results for input(s): GLUCAP in the last 168 hours. D-Dimer No results for input(s): DDIMER in the last 72 hours. Hgb A1c No results for input(s): HGBA1C in the last 72 hours. Lipid Profile No results for input(s): CHOL, HDL, LDLCALC, TRIG, CHOLHDL, LDLDIRECT in the last 72 hours. Thyroid function studies No results for input(s): TSH, T4TOTAL, T3FREE, THYROIDAB in the last 72 hours.  Invalid input(s): FREET3 Anemia work up No results for input(s): VITAMINB12, FOLATE, FERRITIN, TIBC, IRON, RETICCTPCT in the last 72 hours. Urinalysis    Component Value Date/Time   COLORURINE AMBER (A) 09/21/2017 1404   APPEARANCEUR CLOUDY (A) 09/21/2017 1404   LABSPEC 1.015 09/21/2017 1404   PHURINE 5.0 09/21/2017 1404   GLUCOSEU NEGATIVE 09/21/2017 1404   HGBUR SMALL (A) 09/21/2017 1404   BILIRUBINUR NEGATIVE 09/21/2017 1404   KETONESUR NEGATIVE 09/21/2017 1404   PROTEINUR 100 (A) 09/21/2017 1404   UROBILINOGEN 0.2 08/16/2014 0535   NITRITE NEGATIVE 09/21/2017 1404   LEUKOCYTESUR LARGE (A) 09/21/2017 1404   Sepsis Labs Invalid input(s): PROCALCITONIN,  WBC,  LACTICIDVEN Microbiology Recent Results (from the past 240 hour(s))  Culture, blood (routine x 2)     Status: None (Preliminary result)   Collection Time: 09/29/17  1:21 PM  Result Value Ref Range Status   Specimen Description BLOOD LEFT HAND  Final   Special Requests IN PEDIATRIC BOTTLE Blood Culture adequate volume  Final   Culture NO GROWTH 2 DAYS  Final   Report Status PENDING   Incomplete  Culture, blood (routine x 2)     Status: None (Preliminary result)   Collection Time: 09/29/17  9:10 PM  Result Value Ref Range Status   Specimen Description BLOOD RIGHT ARM  Final   Special Requests IN PEDIATRIC BOTTLE Blood Culture adequate volume  Final   Culture NO GROWTH 2 DAYS  Final   Report Status PENDING  Incomplete  Culture, body fluid-bottle  Status: None (Preliminary result)   Collection Time: 09/30/17 10:42 AM  Result Value Ref Range Status   Specimen Description FLUID RIGHT PLEURAL  Final   Special Requests NONE  Final   Culture NO GROWTH 1 DAY  Final   Report Status PENDING  Incomplete  Gram stain     Status: None   Collection Time: 09/30/17 10:42 AM  Result Value Ref Range Status   Specimen Description FLUID RIGHT PLEURAL  Final   Special Requests NONE  Final   Gram Stain   Final    MODERATE WBC PRESENT,BOTH PMN AND MONONUCLEAR NO ORGANISMS SEEN    Report Status 09/30/2017 FINAL  Final    Time coordinating discharge: 35 minutes  SIGNED:  Chipper Oman, MD  [facility] 10/02/2017, 11:24 AM  Pager please text page via  [email or web address] Password [email or web address]

## 2017-10-02 NOTE — Progress Notes (Signed)
Report called to Theresa at [facility]. 

## 2017-10-02 NOTE — Consult Note (Signed)
[facility] [employer]  Received request from [place] for family interest in [facility]. Chart reviewed and met with daughters Katharine Look and Vaughan Basta to complete registration paper work for transfer today. CSW [place] aware.  Please send discharge summary to [phone].  RN please call report to [phone].  Thank you,  Erling Conte, LCSW [phone]

## 2017-10-02 NOTE — Progress Notes (Signed)
Clinical Social Worker facilitated patient discharge including contacting patient family and facility to confirm patient discharge plans.  Clinical information faxed to facility and family agreeable with plan.  CSW arranged ambulance transport via [facility] to [facility] .  RN Lauren to call [phone] for  report prior to discharge.  Clinical Social Worker will sign off for now as social work intervention is no longer needed. Please consult [place] again if new need arises.  Rhea Pink, MSW, [place]

## 2017-10-02 NOTE — Progress Notes (Signed)
[facility] Brief Note  Pt going to [facility] No further renal input required  Jamal Maes, MD [place] Pager 10/02/2017, 12:55 PM

## 2017-10-04 LAB — CULTURE, BLOOD (ROUTINE X 2)
Culture: NO GROWTH
Culture: NO GROWTH
Special Requests: ADEQUATE
Special Requests: ADEQUATE

## 2017-10-05 LAB — CULTURE, BODY FLUID W GRAM STAIN -BOTTLE: Culture: NO GROWTH

## 2017-10-05 LAB — CHOLESTEROL, BODY FLUID: CHOL FL: 25 mg/dL

## 2017-10-05 LAB — CULTURE, BODY FLUID-BOTTLE

## 2017-10-27 DEATH — deceased

## 2017-11-11 ENCOUNTER — Ambulatory Visit: Payer: Medicare Other | Admitting: Cardiovascular Disease

## 2018-07-12 IMAGING — CT CT ABD-PELV W/O CM
2 of 4 series · 12 of 36 positions shown, 15 images · non-contrast
Comparison: Chest CT 01/01/2017 and PET-CT 09/08/2016

CLINICAL DATA: Chest pain and shortness-of-breath we fusion
suspected. History of breast cancer. Right lower lung mass and left
ovarian mass.

EXAM:
CT CHEST, ABDOMEN AND PELVIS WITHOUT CONTRAST
TECHNIQUE: Multidetector CT imaging of the chest, abdomen and pelvis was
performed following the standard protocol without IV contrast.

[Series 3: cap wo 5.0 i31f 2 · axial · 0.93mm/px · z∈[+785,+1325]mm · 9 of 128 slices shown, 12 images]
[im 10/128  mediastinal]
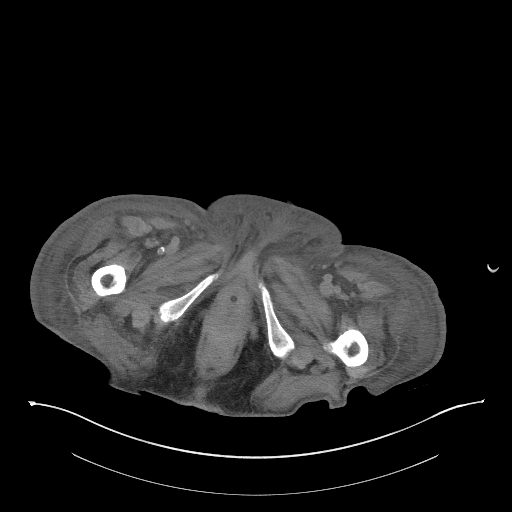
[im 10/128  lung]
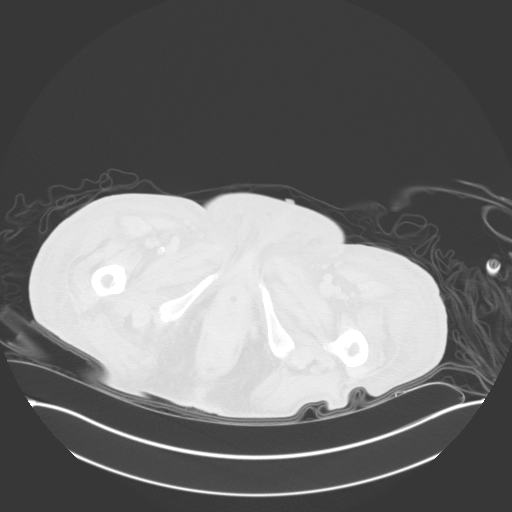
[im 30/128  lung]
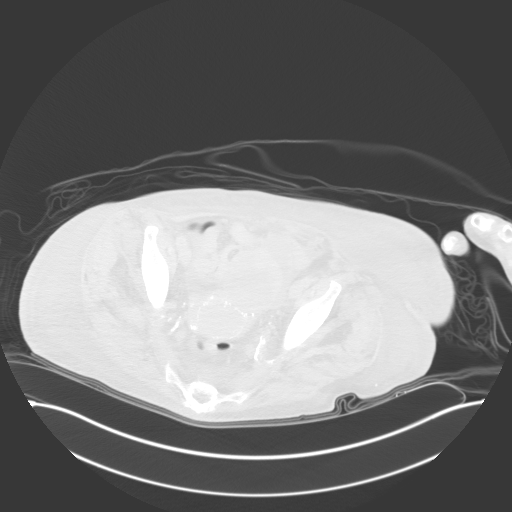
[im 40/128  lung]
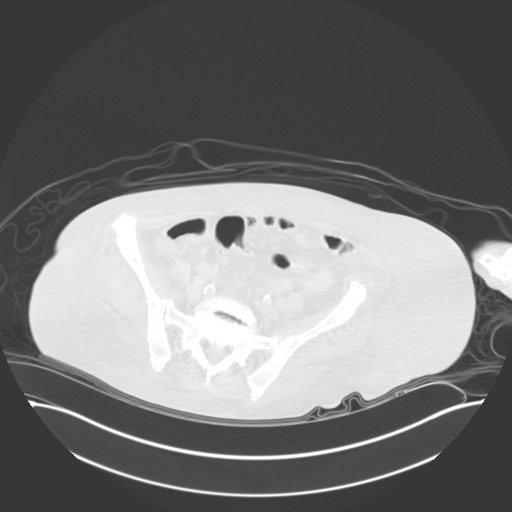
[im 49/128  lung]
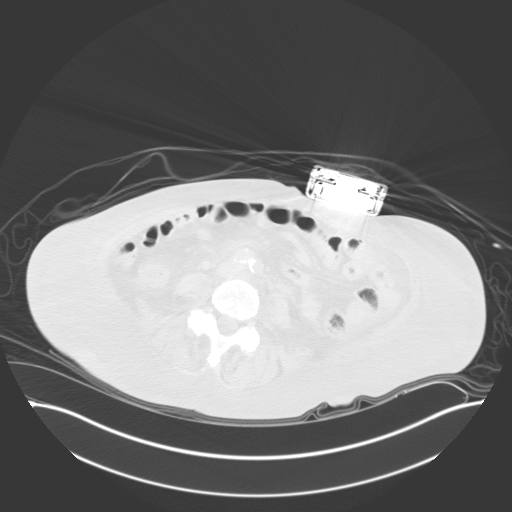
[im 69/128  mediastinal]
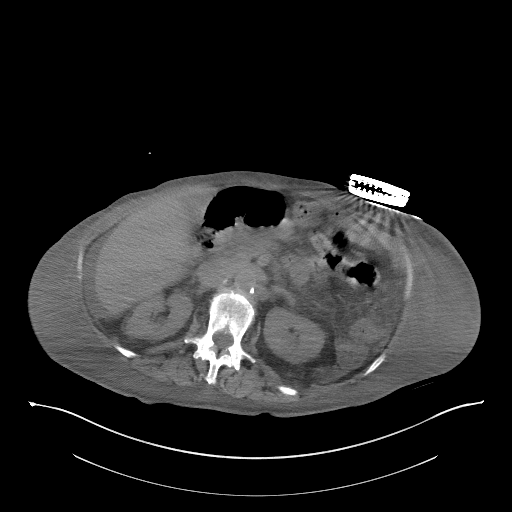
[im 69/128  lung]
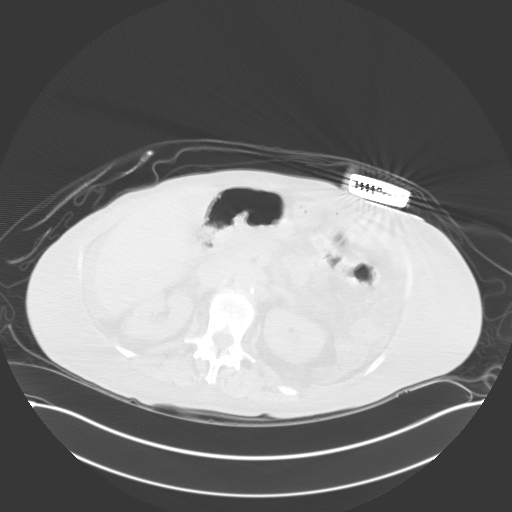
[im 79/128  lung]
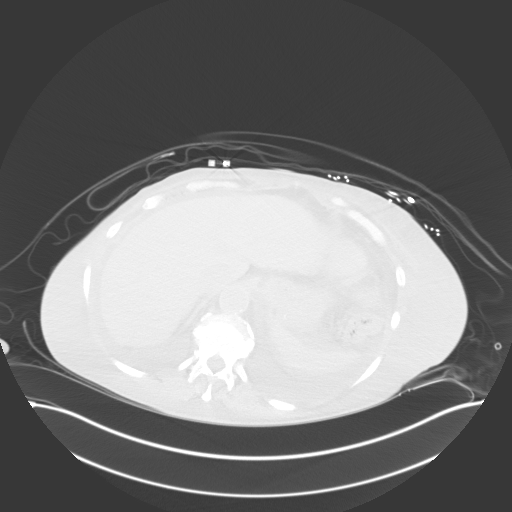
[im 88/128  lung]
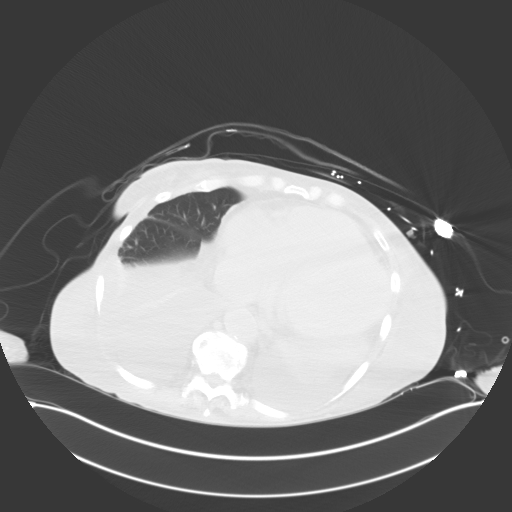
[im 108/128  lung]
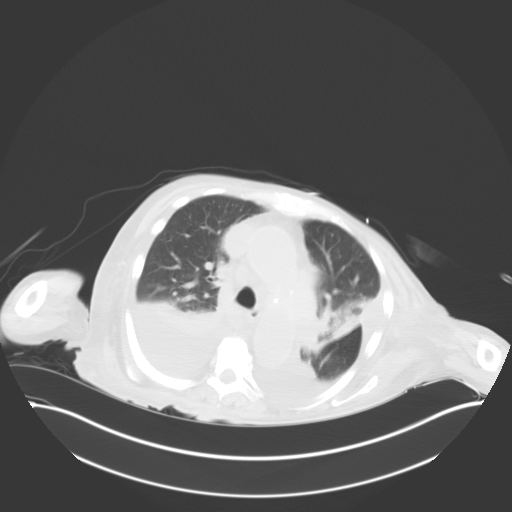
[im 118/128  mediastinal]
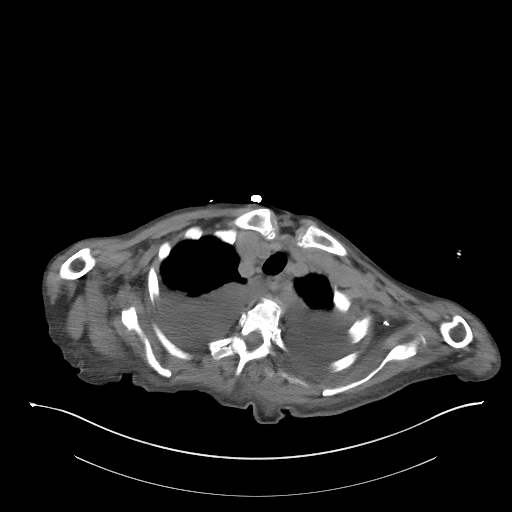
[im 118/128  lung]
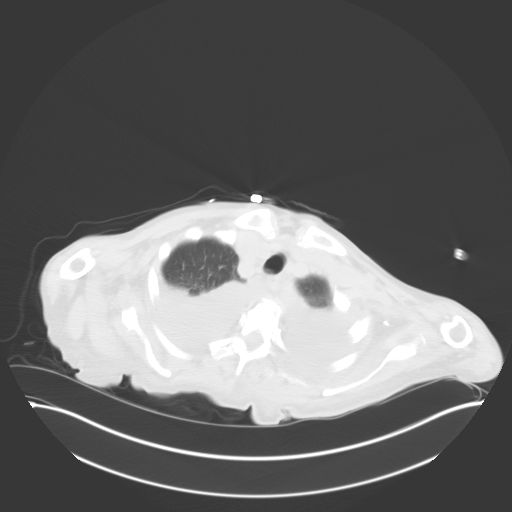

[Series 6: coronal · coronal · 0.84mm/px · 3 of 128 slices shown]
[im 26/128  lung]
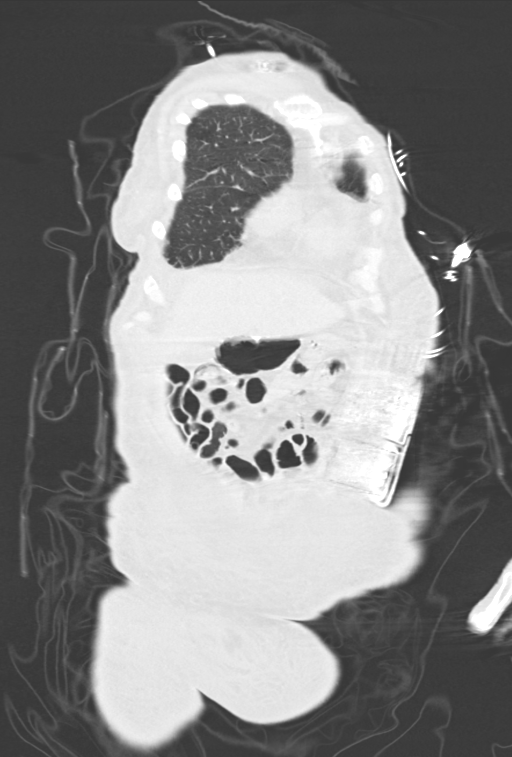
[im 51/128  lung]
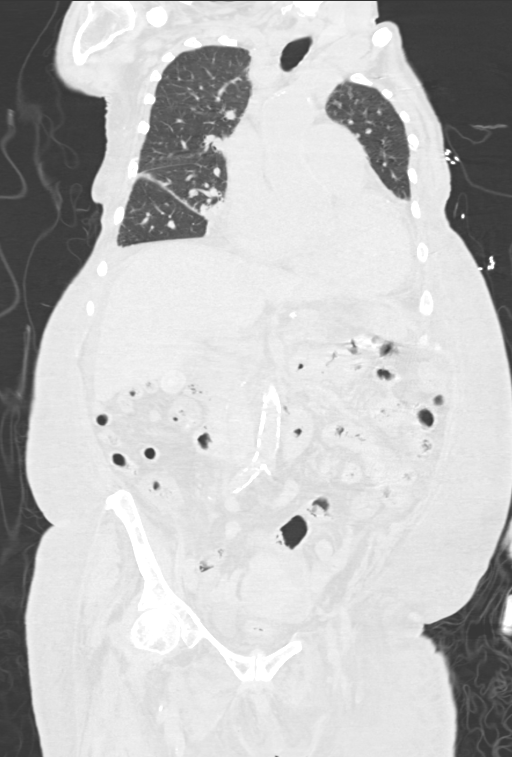
[im 77/128  lung]
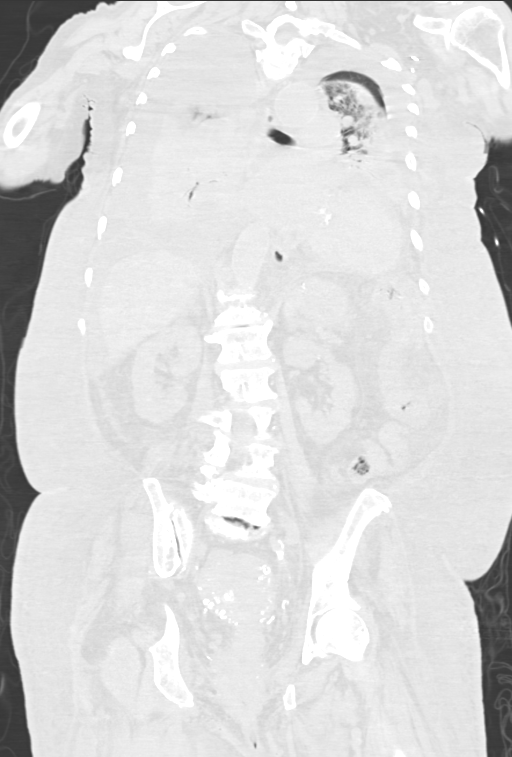

[12 of 36 positions shown; findings below may reference images not displayed]

FINDINGS: CT CHEST FINDINGS

Cardiovascular: Stable cardiomegaly. Ascending thoracic aorta
measures 4.8 cm in AP diameter slightly larger (previously 4.6 cm).
Mild prominence of the main pulmonary arteries. Mild calcified
plaque over the thoracic aorta

Mediastinum/Nodes: 1.3 cm precarinal lymph node. Difficult to assess
for hilar adenopathy on this noncontrast study. Remaining
mediastinal structures are unremarkable.

Lungs/Pleura: Examination demonstrates a large right pleural
effusion and moderate left pleural effusion with associated basilar
atelectasis. Cannot exclude infection over the left base. Dependent
atelectasis over the left upper lobe. Evidence of patient's known
right lower lobe lung mass with interval increase in size measuring
approximately 3.7 x 5.0 cm, although margins difficult to identify
on this noncontrast study. Airways otherwise unremarkable.

CT ABDOMEN PELVIS FINDINGS

Hepatobiliary: Minimal nodular contour to the liver. Moderate
cholelithiasis with a 2.5 cm stone. Biliary tree within normal.

Pancreas: Unremarkable.

Spleen: Unremarkable.

Adrenals/Urinary Tract: Adrenal glands are within normal. Kidneys
are normal in size without hydronephrosis or nephrolithiasis. 1.5 cm
cyst over the mid pole left kidney. Subcentimeter hypodensity over
the mid pole right kidney too small to characterize but likely a
cyst. Subcentimeter hyperdensity over the lower pole cortex of the
right kidney likely a hyperdense cysts and not significantly
changed. Foley catheter is present within a collapsed bladder.

Stomach/Bowel: Stomach and small bowel are within normal. Appendix
is within normal. There is mild wall thickening and mild adjacent
inflammatory change involving the descending colon in the left mid
abdomen likely mild acute colitis.

Vascular/Lymphatic: Moderate calcified plaque of the abdominal aorta
and iliac arteries. No definite adenopathy.

Reproductive: Uterus within normal with a few calcified fibroids
present. Interval enlargement of patient's known suspicious left
ovarian mass measuring 5.6 x 5.9 cm likely primary neoplasm.

Other: Minimal free fluid in the pelvis. Mild ascites. Diffuse
subcutaneous edema is present.

Musculoskeletal: Degenerate change throughout the spine with
degenerative change of the hips. No focal lytic or sclerotic
lesions.
IMPRESSION: Large right effusion and moderate size left effusion with associated
basilar atelectasis and posterior dependent left upper lobe
atelectasis. Cannot exclude infection in the left base. Interval
enlargement of known medial right lower lobe mass measuring
approximately 3.7 x 5.0 cm likely primary bronchogenic neoplasm.

Mild wall thickening of the descending colon with adjacent mild
inflammatory change as findings may be due to mild acute colitis of
infectious or inflammatory nature.

Interval enlargement of solid heterogeneous left ovarian mass
measuring 5.6 x 5.9 cm likely primary ovarian neoplasm. Mild
ascites.

Aneurysmal dilatation of the ascending thoracic aorta measuring
cm (previously 4.6 cm. Recommend semi-annual imaging followup by CTA
or MRA and referral to cardiothoracic surgery if not already
obtained. This recommendation follows 8262
ACCF/AHA/AATS/ACR/ASA/SCA/SUGAPE/JHON TERRY/NEYTIEL/SKRIPECZKY Guidelines for the
Diagnosis and Management of Patients With Thoracic Aortic Disease.
Circulation. 8262; 121: e266-e36.

Cholelithiasis.

Renal cysts. 1 cm hyperdense right renal cortical lesion likely a
hyperdense cysts and not significantly changed.

Aortic Atherosclerosis ([id number].R).

Stable cardiomegaly.

## 2018-07-13 IMAGING — US IR THORACENTESIS ASP PLEURAL SPACE W/IMG GUIDE
1 series · 3 of 3 positions shown · non-contrast
Comparison: none

INDICATION: Shortness of breath. Right-sided pleural effusion. Request
diagnostic and therapeutic thoracentesis.

[Series 1: ir (id) (id)/(id)/(id) ir · 3 of 3 slices shown]
[im 1/3]
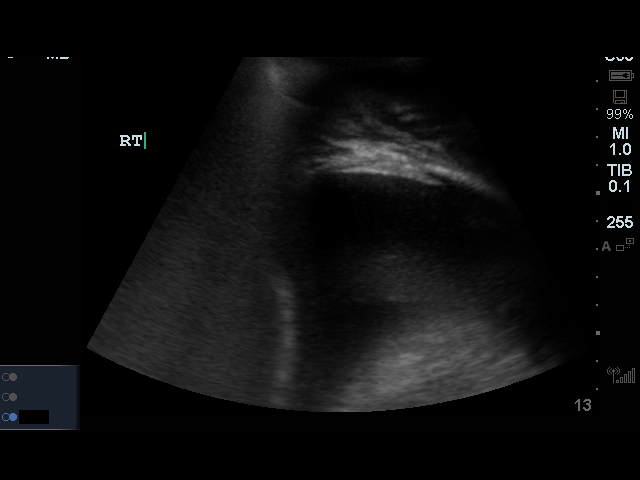
[im 2/3]
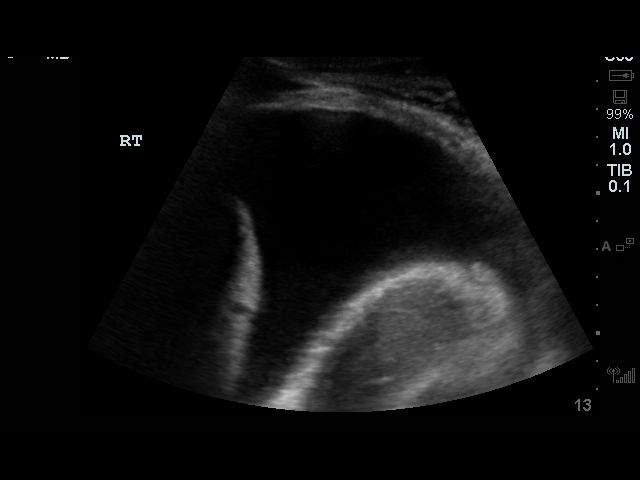
[im 3/3]
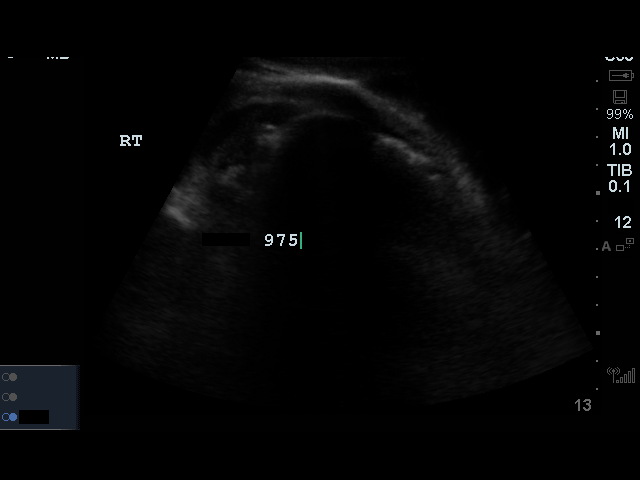

[3 of 3 positions shown; findings below may reference images not displayed]

EXAM:
ULTRASOUND GUIDED RIGHT THORACENTESIS

MEDICATIONS:
None.

COMPLICATIONS:
None immediate.

PROCEDURE:
An ultrasound guided thoracentesis was thoroughly discussed with the
patient and questions answered. The benefits, risks, alternatives
and complications were also discussed. The patient understands and
wishes to proceed with the procedure. Written consent was obtained.

Ultrasound was performed to localize and mark an adequate pocket of
fluid in the right chest. The area was then prepped and draped in
the normal sterile fashion. 1% Lidocaine was used for local
anesthesia. Under ultrasound guidance a Safe-T-Centesis catheter was
introduced. Thoracentesis was performed. The catheter was removed
and a dressing applied.
FINDINGS: A total of approximately 975 mL of clear yellow fluid was removed.
Samples were sent to the laboratory as requested by the clinical
team.
IMPRESSION: Successful ultrasound guided right thoracentesis yielding 975 mL of
pleural fluid.

## 2018-07-27 IMAGING — US US RENAL
1 series · 14 of 25 positions shown · non-contrast
Comparison: CT abdomen pelvis dated May 20, 2017.

CLINICAL DATA: Renal failure.

EXAM:
RENAL / URINARY TRACT ULTRASOUND COMPLETE

[Series 1: us renal · 0.23mm/px · 14 of 37 slices shown]
[im 1/37]
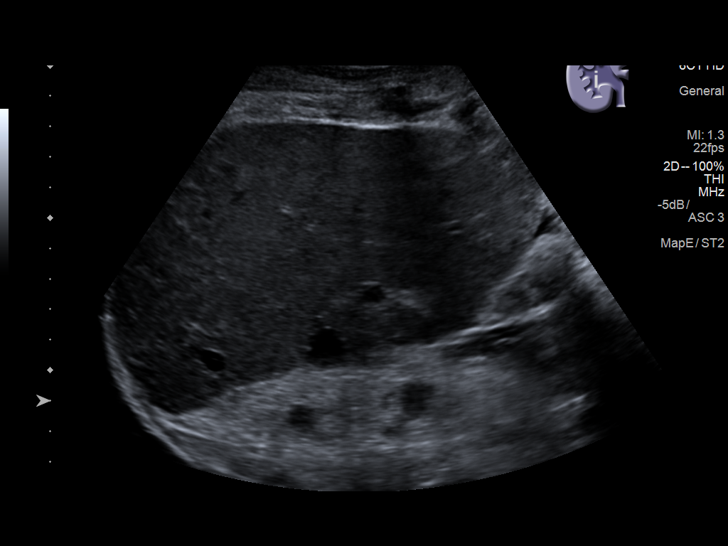
[im 4/37]
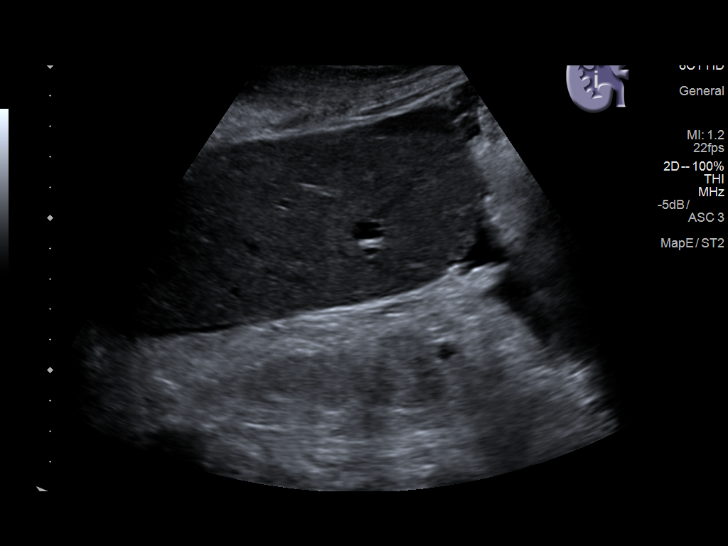
[im 7/37]
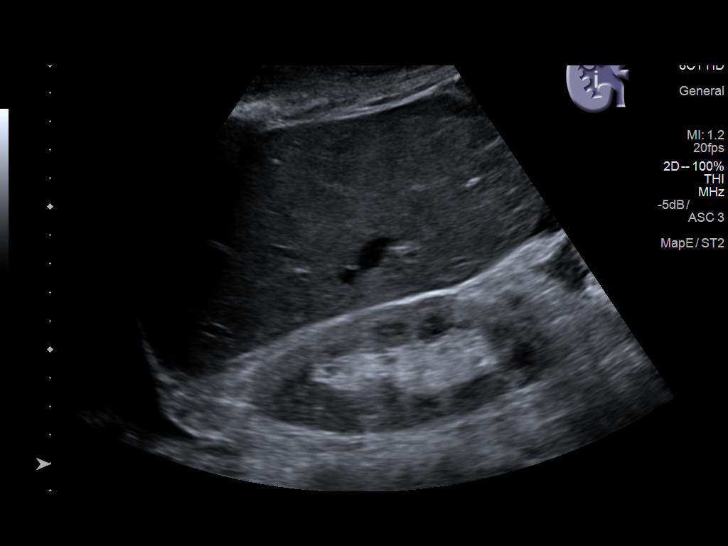
[im 10/37]
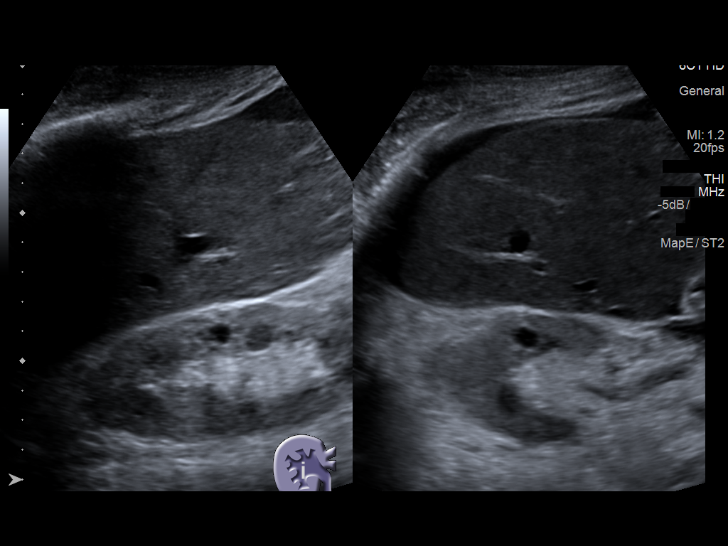
[im 13/37]
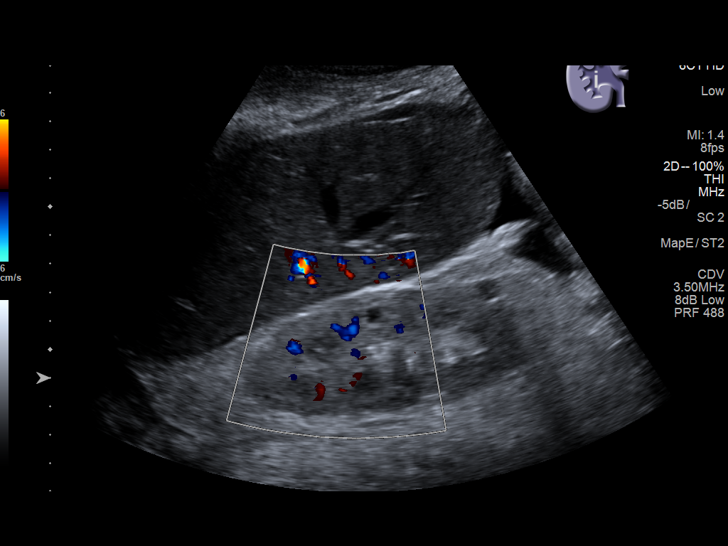
[im 14/37]
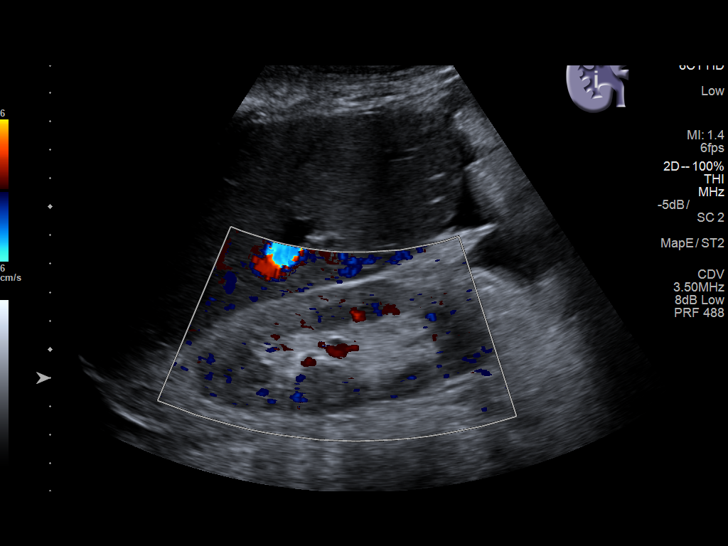
[im 17/37]
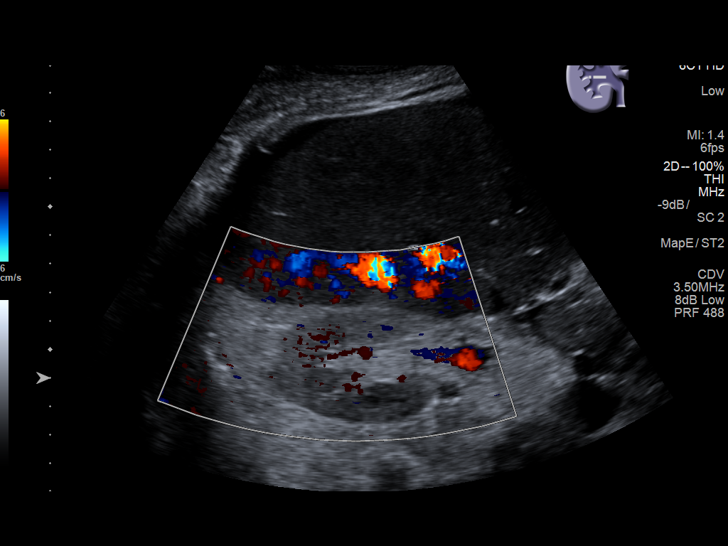
[im 20/37]
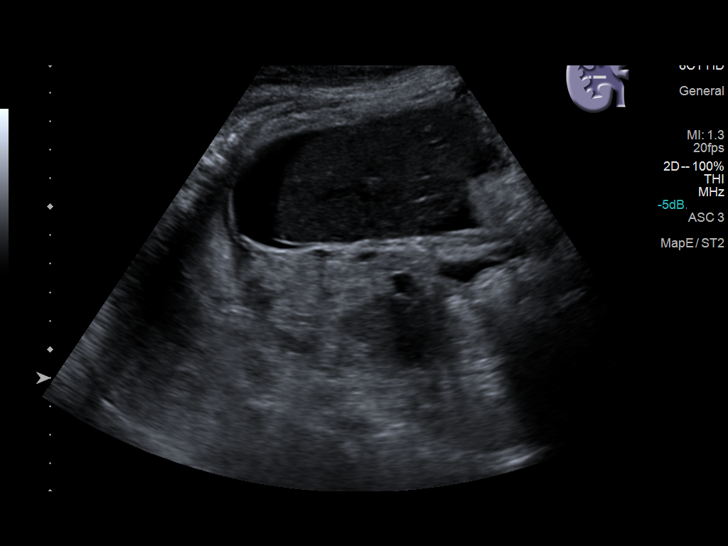
[im 23/37]
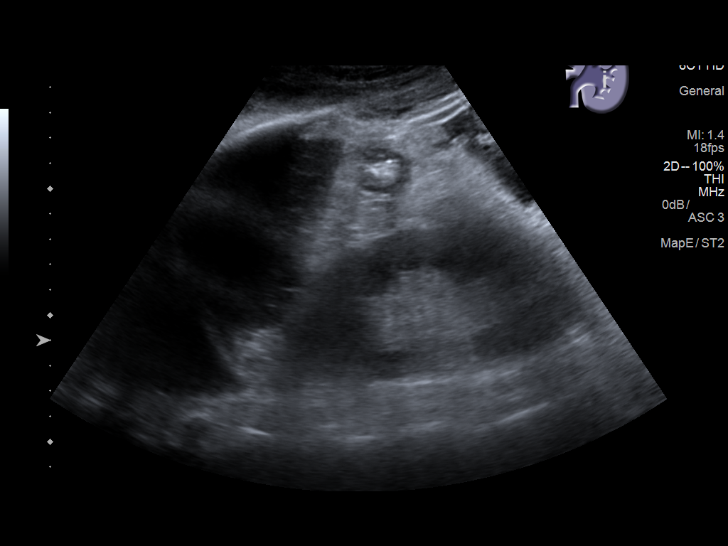
[im 25/37]
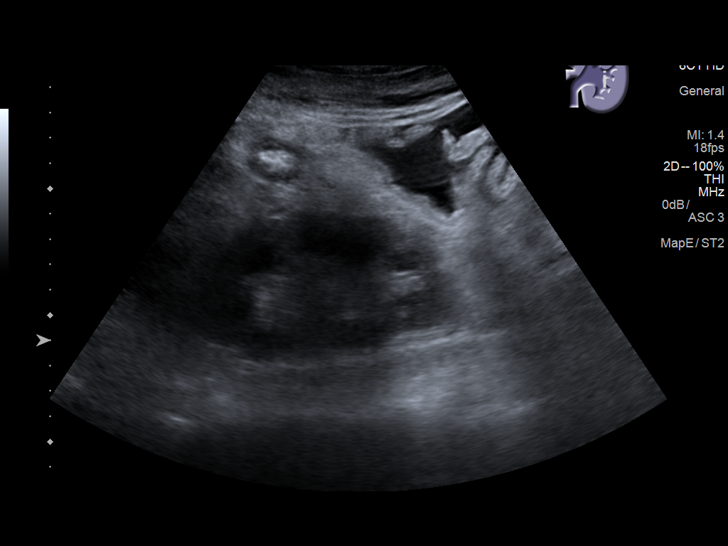
[im 28/37]
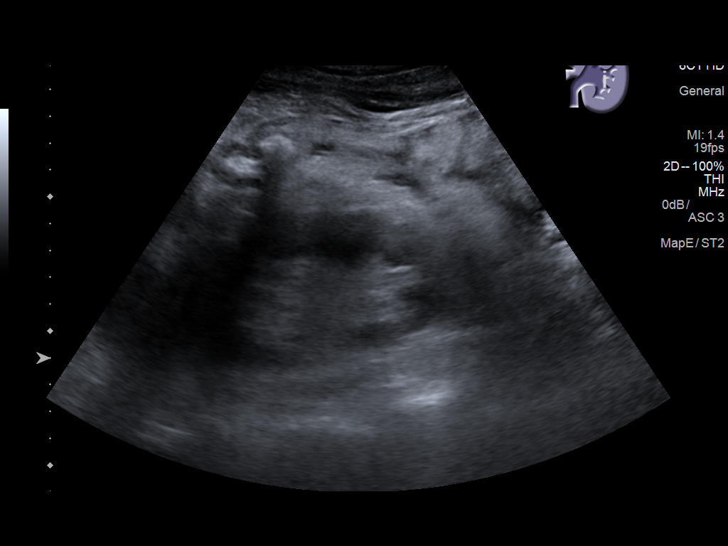
[im 31/37]
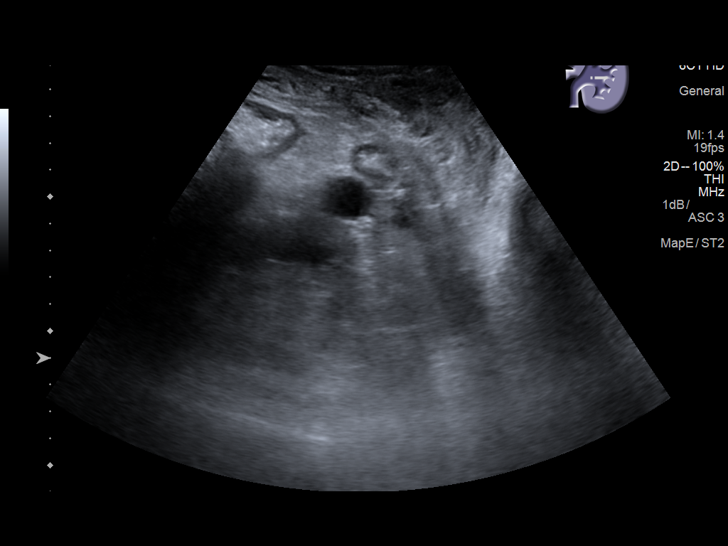
[im 34/37]
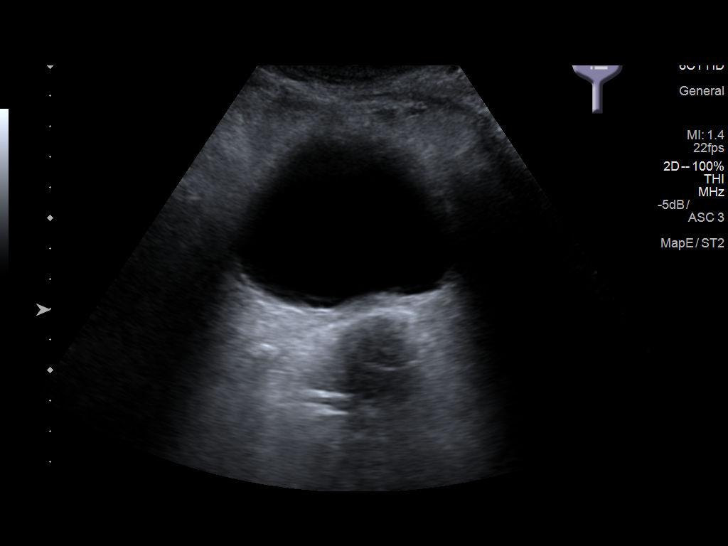
[im 37/37]
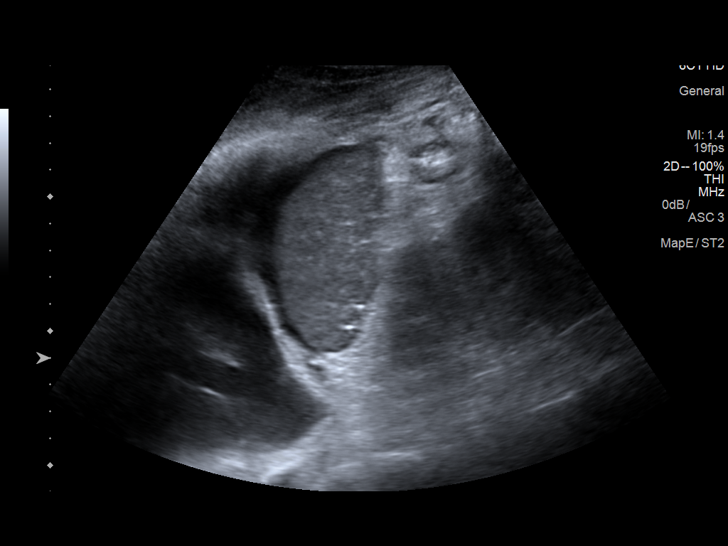

[14 of 25 positions shown; findings below may reference images not displayed]

FINDINGS: Right Kidney:

Length: 11.5 cm. Echogenicity within normal limits. No mass or
hydronephrosis visualized. An 8 mm simple cyst is seen in the
midpole. There is a 9 mm lower pole cystic lesion demonstrating low
level internal echogenicity, consistent with a complicated cyst as
seen on prior CT.

Left Kidney:

Length: 12.1 cm. Echogenicity within normal limits. No mass or
hydronephrosis visualized. There is a 2.5 cm exophytic simple cyst
arising from the upper pole.

Bladder:

Appears normal for degree of bladder distention.

Bilateral pleural effusions and small ascites are noted.
IMPRESSION: 1. No acute renal abnormality.
2. Bilateral pleural effusions and small ascites.
